# Patient Record
Sex: Male | Born: 1935 | Race: White | Hispanic: No | Smoking: Never smoker
Health system: Southern US, Community
[De-identification: ages and names within clinical notes are randomized; demographics above are authoritative.]

## PROBLEM LIST (undated history)

## (undated) DIAGNOSIS — I639 Cerebral infarction, unspecified: Secondary | ICD-10-CM

## (undated) DIAGNOSIS — F1010 Alcohol abuse, uncomplicated: Secondary | ICD-10-CM

## (undated) DIAGNOSIS — C61 Malignant neoplasm of prostate: Secondary | ICD-10-CM

## (undated) DIAGNOSIS — E7800 Pure hypercholesterolemia, unspecified: Secondary | ICD-10-CM

## (undated) DIAGNOSIS — I4891 Unspecified atrial fibrillation: Secondary | ICD-10-CM

## (undated) DIAGNOSIS — K900 Celiac disease: Secondary | ICD-10-CM

## (undated) DIAGNOSIS — R9389 Abnormal findings on diagnostic imaging of other specified body structures: Secondary | ICD-10-CM

## (undated) DIAGNOSIS — R946 Abnormal results of thyroid function studies: Secondary | ICD-10-CM

## (undated) DIAGNOSIS — L239 Allergic contact dermatitis, unspecified cause: Secondary | ICD-10-CM

## (undated) DIAGNOSIS — M7020 Olecranon bursitis, unspecified elbow: Secondary | ICD-10-CM

## (undated) DIAGNOSIS — K649 Unspecified hemorrhoids: Secondary | ICD-10-CM

## (undated) DIAGNOSIS — D539 Nutritional anemia, unspecified: Secondary | ICD-10-CM

## (undated) DIAGNOSIS — M1990 Unspecified osteoarthritis, unspecified site: Secondary | ICD-10-CM

## (undated) DIAGNOSIS — K5900 Constipation, unspecified: Secondary | ICD-10-CM

## (undated) HISTORY — PX: COLONOSCOPY: SHX174

## (undated) HISTORY — DX: Nutritional anemia, unspecified: D53.9

## (undated) HISTORY — DX: Alcohol abuse, uncomplicated: F10.10

## (undated) HISTORY — DX: Malignant neoplasm of prostate: C61

## (undated) HISTORY — DX: Pure hypercholesterolemia, unspecified: E78.00

## (undated) HISTORY — DX: Celiac disease: K90.0

## (undated) HISTORY — DX: Unspecified hemorrhoids: K64.9

## (undated) HISTORY — DX: Cerebral infarction, unspecified: I63.9

## (undated) HISTORY — DX: Unspecified osteoarthritis, unspecified site: M19.90

## (undated) HISTORY — DX: Unspecified atrial fibrillation: I48.91

## (undated) HISTORY — PX: OTHER SURGICAL HISTORY: SHX169

## (undated) HISTORY — DX: Abnormal results of thyroid function studies: R94.6

## (undated) HISTORY — DX: Allergic contact dermatitis, unspecified cause: L23.9

## (undated) HISTORY — PX: WRIST FRACTURE SURGERY: SHX121

## (undated) HISTORY — DX: Olecranon bursitis, unspecified elbow: M70.20

## (undated) HISTORY — DX: Abnormal findings on diagnostic imaging of other specified body structures: R93.89

---

## 1985-12-20 DIAGNOSIS — I639 Cerebral infarction, unspecified: Secondary | ICD-10-CM

## 1985-12-20 HISTORY — DX: Cerebral infarction, unspecified: I63.9

## 1998-03-24 ENCOUNTER — Encounter (HOSPITAL_COMMUNITY): Admission: RE | Admit: 1998-03-24 | Discharge: 1998-06-22 | Payer: Self-pay | Admitting: Psychiatry

## 1998-10-22 ENCOUNTER — Ambulatory Visit (HOSPITAL_COMMUNITY): Admission: RE | Admit: 1998-10-22 | Discharge: 1998-10-22 | Payer: Self-pay | Admitting: Gastroenterology

## 2002-07-12 ENCOUNTER — Emergency Department (HOSPITAL_COMMUNITY): Admission: EM | Admit: 2002-07-12 | Discharge: 2002-07-12 | Payer: Self-pay | Admitting: Emergency Medicine

## 2004-03-26 ENCOUNTER — Encounter: Payer: Self-pay | Admitting: Gastroenterology

## 2005-04-27 ENCOUNTER — Ambulatory Visit: Payer: Self-pay | Admitting: Pulmonary Disease

## 2005-06-18 ENCOUNTER — Ambulatory Visit: Payer: Self-pay | Admitting: Pulmonary Disease

## 2006-08-02 ENCOUNTER — Ambulatory Visit: Payer: Self-pay | Admitting: Pulmonary Disease

## 2007-07-31 ENCOUNTER — Ambulatory Visit: Payer: Self-pay | Admitting: Pulmonary Disease

## 2007-07-31 LAB — CONVERTED CEMR LAB
ALT: 35 units/L (ref 0–53)
AST: 54 units/L — ABNORMAL HIGH (ref 0–37)
Alkaline Phosphatase: 59 units/L (ref 39–117)
BUN: 11 mg/dL (ref 6–23)
Basophils Relative: 0.4 % (ref 0.0–1.0)
Bilirubin Urine: NEGATIVE
Bilirubin, Direct: 0.2 mg/dL (ref 0.0–0.3)
CO2: 27 meq/L (ref 19–32)
Calcium: 8.7 mg/dL (ref 8.4–10.5)
Chloride: 105 meq/L (ref 96–112)
Eosinophils Relative: 2.3 % (ref 0.0–5.0)
GFR calc Af Amer: 106 mL/min
GFR calc non Af Amer: 88 mL/min
Glucose, Bld: 90 mg/dL (ref 70–99)
Ketones, ur: NEGATIVE mg/dL
LDL Cholesterol: 55 mg/dL (ref 0–99)
Lymphocytes Relative: 39.7 % (ref 12.0–46.0)
Monocytes Relative: 8.6 % (ref 3.0–11.0)
Neutro Abs: 2.8 10*3/uL (ref 1.4–7.7)
Nitrite: NEGATIVE
Platelets: 230 10*3/uL (ref 150–400)
RBC: 3.5 M/uL — ABNORMAL LOW (ref 4.22–5.81)
Specific Gravity, Urine: 1.02 (ref 1.000–1.03)
TSH: 3.32 microintl units/mL (ref 0.35–5.50)
Total CHOL/HDL Ratio: 2.1
Total Protein, Urine: NEGATIVE mg/dL
Triglycerides: 73 mg/dL (ref 0–149)
Urine Glucose: NEGATIVE mg/dL
VLDL: 15 mg/dL (ref 0–40)
WBC: 5.6 10*3/uL (ref 4.5–10.5)
pH: 6 (ref 5.0–8.0)

## 2007-08-03 ENCOUNTER — Ambulatory Visit: Payer: Self-pay | Admitting: Pulmonary Disease

## 2008-11-26 DIAGNOSIS — I6350 Cerebral infarction due to unspecified occlusion or stenosis of unspecified cerebral artery: Secondary | ICD-10-CM | POA: Insufficient documentation

## 2008-11-26 DIAGNOSIS — Z8546 Personal history of malignant neoplasm of prostate: Secondary | ICD-10-CM | POA: Insufficient documentation

## 2008-11-26 DIAGNOSIS — E785 Hyperlipidemia, unspecified: Secondary | ICD-10-CM | POA: Insufficient documentation

## 2008-11-26 DIAGNOSIS — L130 Dermatitis herpetiformis: Secondary | ICD-10-CM | POA: Insufficient documentation

## 2008-11-27 ENCOUNTER — Ambulatory Visit: Payer: Self-pay | Admitting: Pulmonary Disease

## 2008-12-08 LAB — CONVERTED CEMR LAB
AST: 33 units/L (ref 0–37)
Alkaline Phosphatase: 47 units/L (ref 39–117)
Basophils Absolute: 0 10*3/uL (ref 0.0–0.1)
Bilirubin, Direct: 0.1 mg/dL (ref 0.0–0.3)
CO2: 30 meq/L (ref 19–32)
Chloride: 105 meq/L (ref 96–112)
Cholesterol: 152 mg/dL (ref 0–200)
Creatinine, Ser: 0.8 mg/dL (ref 0.4–1.5)
Eosinophils Absolute: 0.1 10*3/uL (ref 0.0–0.7)
GFR calc non Af Amer: 100 mL/min
HDL: 46 mg/dL (ref >39.0)
LDL Cholesterol: 67 mg/dL (ref 0–99)
Lymphocytes Relative: 49.3 % — ABNORMAL HIGH (ref 12.0–46.0)
MCHC: 33.6 g/dL (ref 30.0–36.0)
MCV: 106.2 fL — ABNORMAL HIGH (ref 78.0–100.0)
Neutrophils Relative %: 37.5 % — ABNORMAL LOW (ref 43.0–77.0)
Platelets: 195 10*3/uL (ref 150–400)
Potassium: 4.5 meq/L (ref 3.5–5.1)
RDW: 12.5 % (ref 11.5–14.6)
Sodium: 141 meq/L (ref 135–145)
Total Bilirubin: 0.7 mg/dL (ref 0.3–1.2)
VLDL: 39 mg/dL (ref 0–40)
Vitamin B-12: 306 pg/mL (ref 211–911)

## 2008-12-22 DIAGNOSIS — K649 Unspecified hemorrhoids: Secondary | ICD-10-CM | POA: Insufficient documentation

## 2008-12-22 DIAGNOSIS — M7020 Olecranon bursitis, unspecified elbow: Secondary | ICD-10-CM

## 2008-12-22 DIAGNOSIS — K900 Celiac disease: Secondary | ICD-10-CM

## 2008-12-22 DIAGNOSIS — Z8669 Personal history of other diseases of the nervous system and sense organs: Secondary | ICD-10-CM

## 2008-12-22 DIAGNOSIS — R9389 Abnormal findings on diagnostic imaging of other specified body structures: Secondary | ICD-10-CM | POA: Insufficient documentation

## 2008-12-22 DIAGNOSIS — M1990 Unspecified osteoarthritis, unspecified site: Secondary | ICD-10-CM | POA: Insufficient documentation

## 2008-12-22 DIAGNOSIS — F1010 Alcohol abuse, uncomplicated: Secondary | ICD-10-CM | POA: Insufficient documentation

## 2009-02-26 ENCOUNTER — Encounter (INDEPENDENT_AMBULATORY_CARE_PROVIDER_SITE_OTHER): Payer: Self-pay | Admitting: *Deleted

## 2009-06-26 ENCOUNTER — Emergency Department (HOSPITAL_COMMUNITY): Admission: EM | Admit: 2009-06-26 | Discharge: 2009-06-26 | Payer: Self-pay | Admitting: Orthopaedic Surgery

## 2009-07-02 ENCOUNTER — Emergency Department (HOSPITAL_COMMUNITY): Admission: EM | Admit: 2009-07-02 | Discharge: 2009-07-02 | Payer: Self-pay | Admitting: Emergency Medicine

## 2010-01-26 ENCOUNTER — Telehealth (INDEPENDENT_AMBULATORY_CARE_PROVIDER_SITE_OTHER): Payer: Self-pay | Admitting: *Deleted

## 2010-01-27 ENCOUNTER — Telehealth: Payer: Self-pay | Admitting: Pulmonary Disease

## 2010-01-28 ENCOUNTER — Ambulatory Visit: Payer: Self-pay | Admitting: Pulmonary Disease

## 2010-02-05 ENCOUNTER — Telehealth: Payer: Self-pay | Admitting: Gastroenterology

## 2010-02-06 ENCOUNTER — Ambulatory Visit: Payer: Self-pay | Admitting: Pulmonary Disease

## 2010-02-06 DIAGNOSIS — D539 Nutritional anemia, unspecified: Secondary | ICD-10-CM | POA: Insufficient documentation

## 2010-02-07 LAB — CONVERTED CEMR LAB
ALT: 25 units/L (ref 0–53)
AST: 38 units/L — ABNORMAL HIGH (ref 0–37)
Albumin: 3.8 g/dL (ref 3.5–5.2)
BUN: 16 mg/dL (ref 6–23)
Basophils Relative: 0.5 % (ref 0.0–3.0)
Chloride: 101 meq/L (ref 96–112)
Eosinophils Relative: 2.2 % (ref 0.0–5.0)
Eosinophils Relative: 2.6 % (ref 0.0–5.0)
Folate: 19.4 ng/mL
GFR calc non Af Amer: 99.51 mL/min (ref >60)
Glucose, Bld: 100 mg/dL — ABNORMAL HIGH (ref 70–99)
HCT: 35.3 % — ABNORMAL LOW (ref 39.0–52.0)
Hemoglobin: 10.7 g/dL — ABNORMAL LOW (ref 13.0–17.0)
Hemoglobin: 11.7 g/dL — ABNORMAL LOW (ref 13.0–17.0)
Iron: 155 ug/dL (ref 42–165)
Lymphs Abs: 1.5 10*3/uL (ref 0.7–4.0)
MCV: 108.4 fL — ABNORMAL HIGH (ref 78.0–100.0)
Monocytes Absolute: 0.4 10*3/uL (ref 0.1–1.0)
Monocytes Relative: 9.3 % (ref 3.0–12.0)
Neutro Abs: 2 10*3/uL (ref 1.4–7.7)
Neutro Abs: 1.7 10*3/uL (ref 1.4–7.7)
Neutrophils Relative %: 45.9 % (ref 43.0–77.0)
Potassium: 5 meq/L (ref 3.5–5.1)
RBC: 2.96 M/uL — ABNORMAL LOW (ref 4.22–5.81)
RBC: 3.22 M/uL — ABNORMAL LOW (ref 4.22–5.81)
TSH: 5.94 microintl units/mL — ABNORMAL HIGH (ref 0.35–5.50)
Total CHOL/HDL Ratio: 2
Vitamin B-12: 264 pg/mL (ref 211–911)
WBC: 4.2 10*3/uL — ABNORMAL LOW (ref 4.5–10.5)
WBC: 3.6 10*3/uL — ABNORMAL LOW (ref 4.5–10.5)

## 2010-02-13 ENCOUNTER — Telehealth: Payer: Self-pay | Admitting: Gastroenterology

## 2010-02-13 DIAGNOSIS — D649 Anemia, unspecified: Secondary | ICD-10-CM

## 2010-02-17 ENCOUNTER — Encounter: Payer: Self-pay | Admitting: Nurse Practitioner

## 2010-02-17 ENCOUNTER — Ambulatory Visit: Payer: Self-pay | Admitting: Internal Medicine

## 2010-02-18 ENCOUNTER — Ambulatory Visit: Payer: Self-pay | Admitting: Gastroenterology

## 2010-02-18 LAB — CONVERTED CEMR LAB
Fecal Occult Blood: NEGATIVE
OCCULT 2: NEGATIVE
OCCULT 3: NEGATIVE

## 2010-02-26 LAB — CONVERTED CEMR LAB
Albumin ELP: 59.3 % (ref 55.8–66.1)
Alpha-1-Globulin: 3.7 % (ref 2.9–4.9)

## 2010-03-09 ENCOUNTER — Encounter (INDEPENDENT_AMBULATORY_CARE_PROVIDER_SITE_OTHER): Payer: Self-pay | Admitting: *Deleted

## 2010-03-10 ENCOUNTER — Ambulatory Visit: Payer: Self-pay | Admitting: Gastroenterology

## 2010-03-20 ENCOUNTER — Ambulatory Visit: Payer: Self-pay | Admitting: Gastroenterology

## 2010-03-24 ENCOUNTER — Encounter: Payer: Self-pay | Admitting: Gastroenterology

## 2011-01-19 NOTE — Procedures (Signed)
Summary: Colonoscopy  Patient: [name] Note: All result statuses are Final unless otherwise noted.  Tests: (1) Colonoscopy (COL)   COL Colonoscopy           [facility] [employer].     [place], [place]  [id number]           COLONOSCOPY PROCEDURE REPORT           PATIENT:  [name], [name]  MR#:  [id number]     BIRTHDATE:  1930-01-20, 79 yrs. old  GENDER:  male           ENDOSCOPIST:  Norberto Sorenson T. Fuller Plan, MD, [facility]           PROCEDURE DATE:  03/20/2010     PROCEDURE:  Colonoscopy with snare polypectomy     ASA CLASS:  Class II     INDICATIONS:  1) anemia 2) hx of prostate cancer.           MEDICATIONS:   Fentanyl 100 mcg IV, Versed 10 mg IV, Benadryl 25     mg IV           DESCRIPTION OF PROCEDURE:   After the risks benefits and     alternatives of the procedure were thoroughly explained, informed     consent was obtained.  Digital rectal exam was performed and     revealed no abnormalities.   The LB [id number] endoscope     was introduced through the anus and advanced to the cecum, which     was identified by both the appendix and ileocecal valve, without     limitations.  The quality of the prep was good, using MoviPrep.     The instrument was then slowly withdrawn as the colon was fully     examined.     <<PROCEDUREIMAGES>>           FINDINGS:  A sessile polyp was found in the cecum. It was 5 mm in     size. Polyp was snared without cautery. Retrieval was successful.     A normal appearing ileocecal valve, and appendiceal orifice were     identified. The ascending, hepatic flexure, transverse, splenic     flexure, descending, sigmoid colon, and rectum appeared     unremarkable. There were mucosal changes consistent with radiation     proctitis seen in the distal rectum. They were mild.   Retroflexed     views in the rectum revealed internal hemorrhoids, small and     radiation proctitis.  The time to cecum =  2.5  minutes. The scope  was then withdrawn (time =  10  min) from the patient and the     procedure completed.           COMPLICATIONS:  None           ENDOSCOPIC IMPRESSION:     1) 5 mm sessile polyp in the cecum     2) Radiation proctitis     3) Internal hemorrhoids           RECOMMENDATIONS:     1) Await pathology results     2) If the polyp removed today is adenomatous (pre-cancerous),     you will need a repeat colonoscopy in 5 years if your health     status is appropriate. Otherwise there would be no plans for     future surveillance colonoscopies given your age.  Pricilla Riffle. Fuller Plan, MD, [facility]           CC: Noralee Space, MD           n.     Lorrin MaisPricilla Riffle. Vieno Tarrant at 03/20/2010 04:15 PM           Lennox Grumbles, [phone]  Note: An exclamation mark (!) indicates a result that was not dispersed into the flowsheet. Document Creation Date: 03/20/2010 4:16 PM _______________________________________________________________________  (1) Order result status: Final Collection or observation date-time: 03/20/2010 16:00 Requested date-time:  Receipt date-time:  Reported date-time:  Referring Physician:   Ordering Physician: Lucio Edward [phone]) Specimen Source:  Source: Tawanna Cooler Order Number: [id number] Lab site:

## 2011-01-19 NOTE — Assessment & Plan Note (Signed)
Summary: cpx/fasting/apc   CC:  14 month ROV & review of mult medical problems....  History of Present Illness: 77 y/o WM here for a follow up visit... he has multiple medical problems as noted below...     ~  February 06, 2010:  he states that he has had a good yr but has lost 14# and appeitite diminished ever since he got some false teeth... he had shingles eruption involving the left T10 dermatome about 2 mo ago & was treated at an [facility] w/ ?Famvir & resolved... he requests refill perscriptions and declines Flu vaccine- "I had the last one in 1984"...   ** labs show anemia w/ large cells- & we will eval further >>    Current Problems:   Hx of ECHOCARDIOGRAM, ABNORMAL (ICD-793.2) - prev 2DEcho w/ ? old vegetation of non-coronary cusp of AoV w/ discete non-mobile nodule felt to be a healed vegetation seen on Echo in 1988 by DrHSmith...  he denies CP, palpit, syncope, edema, etc...  ~  last 2DEcho 1998 & read by Benay Spice w/ AoV sclerosis & thickening of the non-coronary cusp, w/o stenosis or AI...  HYPERCHOLESTEROLEMIA (ICD-272.0) - on LIPITOR 61m/d...  ~  [place]/08 showed TChol 133, TG 73, HDL 63, LDL 55... rec- continue Lip20 + diet.  ~  FPar[place]12/09 showed TChol 152, TG 195, HDL 46, LDL 67... rec- same med + better low fat diet!  ~  FLP 2/11 showed TChol 115, TG 134, HDL 61, LDL 27... WOW!!!  ABNORMAL THYROID FUNCTION TESTS (ICD-794.5) - clinically euthyroid w/o complaints.  ~  labs 12/09 showed TSH= 4.82  ~  labs 2/11 showed TSH= 5.94... offered Synthroid Rx but he wants to wait & repeat blood work later.  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 on gluten free diet...   Hx of HEMORRHOIDS (ICD-455.6) - he [facility]um in [facility] revealing radiation proctitis from his seed implants... last colonoscopy 4/05 by DrStark showed some mild, inactive radiation colitis and internal hems... f/u planned  538yr..  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  ~  labs 8/08 showed SGOT= 54  ~  labs 12/09 showed SGOT= 33  ~  labs 2/11 showed SGOT= 38  PROSTATE CANCER, HX OF (ICD-V10.46) - eval and Rx by DrPeterson in 1998 w/ "seeds"... he is checked yearly in the summer months...   ~  labs 8/08 showed PSA= 0.36  ~  labs 12/09 showed PSA= 0.27  ~  labs 2/11 showed PSA= 0.26  DEGENERATIVE JOINT DISEASE (ICD-715.90)  Hx of OLECRANON BURSITIS (ICD-726.33)  CARPAL TUNNEL SYNDROME, HX OF (ICD-V12.49)  CVA (ICD-434.91) - he takes ASA 8133m... had TIA/ CVA [place] in [place] in the 1980's... no recurrent problems since that time...  DERMATITIS, ALLERGIC (ICD-692.9) - hx dermatitis: ?dermatitis herpetiformis vs gluten dermatits followed by DrHouston... pt states that he used to get a "peel" every 3 months... he is taking DAPSONE 100m36m/2 tab daily which he states he has been on for >67yr47yr rets us to[place]orea[place] for him...  ~  2/11: he reported a bout of Shingles  ~12/10 involving left T10[facility].  MACROCYTIC ANEMIA (ICD-281.9) --- he has macrocytosis w/ borderline B12, ?alcohol related vs other?  ~  labs 8/08 showed Hg=  12.5, MCV= 103, B12= 293 (211-911)...  ~  labs 12/09 showed Hg= 12.2, MCV= 106, B12 level 306 (211-911)... advised MVI, folic acid, etc...  ~  labs 2/11 showed Hg= 10.7 (repeat 11.7), MCV= 109, B12= 264, Folate= 19, Fe= 155, SPE/ IEP & colon pending.   **We discussed starting Vit B 12 supplement 10104mg daily...   Allergies (verified): No Known Drug Allergies  Comments:  Nurse/Medical Assistant: The patient's medications and allergies were reviewed with the patient and were updated in the Medication and Allergy Lists.  Past History:  Past Medical History:  Hx of ECHOCARDIOGRAM, ABNORMAL (ICD-793.2) HYPERCHOLESTEROLEMIA (ICD-272.0) ABNORMAL THYROID  FUNCTION TESTS (ICD-794.5) Hx of GLUTEN ENTEROPATHY (ICD-579.0) Hx of HEMORRHOIDS (ICD-455.6) ALCOHOL ABUSE (ICD-305.00) PROSTATE CANCER, HX OF (ICD-V10.46) DEGENERATIVE JOINT DISEASE (ICD-715.90) Hx of OLECRANON BURSITIS (ICD-726.33) CARPAL TUNNEL SYNDROME, HX OF (ICD-V12.49) CVA (ICD-434.91) MACROCYTIC ANEMIA (ICD-281.9) DERMATITIS, ALLERGIC (ICD-692.9)  Past Surgical History: S/P prostate bx & seed implantation S/P fx wrist surg  Family History: Reviewed history from 11/27/2008 and no changes required. Father died age 3574fm old age, hx prostate cancer Mother died age 35511ld age, hx sprue 9 Siblings- 5 69rothers- 3 w/ prostate cancer 4 Sisters- one died w/ dementia  Social History: Reviewed history from 11/27/2008 and no changes required. Divorced Never Smoked regular alcohol consumption: 6 pk/d security for [employer]  Review of Systems       The patient complains of fatigue, malaise, and weight loss.  The patient denies fever, chills, sweats, anorexia, weakness, sleep disorder, blurring, diplopia, eye irritation, eye discharge, vision loss, eye pain, photophobia, earache, ear discharge, tinnitus, decreased hearing, nasal congestion, nosebleeds, sore throat, hoarseness, chest pain, palpitations, syncope, dyspnea on exertion, orthopnea, PND, peripheral edema, cough, dyspnea at rest, excessive sputum, hemoptysis, wheezing, pleurisy, nausea, vomiting, diarrhea, constipation, change in bowel habits, abdominal pain, melena, hematochezia, jaundice, gas/bloating, indigestion/heartburn, dysphagia, odynophagia, dysuria, hematuria, urinary frequency, urinary hesitancy, nocturia, incontinence, back pain, joint pain, joint swelling, muscle cramps, muscle weakness, stiffness, arthritis, sciatica, restless legs, leg pain at night, leg pain with exertion, rash, itching, dryness, suspicious lesions, paralysis, paresthesias, seizures, tremors, vertigo, transient blindness, frequent falls, frequent  headaches, difficulty walking, depression, anxiety, memory loss, confusion, cold intolerance, heat intolerance, polydipsia, polyphagia, polyuria, unusual weight change, abnormal bruising, bleeding, enlarged lymph nodes, urticaria, allergic rash, hay fever, and recurrent infections.    Vital Signs:  Patient profile:   [age]ear old male Height:      73.5 inches Weight:      172.25 pounds BMI:     22.50 O2 Sat:      97 % on Room air Temp:     97.0 degrees F oral Pulse rate:   97 / minute BP sitting:   126 / 66  (left arm) Cuff size:   regular  Vitals Entered By: LeElita BooneMA (February 06, 2010 9:35 AM)  O2 Sat at Rest %:  97 O2 Flow:  Room air CC: 14 month ROV & review of mult medical problems... Is Patient Diabetic? No Pain Assessment Patient in pain? no      Comments no changes in meds   Physical Exam  Additional Exam:  WD, WN, 7741/o WM in NAD... GENERAL:  Alert & oriented; pleasant & cooperative... HEENT:  [place]/AT, EOM-wnl, PERRLA, EACs-clear, TMs-wnl, NOSE-clear, THROAT-clear & wnl. NECK:  Supple w/ fairROM; no JVD; normal carotid impulses w/o bruits; no thyromegaly or nodules palpated; no lymphadenopathy. CHEST:  Clear to P & A; without wheezes/ rales/ or rhonchi. HEART:  Regular Rhythm; without murmurs/ rubs/ or gallops. ABD:  Soft & nontender; normal BS, no organomegaly or masses palpated... EXT: without deformities, mild arthritic changes; no varicose veins/ venous insuffic/ or edema. NEURO:  CN's intact; motor testing normal; sensory testing normal; gait normal & balance OK. DERM:  No lesions noted; no rash etc...    MISC. Report  Procedure date:  02/06/2010  Findings:      CBC Platelet w/Diff (CBCD)   White Cell Count     [L]  4.2 K/uL                    4.5-10.5   Red Cell Count       [L]  2.96 Mil/uL                 4.22-5.81   Hemoglobin           [L]  10.7 g/dL                   13.0-17.0   Hematocrit           [L]  32.0 %                       39.0-52.0   MCV                  [H]  108.4 fl                    78.0-100.0   Platelet Count            166.0 K/uL                  150.0-400.0   Neutrophil %              45.9 %                      43.0-77.0   Lymphocyte %              41.2 %                      12.0-46.0   Monocyte %                10.2 %                      3.0-12.0   Eosinophils%              2.2 %                       0.0-5.0   Basophils %               0.5 %                       0.0-3.0  BMP (METABOL)   Sodium                    137 mEq/L                   135-145   Potassium                 5.0 mEq/L                   3.5-5.1   Chloride  101 mEq/L                   96-112   Carbon Dioxide            30 mEq/L                    19-32   Glucose              [H]  100 mg/dL                   70-99   BUN                       16 mg/dL                    6-23   Creatinine                0.8 mg/dL                   0.4-1.5   Calcium                   9.3 mg/dL                   8.4-10.5   GFR                       99.51 mL/min                >60  Hepatic/Liver Function Panel (HEPATIC)   Total Bilirubin           0.7 mg/dL                   0.3-1.2   Direct Bilirubin          0.2 mg/dL                   0.0-0.3   Alkaline Phosphatase      40 U/L                      39-117   AST                  [H]  38 U/L                      0-37   ALT                       25 U/L                      0-53   Total Protein             7.1 g/dL                    6.0-8.3   Albumin                   3.8 g/dL                    3.5-5.2  Comments:      TSH (TSH)   FastTSH              [H]  5.94 uIU/mL                 0.35-5.50  Prostate Specific  Antigen (PSA)   PSA-Hyb                   0.26 ng/mL                  0.10-4.00  Lipid Panel (LIPID)   Cholesterol               115 mg/dL                   0-200   Triglycerides             134.0 mg/dL                 0.0-149.0   HDL                       60.80 mg/dL                  >39.00   LDL Cholesterol           27 mg/dL                    0-99   Impression & Recommendations:  Problem # 1:  HYPERCHOLESTEROLEMIA (ICD-272.0) His numbers look great on the Lip20... His updated medication list for this problem includes:    Lipitor 20 Mg Tabs (Atorvastatin calcium) .Marland Kitchen... Take 1 tablet by mouth once a day  Problem # 2:  Hx of HEMORRHOIDS (ICD-455.6) He needs Colonoscopy as part of anemia eval & overdue for f/u... we will refer to DrStark...  Problem # 3:  ALCOHOL ABUSE (ICD-305.00) He knows that he must stop all Etoh but he is not motivated to quit his 6pk of beer daily...  Problem # 4:  PROSTATE CANCER, HX OF (ICD-V10.46) Followed by DrPeterson w/ stable PSA...  Problem # 5:  ABNORMAL THYROID FUNCTION TESTS (ICD-794.5) We discussed supplementation w/ Synthroid but he wants to wait & f/u labs in 12mo..  Problem # 6:  MACROCYTIC ANEMIA (ICD-281.9) We discussed additional labs & trial B12 1000 u daily... needs colonoscopy. His updated medication list for this problem includes:    Vitamin B-12 1000 Mcg Tabs (Cyanocobalamin) ..Marland Kitchen.. Take 1 tab by mouth once daily...  Orders: TLB-CBC Platelet - w/Differential (85025-CBCD) TLB-IBC Pnl (Iron/FE;Transferrin) (83550-IBC) TLB-B12 + Folate Pnl ([id number]_82607-B12/FOL) T-SPE w/reflex to IFE (([id number]  Complete Medication List: 1)  Bayer Aspirin 325 Mg Tabs (Aspirin) .... Take 1 tablet by mouth once a day 2)  Lipitor 20 Mg Tabs (Atorvastatin calcium) .... Take 1 tablet by mouth once a day 3)  Dapsone 100 Mg Tabs (Dapsone) .... Take 1/2 tablet by mouth once daily 4)  Mens Multivitamin Plus Tabs (Multiple vitamins-minerals) .... Take 1 tab by mouth once daily..Marland KitchenMarland Kitchen5)  Vitamin B-12 1000 Mcg Tabs (Cyanocobalamin) .... Take 1 tab by mouth once daily...  Other Orders: Prescription Created Electronically ([id number]  Patient Instructions: 1)  Today we updated your med list- see below.... 2)  We refilled your  meds for 2011... 3)  Please start a good multivitamin tab daily, and take 1000 mcg of Vit B12 daily (an OTC Vit B12 supplement pill)... 4)  Today we did some additional anemia blood tests-- please call the "phone tree" in a few days for your lab results..Marland KitchenMarland Kitchen 5)  Add a nutritional supplement like ensure/ boost/ etc daily yo keep from losing more weight... 6)  Call for any problems..Marland KitchenMarland Kitchen7)  Please schedule a follow-up appointment in 6 months.  Prescriptions: DAPSONE 100 MG TABS (DAPSONE) take 1/2 tablet by mouth once daily  #15 x prn   Entered and Authorized by:   Noralee Space MD   Signed by:   Noralee Space MD on 02/06/2010   Method used:   Print then Give to Patient   RxID:   [id number] LIPITOR 20 MG TABS (ATORVASTATIN CALCIUM) Take 1 tablet by mouth once a day  #30 x prn   Entered and Authorized by:   Noralee Space MD   Signed by:   Noralee Space MD on 02/06/2010   Method used:   Print then Give to Patient   RxID:   [id number]

## 2011-01-19 NOTE — Assessment & Plan Note (Signed)
Summary: anemia/sheri   History of Present Illness Visit Type: consult  Primary GI MD: Joylene Igo MD [facility] Primary Provider: Teressa Lower, MD  Requesting Provider: Teressa Lower, MD  Chief Complaint: Anemia and weight loss  History of Present Illness:   [name] is here for evaluation of anemia. He has a history of prostate cancer. Colonoscopy by Dr. Fuller Plan in 2005, done for evaluation of macrocytic anemia, revealed radiation proctitis. [name] drinks four beers a day and has done so for years.  BMs are regular, no black stools. No abdominal pain.  Weight loss of 14 pounds this year which he attributes to denture problems. He takes a full ASA, no other NSAIDS.     GI Review of Systems    Reports weight loss.   Weight loss of 14 pounds over year.   Denies abdominal pain, acid reflux, belching, bloating, chest pain, dysphagia with liquids, dysphagia with solids, heartburn, loss of appetite, vomiting, vomiting blood, and  weight gain.        Denies anal fissure, black tarry stools, change in bowel habit, constipation, diarrhea, diverticulosis, fecal incontinence, heme positive stool, hemorrhoids, irritable bowel syndrome, jaundice, light color stool, liver problems, rectal bleeding, and  rectal pain.    Current Medications (verified): 1)  Bayer Aspirin 325 Mg Tabs (Aspirin) .... Take 1 Tablet By Mouth Once A Day 2)  Lipitor 20 Mg Tabs (Atorvastatin Calcium) .... Take 1 Tablet By Mouth Once A Day 3)  Dapsone 100 Mg Tabs (Dapsone) .... Take 1/2 Tablet By Mouth Once Daily 4)  Mens Multivitamin Plus  Tabs (Multiple Vitamins-Minerals) .... Take 1 Tab By Mouth Once Daily.Marland KitchenMarland Kitchen 5)  Vitamin B-12 1000 Mcg Tabs (Cyanocobalamin) .... Take 1 Tab By Mouth Once Daily.Marland KitchenMarland Kitchen 6)  Ferrous Sulfate 325 (65 Fe) Mg Tbec (Ferrous Sulfate) .... One Tablet By Mouth Once Daily  Allergies (verified): No Known Drug Allergies  Past History:  Past Medical History: Hx of ECHOCARDIOGRAM, ABNORMAL  (ICD-793.2) HYPERCHOLESTEROLEMIA (ICD-272.0) ABNORMAL THYROID FUNCTION TESTS (ICD-794.5) Hx of GLUTEN ENTEROPATHY (ICD-579.0) Hx of HEMORRHOIDS (ICD-455.6) ALCOHOL ABUSE (ICD-305.00) PROSTATE CANCER, HX OF (ICD-V10.46) DEGENERATIVE JOINT DISEASE (ICD-715.90) Hx of OLECRANON BURSITIS (ICD-726.33) CARPAL TUNNEL SYNDROME, HX OF (ICD-V12.49) CVA (ICD-434.91) MACROCYTIC ANEMIA (ICD-281.9) DERMATITIS, ALLERGIC (ICD-692.9)  Past Surgical History: Reviewed history from 02/06/2010 and no changes required. S/P prostate bx & seed implantation S/P fx wrist surg  Family History: Father died age 60fom old age, hx prostate cancer Mother died age 4992old age, hx sprue 9 Siblings- 534Brothers- 3 w/ prostate cancer 4 Sisters- one died w/ dementia No FH of Colon Cancer:  Social History: Occupation: [employer] Divorced 3 childern Never Smoked regular alcohol consumption: 6 pk/daily  Daily Caffeine Use: 2 cups of coffee daily  Illicit Drug Use - no  Review of Systems       The patient complains of skin rash.  The patient denies allergy/sinus, anemia, anxiety-new, arthritis/joint pain, back pain, blood in urine, breast changes/lumps, change in vision, confusion, cough, coughing up blood, depression-new, fainting, fatigue, fever, headaches-new, hearing problems, heart murmur, heart rhythm changes, itching, muscle pains/cramps, night sweats, nosebleeds, shortness of breath, sleeping problems, sore throat, swelling of feet/legs, swollen lymph glands, thirst - excessive, urination - excessive, urination changes/pain, urine leakage, vision changes, and voice change.    Vital Signs:  Patient profile:   77year old male Height:      73.5 inches Weight:      171 pounds BMI:     22.34  BSA:     2.02 Pulse rate:   92 / minute Pulse rhythm:   regular BP sitting:   128 / 72  (left arm) Cuff size:   regular  Vitals Entered By: Hope Pigeon CMA (February 17, 2010 3:29 PM)  Physical  Exam  General:  Well developed, well nourished, no acute distress. Head:  Normocephalic and atraumatic. Eyes:  Conjunctiva pink, no icterus.   Mouth:  No oral lesions. Tongue moist.  Neck:  no obvious masses  Lungs:  Clear throughout to auscultation. Heart:  Regular rate and rhythm; no murmurs, rubs,  or bruits. Abdomen:  Abdomen soft, nontender, nondistended. No obvious masses or hepatomegaly.Normal bowel sounds.  Rectal:  No external or internal masses felt. Soft brown heme negative stool in vault.  Msk:  Symmetrical with no gross deformities. Normal posture. Extremities:  No palmar erythema, no edema.  Neurologic:  Alert and  oriented x4;  grossly normal neurologically. Skin:  Intact without significant lesions or rashes. Cervical Nodes:  No significant cervical adenopathy. Psych:  Alert and cooperative. Normal mood and affect.   Impression & Recommendations:  Problem # 1:  MACROCYTIC ANEMIA (ICD-281.9) Assessment Comment Only Chronic macrocytic anemia for which he had a colonoscopy in 2005. Recent labs reveal low normal B12 level of 264, WBC 3.6, Hgb 11.7, MCV 109,  serum iron 155, TIBC 204.7 with 54.1% saturation, and folate 19.4. Patient has been on iron supplements for years. Hemoccult negatve on my exam today. Macrocytosis possibly secondary to ETOH and / or B12 deficiency. Patient gives a history of celiac disease which, in some cases can cause B12 deficiency. Also, rule out pernicious anemia. The patient's anemia is stable but he  does have a history of prostate cancer treated with seed implants and this puts him at a slightly higher risk for colon cancer. We recommend repeat colonoscopy for surveillance.    Problem # 2:  CELIAC SPRUE (ICD-579.0) Assessment: Comment Only Patient gives a history of celiac disease diagnosed thirty years ago. He isn't extremely diligent about avoiding glutens, probably because he has never had the typical diarrhea / bloating. Will check TTG  and IgA today. Depending on what labs show, patient may benefit from EGD with small bowel biopsy and that could be done at time of EGD.  Orders: T-Tissue Transglutamase Ab IgA [id number]) TLB-Ferritin ([id number])  Problem # 3:  PROSTATE CANCER, HX OF (ICD-V10.46) Assessment: Comment Only Remote history of prostate cancer for which he had seed implants.  Problem # 4:  WEIGHT LOSS-ABNORMAL (ICD-783.21) Assessment: Comment Only Patient attributes to denture problems but hard to believe that have led to 14 pound loss.  Problem # 5:  LEUKOCYTOPENIA UNSPECIFIED (ICD-288.50) Assessment: Comment Only Etiology ?, possibly secondary to Dapsone.  Patient Instructions: 1)  Your physician has requested that you have the following labwork done today: Please go to lab, basement level. 2)    3)  We will call with lab results and to schedule endoscopy.

## 2011-01-19 NOTE — Procedures (Signed)
Summary: [place] COLON   Procedures Next Due Date:    Colonoscopy: 03/2009 Patient Name: [name], [name] MRN:  Procedure Procedures: Colonoscopy CPT: 73668.  Personnel: Endoscopist: Pricilla Riffle. Fuller Plan, MD, Marval Regal.  Exam Location: Exam performed in Outpatient Clinic. Outpatient  Patient Consent: Procedure, Alternatives, Risks and Benefits discussed, consent obtained, from patient. Consent was obtained by the RN.  Indications  Evaluation of: Anemia Macrocytic.  Increased Risk Screening: Personal history of prostate cancer.  History  Current Medications: Patient is not currently taking Coumadin.  Pre-Exam Physical: Performed Mar 26, 2004. Entire physical exam was normal.  Exam Exam: Extent of exam reached: Cecum, extent intended: Cecum.  The cecum was identified by appendiceal orifice and IC valve. Colon retroflexion performed. ASA Classification: II. Tolerance: excellent.  Monitoring: Pulse and BP monitoring, Oximetry used. Supplemental O2 given.  Colon Prep Used Miralax for colon prep. Prep results: good.  Sedation Meds: Patient assessed and found to be appropriate for moderate (conscious) sedation. Fentanyl 125 mcg. given IV. Versed 12 given IV.  Findings NORMAL EXAM: Cecum to Sigmoid Colon.  RADIATION COLITIS: Rectum. radiation proctitis present, Friability: mild. Activity level inactive, ICD9: Colitis, Radiation: 558.2.  HEMORRHOIDS: Internal. Size: Small. Not bleeding. Not thrombosed. ICD9: Hemorrhoids, Internal: 455.0.   Assessment  Diagnoses: 558.2: Colitis, Radiation.  455.0: Hemorrhoids, Internal.   Events  Unplanned Interventions: No intervention was required.  Unplanned Events: There were no complications. Plans Medication Plan: Continue current medications.  Patient Education: Patient given standard instructions for: Hemorrhoids. Radiation proctitis.  Disposition: After procedure patient sent to recovery. After recovery patient sent home.    Scheduling/Referral: Colonoscopy, to [facility] T. Fuller Plan, MD, [facility], around Mar 26, 2009.    cc: Deborra Medina. Lenna Gilford, MD  This report was created from the original endoscopy report, which was reviewed and signed by the above listed endoscopist.

## 2011-01-19 NOTE — Progress Notes (Signed)
Summary: order  Phone Note Call from Patient   Caller: Patient Call For: nadel Summary of Call: pt would like to have labs put in tomorrow or wednesday for cpx Initial call taken by: Gustavus Bryant,  January 26, 2010 11:15 AM  Follow-up for Phone Call        Please advise. Thanks. Iran Planas CMA  January 26, 2010 11:36 AM   pt will have labs done at OV per SN, appt is @ 0930. Parke Poisson CNA  January 26, 2010 11:45 AM   Additional Follow-up for Phone Call Additional follow up Details #1::        [facility] x [place]  January 26, 2010 12:36 PM   called and spoke with pt's spouse and informed her labs will be drawn for pt at time of ov.  spouse verbalized understanding and will relay message to pt. Matthew Folks LPN  January 27, 6811 9:05 AM

## 2011-01-19 NOTE — Letter (Signed)
Summary: [facility] Instructions  [facility]  [place], [place] [address]   Phone: [phone]  Fax: [phone]       [name]    June 15, 1930    MRN: [id number]        Procedure Day Sudie Grumbling:  Wendi Snipes  03/20/10     Arrival Time:  2:30PM      Procedure Time:  3:30PM     Location of Procedure:                    _X _  [facility] (4th Floor)                        [place]   Starting 5 days prior to your procedure 03/15/10 do not eat nuts, seeds, popcorn, corn, beans, peas,  salads, or any raw vegetables.  Do not take any fiber supplements (e.g. Metamucil, Citrucel, and Benefiber).  THE DAY BEFORE YOUR PROCEDURE         DATE: 03/19/10  DAY: THURSDAY  1.  Drink clear liquids the entire day-NO SOLID FOOD  2.  Do not drink anything colored red or purple.  Avoid juices with pulp.  No orange juice.  3.  Drink at least 64 oz. (8 glasses) of fluid/clear liquids during the day to prevent dehydration and help the prep work efficiently.  CLEAR LIQUIDS INCLUDE: Water Jello Ice Popsicles Tea (sugar ok, no milk/cream) Powdered fruit flavored drinks Coffee (sugar ok, no milk/cream) Gatorade Juice: apple, white grape, white cranberry  Lemonade Clear bullion, consomm, broth Carbonated beverages (any kind) Strained chicken noodle soup Hard Candy                             4.  In the morning, mix first dose of MoviPrep solution:    Empty 1 Pouch A and 1 Pouch B into the disposable container    Add lukewarm drinking water to the top line of the container. Mix to dissolve    Refrigerate (mixed solution should be used within 24 hrs)  5.  Begin drinking the prep at 5:00 p.m. The MoviPrep container is divided by 4 marks.   Every 15 minutes drink the solution down to the next mark (approximately 8 oz) until the full liter is complete.   6.  Follow completed prep with 16 oz of clear liquid of your choice  (Nothing red or purple).  Continue to drink clear liquids until bedtime.  7.  Before going to bed, mix second dose of MoviPrep solution:    Empty 1 Pouch A and 1 Pouch B into the disposable container    Add lukewarm drinking water to the top line of the container. Mix to dissolve    Refrigerate  THE DAY OF YOUR PROCEDURE      DATE: 03/20/10  DAY: FRIDAY  Beginning at 10:30AM (5 hours before procedure):         1. Every 15 minutes, drink the solution down to the next mark (approx 8 oz) until the full liter is complete.  2. Follow completed prep with 16 oz. of clear liquid of your choice.    3. You may drink clear liquids until 1:30PM (2 HOURS BEFORE PROCEDURE).   MEDICATION INSTRUCTIONS  Unless otherwise instructed, you should take regular prescription medications with a small sip of water   as early as possible the  morning of your procedure.          OTHER INSTRUCTIONS  You will need a responsible adult at least 75 years of age to accompany you and drive you home.   This person must remain in the waiting room during your procedure.  Wear loose fitting clothing that is easily removed.  Leave jewelry and other valuables at home.  However, you may wish to bring a book to read or  an iPod/MP3 player to listen to music as you wait for your procedure to start.  Remove all body piercing jewelry and leave at home.  Total time from sign-in until discharge is approximately 2-3 hours.  You should go home directly after your procedure and rest.  You can resume normal activities the  day after your procedure.  The day of your procedure you should not:   Drive   Make legal decisions   Operate machinery   Drink alcohol   Return to work  You will receive specific instructions about eating, activities and medications before you leave.    The above instructions have been reviewed and explained to me by  Emerson Monte RN  March 10, 2010 4:50 PM     I fully understand  and can verbalize these instructions _____________________________ Date _________

## 2011-01-19 NOTE — Progress Notes (Signed)
Summary: Schedule Colonoscopy  Phone Note Outgoing Call   Call placed by: Bernita Buffy CMA Deborra Medina),  February 05, 2010 5:00 PM Call placed to: Patient Summary of Call: patient is due for a his colonoscopy I have asked him to schedule his recall but he would like to have his yearly appt with Dr. Lenna Gilford tomorrow and discuss it with him and call our office back. If Dr. Lenna Gilford would like for him to still have the procedure scheduled.  Initial call taken by: Bernita Buffy CMA ([place]),  February 05, 2010 5:02 PM

## 2011-01-19 NOTE — Miscellaneous (Signed)
Summary: [facility] Previsit/prep  Clinical Lists Changes  Medications: Added new medication of MOVIPREP 100 GM  SOLR (PEG-KCL-NACL-NASULF-NA ASC-C) As per prep instructions. - Signed Rx of MOVIPREP 100 GM  SOLR (PEG-KCL-NACL-NASULF-NA ASC-C) As per prep instructions.;  #1 x 0;  Signed;  Entered by: Emerson Monte RN;  Authorized by: Ladene Artist MD [facility];  Method used: Electronically to [facility]. #[id number]*, [address] [place], [place], [place]  [address], Ph: [phone], Fax: [phone] Observations: Added new observation of NKA: T (03/10/2010 16:28)    Prescriptions: MOVIPREP 100 GM  SOLR (PEG-KCL-NACL-NASULF-NA ASC-C) As per prep instructions.  #1 x 0   Entered by:   Emerson Monte RN   Authorized by:   Ladene Artist MD [facility]   Signed by:   Emerson Monte RN on 03/10/2010   Method used:   Electronically to        [facility]. [phone]* (retail)       [place].       [place], [place]  [address]       Ph: [phone]       Fax: [phone]   RxID:   [id number]

## 2011-01-19 NOTE — Letter (Signed)
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 These types of polyps are NOT pre-cancerous.  Should you develop new or worsening symptoms of abdominal pain, bowel habit changes or bleeding from the rectum or bowels, please schedule an evaluation with either your primary care physician or with me.  No further action with gastroenterology is needed at this time.      Please follow-up with your primary care physician for your other      healthcare needs.  Continue treatment plan as outlined the day of your exam.  Please call us if you are having persistent problems or have questions about your condition that have not been fully answered at this time.  Sincerely,  Ladene Artist MD [facility]  This letter has been electronically signed by your physician.  Appended Document: Patient Notice-Hyperplastic Polyps letter mailed 4.7.11

## 2011-01-19 NOTE — Progress Notes (Signed)
Summary: requests labs set up now  Phone Note Call from Patient Call back at Home Phone [phone]   Caller: Patient Call For: NADEL Summary of Call: pt wants labs drawn immediately - for cpx- says that he didn't have labs done before his visit last time, and the results weren't available a wk later at time of his ov. doesn't want this to happen again.  Initial call taken by: Cooper Render, CNA,  January 27, 2010 11:03 AM  Follow-up for Phone Call        I have already spoken with pt regarding SN's recs to wait to have labs drawn until his ov on 02-06-2010 but pt refuses.  Please advise.  Jinny Blossom Reynolds LPN  January 27, 2978 11:06 AM   Additional Follow-up for Phone Call Additional follow up Details #1::         ok for him to come in for lip--272.0/cbcd-285.9/bmp-401.9/hepat-790.5/psa-v76.44/tsh-244.,9.  thanks Franklin  January 27, 2010 11:13 AM   labs placed pt aware.  Bing CMA  January 27, 2010 11:19 AM

## 2011-01-19 NOTE — Progress Notes (Signed)
Summary: Anemia-appt sooner  Phone Note From Other Clinic   Caller: Rhonda @ Dr Lenna Gilford Call For: Dr Fuller Plan Summary of Call: Would like pt seen sooner than next available on 03-10-10 for anemia. Advised nurse is gone for the day. Says it is ok to call back Rhonda [phone] on Monday. Initial call taken by: Irwin Brakeman [facility],  February 13, 2010 4:50 PM  Follow-up for Phone Call        Patient  is scheduled for NP3 with Tye Savoy RNP for 02-17-10 10:30 Follow-up by: Barb Merino RN, CGRN,  February 16, 2010 10:08 AM  New Problems: ANEMIA (ICD-285.9)   New Problems: ANEMIA (ICD-285.9)

## 2011-03-28 LAB — POCT I-STAT, CHEM 8
BUN: 7 mg/dL (ref 6–23)
Calcium, Ion: 1.19 mmol/L (ref 1.12–1.32)
Creatinine, Ser: 0.9 mg/dL (ref 0.4–1.5)
Glucose, Bld: 94 mg/dL (ref 70–99)
TCO2: 24 mmol/L (ref 0–100)

## 2011-03-28 LAB — URINALYSIS, ROUTINE W REFLEX MICROSCOPIC
Glucose, UA: NEGATIVE mg/dL
Nitrite: NEGATIVE
Specific Gravity, Urine: 1.007 (ref 1.005–1.030)
pH: 6.5 (ref 5.0–8.0)

## 2011-03-28 LAB — POCT CARDIAC MARKERS: Troponin i, poc: <0.05 ng/mL (ref 0.00–0.09)

## 2011-03-28 LAB — PROTIME-INR: INR: 1 (ref 0.00–1.49)

## 2011-03-28 LAB — DIFFERENTIAL
Basophils Relative: 0 % (ref 0–1)
Eosinophils Absolute: 0.1 10*3/uL (ref 0.0–0.7)
Eosinophils Relative: 1 % (ref 0–5)
Monocytes Absolute: 0.5 10*3/uL (ref 0.1–1.0)
Monocytes Relative: 9 % (ref 3–12)
Neutrophils Relative %: 65 % (ref 43–77)

## 2011-03-28 LAB — CBC
HCT: 33.3 % — ABNORMAL LOW (ref 39.0–52.0)
MCHC: 33.8 g/dL (ref 30.0–36.0)
MCV: 108.2 fL — ABNORMAL HIGH (ref 78.0–100.0)
RBC: 3.08 MIL/uL — ABNORMAL LOW (ref 4.22–5.81)

## 2011-04-06 ENCOUNTER — Other Ambulatory Visit: Payer: Self-pay | Admitting: Pulmonary Disease

## 2012-04-17 ENCOUNTER — Other Ambulatory Visit: Payer: Self-pay | Admitting: Pulmonary Disease

## 2012-04-21 ENCOUNTER — Telehealth: Payer: Self-pay | Admitting: Pulmonary Disease

## 2012-04-21 NOTE — Telephone Encounter (Signed)
Pt is requesting refill on lipitor and dapsone. Pt last OV 02/06/10 and scheduled an apt on 06/07/12 for follow up. These medications were last refilled on 04/06/11. Please advise SN if okay to refill thanks

## 2012-04-21 NOTE — Telephone Encounter (Signed)
LMOMTCB x 1 

## 2012-04-21 NOTE — Telephone Encounter (Signed)
Per SN---ok to fill the lipitor for #30 with 1 refill and the dapsone #15  With 1 refill.  Pt will need to keep appt with SN in June for further refills. thanks

## 2012-04-24 NOTE — Telephone Encounter (Signed)
Left msg with family member for pt to call back

## 2012-04-25 MED ORDER — ATORVASTATIN CALCIUM 20 MG PO TABS: 20.0000 mg | ORAL_TABLET | Freq: Every day | ORAL | Status: DC

## 2012-04-25 MED ORDER — DAPSONE 100 MG PO TABS: ORAL_TABLET | ORAL | Status: DC

## 2012-04-25 NOTE — Telephone Encounter (Signed)
Lm tcb w/ family member tcb

## 2012-04-25 NOTE — Telephone Encounter (Signed)
I spoke with pt and is aware he needs to keep OV for further refills. RX has been sent to the [facility]. Nothing further was needed

## 2012-05-31 ENCOUNTER — Other Ambulatory Visit: Payer: Self-pay | Admitting: Pulmonary Disease

## 2012-05-31 ENCOUNTER — Telehealth: Payer: Self-pay | Admitting: Pulmonary Disease

## 2012-05-31 DIAGNOSIS — E7800 Pure hypercholesterolemia, unspecified: Secondary | ICD-10-CM

## 2012-05-31 DIAGNOSIS — R946 Abnormal results of thyroid function studies: Secondary | ICD-10-CM

## 2012-05-31 DIAGNOSIS — Z8546 Personal history of malignant neoplasm of prostate: Secondary | ICD-10-CM

## 2012-05-31 DIAGNOSIS — D649 Anemia, unspecified: Secondary | ICD-10-CM

## 2012-05-31 DIAGNOSIS — R634 Abnormal weight loss: Secondary | ICD-10-CM

## 2012-05-31 DIAGNOSIS — D539 Nutritional anemia, unspecified: Secondary | ICD-10-CM

## 2012-05-31 NOTE — Telephone Encounter (Signed)
Orders have been placed in the computer for the pt.  Called and lmom to make pt aware that these orders have been placed in the computer.  Pt to call back for any questions.

## 2012-05-31 NOTE — Telephone Encounter (Signed)
Dr. Lenna Gilford, pls advise on labs pt needs to have done.  Thank you.

## 2012-06-02 ENCOUNTER — Other Ambulatory Visit (INDEPENDENT_AMBULATORY_CARE_PROVIDER_SITE_OTHER): Payer: Medicare Other

## 2012-06-02 DIAGNOSIS — Z8546 Personal history of malignant neoplasm of prostate: Secondary | ICD-10-CM

## 2012-06-02 DIAGNOSIS — D539 Nutritional anemia, unspecified: Secondary | ICD-10-CM

## 2012-06-02 DIAGNOSIS — R946 Abnormal results of thyroid function studies: Secondary | ICD-10-CM | POA: Diagnosis not present

## 2012-06-02 DIAGNOSIS — E7800 Pure hypercholesterolemia, unspecified: Secondary | ICD-10-CM

## 2012-06-02 DIAGNOSIS — D649 Anemia, unspecified: Secondary | ICD-10-CM | POA: Diagnosis not present

## 2012-06-02 LAB — LIPID PANEL
Cholesterol: 114 mg/dL (ref 0–200)
HDL: 75.3 mg/dL (ref >39.00)
VLDL: 7.8 mg/dL (ref 0.0–40.0)

## 2012-06-02 LAB — TSH: TSH: 6.69 u[IU]/mL — ABNORMAL HIGH (ref 0.35–5.50)

## 2012-06-02 LAB — BASIC METABOLIC PANEL
BUN: 17 mg/dL (ref 6–23)
CO2: 28 mEq/L (ref 19–32)
Calcium: 9.1 mg/dL (ref 8.4–10.5)
GFR: 97.51 mL/min (ref >60.00)
Glucose, Bld: 99 mg/dL (ref 70–99)
Potassium: 4.8 mEq/L (ref 3.5–5.1)

## 2012-06-02 LAB — CBC WITH DIFFERENTIAL/PLATELET
Basophils Absolute: 0 10*3/uL (ref 0.0–0.1)
Eosinophils Relative: 1.1 % (ref 0.0–5.0)
Lymphs Abs: 1.7 10*3/uL (ref 0.7–4.0)
Monocytes Relative: 11.5 % (ref 3.0–12.0)
Neutrophils Relative %: 47.3 % (ref 43.0–77.0)
Platelets: 136 10*3/uL — ABNORMAL LOW (ref 150.0–400.0)
RDW: 14.5 % (ref 11.5–14.6)
WBC: 4.3 10*3/uL — ABNORMAL LOW (ref 4.5–10.5)

## 2012-06-02 LAB — HEPATIC FUNCTION PANEL
ALT: 24 U/L (ref 0–53)
AST: 43 U/L — ABNORMAL HIGH (ref 0–37)
Albumin: 3.8 g/dL (ref 3.5–5.2)
Alkaline Phosphatase: 41 U/L (ref 39–117)
Total Protein: 6.4 g/dL (ref 6.0–8.3)

## 2012-06-02 LAB — PSA: PSA: 0.33 ng/mL (ref 0.10–4.00)

## 2012-06-07 ENCOUNTER — Ambulatory Visit (INDEPENDENT_AMBULATORY_CARE_PROVIDER_SITE_OTHER): Payer: Medicare Other | Admitting: Pulmonary Disease

## 2012-06-07 ENCOUNTER — Encounter: Payer: Self-pay | Admitting: Pulmonary Disease

## 2012-06-07 VITALS — BP 122/58 | HR 43 | Temp 96.8°F | Ht 73.5 in | Wt 161.6 lb

## 2012-06-07 DIAGNOSIS — I6350 Cerebral infarction due to unspecified occlusion or stenosis of unspecified cerebral artery: Secondary | ICD-10-CM | POA: Diagnosis not present

## 2012-06-07 DIAGNOSIS — E7800 Pure hypercholesterolemia, unspecified: Secondary | ICD-10-CM

## 2012-06-07 DIAGNOSIS — K900 Celiac disease: Secondary | ICD-10-CM

## 2012-06-07 DIAGNOSIS — Z8546 Personal history of malignant neoplasm of prostate: Secondary | ICD-10-CM

## 2012-06-07 DIAGNOSIS — Z23 Encounter for immunization: Secondary | ICD-10-CM

## 2012-06-07 DIAGNOSIS — R946 Abnormal results of thyroid function studies: Secondary | ICD-10-CM

## 2012-06-07 DIAGNOSIS — K649 Unspecified hemorrhoids: Secondary | ICD-10-CM

## 2012-06-07 DIAGNOSIS — M1990 Unspecified osteoarthritis, unspecified site: Secondary | ICD-10-CM

## 2012-06-07 DIAGNOSIS — L259 Unspecified contact dermatitis, unspecified cause: Secondary | ICD-10-CM

## 2012-06-07 DIAGNOSIS — D539 Nutritional anemia, unspecified: Secondary | ICD-10-CM

## 2012-06-07 MED ORDER — DAPSONE 100 MG PO TABS: ORAL_TABLET | ORAL | Status: AC

## 2012-06-07 MED ORDER — LEVOTHYROXINE SODIUM 50 MCG PO TABS: 50.0000 ug | ORAL_TABLET | Freq: Every day | ORAL | Status: DC

## 2012-06-07 MED ORDER — ATORVASTATIN CALCIUM 20 MG PO TABS: 20.0000 mg | ORAL_TABLET | Freq: Every day | ORAL | Status: DC

## 2012-06-07 NOTE — Patient Instructions (Addendum)
Today we updated your med list in our EPIC system...    Continue your current medications the same...  We decided to add SYNTHROID (Levothyroid) 69mg- one tab daily  For your Thyroid...  We also gave you a PNEUMONIA vaccine today (one shot is all you need)...  We need to watch your blood count closely...    Please cut back on the beer...    Add an OTC Vit B12 pill daily ~10049m daily...    Let's plan a follow up blood count & B12 level in 6 months...  Call for any problems...Marland KitchenMarland Kitchen

## 2012-06-10 ENCOUNTER — Encounter: Payer: Self-pay | Admitting: Pulmonary Disease

## 2012-06-10 NOTE — Progress Notes (Signed)
Subjective:     Patient ID: [name], male   DOB: Jul 27, 1930, 78 y.o.   MRN: [id number]  HPI 78 y/o WM here for a follow up visit... he has multiple medical problems as noted below...    ~  February 06, 2010:  he states that he has had a good yr but has lost 14# and appeitite diminished ever since he got some false teeth... he had shingles eruption involving the left T10 dermatome about 2 mo ago & was treated at an [facility] w/ ?Famvir & resolved... he requests refill perscriptions and declines Flu vaccine- "I had the last one in 1984"...  labs show anemia w/ large cells- & we will eval further >> MCV=109, B12= 264 (211-911), Folate= 5.4 (norm>5.4), SPE w/ polyclonal incr (no M-spike), Ferritin= 61, stool hemoccults negx6... 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  ~  June 07, 2012:  >2year RJamesvillesays he's doing well w/o new complaints or concerns;  He still works sLandat [employer]  We decided to give him a Pneumovax today as he is unsure of prev vaccination... Chol> on Lip20;  FLP 6/13 showed TChol 114, TG 39, HDL 75, LDL 31 Abn TFT> he remains clinically euthyroid but TSH has slowly increased over time; TSH= 6.69 today 7 we decided to start Synthroid577m/d... GI> proctitis, polyps, hems> hx prostate seed implants for prostate ca & colonoscopies w/ radiation proctitis, int hems, & 52m83myperplastic polyp removed 4/11 colonoscopy by drStark... ?Hx Gluten intolerance> +FamHx of Sprue he tells me & he states he had a pos biopsy 1990 by DrWeissman; he does well on Gluten free diet, but he is not very strict and when Tissue Transglutaminase Antibody was checked 2011 it was normal... ~  He remains on his Gluten modified diet & he is doing well by his hx... Etoh> he still drinks ~6pk daily; in light of his macrocytic anemia he was asked to decrease his consumption... Hx Prostate Cancer> Diagn[place] & [place]; PSA= 0.33  (essent no change over several yrs); he is voiding satis 7 denies urinary symptoms... DJD> doing satis w/o specific problems; he uses OTC analgesics whenever nec... Hx CVA> this occurred in the 1980's & resolved w/o residual deficits; he takes ASA 3252m44mhe says... Macrocytic Anemia> CBCs show Hg 11-12 and MCV= 109, and B12 has been borderline low ~290; he was instructed to take B12 1000mc53mily but he never got it; we reviewed his current blood work 7 asked to start the OTC B12 1000mcg752m day... 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 We decided to start low dose synthroid 50mcg/d76m  Problem List:    Hx of ECHOCARDIOGRAM, ABNORMAL (ICD-793.2) - prev 2DEcho w/ ? old vegetation of non-coronary cusp of AoV w/ discete non-mobile nodule felt to be a healed vegetation seen on Echo in 1988 by DrHSmithWrangelldenies CP, palpit,  syncope, edema, etc... ~  last 2DEcho 1998 & read by Benay Spice w/ AoV sclerosis & thickening of the non-coronary cusp, w/o stenosis or AI...  HYPERCHOLESTEROLEMIA (ICD-272.0) - on LIPITOR 27m/d... ~  FB[place]8/08 showed TChol 133, TG 73, HDL 63, LDL 55... rec- continue Lip20 + diet. ~  [place]/09 showed TChol 152, TG 195, HDL 46, LDL 67... rec- same med + better low fat diet! ~  FLP 2/11 showed TChol 115, TG 134, HDL 61, LDL 27... WOW!!! ~  [place]/13 on Lip20 showed TChol 114, TG 39, HDL 75, LDL 31  ABNORMAL THYROID FUNCTION TESTS (ICD-794.5) - clinically euthyroid w/o  complaints. ~  labs 12/09 showed TSH= 4.82 ~  labs 2/11 showed TSH= 5.94... offered Synthroid Rx but he wants to wait & repeat blood work later. ~  Labs 6/13 showed TSH= 6.69... Asked to start Synthroid 584m/d...  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 ~  He remains on his Gluten modified diet & he is doing well by his hx...  Hx of HEMORRHOIDS (ICD-455.6) - [facility] revealing radiation proctitis from his seed implants...  ~  colonoscopy 4/05 by DrStark showed some mild, inactive radiation colitis and internal hems... f/u planned 5y13yr. ~  Colonoscopy 4/11 by DrStark showed 5mm26mlyp (hyperplastic), +radiation proctitis, int hems...  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 sMarland Kitchenill drinks a 6pk of beer nightly... ~  labs 8/08 showed SGOT= 54 ~  labs 12/09 showed SGOT= 33 ~  labs 2/11 showed SGOT= 38 ~  Labs 6/13 showed SGOT= 43  PROSTATE CANCER, HX OF (ICD-V10.46) - eval and Rx by DrPeterson in 1998 w/ "seeds"... he is checked yearly in the summer months...  ~  labs 8/08 showed PSA= 0.36 ~  labs 12/09 showed PSA= 0.27 ~  labs 2/11 showed PSA= 0.26 ~  Labs 6/13 showed PSA= 0.33  DEGENERATIVE JOINT DISEASE (ICD-715.90)  Hx of OLECRANON BURSITIS (ICD-726.33)  CARPAL TUNNEL SYNDROME, HX OF (ICD-V12.49)  CVA (ICD-434.91) - he takes ASA 81mg72m. had TIA/ CVA [place] in [place] in the 1980's... no recurrent problems since that time...  DERMATITIS, ALLERGIC (ICD-692.9) - hx dermatitis: ?dermatitis herpetiformis vs gluten dermatits followed by DrHouston... pt states that he used to get a "peel" every 3 months... he is taking DAPSONE 100mg-10m tab daily which he states he has been on  for >34yrs 57yrets us to rKoreaill for him... ~  2/11: he reported a bout of Shingles ~12/10 involving left T10, [facility] & [facility].  MACROCYTIC ANEMIA (ICD-281.9) --- he has macrocytosis w/ borderline B12, ?alcohol related vs other? ~  labs 8/08 showed Hg= 12.5, MCV= 103, B12= 293 (211-911)... ~  labs 12/09 showed Hg= 12.2, MCV= 106, B12 level 306 (211-911)... advised MVI, folic acid, etc... ~  labs 2/11 showed Hg= 10.7 (repeat 11.7), MCV= 109, B12= 264, Folate= 19, Fe= 155, SPE/ IEP & colon pending. ~  2/11: We discussed starting Vit B 12 supplement 1000mcg d49m==> he never got this... ~  Labs 6/13 showed Hg= 10.8, MCV= 110... Pt is asked to start the B12 1000mcg/d 53m..   Past Surgical History  Procedure Date  . Prostate biopsy and see implantation   . Wrist fracture surgery     Outpatient Encounter Prescriptions as of 06/07/2012  Medication Sig Dispense Refill  . aspirin 325 MG EC tablet Take 325 mg by mouth daily.      Marland Kitchen atorvastatin (LIPITOR) 20 MG tablet Take 1 tablet (20 mg total) by mouth daily.  90 tablet  3  . cyanocobalamin 1000 MCG tablet Take 100 mcg by mouth daily.      . dapsone 100 MG tablet 1/2 tablet by mouth once a day  45 tablet  3  . levothyroxine (SYNTHROID, LEVOTHROID) 50 MCG tablet Take 1 tablet (50 mcg total) by mouth daily.  90 tablet  3  . DISCONTD: atorvastatin (LIPITOR) 20 MG tablet Take 1 tablet (20 mg total) by mouth daily.  30 tablet  1  . DISCONTD: dapsone 100 MG tablet 1/2 tablet by mouth once a day  15 tablet  1    No Known Allergies   Current Medications, Allergies, Past Medical History, Past Surgical History, Family History, and Social History were reviewed in [employer] record.   Review of Systems        The patient complains of fatigue, malaise, and weight loss.  The patient denies fever, chills, sweats, anorexia, weakness, sleep disorder, blurring, diplopia, eye irritation, eye discharge, vision  loss, eye pain, photophobia, earache, ear discharge, tinnitus, decreased hearing, nasal congestion, nosebleeds, sore throat, hoarseness, chest pain, palpitations, syncope, dyspnea on exertion, orthopnea, PND, peripheral edema, cough, dyspnea at rest, excessive sputum, hemoptysis, wheezing, pleurisy, nausea, vomiting, diarrhea, constipation, change in bowel habits, abdominal pain, melena, hematochezia, jaundice, gas/bloating, indigestion/heartburn, dysphagia, odynophagia, dysuria, hematuria, urinary frequency, urinary hesitancy, nocturia, incontinence, back pain, joint pain, joint swelling, muscle cramps, muscle weakness, stiffness, arthritis, sciatica, restless legs, leg pain at night, leg pain with exertion, rash, itching, dryness, suspicious lesions, paralysis, paresthesias, seizures, tremors, vertigo, transient blindness, frequent falls, frequent headaches, difficulty walking, depression, anxiety, memory loss, confusion, cold intolerance, heat intolerance, polydipsia, polyphagia, polyuria, unusual weight change, abnormal bruising, bleeding, enlarged lymph nodes, urticaria, allergic rash, hay fever, and recurrent infections.     Objective:   Physical Exam     WD, WN, 78 y/o WM in NAD... GENERAL:  Alert & oriented; pleasant & cooperative... HEENT:  [place]/AT, EOM-wnl, PERRLA, EACs-clear, TMs-wnl, NOSE-clear, THROAT-clear & wnl. NECK:  Supple w/ fairROM; no JVD; normal carotid impulses w/o bruits; no thyromegaly or nodules palpated; no lymphadenopathy. CHEST:  Clear to P & A; without wheezes/ rales/ or rhonchi. HEART:  Regular Rhythm; without murmurs/ rubs/ or gallops. ABD:  Soft & nontender; normal BS, no organomegaly or masses palpated... EXT: without deformities, mild arthritic changes; no varicose veins/ venous insuffic/ or edema. NEURO:  CN's intact; motor testing normal; sensory testing normal; gait normal & balance OK. DERM:  No lesions noted; no rash etc...  RADIOLOGY DATA:  Reviewed in the EPIC  EMR & discussed w/ the patient...  LABORATORY DATA:  Reviewed in the EPIC EMR & discussed w/ the patient...   Assessment:     CHOL>  On Lip20 w/ good control, continue same...  Hypothyroid>  TSH has drifted hight yet (6.69) & we decided to start low dose Syntroid 47mg/d...  GI> proctitis, polyps, hems>  Last colonoscopy 4/11 reviewed; he denies abd pain, n/v, c/d, or blood seen...  ?Gluten enteropathy>  As above; he does better on gluten restricted diet but is not very strict...  Etoh>  Still drinks 6pk daily;  SGOT=43 & he is asked to cut back...  Hx Prostate cancer>  Treated in 1998 w/ radium seeds and his PSA has been stable = 0.33 currently...  DJD>  Aware, he is minimally symptomatic noted mostly after a long shift at work...  Hx CVA>  This occurred 1980 7 resolved w/o any residuals, no recurrence, on ASA 34m/d...  Macrocytic anemia, mild pancytopenia>  He has mild pancytopenia & MCV=109; B12 levels in the past ~290 & he is asked (again) to take 10024m/d OTC supplement...  Dermatitis>  On Dapsone x yrs 7 he states that rash returns if he stops it, therefore refilled per request...     Plan:     Patient's Medications  New Prescriptions   LEVOTHYROXINE (SYNTHROID, LEVOTHROID) 50 MCG TABLET    Take 1 tablet (50 mcg total) by mouth daily.  Previous Medications   ASPIRIN 325 MG EC TABLET    Take 325 mg by mouth daily.   CYANOCOBALAMIN 1000 MCG TABLET    Take 100 mcg by mouth daily.  Modified Medications   Modified Medication Previous Medication   ATORVASTATIN (LIPITOR) 20 MG TABLET atorvastatin (LIPITOR) 20 MG tablet      Take 1 tablet (20 mg total) by mouth daily.    Take 1 tablet (20 mg total) by mouth daily.   DAPSONE 100 MG TABLET dapsone 100 MG tablet      1/2 tablet by mouth once a day    1/2 tablet by mouth once a day  Discontinued Medications   No medications on file

## 2012-08-07 DIAGNOSIS — Z961 Presence of intraocular lens: Secondary | ICD-10-CM | POA: Diagnosis not present

## 2013-03-23 DIAGNOSIS — Z8546 Personal history of malignant neoplasm of prostate: Secondary | ICD-10-CM | POA: Diagnosis not present

## 2013-03-23 DIAGNOSIS — N320 Bladder-neck obstruction: Secondary | ICD-10-CM | POA: Diagnosis not present

## 2013-04-27 DIAGNOSIS — L578 Other skin changes due to chronic exposure to nonionizing radiation: Secondary | ICD-10-CM | POA: Diagnosis not present

## 2013-04-27 DIAGNOSIS — Z85828 Personal history of other malignant neoplasm of skin: Secondary | ICD-10-CM | POA: Diagnosis not present

## 2013-04-27 DIAGNOSIS — L570 Actinic keratosis: Secondary | ICD-10-CM | POA: Diagnosis not present

## 2013-06-25 ENCOUNTER — Other Ambulatory Visit: Payer: Self-pay | Admitting: Pulmonary Disease

## 2013-07-02 ENCOUNTER — Telehealth: Payer: Self-pay | Admitting: Pulmonary Disease

## 2013-07-02 DIAGNOSIS — R946 Abnormal results of thyroid function studies: Secondary | ICD-10-CM

## 2013-07-02 DIAGNOSIS — Z8546 Personal history of malignant neoplasm of prostate: Secondary | ICD-10-CM

## 2013-07-02 DIAGNOSIS — E7800 Pure hypercholesterolemia, unspecified: Secondary | ICD-10-CM

## 2013-07-02 DIAGNOSIS — D649 Anemia, unspecified: Secondary | ICD-10-CM

## 2013-07-02 DIAGNOSIS — F1010 Alcohol abuse, uncomplicated: Secondary | ICD-10-CM

## 2013-07-02 MED ORDER — LEVOTHYROXINE SODIUM 50 MCG PO TABS: 50.0000 ug | ORAL_TABLET | Freq: Every day | ORAL | Status: DC

## 2013-07-02 MED ORDER — DAPSONE 100 MG PO TABS: 50.0000 mg | ORAL_TABLET | Freq: Every day | ORAL | Status: DC

## 2013-07-02 MED ORDER — ATORVASTATIN CALCIUM 20 MG PO TABS: 20.0000 mg | ORAL_TABLET | Freq: Every day | ORAL | Status: DC

## 2013-07-02 NOTE — Telephone Encounter (Signed)
Per SN: pt needs FLP, BMET, Liver panel, CBC with Diff, TSH, and PSA.  -----  Labs placed.  Lm with family member to have pt call office back to inform him labs are in.  Pt will need to be fasting for these.

## 2013-07-02 NOTE — Telephone Encounter (Signed)
I spoke with the pt and advised according to chart he needs OV set for refills. OV set for 07-20-13. Refills sent. Pt also requests that labs be ordered so he can get them prior to appt. Please advise on labs. Klukwan Bing, CMA

## 2013-07-03 NOTE — Telephone Encounter (Signed)
lmtcb x2 w/ FM

## 2013-07-04 ENCOUNTER — Other Ambulatory Visit: Payer: Self-pay | Admitting: Pulmonary Disease

## 2013-07-04 MED ORDER — DAPSONE 100 MG PO TABS: 50.0000 mg | ORAL_TABLET | Freq: Every day | ORAL | Status: DC

## 2013-07-04 MED ORDER — LEVOTHYROXINE SODIUM 50 MCG PO TABS: 50.0000 ug | ORAL_TABLET | Freq: Every day | ORAL | Status: DC

## 2013-07-04 MED ORDER — ATORVASTATIN CALCIUM 20 MG PO TABS: 20.0000 mg | ORAL_TABLET | Freq: Every day | ORAL | Status: DC

## 2013-07-04 NOTE — Telephone Encounter (Signed)
Called, spoke with pt.  Informed him of below.  He verbalized understanding and voiced no further questions or concerns at this time.

## 2013-07-13 ENCOUNTER — Other Ambulatory Visit (INDEPENDENT_AMBULATORY_CARE_PROVIDER_SITE_OTHER): Payer: Medicare Other

## 2013-07-13 DIAGNOSIS — F1010 Alcohol abuse, uncomplicated: Secondary | ICD-10-CM

## 2013-07-13 DIAGNOSIS — D649 Anemia, unspecified: Secondary | ICD-10-CM

## 2013-07-13 DIAGNOSIS — E7800 Pure hypercholesterolemia, unspecified: Secondary | ICD-10-CM

## 2013-07-13 DIAGNOSIS — R946 Abnormal results of thyroid function studies: Secondary | ICD-10-CM

## 2013-07-13 DIAGNOSIS — Z8546 Personal history of malignant neoplasm of prostate: Secondary | ICD-10-CM

## 2013-07-13 LAB — LIPID PANEL
Cholesterol: 140 mg/dL (ref 0–200)
LDL Cholesterol: 54 mg/dL (ref 0–99)
Triglycerides: 67 mg/dL (ref 0.0–149.0)
VLDL: 13.4 mg/dL (ref 0.0–40.0)

## 2013-07-13 LAB — BASIC METABOLIC PANEL
BUN: 14 mg/dL (ref 6–23)
Chloride: 103 mEq/L (ref 96–112)
Creatinine, Ser: 0.8 mg/dL (ref 0.4–1.5)
GFR: 100.08 mL/min (ref >60.00)
Glucose, Bld: 89 mg/dL (ref 70–99)
Potassium: 4.7 mEq/L (ref 3.5–5.1)

## 2013-07-13 LAB — CBC WITH DIFFERENTIAL/PLATELET
Basophils Relative: 0.6 % (ref 0.0–3.0)
Eosinophils Relative: 3.7 % (ref 0.0–5.0)
MCV: 108.9 fl — ABNORMAL HIGH (ref 78.0–100.0)
Monocytes Relative: 9.5 % (ref 3.0–12.0)
Neutrophils Relative %: 40.5 % — ABNORMAL LOW (ref 43.0–77.0)
Platelets: 177 10*3/uL (ref 150.0–400.0)
RBC: 3.4 Mil/uL — ABNORMAL LOW (ref 4.22–5.81)
WBC: 4.3 10*3/uL — ABNORMAL LOW (ref 4.5–10.5)

## 2013-07-13 LAB — PSA: PSA: 0.26 ng/mL (ref 0.10–4.00)

## 2013-07-13 LAB — HEPATIC FUNCTION PANEL
ALT: 27 U/L (ref 0–53)
AST: 42 U/L — ABNORMAL HIGH (ref 0–37)
Albumin: 4 g/dL (ref 3.5–5.2)
Total Bilirubin: 0.8 mg/dL (ref 0.3–1.2)

## 2013-07-13 LAB — TSH: TSH: 3.12 u[IU]/mL (ref 0.35–5.50)

## 2013-07-20 ENCOUNTER — Ambulatory Visit (INDEPENDENT_AMBULATORY_CARE_PROVIDER_SITE_OTHER)
Admission: RE | Admit: 2013-07-20 | Discharge: 2013-07-20 | Disposition: A | Discharge status: Home / Self Care | Payer: Medicare Other | Source: Ambulatory Visit | Attending: Pulmonary Disease | Admitting: Pulmonary Disease

## 2013-07-20 ENCOUNTER — Encounter: Payer: Self-pay | Admitting: Pulmonary Disease

## 2013-07-20 ENCOUNTER — Ambulatory Visit (INDEPENDENT_AMBULATORY_CARE_PROVIDER_SITE_OTHER): Payer: Medicare Other | Admitting: Pulmonary Disease

## 2013-07-20 VITALS — BP 126/82 | HR 95 | Temp 97.7°F | Ht 73.5 in | Wt 160.6 lb

## 2013-07-20 DIAGNOSIS — I6350 Cerebral infarction due to unspecified occlusion or stenosis of unspecified cerebral artery: Secondary | ICD-10-CM | POA: Diagnosis not present

## 2013-07-20 DIAGNOSIS — D649 Anemia, unspecified: Secondary | ICD-10-CM

## 2013-07-20 DIAGNOSIS — E039 Hypothyroidism, unspecified: Secondary | ICD-10-CM | POA: Diagnosis not present

## 2013-07-20 DIAGNOSIS — E7800 Pure hypercholesterolemia, unspecified: Secondary | ICD-10-CM | POA: Diagnosis not present

## 2013-07-20 DIAGNOSIS — I4891 Unspecified atrial fibrillation: Secondary | ICD-10-CM | POA: Diagnosis not present

## 2013-07-20 DIAGNOSIS — I499 Cardiac arrhythmia, unspecified: Secondary | ICD-10-CM

## 2013-07-20 DIAGNOSIS — K649 Unspecified hemorrhoids: Secondary | ICD-10-CM

## 2013-07-20 DIAGNOSIS — M1990 Unspecified osteoarthritis, unspecified site: Secondary | ICD-10-CM

## 2013-07-20 DIAGNOSIS — K900 Celiac disease: Secondary | ICD-10-CM

## 2013-07-20 DIAGNOSIS — R9389 Abnormal findings on diagnostic imaging of other specified body structures: Secondary | ICD-10-CM

## 2013-07-20 DIAGNOSIS — Z0000 Encounter for general adult medical examination without abnormal findings: Secondary | ICD-10-CM | POA: Diagnosis not present

## 2013-07-20 DIAGNOSIS — Z8546 Personal history of malignant neoplasm of prostate: Secondary | ICD-10-CM

## 2013-07-20 DIAGNOSIS — L259 Unspecified contact dermatitis, unspecified cause: Secondary | ICD-10-CM

## 2013-07-20 NOTE — Patient Instructions (Addendum)
Today we updated your med list in our EPIC system...    Continue your current medications the same...    We refilled your meds as requested...  Today we did a CXR, EKG, & reviewed your recent FASTING blood work...    We will contact you w/ the remaining above results when available...   You have an irregular heart rhythm called Atrial Fibrillation & we will arrange for a Cardiology consultation ASAP...  Call for any questions...  Let's plan a follow up visit in 57mo sooner if needed for problems..Marland KitchenMarland Kitchen

## 2013-07-20 NOTE — Progress Notes (Signed)
Subjective:     Patient ID: [name], male   DOB: 1930/10/03, 79 y.o.   MRN: [id number]  HPI 79 y/o WM here for a follow up visit... he has multiple medical problems as noted below...    ~  February 06, 2010:  he states that he has had a good yr but has lost 14# and appeitite diminished ever since he got some false teeth... he had shingles eruption involving the left T10 dermatome about 2 mo ago & was treated at an [facility] w/ ?Famvir & resolved... he requests refill perscriptions and declines Flu vaccine- "I had the last one in 1984"...  labs show anemia w/ large cells- & we will eval further >> MCV=109, B12= 264 (211-911), Folate= 5.4 (norm>5.4), SPE w/ polyclonal incr (no M-spike), Ferritin= 61, stool hemoccults negx6... 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  ~  June 07, 2012:  >2year RBlue Skysays he's doing well w/o new complaints or concerns;  He still works sLandat [employer]  We decided to give him a Pneumovax today as he is unsure of prev vaccination...    Chol> on Lip20;  FLP 6/13 showed TChol 114, TG 39, HDL 75, LDL 31    Abn TFT> he remains clinically euthyroid but TSH has slowly increased over time; TSH= 6.69 today 7 we decided to start Synthroid567m/d...    GI> proctitis, polyps, hems> hx prostate seed implants for prostate ca & colonoscopies w/ radiation proctitis, int hems, & 74m61myperplastic polyp removed 4/11 colonoscopy by DrStark...    ?Hx Gluten intolerance> +FamHx of Sprue he tells me & he states he had a pos biopsy 1990 by DrWeissman; he does well on Gluten free diet, but he is not very strict and when Tissue Transglutaminase Antibody was checked 2011 it was normal;  He remains on his Gluten modified diet & he is doing well by his hx...    Etoh> he still drinks ~6pk daily; in light of his macrocytic anemia he was asked to decrease his consumption...    Hx Prostate Cancer> Diagnosed & treated 1998 by DrPMorton radium seeds;  PSA= 0.33 (essent no change over several yrs); he is voiding satis 7 denies urinary symptoms...    DJD> doing satis w/o specific problems; he uses OTC analgesics whenever nec...    Hx CVA> this occurred in the 1980's & resolved w/o residual deficits; he takes ASA 3274m61mhe says...    Macrocytic Anemia> CBCs show Hg 11-12 and MCV= 109, and B12 has been borderline low ~290; he was instructed to take B12 1000mc38mily but he never got it; we reviewed his current blood work 7 asked to start the OTC B12 1000mcg59m day...    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 We reviewed the following medical problems during today's office visit >>     NEW AFIB, Hx Abn Echo> rhythm noted to be irreg today & EKG confirms AFib w/ rate ~90; he is asymptomatic w/o CP, palpit, dizzy, SOB, etc; we discussed need for Cardiology eval, repeat 2DEcho (see below for prev Echo results), anticoag, etc...     Chol> on Lip20;  FLP 7/14 showed TChol 140, TG 67, HDL 73, LDL 54    Hypothyroid> on Synthroid15mg/d; med started 6/13 when TSH rose to 6.69; labs 7/14 showed TSH= 3.12 on med & clinically euthyroid as well...    GI> proctitis, polyps, hems> hx prostate seed implants for ca & colonoscopies  showed radiation proctitis, int hems, & 567mhyperplastic polyp removed 4/11 colonoscopy by DrStark...    ?Hx Gluten intolerance> +FamHx of Sprue he tells me & he states he had a pos biopsy 1990 by DrWeissman; he does well on Gluten free diet, but he is not very strict and when Tissue Transglutaminase Antibody was checked 2011 it was normal;  He remains on his Gluten modified diet & doing well by his hx...    Etoh> he still drinks ~6pk daily; in light of his macrocytic anemia he was asked to decrease his consumption (no change)...    Hx Prostate Cancer> Diagnosed & treated 1998 by DrPeterson w/ radium seeds; PSA= 0.26 (stable); he is voiding satis & denies urinary symptoms; he saw DrEskridge 4/14 & doing well, f/u 1y29yr    DJD> doing satis w/o specific problems; he uses OTC analgesics whenever nec...    Hx CVA> this occurred in the 1980's & resolved w/o residual deficits; he takes ASA 325m65m7 denies cerebral ischemic symptoms...    Macrocytic Anemia> on B12 1000mc81m CBCs show Hg 11-12 and MCV= 109, and B12 has been borderline low ~290; asked to stop the beer 7 continue MVI + B12 orally...    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 EKGMarland KitchenMarland Kitchen/14 showed AFib, rate 87, poor R prog V1-3, NAD... LABS 7/14:  FLP- at goals on Lip20;  Chems- wnl, GOT=42;  CBC- Hg=12.1, MCV=109;  TSH=3.12 on synth50;  PSA=0.26...          Problem List:    NEW ATRIAL FIBRILLATION >> first detected 07/20/13 OV... Hx of ECHOCARDIOGRAM, ABNORMAL (ICD-793.2) - prev 2DEcho w/ ?  old vegetation of non-coronary cusp of AoV w/ discete non-mobile nodule felt to be a healed vegetation seen on Echo in 1988 by DrHSmith...  he denies CP, palpit, syncope, edema, etc... ~  last 2DEcho 1998 & read by DrKatzBenay SpiceV sclerosis & thickening of the non-coronary cusp, w/o stenosis or AI... ~  CXR 8/14 showed normal heart size, clear lungs, NAD... ~  EKG 8/14 showed AFib, rate 87, poor R prog V1-3, NAD => referred to Cards.  HYPERCHOLESTEROLEMIA (ICD-272.0) - on LIPITOR 20mg/d49m~  FLP 8/0Sunolhowed TChol 133,  TG 73, HDL 63, LDL 55... rec- continue Lip20 + diet. ~  [place] 12/09 showed TChol 152, TG 195, HDL 46, LDL 67... rec- same med + better low fat diet! ~  FLP 2/11 showed TChol 115, TG 134, HDL 61, LDL 27... WOW!!! ~  [place] 6/13 on Lip20 showed TChol 114, TG 39, HDL 75, LDL 31 ~  FLP 7/14 on Lip20 showed TChol 140, TG 67, HDL 73, LDL 54  ABNORMAL THYROID FUNCTION TESTS (ICD-794.5) - clinically euthyroid w/o complaints. ~  labs 12/09 showed TSH= 4.82 ~  labs 2/11 showed TSH= 5.94... offered Synthroid Rx but he wants to wait & repeat blood work later. ~  Labs 6/13 showed TSH= 6.69... Asked to start Synthroid 28mg/d... 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 ~  He remains on his Gluten modified diet & he is doing well by his hx...  Hx of HEMORRHOIDS (ICD-455.6) - he had BRB per rectum in 1999 [facility] revealing radiation proctitis from his seed implants...  ~  colonoscopy 4/05 by DrStark showed some mild, inactive radiation colitis and internal hems... f/u planned 573yr.. ~  Colonoscopy 4/11 by DrStark showed 34m21molyp (hyperplastic), +radiation proctitis, int hems...  ALCOHOL ABUSE (ICD-305.00) - he has a hx of alc  hepatitis in the past w/ elevated SGOT... Marland Kitchentill drinks a 6pk of beer nightly... ~  labs 8/08 showed SGOT= 54 ~  labs 12/09 showed SGOT= 33 ~  labs 2/11 showed SGOT= 38 ~  Labs 6/13 showed SGOT= 43 ~  Labs 7/14 showed SGOT= 42  PROSTATE CANCER, HX OF (ICD-V10.46) - eval and Rx by DrPeterson in 1998 w/ "seeds"... he is checked yearly in the summer months...  ~  labs 8/08 showed PSA= 0.36 ~  labs 12/09 showed PSA= 0.27 ~  labs 2/11 showed PSA= 0.26 ~  Labs 6/13 showed PSA= 0.33 ~  Labs 7/14 showed PSA= 0.26  DEGENERATIVE JOINT DISEASE (ICD-715.90)  Hx of OLECRANON BURSITIS (ICD-726.33)  CARPAL TUNNEL SYNDROME, HX OF (ICD-V12.49)  CVA (ICD-434.91) - he takes ASA 57m57m.. had TIA/ CVA while in [place] in the 1980's... no recurrent problems since that time...  DERMATITIS, ALLERGIC (ICD-692.9) - hx dermatitis: ?dermatitis herpetiformis vs gluten dermatits followed by DrHouston... pt states that he used to get a "peel" every 3 months... he is taking DAPSONE 100mg58m2 tab daily which he states he has been on for >51yrs63yrrets us to [place]... ~  2/11: he reported a bout of Shingles ~12/10 involving left T10, [facility] & resolved.  MACROCYTIC ANEMIA (ICD-281.9) --- he has macrocytosis w/ borderline B12, ?alcohol related vs other? ~  labs 8/08 showed Hg= 12.5, MCV= 103, B12= 293 (211-911)... ~  labs 12/09 showed Hg= 12.2, MCV= 106, B12 level 306 (211-911)... advised MVI, folic acid, etc... ~  labs 2/11 showed Hg= 10.7 (repeat 11.7), MCV= 109, B12= 264, Folate= 19, Fe= 155, SPE/ IEP & colon pending. ~  2/11: We discussed starting Vit B 12 supplement 1000mcg 2my==> he never got this... ~  Labs 6/13 showed Hg= 10.8, MCV= 110... Pt is asked to start the B12 1000mcg/d71m... ~  Labs 7/14 on B12-1000 showed Hg=  12.1, MCV= 109, pt was again asked to stop all Etoh...   Past Surgical History  Procedure Laterality Date  . Prostate biopsy and see implantation    .  Wrist fracture surgery      Outpatient Encounter Prescriptions as of 07/20/2013  Medication Sig Dispense Refill  . aspirin 325 MG EC tablet Take 325 mg by mouth daily.      Marland Kitchen atorvastatin (LIPITOR) 20 MG tablet Take 1 tablet (20 mg total) by mouth daily.  30 tablet  0  . cyanocobalamin 1000 MCG tablet Take 100 mcg by mouth daily.      . dapsone 100 MG tablet Take 0.5 tablets (50 mg total) by mouth daily.  45 tablet  0  . levothyroxine (SYNTHROID, LEVOTHROID) 50 MCG tablet Take 1 tablet (50 mcg total) by mouth daily.  30 tablet  0   No facility-administered encounter medications on file as of 07/20/2013.    No Known Allergies   Current Medications, Allergies, Past Medical History, Past Surgical History, Family History, and Social History were reviewed in [employer] record.   Review of Systems        The patient complains of fatigue, malaise, and weight loss.  The patient denies fever, chills, sweats, anorexia, weakness, sleep disorder, blurring, diplopia, eye irritation, eye discharge, vision loss, eye pain, photophobia, earache, ear discharge, tinnitus, decreased hearing, nasal congestion, nosebleeds, sore throat, hoarseness, chest pain, palpitations, syncope, dyspnea on exertion, orthopnea, PND, peripheral edema, cough, dyspnea at rest, excessive sputum, hemoptysis, wheezing, pleurisy, nausea, vomiting, diarrhea, constipation, change in bowel habits, abdominal pain, melena, hematochezia, jaundice, gas/bloating, indigestion/heartburn, dysphagia, odynophagia, dysuria, hematuria, urinary frequency, urinary hesitancy, nocturia, incontinence, back pain, joint pain, joint swelling, muscle cramps, muscle weakness, stiffness, arthritis, sciatica, restless legs, leg pain at night, leg pain with exertion, rash, itching, dryness, suspicious lesions, paralysis, paresthesias, seizures, tremors, vertigo, transient blindness, frequent falls, frequent headaches, difficulty walking,  depression, anxiety, memory loss, confusion, cold intolerance, heat intolerance, polydipsia, polyphagia, polyuria, unusual weight change, abnormal bruising, bleeding, enlarged lymph nodes, urticaria, allergic rash, hay fever, and recurrent infections.     Objective:   Physical Exam     WD, WN, 79 y/o WM in NAD... GENERAL:  Alert & oriented; pleasant & cooperative... HEENT:  [place]/AT, EOM-wnl, PERRLA, EACs-clear, TMs-wnl, NOSE-clear, THROAT-clear & wnl. NECK:  Supple w/ fairROM; no JVD; normal carotid impulses w/o bruits; no thyromegaly or nodules palpated; no lymphadenopathy. CHEST:  Clear to P & A; without wheezes/ rales/ or rhonchi. HEART:  Regular Rhythm; without murmurs/ rubs/ or gallops. ABD:  Soft & nontender; normal BS, no organomegaly or masses palpated... EXT: without deformities, mild arthritic changes; no varicose veins/ venous insuffic/ or edema. NEURO:  CN's intact; motor testing normal; sensory testing normal; gait normal & balance OK. DERM:  No lesions noted; no rash etc...  RADIOLOGY DATA:  Reviewed in the EPIC EMR & discussed w/ the patient...  LABORATORY DATA:  Reviewed in the EPIC EMR & discussed w/ the patient...   Assessment:      Newly discovered ATRIAL FIBRILLATION> he has a controlled ventric response, & asymptomatic- referred to Cards for their eval, we broached anticoag, cordioversion vs rate control etc...   CHOL>  On Lip20 w/ good control, continue same...  Hypothyroid> on low dose Syntroid 61mg/d & TSH is wnl now...  GI> proctitis, polyps, hems>  Last colonoscopy 4/11 reviewed; he denies abd pain, n/v, c/d, or blood seen...  ?Gluten enteropathy>  As above; he does better on  gluten restricted diet but is not very strict...  Etoh>  Still drinks 6pk daily; SGOT=43 & he is asked to cut back...  Hx Prostate cancer>  Treated in 1998 w/ radium seeds and his PSA has been stable = 0.26 currently...  DJD>  Aware, he is minimally symptomatic noted mostly after  a long shift at work...  Hx CVA>  This occurred 1980 7 resolved w/o any residuals, no recurrence, on ASA 332m/d...  Macrocytic anemia, mild pancytopenia>  He has mild pancytopenia & MCV=109; B12 levels in the past ~290 & he is asked (again) to take 10055m/d OTC supplement...  Dermatitis>  On Dapsone x yrs 7 he states that rash returns if he stops it, therefore refilled per request...     Plan:     Patient's Medications  New Prescriptions   No medications on file  Previous Medications   ASPIRIN 325 MG EC TABLET    Take 325 mg by mouth daily.   ATORVASTATIN (LIPITOR) 20 MG TABLET    Take 1 tablet (20 mg total) by mouth daily.   CYANOCOBALAMIN 1000 MCG TABLET    Take 100 mcg by mouth daily.   DAPSONE 100 MG TABLET    Take 0.5 tablets (50 mg total) by mouth daily.   LEVOTHYROXINE (SYNTHROID, LEVOTHROID) 50 MCG TABLET    Take 1 tablet (50 mcg total) by mouth daily.  Modified Medications   No medications on file  Discontinued Medications   No medications on file

## 2013-07-26 ENCOUNTER — Ambulatory Visit (INDEPENDENT_AMBULATORY_CARE_PROVIDER_SITE_OTHER): Payer: Medicare Other | Admitting: Cardiology

## 2013-07-26 ENCOUNTER — Encounter: Payer: Self-pay | Admitting: Cardiology

## 2013-07-26 VITALS — BP 118/80 | HR 107 | Wt 159.0 lb

## 2013-07-26 DIAGNOSIS — R011 Cardiac murmur, unspecified: Secondary | ICD-10-CM

## 2013-07-26 DIAGNOSIS — I639 Cerebral infarction, unspecified: Secondary | ICD-10-CM

## 2013-07-26 DIAGNOSIS — I6350 Cerebral infarction due to unspecified occlusion or stenosis of unspecified cerebral artery: Secondary | ICD-10-CM | POA: Diagnosis not present

## 2013-07-26 DIAGNOSIS — I4891 Unspecified atrial fibrillation: Secondary | ICD-10-CM

## 2013-07-26 MED ORDER — METOPROLOL SUCCINATE ER 25 MG PO TB24: 25.0000 mg | ORAL_TABLET | Freq: Every day | ORAL | Status: DC

## 2013-07-26 MED ORDER — APIXABAN 5 MG PO TABS: 5.0000 mg | ORAL_TABLET | Freq: Two times a day (BID) | ORAL | Status: DC

## 2013-07-26 NOTE — Patient Instructions (Addendum)
Your physician recommends that you schedule a follow-up appointment in: [place] has requested that you have an echocardiogram. Echocardiography is a painless test that uses sound waves to create images of your heart. It provides your doctor with information about the size and shape of your heart and how well your heart's chambers and valves are working. This procedure takes approximately one hour. There are no restrictions for this procedure.   STOP ASPIRIN  START ELIQUIS 5 MG ONE TABLET TWICE DAILY  START METOPROLOL 25 MG ONCE DAILY  Your physician recommends that you return for lab work in: [place]

## 2013-07-26 NOTE — Assessment & Plan Note (Signed)
Patient with aortic insufficiency murmur on examination. Schedule echocardiogram to better assess.

## 2013-07-26 NOTE — Progress Notes (Signed)
  HPI: 79 year old male for evaluation of atrial fibrillation. Patient had electrocardiogram on 07/20/2013 that showed atrial fibrillation. Cardiology is asked to evaluate. Recent laboratories in July of 2014 showed a BUN and creatinine are 14 and 0.8, potassium 4.7, hemoglobin 12.1 with MCV 108.9, and TSH 3.12. Patient denies dyspnea, chest pain, palpitations, pedal edema, history of bleeding or syncope. No history of falls.  Current Outpatient Prescriptions  Medication Sig Dispense Refill  . aspirin 325 MG EC tablet Take 325 mg by mouth daily.      Marland Kitchen atorvastatin (LIPITOR) 20 MG tablet Take 1 tablet (20 mg total) by mouth daily.  30 tablet  0  . cyanocobalamin 1000 MCG tablet Take 100 mcg by mouth daily.      . dapsone 100 MG tablet Take 0.5 tablets (50 mg total) by mouth daily.  45 tablet  0  . fluorouracil (EFUDEX) 5 % cream prn      . levothyroxine (SYNTHROID, LEVOTHROID) 50 MCG tablet Take 1 tablet (50 mcg total) by mouth daily.  30 tablet  0   No current facility-administered medications for this visit.    No Known Allergies  Past Medical History  Diagnosis Date  . Nonspecific (abnormal) findings on radiological and other examination of other intrathoracic organs   . Pure hypercholesterolemia   . Nonspecific abnormal results of thyroid function study   . Celiac disease   . Unspecified hemorrhoids without mention of complication   . Alcohol abuse   . Prostate cancer   . DJD (degenerative joint disease)   . Olecranon bursitis   . CVA (cerebral infarction)   . Macrocytic anemia   . Allergic dermatitis     Past Surgical History  Procedure Laterality Date  . Prostate biopsy and see implantation    . Wrist fracture surgery      History   Social History  . Marital Status: Married    Spouse Name: N/A    Number of Children: 1  . Years of Education: N/A   Occupational History  . security for [employer]    Social History Main Topics  . Smoking status: Never Smoker   .  Smokeless tobacco: Not on file  . Alcohol Use: Yes     Comment: 3-4 beers per day  . Drug Use: Not on file  . Sexually Active: Not on file   Other Topics Concern  . Not on file   Social History Narrative  . No narrative on file    Family History  Problem Relation Age of Onset  . Heart disease      No family history    ROS: no fevers or chills, productive cough, hemoptysis, dysphasia, odynophagia, melena, hematochezia, dysuria, hematuria, rash, seizure activity, orthopnea, PND, pedal edema, claudication. Remaining systems are negative.  Physical Exam:   Blood pressure 118/80, pulse 107, weight 159 lb (72.122 kg).  General:  Well developed/well nourished in NAD Skin warm/dry Patient not depressed No peripheral clubbing Back-normal HEENT-normal/normal eyelids Neck supple/normal carotid upstroke bilaterally; no bruits; no JVD; no thyromegaly chest - CTA/ normal expansion CV - irrregular/normal S1 and S2; no rubs or gallops;  PMI nondisplaced, 2/6 diastolic murmur left sternal border Abdomen -NT/ND, no HSM, no mass, + bowel sounds, no bruit 2+ femoral pulses, no bruits Ext-no edema, chords, 2+ DP Neuro-grossly nonfocal  ECG atrial fibrillation at a rate of 107. Occasional PVCs or aberrantly conducted beats. Low voltage.

## 2013-07-26 NOTE — Assessment & Plan Note (Signed)
Patient is in atrial fibrillation today. Rate is mildly increased. Add Toprol 25 mg daily. Schedule echocardiogram to evaluate LV function. Embolic risk factors include age greater than 71 and prior CVA. Discontinue aspirin. Begin apixaban 5 mg by mouth twice a day. Check potassium, renal function and hemoglobin in 4 weeks. The patient is asymptomatic and the duration of his atrial fibrillation is unknown. I would therefore continue with rate control and anticoagulation and not pursue attempts at cardioversion. He would need to remain on anticoagulation regardless.

## 2013-07-30 ENCOUNTER — Ambulatory Visit: Payer: Medicare Other | Admitting: Internal Medicine

## 2013-08-22 ENCOUNTER — Other Ambulatory Visit: Payer: Self-pay | Admitting: Pulmonary Disease

## 2013-09-12 ENCOUNTER — Ambulatory Visit: Payer: Medicare Other | Admitting: Cardiovascular Disease

## 2013-09-13 ENCOUNTER — Other Ambulatory Visit (INDEPENDENT_AMBULATORY_CARE_PROVIDER_SITE_OTHER): Payer: Medicare Other

## 2013-09-13 ENCOUNTER — Ambulatory Visit (HOSPITAL_COMMUNITY): Payer: Medicare Other | Attending: Cardiology | Admitting: Radiology

## 2013-09-13 DIAGNOSIS — I6350 Cerebral infarction due to unspecified occlusion or stenosis of unspecified cerebral artery: Secondary | ICD-10-CM

## 2013-09-13 DIAGNOSIS — R011 Cardiac murmur, unspecified: Secondary | ICD-10-CM | POA: Diagnosis not present

## 2013-09-13 DIAGNOSIS — I4891 Unspecified atrial fibrillation: Secondary | ICD-10-CM

## 2013-09-13 DIAGNOSIS — I079 Rheumatic tricuspid valve disease, unspecified: Secondary | ICD-10-CM | POA: Insufficient documentation

## 2013-09-13 DIAGNOSIS — I059 Rheumatic mitral valve disease, unspecified: Secondary | ICD-10-CM | POA: Insufficient documentation

## 2013-09-13 DIAGNOSIS — I639 Cerebral infarction, unspecified: Secondary | ICD-10-CM

## 2013-09-13 LAB — CBC WITH DIFFERENTIAL/PLATELET
Basophils Absolute: 0 10*3/uL (ref 0.0–0.1)
Basophils Relative: 0.4 % (ref 0.0–3.0)
Eosinophils Absolute: 0 10*3/uL (ref 0.0–0.7)
Eosinophils Relative: 0.8 % (ref 0.0–5.0)
HCT: 33.8 % — ABNORMAL LOW (ref 39.0–52.0)
Hemoglobin: 11.3 g/dL — ABNORMAL LOW (ref 13.0–17.0)
Lymphocytes Relative: 36.4 % (ref 12.0–46.0)
Lymphs Abs: 1.5 10*3/uL (ref 0.7–4.0)
MCHC: 33.4 g/dL (ref 30.0–36.0)
MCV: 107.2 fl — ABNORMAL HIGH (ref 78.0–100.0)
Monocytes Relative: 12 % (ref 3.0–12.0)
Neutro Abs: 2 10*3/uL (ref 1.4–7.7)
Neutrophils Relative %: 50.4 % (ref 43.0–77.0)
RBC: 3.15 Mil/uL — ABNORMAL LOW (ref 4.22–5.81)
WBC: 4 10*3/uL — ABNORMAL LOW (ref 4.5–10.5)

## 2013-09-13 LAB — BASIC METABOLIC PANEL
BUN: 10 mg/dL (ref 6–23)
CO2: 28 mEq/L (ref 19–32)
Calcium: 9.1 mg/dL (ref 8.4–10.5)
Creatinine, Ser: 0.7 mg/dL (ref 0.4–1.5)
GFR: 116.95 mL/min (ref >60.00)
Glucose, Bld: 91 mg/dL (ref 70–99)
Sodium: 137 mEq/L (ref 135–145)

## 2013-09-13 NOTE — Progress Notes (Signed)
Echocardiogram performed.  

## 2013-09-24 ENCOUNTER — Other Ambulatory Visit: Payer: Self-pay | Admitting: Pulmonary Disease

## 2013-10-25 ENCOUNTER — Ambulatory Visit (INDEPENDENT_AMBULATORY_CARE_PROVIDER_SITE_OTHER): Payer: Medicare Other | Admitting: Cardiology

## 2013-10-25 ENCOUNTER — Encounter: Payer: Self-pay | Admitting: Cardiology

## 2013-10-25 ENCOUNTER — Ambulatory Visit: Payer: Medicare Other | Admitting: Cardiology

## 2013-10-25 VITALS — BP 139/71 | HR 63 | Ht 73.5 in | Wt 169.1 lb

## 2013-10-25 DIAGNOSIS — I4891 Unspecified atrial fibrillation: Secondary | ICD-10-CM

## 2013-10-25 DIAGNOSIS — D539 Nutritional anemia, unspecified: Secondary | ICD-10-CM | POA: Diagnosis not present

## 2013-10-25 DIAGNOSIS — E7800 Pure hypercholesterolemia, unspecified: Secondary | ICD-10-CM | POA: Diagnosis not present

## 2013-10-25 NOTE — Progress Notes (Signed)
HPI: FU atrial fibrillation. Patient had electrocardiogram on 07/20/2013 that showed atrial fibrillation. Laboratories in July of 2014 showed a BUN and creatinine are 14 and 0.8, potassium 4.7, hemoglobin 12.1 with MCV 108.9, and TSH 3.12. Echocardiogram in September 2014 showed an ejection fraction of 50-55%, mild to moderate left atrial enlargement and moderate right atrial enlargement. There was moderate mitral and tricuspid regurgitation. Laboratories in September of 2014 showed a white blood cell count of 4, hemoglobin 11.3, MCV of 107 and platelet count 175. We added anticoagulation at last office visit and plan rate control. Since he was last seen, the patient denies any dyspnea on exertion, orthopnea, PND, pedal edema, palpitations, syncope or chest pain.    Current Outpatient Prescriptions  Medication Sig Dispense Refill  . apixaban (ELIQUIS) 5 MG TABS tablet Take 1 tablet (5 mg total) by mouth 2 (two) times daily.  60 tablet  12  . atorvastatin (LIPITOR) 20 MG tablet take 1 tablet by mouth once daily  90 tablet  3  . cyanocobalamin 1000 MCG tablet Take 100 mcg by mouth daily.      . dapsone 100 MG tablet Take 0.5 tablets (50 mg total) by mouth daily.  45 tablet  0  . fluorouracil (EFUDEX) 5 % cream prn      . levothyroxine (SYNTHROID, LEVOTHROID) 50 MCG tablet Take 1 tablet (50 mcg total) by mouth daily.  30 tablet  0  . metoprolol succinate (TOPROL XL) 25 MG 24 hr tablet Take 1 tablet (25 mg total) by mouth daily.  30 tablet  11   No current facility-administered medications for this visit.     Past Medical History  Diagnosis Date  . Nonspecific (abnormal) findings on radiological and other examination of other intrathoracic organs   . Pure hypercholesterolemia   . Nonspecific abnormal results of thyroid function study   . Celiac disease   . Unspecified hemorrhoids without mention of complication   . Alcohol abuse   . Prostate cancer   . DJD (degenerative joint  disease)   . Olecranon bursitis   . CVA (cerebral infarction)   . Macrocytic anemia   . Allergic dermatitis   . Atrial fibrillation     Past Surgical History  Procedure Laterality Date  . Prostate biopsy and see implantation    . Wrist fracture surgery      History   Social History  . Marital Status: Married    Spouse Name: N/A    Number of Children: 1  . Years of Education: N/A   Occupational History  . security for [employer]    Social History Main Topics  . Smoking status: Never Smoker   . Smokeless tobacco: Not on file  . Alcohol Use: Yes     Comment: 3-4 beers per day  . Drug Use: Not on file  . Sexual Activity: Not on file   Other Topics Concern  . Not on file   Social History Narrative  . No narrative on file    ROS: no fevers or chills, productive cough, hemoptysis, dysphasia, odynophagia, melena, hematochezia, dysuria, hematuria, rash, seizure activity, orthopnea, PND, pedal edema, claudication. Remaining systems are negative.  Physical Exam: Well-developed well-nourished in no acute distress.  Skin is warm and dry.  HEENT is normal.  Neck is supple.  Chest is clear to auscultation with normal expansion.  Cardiovascular exam is irregular Abdominal exam nontender or distended. No masses palpated. Extremities show no edema. neuro grossly intact  ECGatrial  fibrillation at a rate of 63. Right axis deviation. Possible limb lead reversal.

## 2013-10-25 NOTE — Assessment & Plan Note (Signed)
Management per primary care. 

## 2013-10-25 NOTE — Patient Instructions (Signed)
Your physician wants you to follow-up in: 6 MONTHS WITH DR CRENSHAW You will receive a reminder letter in the mail two months in advance. If you don't receive a letter, please call our office to schedule the follow-up appointment.  

## 2013-10-25 NOTE — Assessment & Plan Note (Signed)
Patient remains in atrial fibrillation. He is asymptomatic. Plan rate control and anticoagulation. Continue Toprol and apixaban.

## 2013-10-25 NOTE — Assessment & Plan Note (Signed)
I asked patient to followup with primary care for his neutropenia, macrocytosis.

## 2013-10-29 ENCOUNTER — Other Ambulatory Visit: Payer: Self-pay | Admitting: Dermatology

## 2013-10-29 DIAGNOSIS — L821 Other seborrheic keratosis: Secondary | ICD-10-CM | POA: Diagnosis not present

## 2013-10-29 DIAGNOSIS — C44319 Basal cell carcinoma of skin of other parts of face: Secondary | ICD-10-CM | POA: Diagnosis not present

## 2013-10-29 DIAGNOSIS — Z85828 Personal history of other malignant neoplasm of skin: Secondary | ICD-10-CM | POA: Diagnosis not present

## 2013-10-29 DIAGNOSIS — L570 Actinic keratosis: Secondary | ICD-10-CM | POA: Diagnosis not present

## 2013-12-24 ENCOUNTER — Other Ambulatory Visit: Payer: Self-pay | Admitting: Pulmonary Disease

## 2014-02-16 DIAGNOSIS — M1710 Unilateral primary osteoarthritis, unspecified knee: Secondary | ICD-10-CM | POA: Diagnosis not present

## 2014-02-16 DIAGNOSIS — IMO0002 Reserved for concepts with insufficient information to code with codable children: Secondary | ICD-10-CM | POA: Diagnosis not present

## 2014-02-16 DIAGNOSIS — M79609 Pain in unspecified limb: Secondary | ICD-10-CM | POA: Diagnosis not present

## 2014-02-16 DIAGNOSIS — I4891 Unspecified atrial fibrillation: Secondary | ICD-10-CM | POA: Diagnosis not present

## 2014-02-18 ENCOUNTER — Telehealth: Payer: Self-pay | Admitting: Pulmonary Disease

## 2014-02-18 DIAGNOSIS — D649 Anemia, unspecified: Secondary | ICD-10-CM

## 2014-02-18 DIAGNOSIS — M1990 Unspecified osteoarthritis, unspecified site: Secondary | ICD-10-CM

## 2014-02-18 DIAGNOSIS — M7989 Other specified soft tissue disorders: Secondary | ICD-10-CM

## 2014-02-18 DIAGNOSIS — M7020 Olecranon bursitis, unspecified elbow: Secondary | ICD-10-CM

## 2014-02-18 DIAGNOSIS — Z8546 Personal history of malignant neoplasm of prostate: Secondary | ICD-10-CM

## 2014-02-18 DIAGNOSIS — Z8669 Personal history of other diseases of the nervous system and sense organs: Secondary | ICD-10-CM

## 2014-02-18 DIAGNOSIS — E039 Hypothyroidism, unspecified: Secondary | ICD-10-CM

## 2014-02-18 DIAGNOSIS — L259 Unspecified contact dermatitis, unspecified cause: Secondary | ICD-10-CM

## 2014-02-18 DIAGNOSIS — D539 Nutritional anemia, unspecified: Secondary | ICD-10-CM

## 2014-02-18 DIAGNOSIS — K649 Unspecified hemorrhoids: Secondary | ICD-10-CM

## 2014-02-18 DIAGNOSIS — E7800 Pure hypercholesterolemia, unspecified: Secondary | ICD-10-CM

## 2014-02-18 DIAGNOSIS — I6350 Cerebral infarction due to unspecified occlusion or stenosis of unspecified cerebral artery: Secondary | ICD-10-CM

## 2014-02-18 DIAGNOSIS — K900 Celiac disease: Secondary | ICD-10-CM

## 2014-02-18 DIAGNOSIS — F1010 Alcohol abuse, uncomplicated: Secondary | ICD-10-CM

## 2014-02-18 NOTE — Telephone Encounter (Signed)
Spoke with pt. Reports L leg edema and pain x2 weeks. Skin is tender and warm to the touch. Denies redness. Stayed off his feet all weekend and it improved. He is back at work today and the swelling and pain has come back. Would like SN recs.  No Known Allergies  SN - please advise. Thanks.

## 2014-02-18 NOTE — Telephone Encounter (Signed)
lmomtcb x 1   Called the pt at work and was told that he works first shift and he had already left for the day.  i called and lmomtcb at his home number.    Per SN---  Any fever? Or drainage?  Infection    If so he will need OV  If not--- No salt, elevate legs, support hose, needs venous doppler to r/o DVT ASAP.

## 2014-02-19 ENCOUNTER — Ambulatory Visit (HOSPITAL_COMMUNITY): Payer: Medicare Other | Attending: Cardiology

## 2014-02-19 DIAGNOSIS — M7989 Other specified soft tissue disorders: Secondary | ICD-10-CM | POA: Insufficient documentation

## 2014-02-19 DIAGNOSIS — M79609 Pain in unspecified limb: Secondary | ICD-10-CM | POA: Diagnosis not present

## 2014-02-19 DIAGNOSIS — R609 Edema, unspecified: Secondary | ICD-10-CM

## 2014-02-19 NOTE — Telephone Encounter (Signed)
I spoke with the pt and he denies any fever or drainage, so I gave him the recs per SN, doppler ordered.   Pt then states he would like to be scheduled for his physical. I advised of SN retirement. Pt wants to be seen before then if possible with labs before. Dr. Lenna Gilford please advise if we can work this pt in for appt and if so what labs would you like to order? Thanks. Ewing Bing, CMA

## 2014-02-19 NOTE — Telephone Encounter (Signed)
[id number] at work #, pt was out of office. Antietam Bing, CMA

## 2014-02-19 NOTE — Telephone Encounter (Signed)
Per SN-please let patient know that SN does not have any openings until 03-20-2014; as SN is no longer considered PCP after 03-20-14 the patient needs to establish with a PCP for physicals and continued care; if patient wants to take a chance with his insurance and be seen by SN after 03-20-14 he needs to understand he will have a specialty copay and insurance could deny the visit leaving the patient to pay for the visit himself.   LMTCB at work number.

## 2014-02-20 NOTE — Telephone Encounter (Signed)
I spoke with the pt and advised. Dr. Lenna Gilford actually had an opening on 03-18-14 so appt set for this. Pt also asks that referral be placed to set appt with pcp in [place].  Also the pt wants labs ordered so he can come in week before for labs. Please advise.  Cecilia Bing, CMA

## 2014-02-20 NOTE — Telephone Encounter (Signed)
Called and spoke with pt about his results per SN.  Pt is coming in on Friday at 9 to see SN.

## 2014-02-22 ENCOUNTER — Other Ambulatory Visit (INDEPENDENT_AMBULATORY_CARE_PROVIDER_SITE_OTHER): Payer: Medicare Other

## 2014-02-22 ENCOUNTER — Ambulatory Visit (INDEPENDENT_AMBULATORY_CARE_PROVIDER_SITE_OTHER): Payer: Medicare Other | Admitting: Pulmonary Disease

## 2014-02-22 ENCOUNTER — Encounter: Payer: Self-pay | Admitting: Pulmonary Disease

## 2014-02-22 VITALS — BP 122/80 | HR 83 | Temp 97.5°F | Ht 73.5 in | Wt 169.6 lb

## 2014-02-22 DIAGNOSIS — M7120 Synovial cyst of popliteal space [Baker], unspecified knee: Secondary | ICD-10-CM | POA: Diagnosis not present

## 2014-02-22 DIAGNOSIS — M7989 Other specified soft tissue disorders: Secondary | ICD-10-CM

## 2014-02-22 DIAGNOSIS — R599 Enlarged lymph nodes, unspecified: Secondary | ICD-10-CM | POA: Diagnosis not present

## 2014-02-22 DIAGNOSIS — I6350 Cerebral infarction due to unspecified occlusion or stenosis of unspecified cerebral artery: Secondary | ICD-10-CM | POA: Diagnosis not present

## 2014-02-22 DIAGNOSIS — D649 Anemia, unspecified: Secondary | ICD-10-CM | POA: Diagnosis not present

## 2014-02-22 DIAGNOSIS — M1990 Unspecified osteoarthritis, unspecified site: Secondary | ICD-10-CM

## 2014-02-22 DIAGNOSIS — E039 Hypothyroidism, unspecified: Secondary | ICD-10-CM

## 2014-02-22 DIAGNOSIS — I4891 Unspecified atrial fibrillation: Secondary | ICD-10-CM

## 2014-02-22 DIAGNOSIS — E7800 Pure hypercholesterolemia, unspecified: Secondary | ICD-10-CM

## 2014-02-22 DIAGNOSIS — Z8546 Personal history of malignant neoplasm of prostate: Secondary | ICD-10-CM

## 2014-02-22 DIAGNOSIS — L259 Unspecified contact dermatitis, unspecified cause: Secondary | ICD-10-CM

## 2014-02-22 DIAGNOSIS — E538 Deficiency of other specified B group vitamins: Secondary | ICD-10-CM | POA: Diagnosis not present

## 2014-02-22 DIAGNOSIS — R591 Generalized enlarged lymph nodes: Secondary | ICD-10-CM

## 2014-02-22 DIAGNOSIS — F1010 Alcohol abuse, uncomplicated: Secondary | ICD-10-CM

## 2014-02-22 LAB — BASIC METABOLIC PANEL
BUN: 13 mg/dL (ref 6–23)
CALCIUM: 9.4 mg/dL (ref 8.4–10.5)
CHLORIDE: 101 meq/L (ref 96–112)
CO2: 28 mEq/L (ref 19–32)
Creatinine, Ser: 0.7 mg/dL (ref 0.4–1.5)
GFR: 116.82 mL/min (ref >60.00)
Glucose, Bld: 89 mg/dL (ref 70–99)
Potassium: 4.9 mEq/L (ref 3.5–5.1)
SODIUM: 136 meq/L (ref 135–145)

## 2014-02-22 LAB — FOLATE

## 2014-02-22 LAB — VITAMIN B12: VITAMIN B 12: 666 pg/mL (ref 211–911)

## 2014-02-22 LAB — CBC WITH DIFFERENTIAL/PLATELET
Basophils Absolute: 0 10*3/uL (ref 0.0–0.1)
Basophils Relative: 0.4 % (ref 0.0–3.0)
EOS ABS: 0 10*3/uL (ref 0.0–0.7)
EOS PCT: 1 % (ref 0.0–5.0)
HCT: 33.1 % — ABNORMAL LOW (ref 39.0–52.0)
Hemoglobin: 10.9 g/dL — ABNORMAL LOW (ref 13.0–17.0)
LYMPHS PCT: 30.6 % (ref 12.0–46.0)
Lymphs Abs: 1.6 10*3/uL (ref 0.7–4.0)
MCHC: 33 g/dL (ref 30.0–36.0)
MCV: 108.4 fl — AB (ref 78.0–100.0)
MONO ABS: 0.6 10*3/uL (ref 0.1–1.0)
Monocytes Relative: 11.6 % (ref 3.0–12.0)
NEUTROS PCT: 56.4 % (ref 43.0–77.0)
Neutro Abs: 2.9 10*3/uL (ref 1.4–7.7)
PLATELETS: 219 10*3/uL (ref 150.0–400.0)
RBC: 3.05 Mil/uL — ABNORMAL LOW (ref 4.22–5.81)
RDW: 14 % (ref 11.5–14.6)
WBC: 5.1 10*3/uL (ref 4.5–10.5)

## 2014-02-22 NOTE — Progress Notes (Signed)
Subjective:     Patient ID: [name], male   DOB: 1930-10-28, 80 y.o.   MRN: [id number]  HPI 80 y/o WM here for a follow up visit... he has multiple medical problems as noted below...    ~  June 07, 2012:  >2year Rochelle says he's doing well w/o new complaints or concerns;  He still works Land [employer];  We decided to give him a Pneumovax today as he is unsure of prev vaccination...    Chol> on Lip20;  FLP 6/13 showed TChol 114, TG 39, HDL 75, LDL 31    Abn TFT> he remains clinically euthyroid but TSH has slowly increased over time; TSH= 6.69 today 7 we decided to start Synthroid59mg/d...    GI> proctitis, polyps, hems> hx prostate seed implants for prostate ca & colonoscopies w/ radiation proctitis, int hems, & 534mhyperplastic polyp removed 4/11 colonoscopy by DrStark...    ?Hx Gluten intolerance> +FamHx of Sprue he tells me & he states he had a pos biopsy 1990 by DrWeissman; he does well on Gluten free diet, but he is not very strict and when Tissue Transglutaminase Antibody was checked 2011 it was normal;  He remains on his Gluten modified diet & he is doing well by his hx...    Etoh> he still drinks ~6pk daily; in light of his macrocytic anemia he was asked to decrease his consumption...    Hx Prostate Cancer> Diagn[place] & [place] radium seeds; PSA= 0.33 (essent no change over several yrs); he is voiding satis 7 denies urinary symptoms...    DJD> doing satis w/o specific problems; he uses OTC analgesics whenever nec...    Hx CVA> this occurred in the 1980's & resolved w/o residual deficits; he takes ASA 32518m he says...    Macrocytic Anemia> CBCs show Hg 11-12 and MCV= 109, and B12 has been borderline low ~290; he was instructed to take B12 1000m2maily but he never got it; we reviewed his current blood work 7 asked to start the OTC B12 1000mc42mr day...    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  We decided to start low dose Synthroid 50mcg/20m  ~  July 20, 2013:  22mo [name] says that he is fine & has no new complaints or concerns; still working full time at [employer], gets plenty of exercise up & down stairs etc... We reviewed the following medical problems during today's office visit >>     NEW AFIB, Hx Abn Echo> rhythm noted to be irreg today & EKG confirms AFib w/ rate ~90; he is asymptomatic w/o CP, palpit, dizzy, SOB, etc; we discussed need for Cardiology eval, repeat 2DEcho (see below for prev Echo results), anticoag, etc...     Chol> on Lip20;  FLP 7/14 showed TChol 140, TG 67, HDL 73, LDL 54    Hypothyroid> on Synthroid50mcg/d;36m started 6/13 when TSH rose  to 6.69; labs 7/14 showed TSH= 3.12 on med & clinically euthyroid as well...    GI> proctitis, polyps, hems> hx prostate seed implants for ca & colonoscopies showed radiation proctitis, int hems, & 28m hyperplastic polyp removed 4/11 colonoscopy by DrStark...    ?Hx Gluten intolerance> +FamHx of Sprue he tells me & he states he had a pos biopsy 1990 by DrWeissman; he does well on Gluten free diet, but he is not very strict and when Tissue Transglutaminase Antibody was checked 2011 it was normal;  He remains on his Gluten modified diet & doing well by his hx...    Etoh> he still drinks ~6pk daily; in light of his macrocytic anemia he was asked to decrease his consumption (no change)...    Hx Prostate Cancer> Diagnosed & treated 1998 by DrPeterson w/ radium seeds; PSA= 0.26  (stable); he is voiding satis & denies urinary symptoms; he saw DrEskridge 4/14 & doing well, f/u 142yr.    DJD> doing satis w/o specific problems; he uses OTC analgesics whenever nec...    Hx CVA> this occurred in the 1980's & resolved w/o residual deficits; he takes ASA 32559m 7 denies cerebral ischemic symptoms...    Macrocytic Anemia> on B12 1000m49m; CBCs show Hg 11-12 and MCV= 109, and B12 has been borderline low ~290; asked to stop the beer 7 continue MVI + B12 orally...    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  EMarland KitchenMarland Kitchen 8/14 showed AFib, rate 87, poor R prog V1-3, NAD...  LABS 7/14:  FLP- at goals on Lip20;  Chems- wnl, GOT=42;  CBC- Hg=12.1, MCV=109;  TSH=3.12 on synth50;  PSA=0.26...  ~  February 22, 2014:  15mo R58mo add-on for swelling in left leg and VenDoppler showing no evid of DVT, but LN in left groin & Baker's cyst behind left knee... His left leg has diffuse swelling & 2+ pitting edema & sl tender in calf; VenDoppler showed Baker's cyst behind left knee & swollen LN in left groin> exam shows ?matted diffuse swelling in left inguinal region (compared to right) but no discrete lump and good pulses w/o bruits; as noted prev he drinks 6pk of beer daily; Pt is asked to cut back & quit the beer due to the edema, elevate legs, NO SALT, support hose, and we will check CT Abd&Pelvis=> (if neg=>refer to Ortho, if pos=>consider bx)...     BP remains stable on MetopER25 w/ BP= 122/80; he has chr AFib on Eliquis5Bid & rate control; he denies CP,  palpit, dizzy, syncope, etc...    He has hypothyroidism on Synthroid50 & TSH has been stable- clinically & biochem euthyroid... We reviewed prob list, meds, xrays and labs> see below for updates >> he had the 2014 Flu vaccine 10/14...  LABS 3/15:  Chems- ok w/ Cr=0.7;  CBC- mild anemia w/ Hg=10.9, MCV=108;  B12=666;  Folate>25...   CT Abd&Pelvis 02/27/14 showed NO evid of adenopathy in the pelvis or inguinal area & no acute process in abd; additional CT  findings include apparent sm bowel malrotation (see report), rectal stool burden, radium seeds in prostate- no signs of metastatic dis, mild compression deformity of L4, cardiomeg & coronary calcif, old gran dis in spleen, aortic calcif, right renal cyst...  ADDENDUM>> discussed w/ pt 03/05/14> he is back to work at [employer], walking, etc; using support hose & improved he says w/ decr swelling; I rec proceed w/ Ortho eval of leg & Baker's cyst but he declines, wants to wait & will call if he wants ortho referral; (states that he had voluminous BM after the CT from the contrast & "emptied out pretty good", we discussed laxatives...           Problem List:    NEW ATRIAL FIBRILLATION >> first detected 07/20/13 OV... Hx of ECHOCARDIOGRAM, ABNORMAL (ICD-793.2) - prev 2DEcho w/ ? old vegetation of non-coronary cusp of AoV w/ discete non-mobile nodule felt to be a healed vegetation seen on Echo in 1988 by DrHSmith...  he denies CP, palpit, syncope, edema, etc... ~  last 2DEcho 1998 & read by Benay Spice w/ AoV sclerosis & thickening of the non-coronary cusp, w/o stenosis or AI... ~  CXR 8/14 showed normal heart size, clear lungs, NAD... ~  EKG 8/14 showed AFib, rate 87, poor R prog V1-3, NAD => referred to Cards. ~  2DEcho 9/14 showed norm LVF w/ EF=50-55%, modMR, LA&RA are mod dilated, mod TR... ~  F/u DrCrenshaw 11/14> chrAFib, abn 2DEcho as above, he remains asymptomatic on Eliquis and Metoprolol...  HYPERCHOLESTEROLEMIA (ICD-272.0) - on LIPITOR  65m/d... ~  FEl Castillo8/08 showed TChol 133, TG 73, HDL 63, LDL 55... rec- continue Lip20 + diet. ~  FPim[place] Two12/09 showed TChol 152, TG 195, HDL 46, LDL 67... rec- same med + better low fat diet! ~  FLP 2/11 showed TChol 115, TG 134, HDL 61, LDL 27... WOW!!! ~  FInd[place]6/13 on Lip20 showed TChol 114, TG 39, HDL 75, LDL 31 ~  FLP 7/14 on Lip20 showed TChol 140, TG 67, HDL 73, LDL 54  ABNORMAL THYROID FUNCTION TESTS (ICD-794.5) - clinically euthyroid w/o complaints. ~  labs 12/09 showed TSH= 4.82 ~  labs 2/11 showed TSH= 5.94... offered Synthroid Rx but he wants to wait & repeat blood work later. ~  Labs 6/13 showed TSH= 6.69... Asked to start Synthroid 513m/d... 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 ~  He remains on his Gluten modified diet & he is doing well by his hx...  Hx of HEMORRHOIDS (ICD-455.6) - he had BRB [facility] rectum in 1999 [facility] revealing radiation proctitis from his seed implants...  ~  colonoscopy 4/05 by DrStark showed some mild, inactive radiation colitis and internal hems... f/u planned 5y53yr. ~  Colonoscopy 4/11 by DrStark showed 5mm103mlyp (hyperplastic), +radiation proctitis, int hems...  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 sMarland Kitchenill drinks a 6pk of beer nightly... ~  labs 8/08 showed SGOT= 54 ~  labs 12/09 showed SGOT= 33 ~  labs 2/11 showed SGOT= 38 ~  Labs 6/13 showed SGOT= 43 ~  Labs 7/14 showed SGOT= 42  PROSTATE CANCER, HX OF (ICD-V10.46) - eval and  Rx by DrPeterson in 1998 w/ "seeds"... he is checked yearly in the summer months...  ~  labs 8/08 showed PSA= 0.36 ~  labs 12/09 showed PSA= 0.27 ~  labs 2/11 showed PSA= 0.26 ~  Labs 6/13 showed PSA=  0.33 ~  Labs 7/14 showed PSA= 0.26  DEGENERATIVE JOINT DISEASE (ICD-715.90)  Hx of OLECRANON BURSITIS (ICD-726.33)  CARPAL TUNNEL SYNDROME, HX OF (ICD-V12.49)  LEFT LEG SWELLING >> Presented 3/15- add on for swelling in left leg and VenDoppler showing no evid of DVT, but LN in left groin & Baker's cyst behind left knee... His left leg was diffusely swollen w/ 2+ pitting edema & sl tender in calf; VenDoppler showed Baker's cyst behind left knee & swollen LN in left groin> exam showed ?matted diffuse swelling in left inguinal region (compared to right) but no discrete lump and good pulses w/o bruits; as noted prev he drinks 6pk of beer daily; Pt is asked to cut back & quit the beer due to the edema, elevate legs, NO SALT, support hose, and we will check CT Abd&Pelvis=> done 02/27/14 showing NO evid of adenopathy in the pelvis or inguinal area & no acute process in abd; additional CT findings include apparent sm bowel malrotation (see report), rectal stool burden, radium seeds in prostate- no signs of metastatic dis, mild compression deformity of L4, cardiomeg & coronary calcif, old gran dis in spleen, aortic calcif, right renal cyst... Rec referral to Ortho for their eval & check but he declined, wanted to wait...  CVA (ICD-434.91) - he was prev on ASA 18m/d, now taking Eliquis5Bid... had TIA/ CVA [place] in [place] in the 1980's... no recurrent problems since that time...  DERMATITIS, ALLERGIC (ICD-692.9) - hx dermatitis: ?dermatitis herpetiformis vs gluten dermatits followed by DrHouston... pt states that he used to get a "peel" every 3 months... he is taking DAPSONE 1023m 1/2 tab daily which he states he has been on for >3035yrnd rets us [place] refill for him... ~  2/11: he reported a bout of Shingles ~12/10 involving left T10, [facility] ?Fam[facility] & resolved.  MACROCYTIC ANEMIA (ICD-281.9) --- he has macrocytosis w/ borderline B12, ?alcohol related vs other? ~  labs 8/08 showed Hg= 12.5,  MCV= 103, B12= 293 (211-911)... ~  labs 12/09 showed Hg= 12.2, MCV= 106, B12 level 306 (211-911)... advised MVI, folic acid, etc... ~  labs 2/11 showed Hg= 10.7 (repeat 11.7), MCV= 109, B12= 264, Folate= 19, Fe= 155, SPE/ IEP & colon pending. ~  2/11: We discussed starting Vit B 12 supplement 1000m97maily==> he never got this... ~  Labs 6/13 showed Hg= 10.8, MCV= 110... Pt is asked to start the B12 1000mc80mnow... ~  Labs 7/14 on B12-1000 showed Hg= 12.1, MCV= 109, pt was again asked to stop all Etoh... ~  3/15: still drinking 6pk/d; CBC showed Hg=10.9, MCV=108, B12=666, Folate>25... Asked to cut back & quit the alcohol...   Past Surgical History  Procedure Laterality Date  . Prostate biopsy and see implantation    . Wrist fracture surgery      Outpatient Encounter Prescriptions as of 02/22/2014  Medication Sig  . apixaban (ELIQUIS) 5 MG TABS tablet Take 1 tablet (5 mg total) by mouth 2 (two) times daily.  . atoMarland Kitchenvastatin (LIPITOR) 20 MG tablet take 1 tablet by mouth once daily  . cyanocobalamin 1000 MCG tablet Take 100 mcg by mouth daily.  . dapsone 100 MG tablet take 1/2 tablet by mouth once daily  . fluorouracil (  EFUDEX) 5 % cream prn  . levothyroxine (SYNTHROID, LEVOTHROID) 50 MCG tablet Take 1 tablet (50 mcg total) by mouth daily.  . metoprolol succinate (TOPROL XL) 25 MG 24 hr tablet Take 1 tablet (25 mg total) by mouth daily.    No Known Allergies   Current Medications, Allergies, Past Medical History, Past Surgical History, Family History, and Social History were reviewed in [employer] record.   Review of Systems        The patient complains of fatigue, malaise, and weight loss.  The patient denies fever, chills, sweats, anorexia, weakness, sleep disorder, blurring, diplopia, eye irritation, eye discharge, vision loss, eye pain, photophobia, earache, ear discharge, tinnitus, decreased hearing, nasal congestion, nosebleeds, sore throat, hoarseness,  chest pain, palpitations, syncope, dyspnea on exertion, orthopnea, PND, peripheral edema, cough, dyspnea at rest, excessive sputum, hemoptysis, wheezing, pleurisy, nausea, vomiting, diarrhea, constipation, change in bowel habits, abdominal pain, melena, hematochezia, jaundice, gas/bloating, indigestion/heartburn, dysphagia, odynophagia, dysuria, hematuria, urinary frequency, urinary hesitancy, nocturia, incontinence, back pain, joint pain, joint swelling, muscle cramps, muscle weakness, stiffness, arthritis, sciatica, restless legs, leg pain at night, leg pain with exertion, rash, itching, dryness, suspicious lesions, paralysis, paresthesias, seizures, tremors, vertigo, transient blindness, frequent falls, frequent headaches, difficulty walking, depression, anxiety, memory loss, confusion, cold intolerance, heat intolerance, polydipsia, polyphagia, polyuria, unusual weight change, abnormal bruising, bleeding, enlarged lymph nodes, urticaria, allergic rash, hay fever, and recurrent infections.     Objective:   Physical Exam     WD, WN, 80 y/o WM in NAD... GENERAL:  Alert & oriented; pleasant & cooperative... HEENT:  [place]/AT, EOM-wnl, PERRLA, EACs-clear, TMs-wnl, NOSE-clear, THROAT-clear & wnl. NECK:  Supple w/ fairROM; no JVD; normal carotid impulses w/o bruits; no thyromegaly or nodules palpated; no lymphadenopathy. CHEST:  Clear to P & A; without wheezes/ rales/ or rhonchi. HEART:  Regular Rhythm; without murmurs/ rubs/ or gallops. ABD:  Soft & nontender; normal BS, no organomegaly or masses palpated... EXT: mild arthritic changes; mild VV & ven insuffic; LEFT LEG SWOLLEN 2+ w/ pitting edema, baker's cyst, ?matted LNs in left groin... NEURO:  CN's intact; motor testing normal; sensory testing normal; gait normal & balance OK. DERM:  No lesions noted; no rash etc...  RADIOLOGY DATA:  Reviewed in the EPIC EMR & discussed w/ the patient...  LABORATORY DATA:  Reviewed in the EPIC EMR & discussed w/ the  patient...   Assessment:      LEFT LEG SWELLING>  He has underlying VV, ven insuffic; but new 2+ left leg edema w/ pitting & tenderness in calf; Dopplers were NEG for DVT, pos baker's cyst and LN in left inguinal area; we decided to check CT Abd&Pelvis & go from there=> see above...    ATRIAL FIBRILLATION> he has a controlled ventric response, & asymptomatic- seen by DrCrenshaw on Eliquis and Metoprolol for rate control...  CHOL>  On Lip20 w/ good control, continue same...  Hypothyroid> on low dose Syntroid 56mg/d & TSH is wnl now...  GI> proctitis, polyps, hems>  Last colonoscopy 4/11 reviewed; he denies abd pain, n/v, c/d, or blood seen...  ?Gluten enteropathy>  As above; he does better on gluten restricted diet but is not very strict...  Etoh>  Still drinks 6pk daily; SGOT=43 & he is asked to cut back & quit...  Hx Prostate cancer>  Treated in 1998 w/ radium seeds and his PSA has been stable = 0.26 currently...  DJD>  Aware, he is minimally symptomatic noted mostly after a long shift at work..Marland KitchenMarland Kitchen  Hx CVA>  This occurred 1980 & resolved w/o any residuals, no recurrence, prev on ASA now Eliquis...  Macrocytic anemia, mild pancytopenia>  He has mild pancytopenia & MCV=108; Likely due to his Etoh & asked to cut back & quit=> also taking oral B12 supplement & B12 level 3/15= 666, Folate>25...  Dermatitis>  On Dapsone x yrs 7 he states that rash returns if he stops it, therefore refilled per request...     Plan:     Patient's Medications  New Prescriptions   No medications on file  Previous Medications   APIXABAN (ELIQUIS) 5 MG TABS TABLET    Take 1 tablet (5 mg total) by mouth 2 (two) times daily.   ATORVASTATIN (LIPITOR) 20 MG TABLET    take 1 tablet by mouth once daily   CYANOCOBALAMIN 1000 MCG TABLET    Take 100 mcg by mouth daily.   DAPSONE 100 MG TABLET    take 1/2 tablet by mouth once daily   FLUOROURACIL (EFUDEX) 5 % CREAM    prn   LEVOTHYROXINE (SYNTHROID, LEVOTHROID)  50 MCG TABLET    Take 1 tablet (50 mcg total) by mouth daily.   METOPROLOL SUCCINATE (TOPROL XL) 25 MG 24 HR TABLET    Take 1 tablet (25 mg total) by mouth daily.  Modified Medications   No medications on file  Discontinued Medications   No medications on file

## 2014-02-22 NOTE — Patient Instructions (Signed)
Today we updated your med list in our EPIC system...    Continue your current medications the same...  For the left leg swelling>>     NO SALT (& cut back/stop the beer)    Elevate the leg...    Wear support hose (20-74m compression would be the best)    Use Tylenol as needed for discomfort/pain...  Today we did your follow up blood work... We will arrange for a CT scan of your Abd and Pelvis to check for any abnormalities or swollen lymph nodes...    We will contact you w/ the results when available... And we will then decide on the next step...  Call for any questions..Marland KitchenMarland Kitchen

## 2014-02-26 ENCOUNTER — Telehealth: Payer: Self-pay | Admitting: *Deleted

## 2014-02-26 NOTE — Telephone Encounter (Signed)
Pt has been set up with new primary care at [place]   Dr. Ronnald Ramp on 08/28/2014

## 2014-02-27 ENCOUNTER — Telehealth: Payer: Self-pay | Admitting: Pulmonary Disease

## 2014-02-27 ENCOUNTER — Ambulatory Visit (INDEPENDENT_AMBULATORY_CARE_PROVIDER_SITE_OTHER)
Admission: RE | Admit: 2014-02-27 | Discharge: 2014-02-27 | Disposition: A | Discharge status: Home / Self Care | Payer: Medicare Other | Source: Ambulatory Visit | Attending: Pulmonary Disease | Admitting: Pulmonary Disease

## 2014-02-27 DIAGNOSIS — R599 Enlarged lymph nodes, unspecified: Secondary | ICD-10-CM

## 2014-02-27 DIAGNOSIS — M7989 Other specified soft tissue disorders: Secondary | ICD-10-CM | POA: Diagnosis not present

## 2014-02-27 DIAGNOSIS — R591 Generalized enlarged lymph nodes: Secondary | ICD-10-CM

## 2014-02-27 MED ORDER — IOHEXOL 300 MG/ML  SOLN
100.0000 mL | Freq: Once | INTRAMUSCULAR | Status: AC | PRN
  Administered 2014-02-27: 100 mL via INTRAVENOUS

## 2014-02-27 NOTE — Progress Notes (Signed)
Quick Note:  Pt aware of results and recs ______

## 2014-02-27 NOTE — Telephone Encounter (Signed)
Pt aware of results and recs.

## 2014-03-18 ENCOUNTER — Encounter: Payer: Medicare Other | Admitting: Pulmonary Disease

## 2014-05-03 DIAGNOSIS — L570 Actinic keratosis: Secondary | ICD-10-CM | POA: Diagnosis not present

## 2014-05-03 DIAGNOSIS — Z85828 Personal history of other malignant neoplasm of skin: Secondary | ICD-10-CM | POA: Diagnosis not present

## 2014-08-14 ENCOUNTER — Other Ambulatory Visit: Payer: Self-pay | Admitting: Cardiology

## 2014-08-28 ENCOUNTER — Ambulatory Visit: Payer: Medicare Other | Admitting: Internal Medicine

## 2014-09-17 ENCOUNTER — Other Ambulatory Visit: Payer: Self-pay | Admitting: Pulmonary Disease

## 2014-09-17 ENCOUNTER — Other Ambulatory Visit: Payer: Self-pay | Admitting: Cardiology

## 2014-09-18 ENCOUNTER — Encounter: Payer: Self-pay | Admitting: Gastroenterology

## 2014-09-23 ENCOUNTER — Other Ambulatory Visit: Payer: Self-pay

## 2014-09-23 MED ORDER — ATORVASTATIN CALCIUM 20 MG PO TABS: ORAL_TABLET | ORAL | Status: DC

## 2014-09-23 MED ORDER — LEVOTHYROXINE SODIUM 50 MCG PO TABS: 50.0000 ug | ORAL_TABLET | Freq: Every day | ORAL | Status: DC

## 2014-10-03 ENCOUNTER — Ambulatory Visit (INDEPENDENT_AMBULATORY_CARE_PROVIDER_SITE_OTHER): Payer: Medicare Other | Admitting: Internal Medicine

## 2014-10-03 ENCOUNTER — Encounter: Payer: Self-pay | Admitting: Internal Medicine

## 2014-10-03 ENCOUNTER — Other Ambulatory Visit (INDEPENDENT_AMBULATORY_CARE_PROVIDER_SITE_OTHER): Payer: Medicare Other

## 2014-10-03 VITALS — BP 118/64 | HR 75 | Temp 98.1°F | Resp 16 | Ht 73.5 in | Wt 172.0 lb

## 2014-10-03 DIAGNOSIS — E038 Other specified hypothyroidism: Secondary | ICD-10-CM

## 2014-10-03 DIAGNOSIS — L130 Dermatitis herpetiformis: Secondary | ICD-10-CM

## 2014-10-03 DIAGNOSIS — I480 Paroxysmal atrial fibrillation: Secondary | ICD-10-CM

## 2014-10-03 DIAGNOSIS — D539 Nutritional anemia, unspecified: Secondary | ICD-10-CM

## 2014-10-03 DIAGNOSIS — E7800 Pure hypercholesterolemia, unspecified: Secondary | ICD-10-CM

## 2014-10-03 DIAGNOSIS — I639 Cerebral infarction, unspecified: Secondary | ICD-10-CM | POA: Diagnosis not present

## 2014-10-03 DIAGNOSIS — Z23 Encounter for immunization: Secondary | ICD-10-CM

## 2014-10-03 DIAGNOSIS — K900 Celiac disease: Secondary | ICD-10-CM | POA: Diagnosis not present

## 2014-10-03 DIAGNOSIS — Z0000 Encounter for general adult medical examination without abnormal findings: Secondary | ICD-10-CM | POA: Diagnosis not present

## 2014-10-03 LAB — CBC WITH DIFFERENTIAL/PLATELET
BASOS ABS: 0 10*3/uL (ref 0.0–0.1)
Basophils Relative: 0.3 % (ref 0.0–3.0)
Eosinophils Absolute: 0 10*3/uL (ref 0.0–0.7)
Eosinophils Relative: 0.5 % (ref 0.0–5.0)
HEMATOCRIT: 34.9 % — AB (ref 39.0–52.0)
Hemoglobin: 11.6 g/dL — ABNORMAL LOW (ref 13.0–17.0)
Lymphocytes Relative: 26.1 % (ref 12.0–46.0)
Lymphs Abs: 1.4 10*3/uL (ref 0.7–4.0)
MCHC: 33.3 g/dL (ref 30.0–36.0)
MCV: 106.1 fl — ABNORMAL HIGH (ref 78.0–100.0)
MONO ABS: 0.6 10*3/uL (ref 0.1–1.0)
Monocytes Relative: 10.5 % (ref 3.0–12.0)
Neutro Abs: 3.3 10*3/uL (ref 1.4–7.7)
Neutrophils Relative %: 62.6 % (ref 43.0–77.0)
PLATELETS: 170 10*3/uL (ref 150.0–400.0)
RBC: 3.29 Mil/uL — ABNORMAL LOW (ref 4.22–5.81)
RDW: 13.6 % (ref 11.5–15.5)
WBC: 5.3 10*3/uL (ref 4.0–10.5)

## 2014-10-03 LAB — COMPREHENSIVE METABOLIC PANEL
ALT: 26 U/L (ref 0–53)
AST: 36 U/L (ref 0–37)
Albumin: 3.5 g/dL (ref 3.5–5.2)
Alkaline Phosphatase: 57 U/L (ref 39–117)
BILIRUBIN TOTAL: 1.2 mg/dL (ref 0.2–1.2)
BUN: 15 mg/dL (ref 6–23)
CO2: 27 meq/L (ref 19–32)
Calcium: 9.3 mg/dL (ref 8.4–10.5)
Chloride: 102 mEq/L (ref 96–112)
Creatinine, Ser: 0.8 mg/dL (ref 0.4–1.5)
GFR: 99.77 mL/min (ref >60.00)
Glucose, Bld: 150 mg/dL — ABNORMAL HIGH (ref 70–99)
POTASSIUM: 4.3 meq/L (ref 3.5–5.1)
SODIUM: 136 meq/L (ref 135–145)
TOTAL PROTEIN: 7.5 g/dL (ref 6.0–8.3)

## 2014-10-03 LAB — FERRITIN: FERRITIN: 61 ng/mL (ref 22.0–322.0)

## 2014-10-03 LAB — LIPID PANEL
CHOLESTEROL: 115 mg/dL (ref 0–200)
HDL: 46.2 mg/dL (ref >39.00)
LDL Cholesterol: 60 mg/dL (ref 0–99)
NonHDL: 68.8
Total CHOL/HDL Ratio: 2
Triglycerides: 46 mg/dL (ref 0.0–149.0)
VLDL: 9.2 mg/dL (ref 0.0–40.0)

## 2014-10-03 LAB — IBC PANEL
Iron: 103 ug/dL (ref 42–165)
Saturation Ratios: 34.2 % (ref 20.0–50.0)
Transferrin: 215.1 mg/dL (ref 212.0–360.0)

## 2014-10-03 LAB — TSH: TSH: 1.43 u[IU]/mL (ref 0.35–4.50)

## 2014-10-03 NOTE — Progress Notes (Signed)
Quick Note:  LA - please tell him the his blood sugar was slightly elevated but the other labs look good, I would like to recheck his blood sugar soon. The other labs look good. ______

## 2014-10-03 NOTE — Progress Notes (Signed)
Pre visit review using our clinic review tool, if applicable. No additional management support is needed unless otherwise documented below in the visit note. 

## 2014-10-03 NOTE — Patient Instructions (Signed)
Hypothyroidism The thyroid is a large gland located in the lower front of your neck. The thyroid gland helps control metabolism. Metabolism is how your body handles food. It controls metabolism with the hormone thyroxine. When this gland is underactive (hypothyroid), it produces too little hormone.  CAUSES These include:   Absence or destruction of thyroid tissue.  Goiter due to iodine deficiency.  Goiter due to medications.  Congenital defects (since birth).  Problems with the pituitary. This causes a lack of TSH (thyroid stimulating hormone). This hormone tells the thyroid to turn out more hormone. SYMPTOMS  Lethargy (feeling as though you have no energy)  Cold intolerance  Weight gain (in spite of normal food intake)  Dry skin  Coarse hair  Menstrual irregularity (if severe, may lead to infertility)  Slowing of thought processes Cardiac problems are also caused by insufficient amounts of thyroid hormone. Hypothyroidism in the newborn is cretinism, and is an extreme form. It is important that this form be treated adequately and immediately or it will lead rapidly to retarded physical and mental development. DIAGNOSIS  To prove hypothyroidism, your caregiver may do blood tests and ultrasound tests. Sometimes the signs are hidden. It may be necessary for your caregiver to watch this illness with blood tests either before or after diagnosis and treatment. TREATMENT  Low levels of thyroid hormone are increased by using synthetic thyroid hormone. This is a safe, effective treatment. It usually takes about four weeks to gain the full effects of the medication. After you have the full effect of the medication, it will generally take another four weeks for problems to leave. Your caregiver may start you on low doses. If you have had heart problems the dose may be gradually increased. It is generally not an emergency to get rapidly to normal. HOME CARE INSTRUCTIONS   Take your  medications as your caregiver suggests. Let your caregiver know of any medications you are taking or start taking. Your caregiver will help you with dosage schedules.  As your condition improves, your dosage needs may increase. It will be necessary to have continuing blood tests as suggested by your caregiver.  Report all suspected medication side effects to your caregiver. SEEK MEDICAL CARE IF: Seek medical care if you develop:  Sweating.  Tremulousness (tremors).  Anxiety.  Rapid weight loss.  Heat intolerance.  Emotional swings.  Diarrhea.  Weakness. SEEK IMMEDIATE MEDICAL CARE IF:  You develop chest pain, an irregular heart beat (palpitations), or a rapid heart beat. MAKE SURE YOU:   Understand these instructions.  Will watch your condition.  Will get help right away if you are not doing well or get worse. Document Released: 12/06/2005 Document Revised: 02/28/2012 Document Reviewed: 07/26/2008 [facility] Patient Information 2015 [place], [place]. This information is not intended to replace advice given to you by your health care provider. Make sure you discuss any questions you have with your health care provider. Health Maintenance A healthy lifestyle and preventative care can promote health and wellness.  Maintain regular health, dental, and eye exams.  Eat a healthy diet. Foods like vegetables, fruits, whole grains, low-fat dairy products, and lean protein foods contain the nutrients you need and are low in calories. Decrease your intake of foods high in solid fats, added sugars, and salt. Get information about a proper diet from your health care provider, if necessary.  Regular physical exercise is one of the most important things you can do for your health. Most adults should get at least 150 minutes  of moderate-intensity exercise (any activity that increases your heart rate and causes you to sweat) each week. In addition, most adults need muscle-strengthening exercises  on 2 or more days a week.   Maintain a healthy weight. The body mass index (BMI) is a screening tool to identify possible weight problems. It provides an estimate of body fat based on height and weight. Your health care provider can find your BMI and can help you achieve or maintain a healthy weight. For males 20 years and older:  A BMI below 18.5 is considered underweight.  A BMI of 18.5 to 24.9 is normal.  A BMI of 25 to 29.9 is considered overweight.  A BMI of 30 and above is considered obese.  Maintain normal blood lipids and cholesterol by exercising and minimizing your intake of saturated fat. Eat a balanced diet with plenty of fruits and vegetables. Blood tests for lipids and cholesterol should begin at age 15 and be repeated every 5 years. If your lipid or cholesterol levels are high, you are over age 31, or you are at high risk for heart disease, you may need your cholesterol levels checked more frequently.Ongoing high lipid and cholesterol levels should be treated with medicines if diet and exercise are not working.  If you smoke, find out from your health care provider how to quit. If you do not use tobacco, do not start.  Lung cancer screening is recommended for adults aged 71-80 years who are at high risk for developing lung cancer because of a history of smoking. A yearly low-dose CT scan of the lungs is recommended for people who have at least a 30-pack-year history of smoking and are current smokers or have quit within the past 15 years. A pack year of smoking is smoking an average of 1 pack of cigarettes a day for 1 year (for example, a 30-pack-year history of smoking could mean smoking 1 pack a day for 30 years or 2 packs a day for 15 years). Yearly screening should continue until the smoker has stopped smoking for at least 15 years. Yearly screening should be stopped for people who develop a health problem that would prevent them from having lung cancer treatment.  If you  choose to drink alcohol, do not have more than 2 drinks per day. One drink is considered to be 12 oz (360 mL) of beer, 5 oz (150 mL) of wine, or 1.5 oz (45 mL) of liquor.  Avoid the use of street drugs. Do not share needles with anyone. Ask for help if you need support or instructions about stopping the use of drugs.  High blood pressure causes heart disease and increases the risk of stroke. Blood pressure should be checked at least every 1-2 years. Ongoing high blood pressure should be treated with medicines if weight loss and exercise are not effective.  If you are 77-71 years old, ask your health care provider if you should take aspirin to prevent heart disease.  Diabetes screening involves taking a blood sample to check your fasting blood sugar level. This should be done once every 3 years after age 59 if you are at a normal weight and without risk factors for diabetes. Testing should be considered at a younger age or be carried out more frequently if you are overweight and have at least 1 risk factor for diabetes.  Colorectal cancer can be detected and often prevented. Most routine colorectal cancer screening begins at the age of 35 and continues through age 65.  However, your health care provider may recommend screening at an earlier age if you have risk factors for colon cancer. On a yearly basis, your health care provider may provide home test kits to check for hidden blood in the stool. A small camera at the end of a tube may be used to directly examine the colon (sigmoidoscopy or colonoscopy) to detect the earliest forms of colorectal cancer. Talk to your health care provider about this at age 54 when routine screening begins. A direct exam of the colon should be repeated every 5-10 years through age 46, unless early forms of precancerous polyps or small growths are found.  People who are at an increased risk for hepatitis B should be screened for this virus. You are considered at high risk for  hepatitis B if:  You were born in a country where hepatitis B occurs often. Talk with your health care provider about which countries are considered high risk.  Your parents were born in a high-risk country and you have not received a shot to protect against hepatitis B (hepatitis B vaccine).  You have HIV or AIDS.  You use needles to inject street drugs.  You live with, or have sex with, someone who has hepatitis B.  You are a man who has sex with other men (MSM).  You get hemodialysis treatment.  You take certain medicines for conditions like cancer, organ transplantation, and autoimmune conditions.  Hepatitis C blood testing is recommended for all people born from [birth year] through 1965 and any individual with known risk factors for hepatitis C.  Healthy men should no longer receive prostate-specific antigen (PSA) blood tests as part of routine cancer screening. Talk to your health care provider about prostate cancer screening.  Testicular cancer screening is not recommended for adolescents or adult males who have no symptoms. Screening includes self-exam, a health care provider exam, and other screening tests. Consult with your health care provider about any symptoms you have or any concerns you have about testicular cancer.  Practice safe sex. Use condoms and avoid high-risk sexual practices to reduce the spread of sexually transmitted infections (STIs).  You should be screened for STIs, including gonorrhea and chlamydia if:  You are sexually active and are younger than 24 years.  You are older than 24 years, and your health care provider tells you that you are at risk for this type of infection.  Your sexual activity has changed since you were last screened, and you are at an increased risk for chlamydia or gonorrhea. Ask your health care provider if you are at risk.  If you are at risk of being infected with HIV, it is recommended that you take a prescription medicine daily to  prevent HIV infection. This is called pre-exposure prophylaxis (PrEP). You are considered at risk if:  You are a man who has sex with other men (MSM).  You are a heterosexual man who is sexually active with multiple partners.  You take drugs by injection.  You are sexually active with a partner who has HIV.  Talk with your health care provider about whether you are at high risk of being infected with HIV. If you choose to begin PrEP, you should first be tested for HIV. You should then be tested every 3 months for as long as you are taking PrEP.  Use sunscreen. Apply sunscreen liberally and repeatedly throughout the day. You should seek shade when your shadow is shorter than you. Protect yourself by wearing long sleeves,  pants, a wide-brimmed hat, and sunglasses year round whenever you are outdoors.  Tell your health care provider of new moles or changes in moles, especially if there is a change in shape or color. Also, tell your health care provider if a mole is larger than the size of a pencil eraser.  A one-time screening for abdominal aortic aneurysm (AAA) and surgical repair of large AAAs by ultrasound is recommended for men aged 49-75 years who are current or former smokers.  Stay current with your vaccines (immunizations). Document Released: 06/03/2008 Document Revised: 12/11/2013 Document Reviewed: 05/03/2011 [facility] Patient Information 2015 [place], [place]. This information is not intended to replace advice given to you by your health care provider. Make sure you discuss any questions you have with your health care provider.

## 2014-10-03 NOTE — Progress Notes (Signed)
Subjective:    Patient ID: [name], male    DOB: 10-07-1930, 80 y.o.   MRN: [id number]  Thyroid Problem Presents for follow-up visit. Symptoms include fatigue and leg swelling. Patient reports no anxiety, cold intolerance, constipation, depressed mood, diaphoresis, diarrhea, dry skin, hair loss, heat intolerance, hoarse voice, menstrual problem, nail problem, palpitations, tremors, visual change, weight gain or weight loss. The symptoms have been improving. Past treatments include levothyroxine. The treatment provided moderate relief. His past medical history is significant for atrial fibrillation and hyperlipidemia. There is no history of dementia, diabetes, Graves' ophthalmopathy, heart failure, neuropathy, obesity or osteopenia.      Review of Systems  Constitutional: Positive for fatigue. Negative for fever, chills, weight loss, weight gain, diaphoresis, activity change, appetite change and unexpected weight change.  HENT: Negative.  Negative for hoarse voice.   Eyes: Negative.   Respiratory: Negative.  Negative for cough, choking, chest tightness, shortness of breath and stridor.   Cardiovascular: Negative.  Negative for chest pain, palpitations and leg swelling.  Gastrointestinal: Negative.  Negative for nausea, vomiting, abdominal pain, diarrhea, constipation and blood in stool.  Endocrine: Negative.  Negative for cold intolerance and heat intolerance.  Genitourinary: Negative.  Negative for difficulty urinating and menstrual problem.  Musculoskeletal: Negative.  Negative for arthralgias, back pain, gait problem, joint swelling, myalgias, neck pain and neck stiffness.  Skin: Negative.  Negative for rash.  Allergic/Immunologic: Negative.   Neurological: Negative.  Negative for dizziness, tremors, weakness, light-headedness, numbness and headaches.  Hematological: Negative.  Negative for adenopathy. Does not bruise/bleed easily.  Psychiatric/Behavioral: Negative.          Objective:   Physical Exam  Vitals reviewed. Constitutional: He is oriented to person, place, and time. He appears well-developed and well-nourished. No distress.  HENT:  Head: Normocephalic and atraumatic.  Mouth/Throat: Oropharynx is clear and moist. No oropharyngeal exudate.  Eyes: Conjunctivae are normal. Right eye exhibits no discharge. Left eye exhibits no discharge. No scleral icterus.  Neck: Normal range of motion. Neck supple. No JVD present. No tracheal deviation present. No thyromegaly present.  Cardiovascular: Normal rate, regular rhythm, normal heart sounds and intact distal pulses.  Exam reveals no gallop and no friction rub.   No murmur heard. Pulmonary/Chest: Effort normal and breath sounds normal. No stridor. No respiratory distress. He has no wheezes. He has no rales. He exhibits no tenderness.  Abdominal: Soft. Bowel sounds are normal. He exhibits no distension and no mass. There is no tenderness. There is no rebound and no guarding.  Musculoskeletal: Normal range of motion. He exhibits edema (trace pitting edema in BLE). He exhibits no tenderness.  Lymphadenopathy:    He has no cervical adenopathy.  Neurological: He is oriented to person, place, and time.  Skin: Skin is warm and dry. No rash noted. He is not diaphoretic. No erythema. No pallor.  Psychiatric: He has a normal mood and affect. His behavior is normal. Judgment and thought content normal.     Lab Results  Component Value Date   WBC 5.1 02/22/2014   HGB 10.9* 02/22/2014   HCT 33.1* 02/22/2014   PLT 219.0 02/22/2014   GLUCOSE 89 02/22/2014   CHOL 140 07/13/2013   TRIG 67.0 07/13/2013   HDL 73.10 07/13/2013   LDLCALC 54 07/13/2013   ALT 27 07/13/2013   AST 42* 07/13/2013   NA 136 02/22/2014   K 4.9 02/22/2014   CL 101 02/22/2014   CREATININE 0.7 02/22/2014   BUN 13 02/22/2014  CO2 28 02/22/2014   TSH 3.12 07/13/2013   PSA 0.26 07/13/2013   INR 1.0 06/26/2009       Assessment & Plan:

## 2014-10-04 NOTE — Assessment & Plan Note (Signed)
He has achieved his LDL goal 

## 2014-10-04 NOTE — Assessment & Plan Note (Signed)
His anemia is stable Vitamin levels are normal This appears to be anemia of chronic disease

## 2014-10-04 NOTE — Assessment & Plan Note (Signed)
Continue dapsone

## 2014-10-04 NOTE — Assessment & Plan Note (Signed)

## 2014-10-04 NOTE — Assessment & Plan Note (Signed)
He has good rate and rhythm control Continue eliquis

## 2014-10-04 NOTE — Assessment & Plan Note (Signed)
His TSH is on the normal range He will stay on the current dose

## 2014-10-21 ENCOUNTER — Other Ambulatory Visit: Payer: Self-pay | Admitting: Cardiology

## 2014-10-29 ENCOUNTER — Other Ambulatory Visit: Payer: Self-pay

## 2014-10-29 MED ORDER — LEVOTHYROXINE SODIUM 50 MCG PO TABS: 50.0000 ug | ORAL_TABLET | Freq: Every day | ORAL | Status: DC

## 2014-11-20 ENCOUNTER — Other Ambulatory Visit: Payer: Self-pay | Admitting: Cardiology

## 2014-12-26 ENCOUNTER — Other Ambulatory Visit: Payer: Self-pay | Admitting: Pulmonary Disease

## 2014-12-30 ENCOUNTER — Other Ambulatory Visit: Payer: Self-pay

## 2014-12-30 MED ORDER — ATORVASTATIN CALCIUM 20 MG PO TABS: ORAL_TABLET | ORAL | Status: DC

## 2015-01-02 ENCOUNTER — Other Ambulatory Visit: Payer: Self-pay | Admitting: Internal Medicine

## 2015-01-02 MED ORDER — DAPSONE 100 MG PO TABS: 50.0000 mg | ORAL_TABLET | Freq: Every day | ORAL | Status: DC

## 2015-01-15 NOTE — Progress Notes (Signed)
      HPI: FU atrial fibrillation. Patient had electrocardiogram on 07/20/2013 that showed atrial fibrillation. Laboratories in July of 2014 showed a TSH 3.12. Echocardiogram in September 2014 showed an ejection fraction of 50-55%, mild to moderate left atrial enlargement and moderate right atrial enlargement. There was moderate mitral and tricuspid regurgitation. We treated with rate control/anticoagulation. Since last seen, the patient denies any dyspnea on exertion, orthopnea, PND, pedal edema, palpitations, syncope or chest pain.   Current Outpatient Prescriptions  Medication Sig Dispense Refill  . atorvastatin (LIPITOR) 20 MG tablet take 1 tablet by mouth once daily 90 tablet 4  . cyanocobalamin 1000 MCG tablet Take 100 mcg by mouth daily.    . dapsone 100 MG tablet Take 0.5 tablets (50 mg total) by mouth daily. 45 tablet 3  . ELIQUIS 5 MG TABS tablet take 1 tablet by mouth twice a day 60 tablet 12  . fluorouracil (EFUDEX) 5 % cream prn    . levothyroxine (SYNTHROID, LEVOTHROID) 50 MCG tablet Take 1 tablet (50 mcg total) by mouth daily. Please d/c any previous scripts for the same medication. Thanks 90 tablet 1  . metoprolol succinate (TOPROL-XL) 25 MG 24 hr tablet take 1 tablet by mouth once daily 30 tablet 11   No current facility-administered medications for this visit.     Past Medical History  Diagnosis Date  . Nonspecific (abnormal) findings on radiological and other examination of other intrathoracic organs   . Pure hypercholesterolemia   . Nonspecific abnormal results of thyroid function study   . Celiac disease   . Unspecified hemorrhoids without mention of complication   . Alcohol abuse   . Prostate cancer   . DJD (degenerative joint disease)   . Olecranon bursitis   . CVA (cerebral infarction)   . Macrocytic anemia   . Allergic dermatitis   . Atrial fibrillation     Past Surgical History  Procedure Laterality Date  . Prostate biopsy and see implantation    .  Wrist fracture surgery      History   Social History  . Marital Status: Married    Spouse Name: N/A    Number of Children: 1  . Years of Education: N/A   Occupational History  . security for [employer]    Social History Main Topics  . Smoking status: Never Smoker   . Smokeless tobacco: Not on file  . Alcohol Use: Yes     Comment: 3-4 beers per day  . Drug Use: Not on file  . Sexual Activity: Not on file   Other Topics Concern  . Not on file   Social History Narrative    ROS: no fevers or chills, productive cough, hemoptysis, dysphasia, odynophagia, melena, hematochezia, dysuria, hematuria, rash, seizure activity, orthopnea, PND, pedal edema, claudication. Remaining systems are negative.  Physical Exam: Well-developed well-nourished in no acute distress.  Skin is warm and dry.  HEENT is normal.  Neck is supple.  Chest is clear to auscultation with normal expansion.  Cardiovascular exam is irregular Abdominal exam nontender or distended. No masses palpated. Extremities show no edema. neuro grossly intact  ECG atrial fibrillation at a rate of 75. Prior septal infarct. Left axis deviation.

## 2015-01-20 ENCOUNTER — Encounter: Payer: Self-pay | Admitting: *Deleted

## 2015-01-20 ENCOUNTER — Encounter: Payer: Self-pay | Admitting: Cardiology

## 2015-01-20 ENCOUNTER — Ambulatory Visit (INDEPENDENT_AMBULATORY_CARE_PROVIDER_SITE_OTHER): Payer: Medicare Other | Admitting: Cardiology

## 2015-01-20 ENCOUNTER — Other Ambulatory Visit: Payer: Self-pay | Admitting: Dermatology

## 2015-01-20 VITALS — BP 112/60 | HR 75 | Ht 73.5 in | Wt 168.0 lb

## 2015-01-20 DIAGNOSIS — I340 Nonrheumatic mitral (valve) insufficiency: Secondary | ICD-10-CM | POA: Insufficient documentation

## 2015-01-20 DIAGNOSIS — Z85828 Personal history of other malignant neoplasm of skin: Secondary | ICD-10-CM | POA: Diagnosis not present

## 2015-01-20 DIAGNOSIS — L814 Other melanin hyperpigmentation: Secondary | ICD-10-CM | POA: Diagnosis not present

## 2015-01-20 DIAGNOSIS — I480 Paroxysmal atrial fibrillation: Secondary | ICD-10-CM

## 2015-01-20 DIAGNOSIS — L570 Actinic keratosis: Secondary | ICD-10-CM | POA: Diagnosis not present

## 2015-01-20 LAB — CBC
HEMATOCRIT: 33.8 % — AB (ref 39.0–52.0)
HEMOGLOBIN: 11 g/dL — AB (ref 13.0–17.0)
MCH: 34 pg (ref 26.0–34.0)
MCHC: 32.5 g/dL (ref 30.0–36.0)
MCV: 104.3 fL — AB (ref 78.0–100.0)
MPV: 10.6 fL (ref 8.6–12.4)
Platelets: 193 10*3/uL (ref 150–400)
RBC: 3.24 MIL/uL — ABNORMAL LOW (ref 4.22–5.81)
RDW: 13.1 % (ref 11.5–15.5)
WBC: 4.3 10*3/uL (ref 4.0–10.5)

## 2015-01-20 NOTE — Assessment & Plan Note (Signed)
Patient remains in permanent atrial fibrillation. Continue Toprol for rate control. Continue apixaban; check hemoglobin and renal function now and every 6 months.

## 2015-01-20 NOTE — Patient Instructions (Signed)
Your physician wants you to follow-up in: [place] will receive a reminder letter in the mail two months in advance. If you don't receive a letter, please call our office to schedule the follow-up appointment.  Your physician recommends that you HAVE LAB WORK TODAY  Your physician has requested that you have an echocardiogram. Echocardiography is a painless test that uses sound waves to create images of your heart. It provides your doctor with information about the size and shape of your heart and how well your heart's chambers and valves are working. This procedure takes approximately one hour. There are no restrictions for this procedure.

## 2015-01-20 NOTE — Assessment & Plan Note (Signed)
Plan repeat echocardiogram.

## 2015-01-21 LAB — BASIC METABOLIC PANEL WITH GFR
BUN: 19 mg/dL (ref 6–23)
CO2: 25 meq/L (ref 19–32)
Calcium: 8.9 mg/dL (ref 8.4–10.5)
Chloride: 102 mEq/L (ref 96–112)
Creat: 0.73 mg/dL (ref 0.50–1.35)
GFR, EST NON AFRICAN AMERICAN: 86 mL/min
GLUCOSE: 81 mg/dL (ref 70–99)
Potassium: 4.4 mEq/L (ref 3.5–5.3)
Sodium: 137 mEq/L (ref 135–145)

## 2015-01-24 ENCOUNTER — Telehealth: Payer: Self-pay | Admitting: Cardiology

## 2015-01-24 NOTE — Telephone Encounter (Signed)
Returned call to patient recent lab results given.Advised to see PCP for mild anemia.

## 2015-01-24 NOTE — Telephone Encounter (Signed)
Calling to get the results of lab and/or lab. Please call    Thanks

## 2015-01-27 ENCOUNTER — Ambulatory Visit (HOSPITAL_COMMUNITY)
Admission: RE | Admit: 2015-01-27 | Discharge: 2015-01-27 | Disposition: A | Discharge status: Home / Self Care | Payer: Medicare Other | Source: Ambulatory Visit | Attending: Cardiology | Admitting: Cardiology

## 2015-01-27 DIAGNOSIS — I480 Paroxysmal atrial fibrillation: Secondary | ICD-10-CM

## 2015-01-27 DIAGNOSIS — I340 Nonrheumatic mitral (valve) insufficiency: Secondary | ICD-10-CM | POA: Insufficient documentation

## 2015-01-27 NOTE — Progress Notes (Signed)
2D Echo Performed 01/27/2015    Marygrace Drought, RCS

## 2015-02-24 ENCOUNTER — Telehealth: Payer: Self-pay | Admitting: Internal Medicine

## 2015-02-24 ENCOUNTER — Other Ambulatory Visit: Payer: Self-pay | Admitting: Internal Medicine

## 2015-02-24 DIAGNOSIS — E038 Other specified hypothyroidism: Secondary | ICD-10-CM

## 2015-02-24 DIAGNOSIS — K900 Celiac disease: Secondary | ICD-10-CM

## 2015-02-24 DIAGNOSIS — D539 Nutritional anemia, unspecified: Secondary | ICD-10-CM

## 2015-02-24 NOTE — Telephone Encounter (Signed)
Pt aware.

## 2015-02-24 NOTE — Telephone Encounter (Signed)
Labs ordered.

## 2015-02-24 NOTE — Telephone Encounter (Signed)
Patient called requesting that we enter lab orders so he can have his labs drawn prior to his appt on 03/03/15. He is coming in to follow up on his anemia. Please route back to one of the schedulers so they can call him and let him know that the orders have been entered.

## 2015-02-25 ENCOUNTER — Other Ambulatory Visit (INDEPENDENT_AMBULATORY_CARE_PROVIDER_SITE_OTHER): Payer: Medicare Other

## 2015-02-25 DIAGNOSIS — D539 Nutritional anemia, unspecified: Secondary | ICD-10-CM

## 2015-02-25 DIAGNOSIS — E038 Other specified hypothyroidism: Secondary | ICD-10-CM

## 2015-02-25 DIAGNOSIS — K900 Celiac disease: Secondary | ICD-10-CM

## 2015-02-25 LAB — TSH: TSH: 2.91 u[IU]/mL (ref 0.35–4.50)

## 2015-02-25 LAB — CBC WITH DIFFERENTIAL/PLATELET
Basophils Absolute: 0 10*3/uL (ref 0.0–0.1)
Basophils Relative: 0.2 % (ref 0.0–3.0)
EOS PCT: 0.4 % (ref 0.0–5.0)
Eosinophils Absolute: 0 10*3/uL (ref 0.0–0.7)
HCT: 33.9 % — ABNORMAL LOW (ref 39.0–52.0)
Hemoglobin: 11.4 g/dL — ABNORMAL LOW (ref 13.0–17.0)
LYMPHS ABS: 2 10*3/uL (ref 0.7–4.0)
LYMPHS PCT: 36.9 % (ref 12.0–46.0)
MCHC: 33.7 g/dL (ref 30.0–36.0)
MCV: 104.8 fl — ABNORMAL HIGH (ref 78.0–100.0)
MONOS PCT: 12.6 % — AB (ref 3.0–12.0)
Monocytes Absolute: 0.7 10*3/uL (ref 0.1–1.0)
NEUTROS ABS: 2.7 10*3/uL (ref 1.4–7.7)
Neutrophils Relative %: 49.9 % (ref 43.0–77.0)
PLATELETS: 166 10*3/uL (ref 150.0–400.0)
RBC: 3.23 Mil/uL — ABNORMAL LOW (ref 4.22–5.81)
RDW: 14.1 % (ref 11.5–15.5)
WBC: 5.4 10*3/uL (ref 4.0–10.5)

## 2015-03-01 LAB — VITAMIN B1: Vitamin B1 (Thiamine): 22 nmol/L (ref 8–30)

## 2015-03-03 ENCOUNTER — Ambulatory Visit (INDEPENDENT_AMBULATORY_CARE_PROVIDER_SITE_OTHER): Payer: Medicare Other | Admitting: Internal Medicine

## 2015-03-03 ENCOUNTER — Encounter: Payer: Self-pay | Admitting: Internal Medicine

## 2015-03-03 VITALS — BP 120/68 | HR 88 | Temp 98.6°F | Resp 16 | Ht 73.5 in | Wt 162.0 lb

## 2015-03-03 DIAGNOSIS — E038 Other specified hypothyroidism: Secondary | ICD-10-CM

## 2015-03-03 DIAGNOSIS — D539 Nutritional anemia, unspecified: Secondary | ICD-10-CM

## 2015-03-03 DIAGNOSIS — I481 Persistent atrial fibrillation: Secondary | ICD-10-CM

## 2015-03-03 DIAGNOSIS — Z961 Presence of intraocular lens: Secondary | ICD-10-CM | POA: Diagnosis not present

## 2015-03-03 DIAGNOSIS — I4819 Other persistent atrial fibrillation: Secondary | ICD-10-CM

## 2015-03-03 NOTE — Assessment & Plan Note (Signed)
He feels well and recent TSh was in the normal range Will cont the current dose of synthroid

## 2015-03-03 NOTE — Patient Instructions (Signed)
Hypothyroidism The thyroid is a large gland located in the lower front of your neck. The thyroid gland helps control metabolism. Metabolism is how your body handles food. It controls metabolism with the hormone thyroxine. When this gland is underactive (hypothyroid), it produces too little hormone.  CAUSES These include:   Absence or destruction of thyroid tissue.  Goiter due to iodine deficiency.  Goiter due to medications.  Congenital defects (since birth).  Problems with the pituitary. This causes a lack of TSH (thyroid stimulating hormone). This hormone tells the thyroid to turn out more hormone. SYMPTOMS  Lethargy (feeling as though you have no energy)  Cold intolerance  Weight gain (in spite of normal food intake)  Dry skin  Coarse hair  Menstrual irregularity (if severe, may lead to infertility)  Slowing of thought processes Cardiac problems are also caused by insufficient amounts of thyroid hormone. Hypothyroidism in the newborn is cretinism, and is an extreme form. It is important that this form be treated adequately and immediately or it will lead rapidly to retarded physical and mental development. DIAGNOSIS  To prove hypothyroidism, your caregiver may do blood tests and ultrasound tests. Sometimes the signs are hidden. It may be necessary for your caregiver to watch this illness with blood tests either before or after diagnosis and treatment. TREATMENT  Low levels of thyroid hormone are increased by using synthetic thyroid hormone. This is a safe, effective treatment. It usually takes about four weeks to gain the full effects of the medication. After you have the full effect of the medication, it will generally take another four weeks for problems to leave. Your caregiver may start you on low doses. If you have had heart problems the dose may be gradually increased. It is generally not an emergency to get rapidly to normal. HOME CARE INSTRUCTIONS   Take your  medications as your caregiver suggests. Let your caregiver know of any medications you are taking or start taking. Your caregiver will help you with dosage schedules.  As your condition improves, your dosage needs may increase. It will be necessary to have continuing blood tests as suggested by your caregiver.  Report all suspected medication side effects to your caregiver. SEEK MEDICAL CARE IF: Seek medical care if you develop:  Sweating.  Tremulousness (tremors).  Anxiety.  Rapid weight loss.  Heat intolerance.  Emotional swings.  Diarrhea.  Weakness. SEEK IMMEDIATE MEDICAL CARE IF:  You develop chest pain, an irregular heart beat (palpitations), or a rapid heart beat. MAKE SURE YOU:   Understand these instructions.  Will watch your condition.  Will get help right away if you are not doing well or get worse. Document Released: 12/06/2005 Document Revised: 02/28/2012 Document Reviewed: 07/26/2008 [employer] Patient Information 2015 [employer]. This information is not intended to replace advice given to you by your health care provider. Make sure you discuss any questions you have with your health care provider.  

## 2015-03-03 NOTE — Progress Notes (Signed)
Pre visit review using our clinic review tool, if applicable. No additional management support is needed unless otherwise documented below in the visit note. 

## 2015-03-03 NOTE — Progress Notes (Signed)
   Subjective:    Patient ID: [name], male    DOB: 1930-05-03, 81 y.o.   MRN: [id number]  Thyroid Problem Presents for follow-up visit. Patient reports no anxiety, cold intolerance, constipation, depressed mood, diaphoresis, diarrhea, dry skin, fatigue, hair loss, heat intolerance, hoarse voice, leg swelling, nail problem, palpitations, tremors, visual change, weight gain or weight loss. The symptoms have been stable. Past treatments include levothyroxine. The treatment provided significant relief. His past medical history is significant for atrial fibrillation.      Review of Systems  Constitutional: Negative.  Negative for fever, chills, weight loss, weight gain, diaphoresis, appetite change and fatigue.  HENT: Negative.  Negative for congestion, hoarse voice, nosebleeds and trouble swallowing.   Eyes: Negative.   Respiratory: Negative.  Negative for cough, choking, chest tightness, shortness of breath and stridor.   Cardiovascular: Negative.  Negative for chest pain, palpitations and leg swelling.  Gastrointestinal: Negative.  Negative for nausea, vomiting, abdominal pain, diarrhea, constipation, blood in stool, anal bleeding and rectal pain.  Endocrine: Negative.  Negative for cold intolerance and heat intolerance.  Genitourinary: Negative.  Negative for dysuria, urgency, frequency, hematuria and decreased urine volume.  Musculoskeletal: Negative.   Skin: Negative.   Allergic/Immunologic: Negative.   Neurological: Negative.  Negative for tremors.  Hematological: Negative.  Negative for adenopathy. Does not bruise/bleed easily.  Psychiatric/Behavioral: Negative.  The patient is not nervous/anxious.        Objective:   Physical Exam  Constitutional: He is oriented to person, place, and time. He appears well-developed and well-nourished. No distress.  HENT:  Head: Normocephalic and atraumatic.  Mouth/Throat: Oropharynx is clear and moist. No oropharyngeal exudate.  Eyes:  Conjunctivae are normal. Right eye exhibits no discharge. Left eye exhibits no discharge. No scleral icterus.  Neck: Normal range of motion. Neck supple. No JVD present. No tracheal deviation present. No thyromegaly present.  Cardiovascular: Normal rate, normal heart sounds and intact distal pulses.  An irregularly irregular rhythm present. Exam reveals no gallop and no friction rub.   No murmur heard. Pulmonary/Chest: Effort normal and breath sounds normal. No stridor. No respiratory distress. He has no wheezes. He has no rales. He exhibits no tenderness.  Abdominal: Soft. Bowel sounds are normal. He exhibits no distension and no mass. There is no tenderness. There is no rebound and no guarding.  Musculoskeletal: Normal range of motion. He exhibits no edema or tenderness.  Lymphadenopathy:    He has no cervical adenopathy.  Neurological: He is oriented to person, place, and time.  Skin: Skin is warm and dry. No rash noted. He is not diaphoretic. No erythema. No pallor.  Psychiatric: He has a normal mood and affect. His behavior is normal. Judgment and thought content normal.  Vitals reviewed.     Lab Results  Component Value Date   WBC 5.4 02/25/2015   HGB 11.4* 02/25/2015   HCT 33.9* 02/25/2015   PLT 166.0 02/25/2015   GLUCOSE 81 01/20/2015   CHOL 115 10/03/2014   TRIG 46.0 10/03/2014   HDL 46.20 10/03/2014   LDLCALC 60 10/03/2014   ALT 26 10/03/2014   AST 36 10/03/2014   NA 137 01/20/2015   K 4.4 01/20/2015   CL 102 01/20/2015   CREATININE 0.73 01/20/2015   BUN 19 01/20/2015   CO2 25 01/20/2015   TSH 2.91 02/25/2015   PSA 0.26 07/13/2013   INR 1.0 06/26/2009      Assessment & Plan:

## 2015-03-03 NOTE — Assessment & Plan Note (Signed)
His H and H have improved some This appears to be anemic of chronic disease WiIl follow

## 2015-03-03 NOTE — Assessment & Plan Note (Signed)
He has good rate control and has no s/s from this Will cont current meds

## 2015-05-06 ENCOUNTER — Other Ambulatory Visit: Payer: Self-pay | Admitting: Pulmonary Disease

## 2015-05-13 ENCOUNTER — Other Ambulatory Visit: Payer: Self-pay

## 2015-05-13 MED ORDER — LEVOTHYROXINE SODIUM 50 MCG PO TABS: 50.0000 ug | ORAL_TABLET | Freq: Every day | ORAL | Status: DC

## 2015-07-31 ENCOUNTER — Telehealth: Payer: Self-pay | Admitting: *Deleted

## 2015-07-31 DIAGNOSIS — I4891 Unspecified atrial fibrillation: Secondary | ICD-10-CM

## 2015-07-31 NOTE — Telephone Encounter (Signed)
Left message for pt to call, he is due to have 6 month blood work for eliquis. He will need cbc and bmp. ? Where would he like to do blood work at?

## 2015-08-19 DIAGNOSIS — I4891 Unspecified atrial fibrillation: Secondary | ICD-10-CM | POA: Diagnosis not present

## 2015-08-19 NOTE — Telephone Encounter (Signed)
Spoke with pt, he will come to the lab today. Patient voiced understanding of location.

## 2015-08-20 LAB — CBC
HCT: 33.2 % — ABNORMAL LOW (ref 39.0–52.0)
HEMOGLOBIN: 11.8 g/dL — AB (ref 13.0–17.0)
MCH: 35.4 pg — ABNORMAL HIGH (ref 26.0–34.0)
MCHC: 35.5 g/dL (ref 30.0–36.0)
MCV: 99.7 fL (ref 78.0–100.0)
MPV: 10.2 fL (ref 8.6–12.4)
PLATELETS: 177 10*3/uL (ref 150–400)
RBC: 3.33 MIL/uL — AB (ref 4.22–5.81)
RDW: 13.4 % (ref 11.5–15.5)
WBC: 3.8 10*3/uL — AB (ref 4.0–10.5)

## 2015-08-20 LAB — BASIC METABOLIC PANEL
BUN: 15 mg/dL (ref 7–25)
CALCIUM: 9.3 mg/dL (ref 8.6–10.3)
CO2: 26 mmol/L (ref 20–31)
Chloride: 101 mmol/L (ref 98–110)
Creat: 0.64 mg/dL — ABNORMAL LOW (ref 0.70–1.11)
GLUCOSE: 92 mg/dL (ref 65–99)
Potassium: 4.8 mmol/L (ref 3.5–5.3)
Sodium: 138 mmol/L (ref 135–146)

## 2015-10-08 ENCOUNTER — Encounter: Payer: Self-pay | Admitting: *Deleted

## 2015-11-21 DIAGNOSIS — Z23 Encounter for immunization: Secondary | ICD-10-CM | POA: Diagnosis not present

## 2015-11-24 ENCOUNTER — Other Ambulatory Visit: Payer: Self-pay | Admitting: Cardiology

## 2015-11-24 NOTE — Telephone Encounter (Signed)
Rx request sent to pharmacy.  

## 2015-11-25 ENCOUNTER — Other Ambulatory Visit: Payer: Self-pay

## 2015-11-25 MED ORDER — LEVOTHYROXINE SODIUM 50 MCG PO TABS: 50.0000 ug | ORAL_TABLET | Freq: Every day | ORAL | Status: DC

## 2016-01-07 NOTE — Progress Notes (Signed)
      HPI: FU atrial fibrillation. Patient had electrocardiogram on 07/20/2013 that showed atrial fibrillation. Laboratories in July of 2014 showed a TSH 3.12. We treated with rate control/anticoagulation. Last echo 2/16 showed normal LV function, moderate to severe MR, moderate LAE, mild RAE, mildly elevated pulmonary pressure. Since last seen, the patient denies any dyspnea on exertion, orthopnea, PND, pedal edema, palpitations, syncope or chest pain.   Current Outpatient Prescriptions  Medication Sig Dispense Refill  . atorvastatin (LIPITOR) 20 MG tablet take 1 tablet by mouth once daily 90 tablet 4  . cyanocobalamin 1000 MCG tablet Take 100 mcg by mouth daily.    . dapsone 100 MG tablet Take 0.5 tablets (50 mg total) by mouth daily. 45 tablet 3  . ELIQUIS 5 MG TABS tablet TAKE 1 TABLET BY MOUTH TWICE DAILY 60 tablet 2  . levothyroxine (SYNTHROID, LEVOTHROID) 50 MCG tablet Take 1 tablet (50 mcg total) by mouth daily. Please d/c any previous scripts for the same medication. Thanks 90 tablet 1  . metoprolol succinate (TOPROL-XL) 25 MG 24 hr tablet TAKE 1 TABLET BY MOUTH ONCE DAILY 30 tablet 2   No current facility-administered medications for this visit.     Past Medical History  Diagnosis Date  . Nonspecific (abnormal) findings on radiological and other examination of other intrathoracic organs   . Pure hypercholesterolemia   . Nonspecific abnormal results of thyroid function study   . Celiac disease   . Unspecified hemorrhoids without mention of complication   . Alcohol abuse   . Prostate cancer ([place])   . DJD (degenerative joint disease)   . Olecranon bursitis   . CVA (cerebral infarction)   . Macrocytic anemia   . Allergic dermatitis   . Atrial fibrillation [facility])     Past Surgical History  Procedure Laterality Date  . Prostate biopsy and see implantation    . Wrist fracture surgery      Social History   Social History  . Marital Status: Married    Spouse Name: N/A    . Number of Children: 1  . Years of Education: N/A   Occupational History  . security for [employer]    Social History Main Topics  . Smoking status: Never Smoker   . Smokeless tobacco: Not on file  . Alcohol Use: Yes     Comment: 3-4 beers per day  . Drug Use: Not on file  . Sexual Activity: Not on file   Other Topics Concern  . Not on file   Social History Narrative    Family History  Problem Relation Age of Onset  . Heart disease      No family history    ROS: no fevers or chills, productive cough, hemoptysis, dysphasia, odynophagia, melena, hematochezia, dysuria, hematuria, rash, seizure activity, orthopnea, PND, pedal edema, claudication. Remaining systems are negative.  Physical Exam: Well-developed well-nourished in no acute distress.  Skin is warm and dry.  HEENT is normal.  Neck is supple.  Chest is clear to auscultation with normal expansion.  Cardiovascular exam is irregular Abdominal exam nontender or distended. No masses palpated. Extremities show no edema. neuro grossly intact  ECG Atrial fibrillation with PVCs or aberrantly conducted beats. Low voltage. Cannot rule out septal infarct.

## 2016-01-08 ENCOUNTER — Ambulatory Visit (INDEPENDENT_AMBULATORY_CARE_PROVIDER_SITE_OTHER): Payer: Medicare Other | Admitting: Cardiology

## 2016-01-08 ENCOUNTER — Encounter: Payer: Self-pay | Admitting: Cardiology

## 2016-01-08 VITALS — BP 118/70 | HR 65 | Ht 73.0 in | Wt 168.0 lb

## 2016-01-08 DIAGNOSIS — E7800 Pure hypercholesterolemia, unspecified: Secondary | ICD-10-CM | POA: Diagnosis not present

## 2016-01-08 DIAGNOSIS — I4891 Unspecified atrial fibrillation: Secondary | ICD-10-CM | POA: Diagnosis not present

## 2016-01-08 DIAGNOSIS — I340 Nonrheumatic mitral (valve) insufficiency: Secondary | ICD-10-CM | POA: Diagnosis not present

## 2016-01-08 DIAGNOSIS — I482 Chronic atrial fibrillation: Secondary | ICD-10-CM

## 2016-01-08 DIAGNOSIS — I348 Other nonrheumatic mitral valve disorders: Secondary | ICD-10-CM | POA: Diagnosis not present

## 2016-01-08 DIAGNOSIS — I4821 Permanent atrial fibrillation: Secondary | ICD-10-CM

## 2016-01-08 LAB — CBC
HEMATOCRIT: 33.8 % — AB (ref 39.0–52.0)
HEMOGLOBIN: 11.5 g/dL — AB (ref 13.0–17.0)
MCH: 35.1 pg — AB (ref 26.0–34.0)
MCHC: 34 g/dL (ref 30.0–36.0)
MCV: 103 fL — AB (ref 78.0–100.0)
MPV: 10.2 fL (ref 8.6–12.4)
Platelets: 243 10*3/uL (ref 150–400)
RBC: 3.28 MIL/uL — ABNORMAL LOW (ref 4.22–5.81)
RDW: 13.3 % (ref 11.5–15.5)
WBC: 4.6 10*3/uL (ref 4.0–10.5)

## 2016-01-08 LAB — BASIC METABOLIC PANEL
BUN: 17 mg/dL (ref 7–25)
CO2: 26 mmol/L (ref 20–31)
Calcium: 9.6 mg/dL (ref 8.6–10.3)
Chloride: 96 mmol/L — ABNORMAL LOW (ref 98–110)
Creat: 0.72 mg/dL (ref 0.70–1.11)
GLUCOSE: 91 mg/dL (ref 65–99)
POTASSIUM: 5.1 mmol/L (ref 3.5–5.3)
Sodium: 131 mmol/L — ABNORMAL LOW (ref 135–146)

## 2016-01-08 NOTE — Assessment & Plan Note (Signed)
Repeat echocardiogram. Hopefully patient will not require intervention for this in the future. We will follow for reduced LV function, left ventricular enlargement or symptoms.

## 2016-01-08 NOTE — Patient Instructions (Addendum)
Medication Instructions:   Your physician recommends that you continue on your current medications as directed. Please refer to the Current Medication list given to you today.   If you need a refill on your cardiac medications before your next appointment, please call your pharmacy.  Labwork: CBC BMET   Testing/Procedures:  Your physician has requested that you have an echocardiogram.  In February Echocardiography is a painless test that uses sound waves to create images of your heart. It provides your doctor with information about the size and shape of your heart and how well your heart's chambers and valves are working. This procedure takes approximately one hour. There are no restrictions for this procedure.    Follow-Up:  Your physician wants you to follow-up in: [place] will receive a reminder letter in the mail two months in advance. If you don't receive a letter, please call our office to schedule the follow-up appointment.    Any Other Special Instructions Will Be Listed Below (If Applicable).

## 2016-01-08 NOTE — Assessment & Plan Note (Signed)
Continue statin. 

## 2016-01-08 NOTE — Assessment & Plan Note (Signed)
Patient remains in permanent atrial fibrillation. Continue beta blocker. Continue apixaban. Check hemoglobin and renal function.

## 2016-01-21 ENCOUNTER — Other Ambulatory Visit: Payer: Self-pay

## 2016-01-21 ENCOUNTER — Ambulatory Visit (HOSPITAL_COMMUNITY): Payer: Medicare Other | Attending: Cardiovascular Disease

## 2016-01-21 DIAGNOSIS — I071 Rheumatic tricuspid insufficiency: Secondary | ICD-10-CM | POA: Insufficient documentation

## 2016-01-21 DIAGNOSIS — I340 Nonrheumatic mitral (valve) insufficiency: Secondary | ICD-10-CM | POA: Diagnosis not present

## 2016-01-21 DIAGNOSIS — I358 Other nonrheumatic aortic valve disorders: Secondary | ICD-10-CM | POA: Diagnosis not present

## 2016-01-30 ENCOUNTER — Telehealth: Payer: Self-pay | Admitting: Internal Medicine

## 2016-01-30 MED ORDER — ATORVASTATIN CALCIUM 20 MG PO TABS: ORAL_TABLET | ORAL | Status: DC

## 2016-01-30 MED ORDER — DAPSONE 100 MG PO TABS: 50.0000 mg | ORAL_TABLET | Freq: Every day | ORAL | Status: DC

## 2016-01-30 NOTE — Telephone Encounter (Signed)
Pt needs an apt has not been seen in almost a yr. Overdue for labs will send 30 day supply

## 2016-01-30 NOTE — Telephone Encounter (Signed)
requesting a refill of atorvastatin (LIPITOR) 20 MG tablet [[id number]] and dapsone 100 MG tablet [[id number]]

## 2016-02-02 NOTE — Telephone Encounter (Signed)
appt made

## 2016-02-25 ENCOUNTER — Other Ambulatory Visit (INDEPENDENT_AMBULATORY_CARE_PROVIDER_SITE_OTHER): Payer: Medicare Other

## 2016-02-25 ENCOUNTER — Ambulatory Visit (INDEPENDENT_AMBULATORY_CARE_PROVIDER_SITE_OTHER): Payer: Medicare Other | Admitting: Internal Medicine

## 2016-02-25 ENCOUNTER — Encounter: Payer: Self-pay | Admitting: Internal Medicine

## 2016-02-25 VITALS — BP 120/64 | HR 85 | Temp 97.9°F | Resp 16 | Ht 73.0 in | Wt 170.0 lb

## 2016-02-25 DIAGNOSIS — E785 Hyperlipidemia, unspecified: Secondary | ICD-10-CM | POA: Diagnosis not present

## 2016-02-25 DIAGNOSIS — I480 Paroxysmal atrial fibrillation: Secondary | ICD-10-CM

## 2016-02-25 DIAGNOSIS — Z0000 Encounter for general adult medical examination without abnormal findings: Secondary | ICD-10-CM | POA: Diagnosis not present

## 2016-02-25 DIAGNOSIS — E038 Other specified hypothyroidism: Secondary | ICD-10-CM | POA: Diagnosis not present

## 2016-02-25 DIAGNOSIS — K900 Celiac disease: Secondary | ICD-10-CM

## 2016-02-25 DIAGNOSIS — D539 Nutritional anemia, unspecified: Secondary | ICD-10-CM

## 2016-02-25 LAB — CBC WITH DIFFERENTIAL/PLATELET
BASOS PCT: 0.3 % (ref 0.0–3.0)
Basophils Absolute: 0 10*3/uL (ref 0.0–0.1)
EOS PCT: 0.8 % (ref 0.0–5.0)
Eosinophils Absolute: 0 10*3/uL (ref 0.0–0.7)
HCT: 35.7 % — ABNORMAL LOW (ref 39.0–52.0)
Hemoglobin: 11.9 g/dL — ABNORMAL LOW (ref 13.0–17.0)
LYMPHS ABS: 1.8 10*3/uL (ref 0.7–4.0)
Lymphocytes Relative: 37.6 % (ref 12.0–46.0)
MCHC: 33.2 g/dL (ref 30.0–36.0)
MCV: 103.6 fl — AB (ref 78.0–100.0)
MONO ABS: 0.7 10*3/uL (ref 0.1–1.0)
MONOS PCT: 14 % — AB (ref 3.0–12.0)
NEUTROS ABS: 2.2 10*3/uL (ref 1.4–7.7)
NEUTROS PCT: 47.3 % (ref 43.0–77.0)
Platelets: 187 10*3/uL (ref 150.0–400.0)
RBC: 3.45 Mil/uL — ABNORMAL LOW (ref 4.22–5.81)
RDW: 13.9 % (ref 11.5–15.5)
WBC: 4.7 10*3/uL (ref 4.0–10.5)

## 2016-02-25 LAB — LIPID PANEL
CHOLESTEROL: 115 mg/dL (ref 0–200)
HDL: 56.1 mg/dL (ref >39.00)
LDL CALC: 46 mg/dL (ref 0–99)
NonHDL: 59.01
TRIGLYCERIDES: 66 mg/dL (ref 0.0–149.0)
Total CHOL/HDL Ratio: 2
VLDL: 13.2 mg/dL (ref 0.0–40.0)

## 2016-02-25 LAB — IBC PANEL
Iron: 108 ug/dL (ref 42–165)
SATURATION RATIOS: 31.7 % (ref 20.0–50.0)
Transferrin: 243 mg/dL (ref 212.0–360.0)

## 2016-02-25 LAB — TSH: TSH: 1.58 u[IU]/mL (ref 0.35–4.50)

## 2016-02-25 LAB — FOLATE: Folate: 18.6 ng/mL (ref >5.9)

## 2016-02-25 LAB — FERRITIN: FERRITIN: 81.1 ng/mL (ref 22.0–322.0)

## 2016-02-25 LAB — VITAMIN B12: Vitamin B-12: 349 pg/mL (ref 211–911)

## 2016-02-25 MED ORDER — LEVOTHYROXINE SODIUM 50 MCG PO TABS: 50.0000 ug | ORAL_TABLET | Freq: Every day | ORAL | Status: DC

## 2016-02-25 MED ORDER — ATORVASTATIN CALCIUM 20 MG PO TABS: ORAL_TABLET | ORAL | Status: DC

## 2016-02-25 MED ORDER — DAPSONE 100 MG PO TABS: 50.0000 mg | ORAL_TABLET | Freq: Every day | ORAL | Status: DC

## 2016-02-25 NOTE — Patient Instructions (Signed)

## 2016-02-25 NOTE — Progress Notes (Signed)
Pre visit review using our clinic review tool, if applicable. No additional management support is needed unless otherwise documented below in the visit note. 

## 2016-02-25 NOTE — Progress Notes (Signed)
Subjective:  Patient ID: [name], male    DOB: 04/01/1930  Age: 82 y.o. MRN: [id number]  CC: Annual Exam; Hypothyroidism; Hyperlipidemia; and Anemia   HPI [name] presents for a CPX/AWV.  He is due for thyroid check. He denies fatigue, edema, constipation, or changes in his weight. He has a history of chronic anemia and celiac disease but is asymptomatic with respect to this. He also has a history of atrial fibrillation but denies any recent episodes of palpitations, dizziness, or syncope.  Outpatient Prescriptions Prior to Visit  Medication Sig Dispense Refill  . cyanocobalamin 1000 MCG tablet Take 100 mcg by mouth daily.    Marland Kitchen ELIQUIS 5 MG TABS tablet TAKE 1 TABLET BY MOUTH TWICE DAILY 60 tablet 2  . metoprolol succinate (TOPROL-XL) 25 MG 24 hr tablet TAKE 1 TABLET BY MOUTH ONCE DAILY 30 tablet 2  . atorvastatin (LIPITOR) 20 MG tablet take 1 tablet by mouth once daily 30 tablet 0  . dapsone 100 MG tablet Take 0.5 tablets (50 mg total) by mouth daily. 45 tablet 0  . levothyroxine (SYNTHROID, LEVOTHROID) 50 MCG tablet Take 1 tablet (50 mcg total) by mouth daily. Please d/c any previous scripts for the same medication. Thanks 90 tablet 1   No facility-administered medications prior to visit.    ROS Review of Systems  Constitutional: Negative.  Negative for fever, chills, diaphoresis, appetite change and fatigue.  HENT: Negative.   Eyes: Negative.   Respiratory: Negative.  Negative for cough, choking, chest tightness, shortness of breath and stridor.   Cardiovascular: Negative.  Negative for chest pain, palpitations and leg swelling.  Gastrointestinal: Negative.  Negative for nausea, vomiting, abdominal pain, diarrhea, constipation and blood in stool.  Endocrine: Negative.   Genitourinary: Negative.  Negative for dysuria, urgency, hematuria and difficulty urinating.  Musculoskeletal: Negative.   Skin: Negative.  Negative for color change and rash.    Allergic/Immunologic: Negative.   Neurological: Negative.  Negative for dizziness, tremors, syncope, light-headedness, numbness and headaches.  Hematological: Negative.  Negative for adenopathy. Does not bruise/bleed easily.  Psychiatric/Behavioral: Negative.     Objective:  BP 120/64 mmHg  Pulse 85  Temp(Src) 97.9 F (36.6 C) (Oral)  Resp 16  Ht 6' 1"  (1.854 m)  Wt 170 lb (77.111 kg)  BMI 22.43 kg/m2  SpO2 96%  BP Readings from Last 3 Encounters:  02/25/16 120/64  01/08/16 118/70  03/03/15 120/68    Wt Readings from Last 3 Encounters:  02/25/16 170 lb (77.111 kg)  01/08/16 168 lb (76.204 kg)  03/03/15 162 lb (73.483 kg)    Physical Exam  Constitutional: He is oriented to person, place, and time. He appears well-developed and well-nourished. No distress.  HENT:  Mouth/Throat: Oropharynx is clear and moist. No oropharyngeal exudate.  Eyes: Conjunctivae are normal. Right eye exhibits no discharge. Left eye exhibits no discharge. No scleral icterus.  Neck: Normal range of motion. Neck supple. No JVD present. No tracheal deviation present. No thyromegaly present.  Cardiovascular: Normal rate, regular rhythm, normal heart sounds and intact distal pulses.  Exam reveals no gallop and no friction rub.   No murmur heard. Pulmonary/Chest: Effort normal and breath sounds normal. No stridor. No respiratory distress. He has no wheezes. He has no rales. He exhibits no tenderness.  Abdominal: Soft. Bowel sounds are normal. He exhibits no distension and no mass. There is no tenderness. There is no rebound and no guarding.  Musculoskeletal: Normal range of motion. He exhibits no edema  or tenderness.  Lymphadenopathy:    He has no cervical adenopathy.  Neurological: He is oriented to person, place, and time.  Skin: Skin is warm and dry. No rash noted. He is not diaphoretic. No erythema. No pallor.  Psychiatric: He has a normal mood and affect. His behavior is normal. Judgment and thought  content normal.    Lab Results  Component Value Date   WBC 4.7 02/25/2016   HGB 11.9* 02/25/2016   HCT 35.7* 02/25/2016   PLT 187.0 02/25/2016   GLUCOSE 91 01/08/2016   CHOL 115 02/25/2016   TRIG 66.0 02/25/2016   HDL 56.10 02/25/2016   LDLCALC 46 02/25/2016   ALT 26 10/03/2014   AST 36 10/03/2014   NA 131* 01/08/2016   K 5.1 01/08/2016   CL 96* 01/08/2016   CREATININE 0.72 01/08/2016   BUN 17 01/08/2016   CO2 26 01/08/2016   TSH 1.58 02/25/2016   PSA 0.26 07/13/2013   INR 1.0 06/26/2009    No results found.  Assessment & Plan:   [name] was seen today for annual exam, hypothyroidism, hyperlipidemia and anemia.  Diagnoses and all orders for this visit:  Hyperlipidemia with target LDL less than 130- he is achieved his LDL goal is doing well on the statin. -     atorvastatin (LIPITOR) 20 MG tablet; take 1 tablet by mouth once daily -     Lipid panel; Future -     TSH; Future -     Lipid panel; Future  Macrocytic anemia- his anemia is chronic and stable, his vitamin levels continue to be normal, as appears to be the anemia of chronic disease and/or caused by dapsone therapy -     CBC with Differential/Platelet; Future -     Vitamin B12; Future -     Folate; Future -     Ferritin; Future -     IBC panel; Future -     CBC with Differential/Platelet; Future -     Vitamin B12; Future -     Folate; Future -     Ferritin; Future -     IBC panel; Future  Other specified hypothyroidism- his TSH is in the normal range, he will maintain on current dose of Synthroid -     TSH; Future -     TSH; Future -     levothyroxine (SYNTHROID, LEVOTHROID) 50 MCG tablet; Take 1 tablet (50 mcg total) by mouth daily.  Paroxysmal atrial fibrillation ([name])- he has maintained good rate and rhythm control, his renal function is normal so he will remain on the current dose of Eliquis  Celiac disease- symptoms are well controlled with dapsone, other than the anemia there is no evidence of  dapsone toxicity. -     dapsone 100 MG tablet; Take 0.5 tablets (50 mg total) by mouth daily. -     CBC with Differential/Platelet; Future -     CBC with Differential/Platelet; Future  Other orders -     Cancel: levothyroxine (SYNTHROID, LEVOTHROID) 50 MCG tablet; Take 1 tablet (50 mcg total) by mouth daily. Please d/c any previous scripts for the same medication. Thanks  I have changed Mr. Tennell levothyroxine. I am also having him maintain his cyanocobalamin, ELIQUIS, metoprolol succinate, atorvastatin, and dapsone.  Meds ordered this encounter  Medications  . atorvastatin (LIPITOR) 20 MG tablet    Sig: take 1 tablet by mouth once daily    Dispense:  90 tablet    Refill:  1  .  dapsone 100 MG tablet    Sig: Take 0.5 tablets (50 mg total) by mouth daily.    Dispense:  45 tablet    Refill:  1  . levothyroxine (SYNTHROID, LEVOTHROID) 50 MCG tablet    Sig: Take 1 tablet (50 mcg total) by mouth daily.    Dispense:  90 tablet    Refill:  1   See AVS for instructions about healthy living and anticipatory guidance.  Follow-up: Return in about 6 months (around 08/27/2016).  [name], MD

## 2016-02-26 NOTE — Assessment & Plan Note (Signed)

## 2016-03-01 ENCOUNTER — Other Ambulatory Visit: Payer: Self-pay | Admitting: *Deleted

## 2016-03-02 ENCOUNTER — Other Ambulatory Visit: Payer: Self-pay | Admitting: Pharmacist Clinician (PhC)/ Clinical Pharmacy Specialist

## 2016-03-02 MED ORDER — APIXABAN 5 MG PO TABS: 5.0000 mg | ORAL_TABLET | Freq: Two times a day (BID) | ORAL | Status: DC

## 2016-03-05 ENCOUNTER — Other Ambulatory Visit: Payer: Self-pay | Admitting: *Deleted

## 2016-03-05 MED ORDER — METOPROLOL SUCCINATE ER 25 MG PO TB24: 25.0000 mg | ORAL_TABLET | Freq: Every day | ORAL | Status: DC

## 2016-03-05 NOTE — Telephone Encounter (Signed)
Rx request sent to pharmacy.  

## 2016-04-20 ENCOUNTER — Ambulatory Visit (INDEPENDENT_AMBULATORY_CARE_PROVIDER_SITE_OTHER): Payer: Medicare Other | Admitting: Family

## 2016-04-20 ENCOUNTER — Encounter: Payer: Self-pay | Admitting: Family

## 2016-04-20 ENCOUNTER — Telehealth: Payer: Self-pay | Admitting: Internal Medicine

## 2016-04-20 VITALS — BP 132/80 | HR 65 | Temp 97.7°F | Resp 14 | Ht 73.0 in | Wt 170.0 lb

## 2016-04-20 DIAGNOSIS — S39012A Strain of muscle, fascia and tendon of lower back, initial encounter: Secondary | ICD-10-CM | POA: Insufficient documentation

## 2016-04-20 MED ORDER — PREDNISONE 10 MG (21) PO TBPK: ORAL_TABLET | ORAL | Status: DC

## 2016-04-20 MED ORDER — TIZANIDINE HCL 4 MG PO TABS: 4.0000 mg | ORAL_TABLET | Freq: Four times a day (QID) | ORAL | Status: DC | PRN

## 2016-04-20 NOTE — Assessment & Plan Note (Signed)
Symptoms and exam consistent with low to mid back strain. Treat conservatively with ice/heat and home exercise therapy. Start prednisone. Start Zanaflex as needed for muscle spasm. Follow up if symptoms worsen or do not improve for further imaging.

## 2016-04-20 NOTE — Patient Instructions (Signed)
Thank you for choosing [facility].  Summary/Instructions:  Your prescription(s) have been submitted to your pharmacy or been printed and provided for you. Please take as directed and contact our office if you believe you are having problem(s) with the medication(s) or have any questions.  If your symptoms worsen or fail to improve, please contact our office for further instruction, or in case of emergency go directly to the emergency room at the closest medical facility.   Low Back Strain With Rehab A strain is an injury in which a tendon or muscle is torn. The muscles and tendons of the lower back are vulnerable to strains. However, these muscles and tendons are very strong and require a great force to be injured. Strains are classified into three categories. Grade 1 strains cause pain, but the tendon is not lengthened. Grade 2 strains include a lengthened ligament, due to the ligament being stretched or partially ruptured. With grade 2 strains there is still function, although the function may be decreased. Grade 3 strains involve a complete tear of the tendon or muscle, and function is usually impaired. SYMPTOMS   Pain in the lower back.  Pain that affects one side more than the other.  Pain that gets worse with movement and may be felt in the hip, buttocks, or back of the thigh.  Muscle spasms of the muscles in the back.  Swelling along the muscles of the back.  Loss of strength of the back muscles.  Crackling sound (crepitation) when the muscles are touched. CAUSES  Lower back strains occur when a force is placed on the muscles or tendons that is greater than they can handle. Common causes of injury include:  Prolonged overuse of the muscle-tendon units in the lower back, usually from incorrect posture.  A single violent injury or force applied to the back. RISK INCREASES WITH:  Sports that involve twisting forces on the spine or a lot of bending at the waist (football,  rugby, weightlifting, bowling, golf, tennis, speed skating, racquetball, swimming, running, gymnastics, diving).  Poor strength and flexibility.  Failure to warm up properly before activity.  Family history of lower back pain or disk disorders.  Previous back injury or surgery (especially fusion).  Poor posture with lifting, especially heavy objects.  Prolonged sitting, especially with poor posture. PREVENTION   Learn and use proper posture when sitting or lifting (maintain proper posture when sitting, lift using the knees and legs, not at the waist).  Warm up and stretch properly before activity.  Allow for adequate recovery between workouts.  Maintain physical fitness:  Strength, flexibility, and endurance.  Cardiovascular fitness. PROGNOSIS  If treated properly, lower back strains usually heal within 6 weeks. RELATED COMPLICATIONS   Recurring symptoms, resulting in a chronic problem.  Chronic inflammation, scarring, and partial muscle-tendon tear.  Delayed healing or resolution of symptoms.  Prolonged disability. TREATMENT  Treatment first involves the use of ice and medicine, to reduce pain and inflammation. The use of strengthening and stretching exercises may help reduce pain with activity. These exercises may be performed at home or with a therapist. Severe injuries may require referral to a therapist for further evaluation and treatment, such as ultrasound. Your caregiver may advise that you wear a back brace or corset, to help reduce pain and discomfort. Often, prolonged bed rest results in greater harm then benefit. Corticosteroid injections may be recommended. However, these should be reserved for the most serious cases. It is important to avoid using your back when  lifting objects. At night, sleep on your back on a firm mattress with a pillow placed under your knees. If non-surgical treatment is unsuccessful, surgery may be needed.  MEDICATION   If pain medicine  is needed, nonsteroidal anti-inflammatory medicines (aspirin and ibuprofen), or other minor pain relievers (acetaminophen), are often advised.  Do not take pain medicine for 7 days before surgery.  Prescription pain relievers may be given, if your caregiver thinks they are needed. Use only as directed and only as much as you need.  Ointments applied to the skin may be helpful.  Corticosteroid injections may be given by your caregiver. These injections should be reserved for the most serious cases, because they may only be given a certain number of times. HEAT AND COLD  Cold treatment (icing) should be applied for 10 to 15 minutes every 2 to 3 hours for inflammation and pain, and immediately after activity that aggravates your symptoms. Use ice packs or an ice massage.  Heat treatment may be used before performing stretching and strengthening activities prescribed by your caregiver, physical therapist, or athletic trainer. Use a heat pack or a warm water soak. SEEK MEDICAL CARE IF:   Symptoms get worse or do not improve in 2 to 4 weeks, despite treatment.  You develop numbness, weakness, or loss of bowel or bladder function.  New, unexplained symptoms develop. (Drugs used in treatment may produce side effects.) EXERCISES  RANGE OF MOTION (ROM) AND STRETCHING EXERCISES - Low Back Strain Most people with lower back pain will find that their symptoms get worse with excessive bending forward (flexion) or arching at the lower back (extension). The exercises which will help resolve your symptoms will focus on the opposite motion.  Your physician, physical therapist or athletic trainer will help you determine which exercises will be most helpful to resolve your lower back pain. Do not complete any exercises without first consulting with your caregiver. Discontinue any exercises which make your symptoms worse until you speak to your caregiver.  If you have pain, numbness or tingling which travels  down into your buttocks, leg or foot, the goal of the therapy is for these symptoms to move closer to your back and eventually resolve. Sometimes, these leg symptoms will get better, but your lower back pain may worsen. This is typically an indication of progress in your rehabilitation. Be very alert to any changes in your symptoms and the activities in which you participated in the 24 hours prior to the change. Sharing this information with your caregiver will allow him/her to most efficiently treat your condition.  These exercises may help you when beginning to rehabilitate your injury. Your symptoms may resolve with or without further involvement from your physician, physical therapist or athletic trainer. While completing these exercises, remember:  Restoring tissue flexibility helps normal motion to return to the joints. This allows healthier, less painful movement and activity.  An effective stretch should be held for at least 30 seconds.  A stretch should never be painful. You should only feel a gentle lengthening or release in the stretched tissue. FLEXION RANGE OF MOTION AND STRETCHING EXERCISES: STRETCH - Flexion, Single Knee to Chest   Lie on a firm bed or floor with both legs extended in front of you.  Keeping one leg in contact with the floor, bring your opposite knee to your chest. Hold your leg in place by either grabbing behind your thigh or at your knee.  Pull until you feel a gentle stretch in your  lower back. Hold __________ seconds.  Slowly release your grasp and repeat the exercise with the opposite side. Repeat __________ times. Complete this exercise __________ times per day.  STRETCH - Flexion, Double Knee to Chest   Lie on a firm bed or floor with both legs extended in front of you.  Keeping one leg in contact with the floor, bring your opposite knee to your chest.  Tense your stomach muscles to support your back and then lift your other knee to your chest. Hold  your legs in place by either grabbing behind your thighs or at your knees.  Pull both knees toward your chest until you feel a gentle stretch in your lower back. Hold __________ seconds.  Tense your stomach muscles and slowly return one leg at a time to the floor. Repeat __________ times. Complete this exercise __________ times per day.  STRETCH - Low Trunk Rotation  Lie on a firm bed or floor. Keeping your legs in front of you, bend your knees so they are both pointed toward the ceiling and your feet are flat on the floor.  Extend your arms out to the side. This will stabilize your upper body by keeping your shoulders in contact with the floor.  Gently and slowly drop both knees together to one side until you feel a gentle stretch in your lower back. Hold for __________ seconds.  Tense your stomach muscles to support your lower back as you bring your knees back to the starting position. Repeat the exercise to the other side. Repeat __________ times. Complete this exercise __________ times per day  EXTENSION RANGE OF MOTION AND FLEXIBILITY EXERCISES: STRETCH - Extension, Prone on Elbows   Lie on your stomach on the floor, a bed will be too soft. Place your palms about shoulder width apart and at the height of your head.  Place your elbows under your shoulders. If this is too painful, stack pillows under your chest.  Allow your body to relax so that your hips drop lower and make contact more completely with the floor.  Hold this position for __________ seconds.  Slowly return to lying flat on the floor. Repeat __________ times. Complete this exercise __________ times per day.  RANGE OF MOTION - Extension, Prone Press Ups  Lie on your stomach on the floor, a bed will be too soft. Place your palms about shoulder width apart and at the height of your head.  Keeping your back as relaxed as possible, slowly straighten your elbows while keeping your hips on the floor. You may adjust the  placement of your hands to maximize your comfort. As you gain motion, your hands will come more underneath your shoulders.  Hold this position __________ seconds.  Slowly return to lying flat on the floor. Repeat __________ times. Complete this exercise __________ times per day.  RANGE OF MOTION- Quadruped, Neutral Spine   Assume a hands and knees position on a firm surface. Keep your hands under your shoulders and your knees under your hips. You may place padding under your knees for comfort.  Drop your head and point your tail bone toward the ground below you. This will round out your lower back like an angry cat. Hold this position for __________ seconds.  Slowly lift your head and release your tail bone so that your back sags into a large arch, like an old horse.  Hold this position for __________ seconds.  Repeat this until you feel limber in your lower back.  Now,  find your "sweet spot." This will be the most comfortable position somewhere between the two previous positions. This is your neutral spine. Once you have found this position, tense your stomach muscles to support your lower back.  Hold this position for __________ seconds. Repeat __________ times. Complete this exercise __________ times per day.  STRENGTHENING EXERCISES - Low Back Strain These exercises may help you when beginning to rehabilitate your injury. These exercises should be done near your "sweet spot." This is the neutral, low-back arch, somewhere between fully rounded and fully arched, that is your least painful position. When performed in this safe range of motion, these exercises can be used for people who have either a flexion or extension based injury. These exercises may resolve your symptoms with or without further involvement from your physician, physical therapist or athletic trainer. While completing these exercises, remember:   Muscles can gain both the endurance and the strength needed for everyday  activities through controlled exercises.  Complete these exercises as instructed by your physician, physical therapist or athletic trainer. Increase the resistance and repetitions only as guided.  You may experience muscle soreness or fatigue, but the pain or discomfort you are trying to eliminate should never worsen during these exercises. If this pain does worsen, stop and make certain you are following the directions exactly. If the pain is still present after adjustments, discontinue the exercise until you can discuss the trouble with your caregiver. STRENGTHENING - Deep Abdominals, Pelvic Tilt  Lie on a firm bed or floor. Keeping your legs in front of you, bend your knees so they are both pointed toward the ceiling and your feet are flat on the floor.  Tense your lower abdominal muscles to press your lower back into the floor. This motion will rotate your pelvis so that your tail bone is scooping upwards rather than pointing at your feet or into the floor.  With a gentle tension and even breathing, hold this position for __________ seconds. Repeat __________ times. Complete this exercise __________ times per day.  STRENGTHENING - Abdominals, Crunches   Lie on a firm bed or floor. Keeping your legs in front of you, bend your knees so they are both pointed toward the ceiling and your feet are flat on the floor. Cross your arms over your chest.  Slightly tip your chin down without bending your neck.  Tense your abdominals and slowly lift your trunk high enough to just clear your shoulder blades. Lifting higher can put excessive stress on the lower back and does not further strengthen your abdominal muscles.  Control your return to the starting position. Repeat __________ times. Complete this exercise __________ times per day.  STRENGTHENING - Quadruped, Opposite UE/LE Lift   Assume a hands and knees position on a firm surface. Keep your hands under your shoulders and your knees under your  hips. You may place padding under your knees for comfort.  Find your neutral spine and gently tense your abdominal muscles so that you can maintain this position. Your shoulders and hips should form a rectangle that is parallel with the floor and is not twisted.  Keeping your trunk steady, lift your right hand no higher than your shoulder and then your left leg no higher than your hip. Make sure you are not holding your breath. Hold this position __________ seconds.  Continuing to keep your abdominal muscles tense and your back steady, slowly return to your starting position. Repeat with the opposite arm and leg. Repeat __________  times. Complete this exercise __________ times per day.  STRENGTHENING - Lower Abdominals, Double Knee Lift  Lie on a firm bed or floor. Keeping your legs in front of you, bend your knees so they are both pointed toward the ceiling and your feet are flat on the floor.  Tense your abdominal muscles to brace your lower back and slowly lift both of your knees until they come over your hips. Be certain not to hold your breath.  Hold __________ seconds. Using your abdominal muscles, return to the starting position in a slow and controlled manner. Repeat __________ times. Complete this exercise __________ times per day.  POSTURE AND BODY MECHANICS CONSIDERATIONS - Low Back Strain Keeping correct posture when sitting, standing or completing your activities will reduce the stress put on different body tissues, allowing injured tissues a chance to heal and limiting painful experiences. The following are general guidelines for improved posture. Your physician or physical therapist will provide you with any instructions specific to your needs. While reading these guidelines, remember:  The exercises prescribed by your provider will help you have the flexibility and strength to maintain correct postures.  The correct posture provides the best environment for your joints to work.  All of your joints have less wear and tear when properly supported by a spine with good posture. This means you will experience a healthier, less painful body.  Correct posture must be practiced with all of your activities, especially prolonged sitting and standing. Correct posture is as important when doing repetitive low-stress activities (typing) as it is when doing a single heavy-load activity (lifting). RESTING POSITIONS Consider which positions are most painful for you when choosing a resting position. If you have pain with flexion-based activities (sitting, bending, stooping, squatting), choose a position that allows you to rest in a less flexed posture. You would want to avoid curling into a fetal position on your side. If your pain worsens with extension-based activities (prolonged standing, working overhead), avoid resting in an extended position such as sleeping on your stomach. Most people will find more comfort when they rest with their spine in a more neutral position, neither too rounded nor too arched. Lying on a non-sagging bed on your side with a pillow between your knees, or on your back with a pillow under your knees will often provide some relief. Keep in mind, being in any one position for a prolonged period of time, no matter how correct your posture, can still lead to stiffness. PROPER SITTING POSTURE In order to minimize stress and discomfort on your spine, you must sit with correct posture. Sitting with good posture should be effortless for a healthy body. Returning to good posture is a gradual process. Many people can work toward this most comfortably by using various supports until they have the flexibility and strength to maintain this posture on their own. When sitting with proper posture, your ears will fall over your shoulders and your shoulders will fall over your hips. You should use the back of the chair to support your upper back. Your lower back will be in a neutral  position, just slightly arched. You may place a small pillow or folded towel at the base of your lower back for support.  When working at a desk, create an environment that supports good, upright posture. Without extra support, muscles tire, which leads to excessive strain on joints and other tissues. Keep these recommendations in mind: CHAIR:  A chair should be able to slide under your  desk when your back makes contact with the back of the chair. This allows you to work closely.  The chair's height should allow your eyes to be level with the upper part of your monitor and your hands to be slightly lower than your elbows. BODY POSITION  Your feet should make contact with the floor. If this is not possible, use a foot rest.  Keep your ears over your shoulders. This will reduce stress on your neck and lower back. INCORRECT SITTING POSTURES  If you are feeling tired and unable to assume a healthy sitting posture, do not slouch or slump. This puts excessive strain on your back tissues, causing more damage and pain. Healthier options include:  Using more support, like a lumbar pillow.  Switching tasks to something that requires you to be upright or walking.  Talking a brief walk.  Lying down to rest in a neutral-spine position. PROLONGED STANDING WHILE SLIGHTLY LEANING FORWARD  When completing a task that requires you to lean forward while standing in one place for a long time, place either foot up on a stationary 2-4 inch high object to help maintain the best posture. When both feet are on the ground, the lower back tends to lose its slight inward curve. If this curve flattens (or becomes too large), then the back and your other joints will experience too much stress, tire more quickly, and can cause pain. CORRECT STANDING POSTURES Proper standing posture should be assumed with all daily activities, even if they only take a few moments, like when brushing your teeth. As in sitting, your ears  should fall over your shoulders and your shoulders should fall over your hips. You should keep a slight tension in your abdominal muscles to brace your spine. Your tailbone should point down to the ground, not behind your body, resulting in an over-extended swayback posture.  INCORRECT STANDING POSTURES  Common incorrect standing postures include a forward head, locked knees and/or an excessive swayback. WALKING Walk with an upright posture. Your ears, shoulders and hips should all line-up. PROLONGED ACTIVITY IN A FLEXED POSITION When completing a task that requires you to bend forward at your waist or lean over a low surface, try to find a way to stabilize 3 out of 4 of your limbs. You can place a hand or elbow on your thigh or rest a knee on the surface you are reaching across. This will provide you more stability so that your muscles do not fatigue as quickly. By keeping your knees relaxed, or slightly bent, you will also reduce stress across your lower back. CORRECT LIFTING TECHNIQUES DO :   Assume a wide stance. This will provide you more stability and the opportunity to get as close as possible to the object which you are lifting.  Tense your abdominals to brace your spine. Bend at the knees and hips. Keeping your back locked in a neutral-spine position, lift using your leg muscles. Lift with your legs, keeping your back straight.  Test the weight of unknown objects before attempting to lift them.  Try to keep your elbows locked down at your sides in order get the best strength from your shoulders when carrying an object.  Always ask for help when lifting heavy or awkward objects. INCORRECT LIFTING TECHNIQUES DO NOT:   Lock your knees when lifting, even if it is a small object.  Bend and twist. Pivot at your feet or move your feet when needing to change directions.  Assume that you  can safely pick up even a paper clip without proper posture.   This information is not intended to  replace advice given to you by your health care provider. Make sure you discuss any questions you have with your health care provider.   Document Released: 12/06/2005 Document Revised: 12/27/2014 Document Reviewed: 03/20/2009 Elsevier Interactive Patient Education [employer].

## 2016-04-20 NOTE — Progress Notes (Signed)
Pre visit review using our clinic review tool, if applicable. No additional management support is needed unless otherwise documented below in the visit note. 

## 2016-04-20 NOTE — Telephone Encounter (Signed)
Patient Name: [name] DOB: [birth year] Initial Comment caller states he has a pinched nerve in his back between shoulder blades from moving patio furniture Nurse Assessment Nurse: Ronnald Ramp, RN, Miranda Date/Time (Eastern Time): 04/20/2016 1:52:41 PM Confirm and document reason for call. If symptomatic, describe symptoms. You must click the next button to save text entered. ---Caller states Saturday he was lifting a patio umbrella and twisted his back. The next day he started having pain in his upper back that is not getting better. He describes the pain as a "severe pinch" when bending or lifting his arms. Has the patient traveled out of the country within the last 30 days? ---Not Applicable Does the patient have any new or worsening symptoms? ---Yes Will a triage be completed? ---Yes Related visit to physician within the last 2 weeks? ---No Does the PT have any chronic conditions? (i.e. diabetes, asthma, etc.) ---Yes List chronic conditions. ---Thyroid, High Cholesterol Is this a behavioral health or substance abuse call? ---No Guidelines Guideline Title Affirmed Question Affirmed Notes Back Pain [1] MODERATE back pain (e.g., interferes with normal activities) AND [2] present > 3 days Final Disposition User See PCP When Office is Open (within 3 days) Ronnald Ramp, RN, Miranda Comments Last dose of extra strength Tylenol was 1 hr ago, still has severe pain with movement. No appts available with PCP in recommended time frame. Appt scheduled for 3:15 with Dr. Elna Breslow. Disagree/Comply: Comply

## 2016-04-20 NOTE — Progress Notes (Signed)
Subjective:    Patient ID: [name], male    DOB: [birth year], 82 y.o.   MRN: [id number]  Chief Complaint  Patient presents with  . Back Pain    having some back pain in upper back between shoulder blade when he moves pain shoots through upper body    HPI:  [name] is a 82 y.o. male who  has a past medical history of Nonspecific (abnormal) findings on radiological and other examination of other intrathoracic organs; Pure hypercholesterolemia; Nonspecific abnormal results of thyroid function study; Celiac disease; Unspecified hemorrhoids without mention of complication; Alcohol abuse; Prostate cancer ([place]); DJD (degenerative joint disease); Olecranon bursitis; CVA (cerebral infarction); Macrocytic anemia; Allergic dermatitis; and Atrial fibrillation ([place]). and presents today for an acute office visit.   This is a new problem. Associated symptom of pain located in his mid/lower back has been going on for approximately 3-1/2 days following lifting a patio umbrella and twisting his back. Describes the pain as a severe pinch when bending or lifting his arms. Modifying factors include Tylenol. Denies any numbness or tingling. Course of the symptoms has improved slightly but not very fast.   No Known Allergies   Current Outpatient Prescriptions on File Prior to Visit  Medication Sig Dispense Refill  . apixaban (ELIQUIS) 5 MG TABS tablet Take 1 tablet (5 mg total) by mouth 2 (two) times daily. 60 tablet 6  . atorvastatin (LIPITOR) 20 MG tablet take 1 tablet by mouth once daily 90 tablet 1  . cyanocobalamin 1000 MCG tablet Take 100 mcg by mouth daily.    . dapsone 100 MG tablet Take 0.5 tablets (50 mg total) by mouth daily. 45 tablet 1  . levothyroxine (SYNTHROID, LEVOTHROID) 50 MCG tablet Take 1 tablet (50 mcg total) by mouth daily. 90 tablet 1  . metoprolol succinate (TOPROL-XL) 25 MG 24 hr tablet Take 1 tablet (25 mg total) by mouth daily. 30 tablet 11   No current  facility-administered medications on file prior to visit.     Past Surgical History  Procedure Laterality Date  . Prostate biopsy and see implantation    . Wrist fracture surgery      Past Medical History  Diagnosis Date  . Nonspecific (abnormal) findings on radiological and other examination of other intrathoracic organs   . Pure hypercholesterolemia   . Nonspecific abnormal results of thyroid function study   . Celiac disease   . Unspecified hemorrhoids without mention of complication   . Alcohol abuse   . Prostate cancer ([place])   . DJD (degenerative joint disease)   . Olecranon bursitis   . CVA (cerebral infarction)   . Macrocytic anemia   . Allergic dermatitis   . Atrial fibrillation ([place])     Review of Systems  Constitutional: Negative for fever and chills.  Musculoskeletal: Positive for back pain.  Neurological: Negative for weakness and numbness.      Objective:    BP 132/80 mmHg  Pulse 65  Temp(Src) 97.7 F (36.5 C) (Oral)  Resp 14  Ht 6' 1"  (1.854 m)  Wt 170 lb (77.111 kg)  BMI 22.43 kg/m2  SpO2 95% Nursing note and vital signs reviewed.  Physical Exam  Constitutional: He is oriented to person, place, and time. He appears well-developed and well-nourished. No distress.  Cardiovascular: Normal rate, regular rhythm, normal heart sounds and intact distal pulses.   Pulmonary/Chest: Effort normal and breath sounds normal.  Musculoskeletal:  Thoracic/lumbar spine - no obvious deformity, discoloration, or  edema noted. Mild tenderness elicited upper lumbar lower thoracic spine. No deformities or masses noted. Range of motion is severely restricted in flexion and normal in all directions. Straight leg raise is negative. Distal pulses and sensation are intact and appropriate.  Neurological: He is alert and oriented to person, place, and time.  Skin: Skin is warm and dry.  Psychiatric: He has a normal mood and affect. His behavior is normal. Judgment and thought  content normal.       Assessment & Plan:   Problem List Items Addressed This Visit      Musculoskeletal and Integument   Low back strain - Primary    Symptoms and exam consistent with low to mid back strain. Treat conservatively with ice/heat and home exercise therapy. Start prednisone. Start Zanaflex as needed for muscle spasm. Follow up if symptoms worsen or do not improve for further imaging.       Relevant Medications   predniSONE (STERAPRED UNI-PAK 21 TAB) 10 MG (21) TBPK tablet   tiZANidine (ZANAFLEX) 4 MG tablet       I am having Mr. Samaras start on predniSONE and tiZANidine. I am also having him maintain his cyanocobalamin, atorvastatin, dapsone, levothyroxine, apixaban, and metoprolol succinate.   Meds ordered this encounter  Medications  . predniSONE (STERAPRED UNI-PAK 21 TAB) 10 MG (21) TBPK tablet    Sig: Take 6 tablets x 1 [name], 5 tablets x 1 [name], 4 tablets x 1 [name], 3 tablets x 1 [name], 2 tablets x 1 [name], 1 tablet x 1 [name]    Dispense:  21 tablet    Refill:  0    Order Specific Question:  Supervising Provider    Answer:  Pricilla Holm A [[id number]]  . tiZANidine (ZANAFLEX) 4 MG tablet    Sig: Take 1 tablet (4 mg total) by mouth every 6 (six) hours as needed for muscle spasms.    Dispense:  30 tablet    Refill:  0    Order Specific Question:  Supervising Provider    Answer:  Pricilla Holm A [[id number]]     Follow-up: Return if symptoms worsen or fail to improve.  Mauricio Po, FNP

## 2016-05-05 ENCOUNTER — Other Ambulatory Visit: Payer: Self-pay

## 2016-05-05 DIAGNOSIS — S39012A Strain of muscle, fascia and tendon of lower back, initial encounter: Secondary | ICD-10-CM

## 2016-05-05 MED ORDER — TIZANIDINE HCL 4 MG PO TABS
4.0000 mg | ORAL_TABLET | Freq: Four times a day (QID) | ORAL | Status: DC | PRN
Start: 2016-05-05 — End: 2016-06-24

## 2016-06-24 ENCOUNTER — Encounter: Payer: Self-pay | Admitting: Family

## 2016-06-24 ENCOUNTER — Ambulatory Visit (INDEPENDENT_AMBULATORY_CARE_PROVIDER_SITE_OTHER): Payer: Medicare Other | Admitting: Family

## 2016-06-24 ENCOUNTER — Ambulatory Visit: Payer: Medicare Other | Admitting: Family

## 2016-06-24 VITALS — BP 112/68 | HR 81 | Temp 98.5°F | Ht 73.0 in | Wt 167.0 lb

## 2016-06-24 DIAGNOSIS — S39012D Strain of muscle, fascia and tendon of lower back, subsequent encounter: Secondary | ICD-10-CM

## 2016-06-24 MED ORDER — TIZANIDINE HCL 4 MG PO CAPS: 4.0000 mg | ORAL_CAPSULE | Freq: Every evening | ORAL | Status: DC | PRN

## 2016-06-24 MED ORDER — DICLOFENAC SODIUM 1 % TD GEL: 4.0000 g | Freq: Four times a day (QID) | TRANSDERMAL | Status: DC

## 2016-06-24 NOTE — Progress Notes (Signed)
Pre visit review using our clinic review tool, if applicable. No additional management support is needed unless otherwise documented below in the visit note. 

## 2016-06-24 NOTE — Patient Instructions (Signed)
Let's treat conservatively as we discussed.   Over-the-counter medications you may try for arthritic pain include:   ThermaCare patches   Capsaicin cream   Icy hot  Home exercises as we discussed below/handout.  If conservative treatment doesn't yield results, we will consider physical therapy, consult to Sports Medicine/Orthopedics for further evaluation, and imaging.   If there is no improvement in your symptoms, or if there is any worsening of symptoms, or if you have any additional concerns, please return for re-evaluation; or, if we are closed, consider going to the Emergency Room for evaluation if symptoms urgent.

## 2016-06-24 NOTE — Progress Notes (Signed)
Subjective:    Patient ID: [name], male    DOB: Jun 29, 1930, 82 y.o.   MRN: [id number]   [name] is a 82 y.o. male who presents today for an acute visit.    HPI Comments: Patient here for right sided low back pain over right hip started 2 weeks ago after lifting 'big flower pot.' No pain with sitting still such as in the exam room now. Pain is worse in the morning. Improves as the days goes on, with Tyelonol, and with massage. Pain worse with bending forward or using right arm. Some relief with heat.  No pain with urination, hematuria, history of kidney stones. No fever, chills.  Patient was seen 5/2 for back sprain and was given Prednisone and Zanaflex and excercises with almost complete resolve of back pain.   Past Medical History  Diagnosis Date  . Nonspecific (abnormal) findings on radiological and other examination of other intrathoracic organs   . Pure hypercholesterolemia   . Nonspecific abnormal results of thyroid function study   . Celiac disease   . Unspecified hemorrhoids without mention of complication   . Alcohol abuse   . Prostate cancer ([place])   . DJD (degenerative joint disease)   . Olecranon bursitis   . CVA (cerebral infarction)   . Macrocytic anemia   . Allergic dermatitis   . Atrial fibrillation ([facility])    Allergies: Review of patient's allergies indicates no known allergies. Current Outpatient Prescriptions on File Prior to Visit  Medication Sig Dispense Refill  . apixaban (ELIQUIS) 5 MG TABS tablet Take 1 tablet (5 mg total) by mouth 2 (two) times daily. 60 tablet 6  . atorvastatin (LIPITOR) 20 MG tablet take 1 tablet by mouth once daily 90 tablet 1  . cyanocobalamin 1000 MCG tablet Take 100 mcg by mouth daily.    . dapsone 100 MG tablet Take 0.5 tablets (50 mg total) by mouth daily. 45 tablet 1  . levothyroxine (SYNTHROID, LEVOTHROID) 50 MCG tablet Take 1 tablet (50 mcg total) by mouth daily. 90 tablet 1  . metoprolol succinate (TOPROL-XL) 25 MG  24 hr tablet Take 1 tablet (25 mg total) by mouth daily. 30 tablet 11  . tiZANidine (ZANAFLEX) 4 MG tablet Take 1 tablet (4 mg total) by mouth every 6 (six) hours as needed for muscle spasms. 30 tablet 0   No current facility-administered medications on file prior to visit.    Social History  Substance Use Topics  . Smoking status: Never Smoker   . Smokeless tobacco: None  . Alcohol Use: Yes     Comment: 3-4 beers per day    Review of Systems  Constitutional: Negative for fever and chills.  Respiratory: Negative for cough.   Cardiovascular: Negative for chest pain and palpitations.  Gastrointestinal: Negative for nausea and vomiting.  Genitourinary: Negative for dysuria, frequency, hematuria and flank pain.  Musculoskeletal: Positive for back pain.      Objective:    BP 112/68 mmHg  Pulse 81  Temp(Src) 98.5 F (36.9 C) (Oral)  Ht 6' 1"  (1.854 m)  Wt 167 lb (75.751 kg)  BMI 22.04 kg/m2  SpO2 98%   Physical Exam  Constitutional: He appears well-developed and well-nourished.  Cardiovascular: Regular rhythm and normal heart sounds.   Pulmonary/Chest: Effort normal and breath sounds normal. No respiratory distress. He has no wheezes. He has no rhonchi. He has no rales.  Abdominal: Soft. Normal appearance and bowel sounds are normal. He exhibits no distension, no  fluid wave, no ascites and no mass. There is no tenderness. There is no rigidity, no rebound, no guarding and no CVA tenderness.  Musculoskeletal:       Lumbar back: He exhibits normal range of motion, no tenderness, no swelling, no pain and no spasm.  Full range of motion with flexion, extension, lateral side bends. No pain, numbness, tingling elicited with single leg raise bilaterally. No rash.  Lymphadenopathy:       Head (left side): No submandibular and no preauricular adenopathy present.  Neurological: He is alert.  Skin: Skin is warm and dry.  Psychiatric: He has a normal mood and affect. His speech is normal  and behavior is normal.  Vitals reviewed.      Assessment & Plan:  1. Low back strain, subsequent encounter Pain a/w movement and started after lifting heavy pot. Improves with massage, heat supporting MS etiology. Pending UA ( patient unable to go while in office) to ensure no hematuria to suggest renal stone. Low suspicion of this as pain is not constant, colicky, or severe.  We agreed on conservative management.  - diclofenac sodium (VOLTAREN) 1 % GEL; Apply 4 g topically 4 (four) times daily.  Dispense: 1 Tube; Refill: 3 - tiZANidine (ZANAFLEX) 4 MG capsule; Take 1 capsule (4 mg total) by mouth at bedtime as needed for muscle spasms.  Dispense: 14 capsule; Refill: 0     I have discontinued Mr. Hamme predniSONE. I am also having him maintain his cyanocobalamin, atorvastatin, dapsone, levothyroxine, apixaban, metoprolol succinate, and tiZANidine.   No orders of the defined types were placed in this encounter.     Start medications as prescribed and explained to patient on After Visit Summary ( AVS). Risks, benefits, and alternatives of the medications and treatment plan prescribed today were discussed, and patient expressed understanding.   Education regarding symptom management and diagnosis given to patient.   Follow-up:Plan follow-up and return precautions given if any worsening symptoms or change in condition.   Continue to follow with [name], MD for routine health maintenance.   Steward Drone and I agreed with plan.   Mable Paris, FNP

## 2016-06-25 ENCOUNTER — Other Ambulatory Visit (INDEPENDENT_AMBULATORY_CARE_PROVIDER_SITE_OTHER): Payer: Medicare Other

## 2016-06-25 DIAGNOSIS — S39012A Strain of muscle, fascia and tendon of lower back, initial encounter: Secondary | ICD-10-CM | POA: Diagnosis not present

## 2016-06-25 DIAGNOSIS — S39012D Strain of muscle, fascia and tendon of lower back, subsequent encounter: Secondary | ICD-10-CM

## 2016-06-25 LAB — URINALYSIS, ROUTINE W REFLEX MICROSCOPIC
Bilirubin Urine: NEGATIVE
Hgb urine dipstick: NEGATIVE
Ketones, ur: NEGATIVE
Leukocytes, UA: NEGATIVE
Nitrite: NEGATIVE
SPECIFIC GRAVITY, URINE: 1.015 (ref 1.000–1.030)
TOTAL PROTEIN, URINE-UPE24: NEGATIVE
URINE GLUCOSE: NEGATIVE
Urobilinogen, UA: 1 (ref 0.0–1.0)
pH: 7 (ref 5.0–8.0)

## 2016-09-06 ENCOUNTER — Other Ambulatory Visit: Payer: Self-pay | Admitting: Internal Medicine

## 2016-09-06 DIAGNOSIS — E785 Hyperlipidemia, unspecified: Secondary | ICD-10-CM

## 2016-09-06 DIAGNOSIS — E038 Other specified hypothyroidism: Secondary | ICD-10-CM

## 2016-10-08 ENCOUNTER — Other Ambulatory Visit: Payer: Self-pay | Admitting: Cardiology

## 2016-10-25 ENCOUNTER — Ambulatory Visit (INDEPENDENT_AMBULATORY_CARE_PROVIDER_SITE_OTHER): Payer: Medicare Other | Admitting: *Deleted

## 2016-10-25 DIAGNOSIS — Z23 Encounter for immunization: Secondary | ICD-10-CM | POA: Diagnosis not present

## 2016-11-03 ENCOUNTER — Other Ambulatory Visit: Payer: Self-pay | Admitting: Internal Medicine

## 2016-11-03 DIAGNOSIS — K900 Celiac disease: Secondary | ICD-10-CM

## 2017-01-11 NOTE — Progress Notes (Signed)
HPI: FU atrial fibrillation. Patient had electrocardiogram on 07/20/2013 that showed atrial fibrillation. We treated with rate control/anticoagulation. Echocardiogram repeated February 2017 and showed normal LV systolic function, restricted posterior mitral valve leaflet with moderate to severe mitral regurgitation, moderate biatrial enlargement and moderate tricuspid regurgitation. Since last seen, the patient has dyspnea with more extreme activities but not with routine activities. It is relieved with rest. It is not associated with chest pain. There is no orthopnea, PND or pedal edema. There is no syncope or palpitations. There is no exertional chest pain.   Current Outpatient Prescriptions  Medication Sig Dispense Refill  . atorvastatin (LIPITOR) 20 MG tablet take 1 tablet by mouth once daily 90 tablet 1  . cyanocobalamin 1000 MCG tablet Take 100 mcg by mouth daily.    . dapsone 100 MG tablet take 1/2 tablet by mouth once daily 45 tablet 1  . ELIQUIS 5 MG TABS tablet take 1 tablet by mouth twice a day 60 tablet 4  . levothyroxine (SYNTHROID, LEVOTHROID) 50 MCG tablet take 1 tablet by mouth once daily 90 tablet 1  . metoprolol succinate (TOPROL-XL) 25 MG 24 hr tablet Take 1 tablet (25 mg total) by mouth daily. 30 tablet 11   No current facility-administered medications for this visit.      Past Medical History:  Diagnosis Date  . Alcohol abuse   . Allergic dermatitis   . Atrial fibrillation ([place])   . Celiac disease   . CVA (cerebral infarction)   . DJD (degenerative joint disease)   . Macrocytic anemia   . Nonspecific (abnormal) findings on radiological and other examination of other intrathoracic organs   . Nonspecific abnormal results of thyroid function study   . Olecranon bursitis   . Prostate cancer ([place])   . Pure hypercholesterolemia   . Unspecified hemorrhoids without mention of complication     Past Surgical History:  Procedure Laterality Date  . prostate  biopsy and see implantation    . WRIST FRACTURE SURGERY      Social History   Social History  . Marital status: Married    Spouse name: N/A  . Number of children: 1  . 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 Smoking status: Never Smoker  . Smokeless tobacco: Never Used  . Alcohol use Yes     Comment: 3-4 beers per day  . Drug use: Unknown  . Sexual activity: Not on file   Other Topics Concern  . Not on file   Social History Narrative  . No narrative on file    Family History  Problem Relation Age of Onset  . Heart disease      No family history    ROS: no fevers or chills, productive cough, hemoptysis, dysphasia, odynophagia, melena, hematochezia, dysuria, hematuria, rash, seizure activity, orthopnea, PND, pedal edema, claudication. Remaining systems are negative.  Physical Exam: Well-developed well-nourished in no acute distress.  Skin is warm and dry.  HEENT is normal.  Neck is supple.  Chest is clear to auscultation with normal expansion.  Cardiovascular exam is irregular, 3/6 systolic murmur apex Abdominal exam nontender or distended. No masses palpated. Extremities show no edema. neuro grossly intact  ECG-Atrial fibrillation with PVCs or aberrantly conducted beats. Nonspecific ST changes. Cannot rule out prior septal infarct or lateral infarct.  A/P  1 Permanent atrial fibrillation-continue beta blocker for rate control. Continue apixaban. Check  hemoglobin and renal function.  2. Hyperlipidemia-continue statin. Check lipids and liver.  3 Mitral regurgitation-I recommended a repeat echocardiogram today to assess mitral regurgitation, LV function and left ventricular size. I explained that he may require valve surgery in the future if he develops symptoms, left ventricular enlargement with reduced LV function. He would like to not pursue this at this point. I explained that we could potentially  miss his LV function deteriorating. He still declined and will consider this again in one year. I would like to be conservative given his age if possible.  Kirk Ruths, MD

## 2017-01-20 ENCOUNTER — Encounter: Payer: Self-pay | Admitting: Cardiology

## 2017-01-20 ENCOUNTER — Ambulatory Visit (INDEPENDENT_AMBULATORY_CARE_PROVIDER_SITE_OTHER): Payer: Medicare Other | Admitting: Cardiology

## 2017-01-20 VITALS — BP 118/70 | HR 68 | Ht 73.5 in | Wt 168.5 lb

## 2017-01-20 DIAGNOSIS — I340 Nonrheumatic mitral (valve) insufficiency: Secondary | ICD-10-CM | POA: Diagnosis not present

## 2017-01-20 DIAGNOSIS — I4891 Unspecified atrial fibrillation: Secondary | ICD-10-CM

## 2017-01-20 DIAGNOSIS — E7800 Pure hypercholesterolemia, unspecified: Secondary | ICD-10-CM | POA: Diagnosis not present

## 2017-01-20 LAB — CBC
HCT: 36.1 % — ABNORMAL LOW (ref 38.5–50.0)
Hemoglobin: 11.6 g/dL — ABNORMAL LOW (ref 13.2–17.1)
MCH: 33.5 pg — ABNORMAL HIGH (ref 27.0–33.0)
MCHC: 32.1 g/dL (ref 32.0–36.0)
MCV: 104.3 fL — AB (ref 80.0–100.0)
MPV: 10.5 fL (ref 7.5–12.5)
PLATELETS: 216 10*3/uL (ref 140–400)
RBC: 3.46 MIL/uL — ABNORMAL LOW (ref 4.20–5.80)
RDW: 13.8 % (ref 11.0–15.0)
WBC: 4.2 10*3/uL (ref 3.8–10.8)

## 2017-01-20 NOTE — Patient Instructions (Signed)

## 2017-01-21 ENCOUNTER — Encounter: Payer: Self-pay | Admitting: *Deleted

## 2017-01-21 LAB — BASIC METABOLIC PANEL
BUN: 16 mg/dL (ref 7–25)
CHLORIDE: 101 mmol/L (ref 98–110)
CO2: 26 mmol/L (ref 20–31)
CREATININE: 0.86 mg/dL (ref 0.70–1.11)
Calcium: 9.5 mg/dL (ref 8.6–10.3)
Glucose, Bld: 93 mg/dL (ref 65–99)
Potassium: 5.1 mmol/L (ref 3.5–5.3)
Sodium: 136 mmol/L (ref 135–146)

## 2017-01-21 LAB — LIPID PANEL
CHOL/HDL RATIO: 2.2 ratio (ref <5.0)
Cholesterol: 113 mg/dL (ref <200)
HDL: 51 mg/dL (ref >40)
LDL CALC: 46 mg/dL (ref <100)
Triglycerides: 80 mg/dL (ref <150)
VLDL: 16 mg/dL (ref <30)

## 2017-01-21 LAB — HEPATIC FUNCTION PANEL
ALBUMIN: 4.2 g/dL (ref 3.6–5.1)
ALK PHOS: 70 U/L (ref 40–115)
ALT: 43 U/L (ref 9–46)
AST: 55 U/L — ABNORMAL HIGH (ref 10–35)
BILIRUBIN TOTAL: 0.9 mg/dL (ref 0.2–1.2)
Bilirubin, Direct: 0.2 mg/dL (ref <=0.2)
Indirect Bilirubin: 0.7 mg/dL (ref 0.2–1.2)
Total Protein: 7.5 g/dL (ref 6.1–8.1)

## 2017-02-09 DIAGNOSIS — L570 Actinic keratosis: Secondary | ICD-10-CM | POA: Diagnosis not present

## 2017-02-09 DIAGNOSIS — L119 Acantholytic disorder, unspecified: Secondary | ICD-10-CM | POA: Diagnosis not present

## 2017-02-09 DIAGNOSIS — L814 Other melanin hyperpigmentation: Secondary | ICD-10-CM | POA: Diagnosis not present

## 2017-02-09 DIAGNOSIS — Z85828 Personal history of other malignant neoplasm of skin: Secondary | ICD-10-CM | POA: Diagnosis not present

## 2017-02-09 DIAGNOSIS — D485 Neoplasm of uncertain behavior of skin: Secondary | ICD-10-CM | POA: Diagnosis not present

## 2017-03-08 ENCOUNTER — Ambulatory Visit (INDEPENDENT_AMBULATORY_CARE_PROVIDER_SITE_OTHER): Payer: Medicare Other | Admitting: Nurse Practitioner

## 2017-03-08 ENCOUNTER — Encounter: Payer: Self-pay | Admitting: Nurse Practitioner

## 2017-03-08 VITALS — BP 128/80 | HR 95 | Temp 97.4°F | Ht 73.5 in | Wt 175.0 lb

## 2017-03-08 DIAGNOSIS — L03115 Cellulitis of right lower limb: Secondary | ICD-10-CM

## 2017-03-08 DIAGNOSIS — R600 Localized edema: Secondary | ICD-10-CM | POA: Diagnosis not present

## 2017-03-08 MED ORDER — DOXYCYCLINE HYCLATE 100 MG PO TABS: 100.0000 mg | ORAL_TABLET | Freq: Two times a day (BID) | ORAL | 0 refills | Status: AC

## 2017-03-08 MED ORDER — FUROSEMIDE 40 MG PO TABS: 40.0000 mg | ORAL_TABLET | Freq: Every day | ORAL | 0 refills | Status: DC

## 2017-03-08 MED ORDER — SUPPORT COMPRESSION SOCK MENS MISC: 1.0000 "application " | Freq: Every day | 0 refills | Status: DC

## 2017-03-08 NOTE — Progress Notes (Signed)
Subjective:  Patient ID: [name], male    DOB: Jan 26, 1930  Age: 83 y.o. MRN: [id number]  CC: Leg Swelling (boths legs swelling for 4 days. )   HPI  Bilateral LE edema:  edema x 2weeks, pain with walking, redness of LE, no recent travel. No leg injury No SOB, no anorexia, no ABD fullness, no GI or GU symptoms, no PND or orthopnea.  Outpatient Medications Prior to Visit  Medication Sig Dispense Refill  . atorvastatin (LIPITOR) 20 MG tablet take 1 tablet by mouth once daily 90 tablet 1  . cyanocobalamin 1000 MCG tablet Take 100 mcg by mouth daily.    . dapsone 100 MG tablet take 1/2 tablet by mouth once daily 45 tablet 1  . ELIQUIS 5 MG TABS tablet take 1 tablet by mouth twice a day 60 tablet 4  . levothyroxine (SYNTHROID, LEVOTHROID) 50 MCG tablet take 1 tablet by mouth once daily 90 tablet 1  . metoprolol succinate (TOPROL-XL) 25 MG 24 hr tablet Take 1 tablet (25 mg total) by mouth daily. 30 tablet 11   No facility-administered medications prior to visit.     ROS See HPI  Objective:  BP 128/80   Pulse 95   Temp 97.4 F (36.3 C)   Ht 6' 1.5" (1.867 m)   Wt 175 lb (79.4 kg)   SpO2 95%   BMI 22.78 kg/m   BP Readings from Last 3 Encounters:  03/08/17 128/80  01/20/17 118/70  06/24/16 112/68    Wt Readings from Last 3 Encounters:  03/08/17 175 lb (79.4 kg)  01/20/17 168 lb 8 oz (76.4 kg)  06/24/16 167 lb (75.8 kg)    Physical Exam  Constitutional: He is oriented to person, place, and time.  Cardiovascular: Normal rate, regular rhythm and normal heart sounds.   Pulmonary/Chest: Effort normal and breath sounds normal.  Abdominal: Soft. Bowel sounds are normal.  Musculoskeletal: He exhibits edema and tenderness.  Neurological: He is alert and oriented to person, place, and time.  Vitals reviewed.   Lab Results  Component Value Date   WBC 4.2 01/20/2017   HGB 11.6 (L) 01/20/2017   HCT 36.1 (L) 01/20/2017   PLT 216 01/20/2017   GLUCOSE 93 01/20/2017   CHOL 113 01/20/2017   TRIG 80 01/20/2017   HDL 51 01/20/2017   LDLCALC 46 01/20/2017   ALT 43 01/20/2017   AST 55 (H) 01/20/2017   NA 136 01/20/2017   K 5.1 01/20/2017   CL 101 01/20/2017   CREATININE 0.86 01/20/2017   BUN 16 01/20/2017   CO2 26 01/20/2017   TSH 1.58 02/25/2016   PSA 0.26 07/13/2013   INR 1.0 06/26/2009    No results found.  Assessment & Plan:   Demarlo was seen today for leg swelling.  Diagnoses and all orders for this visit:  Bilateral leg edema -     VAS [place] LOWER EXTREMITY VENOUS (DVT); Future -     furosemide (LASIX) 40 MG tablet; Take 1 tablet (40 mg total) by mouth daily. -     Elastic Bandages & Supports (SUPPORT COMPRESSION SOCK MENS) MISC; 1 application by Does not apply route daily. Medium compression  Cellulitis of right lower extremity -     VAS [place] LOWER EXTREMITY VENOUS (DVT); Future -     doxycycline (VIBRA-TABS) 100 MG tablet; Take 1 tablet (100 mg total) by mouth 2 (two) times daily.   I am having Mr. Demery start on furosemide, doxycycline, and SUPPORT COMPRESSION  SOCK MENS. I am also having him maintain his cyanocobalamin, metoprolol succinate, atorvastatin, levothyroxine, ELIQUIS, and dapsone.  Meds ordered this encounter  Medications  . furosemide (LASIX) 40 MG tablet    Sig: Take 1 tablet (40 mg total) by mouth daily.    Dispense:  5 tablet    Refill:  0    Order Specific Question:   Supervising Provider    Answer:   Cassandria Anger [[id number]]  . doxycycline (VIBRA-TABS) 100 MG tablet    Sig: Take 1 tablet (100 mg total) by mouth 2 (two) times daily.    Dispense:  14 tablet    Refill:  0    Order Specific Question:   Supervising Provider    Answer:   Cassandria Anger [[id number]]  . Elastic Bandages & Supports (SUPPORT COMPRESSION SOCK MENS) MISC    Sig: 1 application by Does not apply route daily. Medium compression    Dispense:  1 each    Refill:  0    Order Specific Question:   Supervising Provider    Answer:   Cassandria Anger [[id number]]    Follow-up: Return in about 3 days (around 03/11/2017) for LE edema and cellulitis.  Wilfred Lacy, NP

## 2017-03-08 NOTE — Progress Notes (Signed)
Pre visit review using our clinic review tool, if applicable. No additional management support is needed unless otherwise documented below in the visit note. 

## 2017-03-08 NOTE — Patient Instructions (Addendum)
Elevate legs as much as possible. Wear knee high compression stocking during the day and off at night.  Venous doppler negative for DVT.  Edema Edema is when you have too much fluid in your body or under your skin. Edema may make your legs, feet, and ankles swell up. Swelling is also common in looser tissues, like around your eyes. This is a common condition. It gets more common as you get older. There are many possible causes of edema. Eating too much salt (sodium) and being on your feet or sitting for a long time can cause edema in your legs, feet, and ankles. Hot weather may make edema worse. Edema is usually painless. Your skin may look swollen or shiny. Follow these instructions at home:  Keep the swollen body part raised (elevated) above the level of your heart when you are sitting or lying down.  Do not sit still or stand for a long time.  Do not wear tight clothes. Do not wear garters on your upper legs.  Exercise your legs. This can help the swelling go down.  Wear elastic bandages or support stockings as told by your doctor.  Eat a low-salt (low-sodium) diet to reduce fluid as told by your doctor.  Depending on the cause of your swelling, you may need to limit how much fluid you drink (fluid restriction).  Take over-the-counter and prescription medicines only as told by your doctor. Contact a doctor if:  Treatment is not working.  You have heart, liver, or kidney disease and have symptoms of edema.  You have sudden and unexplained weight gain. Get help right away if:  You have shortness of breath or chest pain.  You cannot breathe when you lie down.  You have pain, redness, or warmth in the swollen areas.  You have heart, liver, or kidney disease and get edema all of a sudden.  You have a fever and your symptoms get worse all of a sudden. Summary  Edema is when you have too much fluid in your body or under your skin.  Edema may make your legs, feet, and ankles  swell up. Swelling is also common in looser tissues, like around your eyes.  Raise (elevate) the swollen body part above the level of your heart when you are sitting or lying down.  Follow your doctor's instructions about diet and how much fluid you can drink (fluid restriction). This information is not intended to replace advice given to you by your health care provider. Make sure you discuss any questions you have with your health care provider. Document Released: 05/24/2008 Document Revised: 12/24/2016 Document Reviewed: 12/24/2016 Elsevier Interactive Patient Education  2017 [employer].

## 2017-03-09 ENCOUNTER — Other Ambulatory Visit: Payer: Self-pay | Admitting: Cardiology

## 2017-03-09 ENCOUNTER — Ambulatory Visit (HOSPITAL_COMMUNITY)
Admission: RE | Admit: 2017-03-09 | Discharge: 2017-03-09 | Disposition: A | Discharge status: Home / Self Care | Payer: Medicare Other | Source: Ambulatory Visit | Attending: Nurse Practitioner | Admitting: Nurse Practitioner

## 2017-03-09 ENCOUNTER — Other Ambulatory Visit: Payer: Self-pay | Admitting: Internal Medicine

## 2017-03-09 DIAGNOSIS — E038 Other specified hypothyroidism: Secondary | ICD-10-CM

## 2017-03-09 DIAGNOSIS — R600 Localized edema: Secondary | ICD-10-CM | POA: Insufficient documentation

## 2017-03-09 DIAGNOSIS — E785 Hyperlipidemia, unspecified: Secondary | ICD-10-CM

## 2017-03-09 DIAGNOSIS — L03115 Cellulitis of right lower limb: Secondary | ICD-10-CM | POA: Diagnosis not present

## 2017-03-09 NOTE — Progress Notes (Signed)
*  PRELIMINARY RESULTS* Vascular Ultrasound Lower extremity venous duplex has been completed.  Preliminary findings: No evidence of DVT or baker's cyst.   Called results to [facility].   Landry Mellow, RDMS, RVT  03/09/2017, 10:23 AM

## 2017-03-11 ENCOUNTER — Encounter: Payer: Self-pay | Admitting: Nurse Practitioner

## 2017-03-11 ENCOUNTER — Ambulatory Visit (INDEPENDENT_AMBULATORY_CARE_PROVIDER_SITE_OTHER): Payer: Medicare Other | Admitting: Nurse Practitioner

## 2017-03-11 ENCOUNTER — Other Ambulatory Visit (INDEPENDENT_AMBULATORY_CARE_PROVIDER_SITE_OTHER): Payer: Medicare Other

## 2017-03-11 VITALS — BP 122/68 | HR 91 | Temp 97.8°F | Ht 73.5 in | Wt 169.0 lb

## 2017-03-11 DIAGNOSIS — E038 Other specified hypothyroidism: Secondary | ICD-10-CM | POA: Diagnosis not present

## 2017-03-11 DIAGNOSIS — E785 Hyperlipidemia, unspecified: Secondary | ICD-10-CM | POA: Diagnosis not present

## 2017-03-11 DIAGNOSIS — R600 Localized edema: Secondary | ICD-10-CM | POA: Diagnosis not present

## 2017-03-11 DIAGNOSIS — L03115 Cellulitis of right lower limb: Secondary | ICD-10-CM

## 2017-03-11 LAB — BASIC METABOLIC PANEL
BUN: 21 mg/dL (ref 6–23)
CHLORIDE: 99 meq/L (ref 96–112)
CO2: 27 mEq/L (ref 19–32)
CREATININE: 0.8 mg/dL (ref 0.40–1.50)
Calcium: 9.2 mg/dL (ref 8.4–10.5)
GFR: 97.75 mL/min (ref >60.00)
GLUCOSE: 97 mg/dL (ref 70–99)
Potassium: 4.2 mEq/L (ref 3.5–5.1)
Sodium: 133 mEq/L — ABNORMAL LOW (ref 135–145)

## 2017-03-11 LAB — T4, FREE: Free T4: 1.12 ng/dL (ref 0.60–1.60)

## 2017-03-11 LAB — TSH: TSH: 4.38 u[IU]/mL (ref 0.35–4.50)

## 2017-03-11 MED ORDER — LEVOTHYROXINE SODIUM 50 MCG PO TABS: 50.0000 ug | ORAL_TABLET | Freq: Every day | ORAL | 0 refills | Status: DC

## 2017-03-11 MED ORDER — FUROSEMIDE 40 MG PO TABS: 80.0000 mg | ORAL_TABLET | Freq: Every day | ORAL | 0 refills | Status: DC

## 2017-03-11 MED ORDER — ATORVASTATIN CALCIUM 20 MG PO TABS: 20.0000 mg | ORAL_TABLET | Freq: Every day | ORAL | 0 refills | Status: DC

## 2017-03-11 MED ORDER — FUROSEMIDE 40 MG PO TABS: 20.0000 mg | ORAL_TABLET | Freq: Every day | ORAL | 0 refills | Status: DC

## 2017-03-11 MED ORDER — CEFTRIAXONE SODIUM 500 MG IJ SOLR
500.0000 mg | Freq: Once | INTRAMUSCULAR | Status: AC
  Administered 2017-03-11: 500 mg via INTRAMUSCULAR

## 2017-03-11 NOTE — Progress Notes (Signed)
Subjective:  Patient ID: [name], male    DOB: Apr 14, 1930  Age: 83 y.o. MRN: [id number]  CC: Follow-up (follow up leg edema/leg still swelling. lipitor and thyroid med refill until see Jones on 03/24/17?)   HPI Bilateral Edema and right LE cellulitis: Reports no improvement in redness or swelling. Denies any pain or fever. Venous doppler negative for DVT. Current use of doxycycline for right LE cellulitis.  Outpatient Medications Prior to Visit  Medication Sig Dispense Refill  . cyanocobalamin 1000 MCG tablet Take 100 mcg by mouth daily.    . dapsone 100 MG tablet take 1/2 tablet by mouth once daily 45 tablet 1  . doxycycline (VIBRA-TABS) 100 MG tablet Take 1 tablet (100 mg total) by mouth 2 (two) times daily. 14 tablet 0  . ELIQUIS 5 MG TABS tablet take 1 tablet by mouth twice a day 60 tablet 4  . metoprolol succinate (TOPROL-XL) 25 MG 24 hr tablet take 1 tablet by mouth once daily 30 tablet 11  . atorvastatin (LIPITOR) 20 MG tablet take 1 tablet by mouth once daily 90 tablet 1  . furosemide (LASIX) 40 MG tablet Take 1 tablet (40 mg total) by mouth daily. 5 tablet 0  . levothyroxine (SYNTHROID, LEVOTHROID) 50 MCG tablet take 1 tablet by mouth once daily 90 tablet 1  . Elastic Bandages & Supports (SUPPORT COMPRESSION SOCK MENS) MISC 1 application by Does not apply route daily. Medium compression (Patient not taking: Reported on 03/11/2017) 1 each 0   No facility-administered medications prior to visit.     ROS See HPI  Objective:  BP 122/68   Pulse 91   Temp 97.8 F (36.6 C)   Ht 6' 1.5" (1.867 m)   Wt 169 lb (76.7 kg)   SpO2 97%   BMI 21.99 kg/m   BP Readings from Last 3 Encounters:  03/11/17 122/68  03/08/17 128/80  01/20/17 118/70    Wt Readings from Last 3 Encounters:  03/11/17 169 lb (76.7 kg)  03/08/17 175 lb (79.4 kg)  01/20/17 168 lb 8 oz (76.4 kg)    Physical Exam  Constitutional: He is oriented to person, place, and time. No distress.  Neck:  Normal range of motion. Neck supple.  Cardiovascular: Normal rate.   Pulmonary/Chest: Effort normal.  Musculoskeletal: He exhibits edema. He exhibits no tenderness.  Neurological: He is alert and oriented to person, place, and time.  Skin: Skin is warm and dry. There is erythema.  Persistent right LE erythematous, no tenderness.  Vitals reviewed.   Lab Results  Component Value Date   WBC 4.2 01/20/2017   HGB 11.6 (L) 01/20/2017   HCT 36.1 (L) 01/20/2017   PLT 216 01/20/2017   GLUCOSE 97 03/11/2017   CHOL 113 01/20/2017   TRIG 80 01/20/2017   HDL 51 01/20/2017   LDLCALC 46 01/20/2017   ALT 43 01/20/2017   AST 55 (H) 01/20/2017   NA 133 (L) 03/11/2017   K 4.2 03/11/2017   CL 99 03/11/2017   CREATININE 0.80 03/11/2017   BUN 21 03/11/2017   CO2 27 03/11/2017   TSH 4.38 03/11/2017   PSA 0.26 07/13/2013   INR 1.0 06/26/2009    No results found.  Assessment & Plan:   [name] was seen today for follow-up.  Diagnoses and all orders for this visit:  Cellulitis of right lower extremity -     cefTRIAXone (ROCEPHIN) injection 500 mg; Inject 500 mg into the muscle once.  Bilateral leg edema -  Discontinue: furosemide (LASIX) 40 MG tablet; Take 2 tablets (80 mg total) by mouth daily. -     Basic metabolic panel; Future -     furosemide (LASIX) 40 MG tablet; Take 0.5 tablets (20 mg total) by mouth daily.  Other specified hypothyroidism -     levothyroxine (SYNTHROID, LEVOTHROID) 50 MCG tablet; Take 1 tablet (50 mcg total) by mouth daily. -     TSH; Future -     T4, free; Future  Hyperlipidemia with target LDL less than 130 -     atorvastatin (LIPITOR) 20 MG tablet; Take 1 tablet (20 mg total) by mouth daily.   I have discontinued [name] furosemide. I have also changed his levothyroxine, atorvastatin, and furosemide. Additionally, I am having him maintain his cyanocobalamin, dapsone, doxycycline, SUPPORT COMPRESSION SOCK MENS, metoprolol succinate, and ELIQUIS. We  administered cefTRIAXone.  Meds ordered this encounter  Medications  . DISCONTD: furosemide (LASIX) 40 MG tablet    Sig: Take 2 tablets (80 mg total) by mouth daily.    Dispense:  10 tablet    Refill:  0    Order Specific Question:   Supervising Provider    Answer:   Cassandria Anger [[id number]]  . levothyroxine (SYNTHROID, LEVOTHROID) 50 MCG tablet    Sig: Take 1 tablet (50 mcg total) by mouth daily.    Dispense:  30 tablet    Refill:  0    Order Specific Question:   Supervising Provider    Answer:   Cassandria Anger [[id number]]  . atorvastatin (LIPITOR) 20 MG tablet    Sig: Take 1 tablet (20 mg total) by mouth daily.    Dispense:  30 tablet    Refill:  0    Order Specific Question:   Supervising Provider    Answer:   Cassandria Anger [[id number]]  . cefTRIAXone (ROCEPHIN) injection 500 mg  . furosemide (LASIX) 40 MG tablet    Sig: Take 0.5 tablets (20 mg total) by mouth daily.    Dispense:  7 tablet    Refill:  0    Do not dispense 74m prescription. Replace with 284mprescription    Order Specific Question:   Supervising Provider    Answer:   PLCassandria [id number]]    Follow-up: Return if symptoms worsen or fail to improve.  ChWilfred LacyNP

## 2017-03-11 NOTE — Progress Notes (Signed)
Pre visit review using our clinic review tool, if applicable. No additional management support is needed unless otherwise documented below in the visit note. 

## 2017-03-11 NOTE — Patient Instructions (Signed)
Go to lab for blood draw.  Complete oral abx as prescribed.  Follow up with pcp as scheduled

## 2017-03-24 ENCOUNTER — Ambulatory Visit (INDEPENDENT_AMBULATORY_CARE_PROVIDER_SITE_OTHER): Payer: Medicare Other | Admitting: Internal Medicine

## 2017-03-24 ENCOUNTER — Encounter: Payer: Self-pay | Admitting: Internal Medicine

## 2017-03-24 VITALS — BP 120/72 | HR 80 | Temp 97.7°F | Resp 16 | Ht 73.5 in | Wt 171.0 lb

## 2017-03-24 DIAGNOSIS — I482 Chronic atrial fibrillation: Secondary | ICD-10-CM | POA: Diagnosis not present

## 2017-03-24 DIAGNOSIS — I340 Nonrheumatic mitral (valve) insufficiency: Secondary | ICD-10-CM

## 2017-03-24 DIAGNOSIS — R600 Localized edema: Secondary | ICD-10-CM | POA: Insufficient documentation

## 2017-03-24 DIAGNOSIS — I4821 Permanent atrial fibrillation: Secondary | ICD-10-CM

## 2017-03-24 MED ORDER — TORSEMIDE 20 MG PO TABS: 20.0000 mg | ORAL_TABLET | Freq: Every day | ORAL | 1 refills | Status: DC

## 2017-03-24 MED ORDER — METOLAZONE 2.5 MG PO TABS: 2.5000 mg | ORAL_TABLET | ORAL | 1 refills | Status: DC

## 2017-03-24 NOTE — Progress Notes (Signed)
Subjective:  Patient ID: [name], male    DOB: 03/21/1930  Age: 83 y.o. MRN: [id number]  CC: Leg Swelling   HPI [name] presents for concerns about LE edema - This has been developing over the last several weeks. He recently tried a course of Lasix but did not get much improvement. He denies chest pain, shortness of breath, palpitations, diaphoresis, or fatigue.  Outpatient Medications Prior to Visit  Medication Sig Dispense Refill  . atorvastatin (LIPITOR) 20 MG tablet Take 1 tablet (20 mg total) by mouth daily. 30 tablet 0  . cyanocobalamin 1000 MCG tablet Take 100 mcg by mouth daily.    . dapsone 100 MG tablet take 1/2 tablet by mouth once daily 45 tablet 1  . Elastic Bandages & Supports (SUPPORT COMPRESSION SOCK MENS) MISC 1 application by Does not apply route daily. Medium compression 1 each 0  . ELIQUIS 5 MG TABS tablet take 1 tablet by mouth twice a day 60 tablet 4  . levothyroxine (SYNTHROID, LEVOTHROID) 50 MCG tablet Take 1 tablet (50 mcg total) by mouth daily. 30 tablet 0  . metoprolol succinate (TOPROL-XL) 25 MG 24 hr tablet take 1 tablet by mouth once daily 30 tablet 11  . furosemide (LASIX) 40 MG tablet Take 0.5 tablets (20 mg total) by mouth daily. (Patient not taking: Reported on 03/24/2017) 7 tablet 0   No facility-administered medications prior to visit.     ROS Review of Systems  Constitutional: Negative for appetite change, chills, diaphoresis and fatigue.  HENT: Negative.  Negative for sinus pressure and trouble swallowing.   Eyes: Negative for visual disturbance.  Respiratory: Negative for cough, chest tightness, shortness of breath and wheezing.   Cardiovascular: Positive for leg swelling.  Gastrointestinal: Negative for abdominal pain, diarrhea and vomiting.  Endocrine: Negative.  Negative for cold intolerance, heat intolerance, polydipsia, polyphagia and polyuria.  Genitourinary: Negative.  Negative for difficulty urinating, flank pain,  frequency, hematuria and scrotal swelling.  Musculoskeletal: Negative.  Negative for back pain and neck pain.  Skin: Negative.   Allergic/Immunologic: Negative.   Neurological: Negative.  Negative for dizziness, weakness, light-headedness and numbness.  Hematological: Negative for adenopathy. Does not bruise/bleed easily.  Psychiatric/Behavioral: Negative.     Objective:  BP 120/72 (BP Location: Right Arm, Patient Position: Sitting, Cuff Size: Normal)   Pulse 80   Temp 97.7 F (36.5 C) (Oral)   Ht 6' 1.5" (1.867 m)   Wt 171 lb (77.6 kg)   SpO2 96%   BMI 22.25 kg/m   BP Readings from Last 3 Encounters:  03/24/17 120/72  03/11/17 122/68  03/08/17 128/80    Wt Readings from Last 3 Encounters:  03/24/17 171 lb (77.6 kg)  03/11/17 169 lb (76.7 kg)  03/08/17 175 lb (79.4 kg)    Physical Exam  Constitutional: He is oriented to person, place, and time. No distress.  HENT:  Mouth/Throat: Oropharynx is clear and moist. No oropharyngeal exudate.  Eyes: Conjunctivae are normal. Right eye exhibits no discharge. Left eye exhibits no discharge. No scleral icterus.  Neck: Normal range of motion. Neck supple. No JVD present. No tracheal deviation present. No thyromegaly present.  Cardiovascular: Normal rate, normal heart sounds and intact distal pulses.  An irregularly irregular rhythm present. Exam reveals no gallop and no friction rub.   No murmur heard. Pulmonary/Chest: Effort normal and breath sounds normal. No stridor. No respiratory distress. He has no wheezes. He has no rales. He exhibits no tenderness.  Abdominal: Soft.  Bowel sounds are normal. He exhibits no distension and no mass. There is no tenderness. There is no rebound and no guarding.  Musculoskeletal: Normal range of motion. He exhibits edema (3+ pitting edema in BLE). He exhibits no tenderness or deformity.  Lymphadenopathy:    He has no cervical adenopathy.  Neurological: He is oriented to person, place, and time.    Skin: Skin is warm and dry. No rash noted. He is not diaphoretic. No erythema. No pallor.  Vitals reviewed.   Lab Results  Component Value Date   WBC 4.2 01/20/2017   HGB 11.6 (L) 01/20/2017   HCT 36.1 (L) 01/20/2017   PLT 216 01/20/2017   GLUCOSE 97 03/11/2017   CHOL 113 01/20/2017   TRIG 80 01/20/2017   HDL 51 01/20/2017   LDLCALC 46 01/20/2017   ALT 43 01/20/2017   AST 55 (H) 01/20/2017   NA 133 (L) 03/11/2017   K 4.2 03/11/2017   CL 99 03/11/2017   CREATININE 0.80 03/11/2017   BUN 21 03/11/2017   CO2 27 03/11/2017   TSH 4.38 03/11/2017   PSA 0.26 07/13/2013   INR 1.0 06/26/2009    No results found.  Assessment & Plan:   [name] was seen today for leg swelling.  Diagnoses and all orders for this visit:  Localized edema- he did not have a good response to furosemide indicating he may not have absorbed it well so I will change to a more potent, once day loop diuretic and will add metolazone every other day as well. -     torsemide (DEMADEX) 20 MG tablet; Take 1 tablet (20 mg total) by mouth daily. -     metolazone (ZAROXOLYN) 2.5 MG tablet; Take 1 tablet (2.5 mg total) by mouth every other day.  Non-rheumatic mitral regurgitation  Permanent atrial fibrillation ([place])- he has good rate control but remains in chronic atrial fibrillation, will continue anticoagulation with the liquids.   I have discontinued [name] furosemide. I am also having him start on torsemide and metolazone. Additionally, I am having him maintain his cyanocobalamin, dapsone, SUPPORT COMPRESSION SOCK MENS, metoprolol succinate, ELIQUIS, levothyroxine, and atorvastatin.  Meds ordered this encounter  Medications  . torsemide (DEMADEX) 20 MG tablet    Sig: Take 1 tablet (20 mg total) by mouth daily.    Dispense:  30 tablet    Refill:  1  . metolazone (ZAROXOLYN) 2.5 MG tablet    Sig: Take 1 tablet (2.5 mg total) by mouth every other day.    Dispense:  15 tablet    Refill:  1      Follow-up: Return in about 6 weeks (around 05/05/2017).  [name], MD

## 2017-03-24 NOTE — Progress Notes (Signed)
Pre visit review using our clinic review tool, if applicable. No additional management support is needed unless otherwise documented below in the visit note. 

## 2017-03-24 NOTE — Patient Instructions (Signed)
Edema Edema is an abnormal buildup of fluids in your bodytissues. Edema is somewhatdependent on gravity to pull the fluid to the lowest place in your body. That makes the condition more common in the legs and thighs (lower extremities). Painless swelling of the feet and ankles is common and becomes more likely as you get older. It is also common in looser tissues, like around your eyes. When the affected area is squeezed, the fluid may move out of that spot and leave a dent for a few moments. This dent is called pitting. What are the causes? There are many possible causes of edema. Eating too much salt and being on your feet or sitting for a long time can cause edema in your legs and ankles. Hot weather may make edema worse. Common medical causes of edema include:  Heart failure.  Liver disease.  Kidney disease.  Weak blood vessels in your legs.  Cancer.  An injury.  Pregnancy.  Some medications.  Obesity.  What are the signs or symptoms? Edema is usually painless.Your skin may look swollen or shiny. How is this diagnosed? Your health care provider may be able to diagnose edema by asking about your medical history and doing a physical exam. You may need to have tests such as X-rays, an electrocardiogram, or blood tests to check for medical conditions that may cause edema. How is this treated? Edema treatment depends on the cause. If you have heart, liver, or kidney disease, you need the treatment appropriate for these conditions. General treatment may include:  Elevation of the affected body part above the level of your heart.  Compression of the affected body part. Pressure from elastic bandages or support stockings squeezes the tissues and forces fluid back into the blood vessels. This keeps fluid from entering the tissues.  Restriction of fluid and salt intake.  Use of a water pill (diuretic). These medications are appropriate only for some types of edema. They pull fluid  out of your body and make you urinate more often. This gets rid of fluid and reduces swelling, but diuretics can have side effects. Only use diuretics as directed by your health care provider.  Follow these instructions at home:  Keep the affected body part above the level of your heart when you are lying down.  Do not sit still or stand for prolonged periods.  Do not put anything directly under your knees when lying down.  Do not wear constricting clothing or garters on your upper legs.  Exercise your legs to work the fluid back into your blood vessels. This may help the swelling go down.  Wear elastic bandages or support stockings to reduce ankle swelling as directed by your health care provider.  Eat a low-salt diet to reduce fluid if your health care provider recommends it.  Only take medicines as directed by your health care provider. Contact a health care provider if:  Your edema is not responding to treatment.  You have heart, liver, or kidney disease and notice symptoms of edema.  You have edema in your legs that does not improve after elevating them.  You have sudden and unexplained weight gain. Get help right away if:  You develop shortness of breath or chest pain.  You cannot breathe when you lie down.  You develop pain, redness, or warmth in the swollen areas.  You have heart, liver, or kidney disease and suddenly get edema.  You have a fever and your symptoms suddenly get worse. This information is   not intended to replace advice given to you by your health care provider. Make sure you discuss any questions you have with your health care provider. Document Released: 12/06/2005 Document Revised: 05/13/2016 Document Reviewed: 09/28/2013 Elsevier Interactive Patient Education  2017 [employer]  

## 2017-04-06 NOTE — Progress Notes (Signed)
HPI: FU atrial fibrillation. Patient had electrocardiogram on 07/20/2013 that showed atrial fibrillation. We treated with rate control/anticoagulation. Echocardiogram repeated February 2017 and showed normal LV systolic function, restricted posterior mitral valve leaflet with moderate to severe mitral regurgitation, moderate biatrial enlargement and moderate tricuspid regurgitation. Declined FU echo at last ov. Lower ext dopplers 3/18 show no DVT. Patient recently placed on Zaroxolyn and Demadex for new onset pedal edema. His pedal edema has resolved. He had a syncopal episode in his kitchen that sounds to be orthostatic mediated. He has not taken diuretics in 3 days and his symptoms have improved. Since last seen, he now denies dyspnea, chest pain, palpitations and his pedal edema has resolved.  Current Outpatient Prescriptions  Medication Sig Dispense Refill  . atorvastatin (LIPITOR) 20 MG tablet Take 1 tablet (20 mg total) by mouth daily. 30 tablet 0  . cyanocobalamin 1000 MCG tablet Take 100 mcg by mouth daily.    . dapsone 100 MG tablet take 1/2 tablet by mouth once daily 45 tablet 1  . Elastic Bandages & Supports (SUPPORT COMPRESSION SOCK MENS) MISC 1 application by Does not apply route daily. Medium compression 1 each 0  . ELIQUIS 5 MG TABS tablet take 1 tablet by mouth twice a day 60 tablet 4  . furosemide (LASIX) 40 MG tablet Take 1 tablet by mouth daily.    Marland Kitchen levothyroxine (SYNTHROID, LEVOTHROID) 50 MCG tablet Take 1 tablet (50 mcg total) by mouth daily. 30 tablet 0  . metoprolol succinate (TOPROL-XL) 25 MG 24 hr tablet take 1 tablet by mouth once daily 30 tablet 11   No current facility-administered medications for this visit.      Past Medical History:  Diagnosis Date  . Alcohol abuse   . Allergic dermatitis   . Atrial fibrillation ([place])   . Celiac disease   . CVA (cerebral infarction)   . DJD (degenerative joint disease)   . Macrocytic anemia   . Nonspecific  (abnormal) findings on radiological and other examination of other intrathoracic organs   . Nonspecific abnormal results of thyroid function study   . Olecranon bursitis   . Prostate cancer ([place])   . Pure hypercholesterolemia   . Unspecified hemorrhoids without mention of complication     Past Surgical History:  Procedure Laterality Date  . prostate biopsy and see implantation    . WRIST FRACTURE SURGERY      Social History   Social History  . Marital status: Married    Spouse name: N/A  . Number of children: 1  . 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 Smoking status: Never Smoker  . Smokeless tobacco: Never Used  . Alcohol use Yes     Comment: 3-4 beers per day  . Drug use: Unknown  . Sexual activity: Not on file   Other Topics Concern  . Not on file   Social History Narrative  . No narrative on file    Family History  Problem Relation Age of Onset  . Heart disease      No family history    ROS: no fevers or chills, productive cough, hemoptysis, dysphasia, odynophagia, melena, hematochezia, dysuria, hematuria, rash, seizure activity, orthopnea, PND, claudication. Remaining systems are negative.  Physical Exam: Well-developed well-nourished in no acute distress.  Skin is warm and dry.  HEENT is normal.  Neck is supple. No bruits Chest is clear to  auscultation with normal expansion.  Cardiovascular exam is regular rate and rhythm. 1/6 systolic murmur apex Abdominal exam nontender or distended. No masses palpated. Extremities show trace edema. neuro grossly intact   A/P  1 Permanent atrial fibrillation-continue beta blocker for rate control; continue apixaban.   2 Mitral regurgitation-patient recently developed increasing bilateral lower extremity edema. This has resolved after being treated with Demadex and Zaroxolyn by primary care. He then had a syncopal episode that sounds orthostatic  mediated. We will discontinue his Demadex and Zaroxolyn. I have given him a prescription for Lasix 20 mg daily for increasing lower extremity edema or weight gain of 2-3 pounds. Check potassium and renal function. He is now agreeable to repeating his echocardiogram to assess LV function and mitral regurgitation. We will arrange. If mitral regurgitation is severe would need to consider mitral valve surgery. I will see him back in 4-6 weeks.  3 hyperlipidemia-continue statin.  4 recent syncopal episode-likely orthostatic mediated. His symptoms have resolved after discontinuing Demadex and Zaroxolyn. Echocardiogram to reassess LV function.  Kirk Ruths, MD

## 2017-04-12 ENCOUNTER — Encounter: Payer: Self-pay | Admitting: Cardiology

## 2017-04-12 ENCOUNTER — Ambulatory Visit (INDEPENDENT_AMBULATORY_CARE_PROVIDER_SITE_OTHER): Payer: Medicare Other | Admitting: Cardiology

## 2017-04-12 VITALS — BP 124/83 | HR 72 | Ht 73.0 in | Wt 160.6 lb

## 2017-04-12 DIAGNOSIS — I059 Rheumatic mitral valve disease, unspecified: Secondary | ICD-10-CM | POA: Diagnosis not present

## 2017-04-12 DIAGNOSIS — I4891 Unspecified atrial fibrillation: Secondary | ICD-10-CM | POA: Diagnosis not present

## 2017-04-12 LAB — BASIC METABOLIC PANEL
BUN: 20 mg/dL (ref 7–25)
CHLORIDE: 101 mmol/L (ref 98–110)
CO2: 25 mmol/L (ref 20–31)
Calcium: 9 mg/dL (ref 8.6–10.3)
Creat: 0.69 mg/dL — ABNORMAL LOW (ref 0.70–1.11)
GLUCOSE: 94 mg/dL (ref 65–99)
Potassium: 4.7 mmol/L (ref 3.5–5.3)
SODIUM: 134 mmol/L — AB (ref 135–146)

## 2017-04-12 MED ORDER — FUROSEMIDE 40 MG PO TABS: ORAL_TABLET | ORAL | 12 refills | Status: DC

## 2017-04-12 NOTE — Patient Instructions (Signed)
Medication Instructions:   STOP TORSEMIDE  STOP METOLAZONE  TAKE FUROSEMIDE 20 MG ONCE DAILY AS NEEDED FOR SWELLING= 1/2 OF THE 40 MG TABLET ONCE DAILY AS NEEDED   Labwork:  Your physician recommends that you HAVE LAB WORK TODAY  Testing/Procedures:  Your physician has requested that you have an echocardiogram. Echocardiography is a painless test that uses sound waves to create images of your heart. It provides your doctor with information about the size and shape of your heart and how well your heart's chambers and valves are working. This procedure takes approximately one hour. There are no restrictions for this procedure.    Follow-Up:  Your physician recommends that you schedule a follow-up appointment in: [place]   If you need a refill on your cardiac medications before your next appointment, please call your pharmacy.

## 2017-04-13 DIAGNOSIS — R310 Gross hematuria: Secondary | ICD-10-CM | POA: Diagnosis not present

## 2017-04-13 DIAGNOSIS — C61 Malignant neoplasm of prostate: Secondary | ICD-10-CM | POA: Diagnosis not present

## 2017-04-18 ENCOUNTER — Other Ambulatory Visit: Payer: Self-pay | Admitting: *Deleted

## 2017-04-18 DIAGNOSIS — E785 Hyperlipidemia, unspecified: Secondary | ICD-10-CM

## 2017-04-18 DIAGNOSIS — E038 Other specified hypothyroidism: Secondary | ICD-10-CM

## 2017-04-18 MED ORDER — ATORVASTATIN CALCIUM 20 MG PO TABS: 20.0000 mg | ORAL_TABLET | Freq: Every day | ORAL | 0 refills | Status: DC

## 2017-04-18 MED ORDER — LEVOTHYROXINE SODIUM 50 MCG PO TABS: 50.0000 ug | ORAL_TABLET | Freq: Every day | ORAL | 0 refills | Status: DC

## 2017-04-20 ENCOUNTER — Encounter: Payer: Self-pay | Admitting: *Deleted

## 2017-04-22 ENCOUNTER — Ambulatory Visit (HOSPITAL_COMMUNITY): Payer: Medicare Other | Attending: Cardiology

## 2017-04-22 ENCOUNTER — Other Ambulatory Visit: Payer: Self-pay

## 2017-04-22 DIAGNOSIS — I059 Rheumatic mitral valve disease, unspecified: Secondary | ICD-10-CM | POA: Diagnosis not present

## 2017-04-22 DIAGNOSIS — I272 Pulmonary hypertension, unspecified: Secondary | ICD-10-CM | POA: Insufficient documentation

## 2017-04-22 DIAGNOSIS — I081 Rheumatic disorders of both mitral and tricuspid valves: Secondary | ICD-10-CM | POA: Insufficient documentation

## 2017-05-02 NOTE — Progress Notes (Signed)
HPI: FU atrial fibrillation. Patient had electrocardiogram on 07/20/2013 that showed atrial fibrillation. We treated with rate control/anticoagulation. Echocardiogram repeated February 2017 and showed normal LV systolic function, restricted posterior mitral valve leaflet with moderate to severe mitral regurgitation, moderate biatrial enlargement and moderate tricuspid regurgitation. Lower ext dopplers 3/18 show no DVT. Patient developed pedal edema previously and was treated with diuretics. His edema resolved but he developed orthostasis followed by syncope. We changed his diuretic dosing at last office visit. Patient was agreeable to repeat echocardiogram at last office visit. This was performed 04/22/2017 and showed ejection fraction 45-50%, mildly dilated aortic root, moderate mitral regurgitation, moderate left atrial enlargement, severe right atrial enlargement, severe tricuspid regurgitation and severely elevated pulmonary pressure. I did review the echocardiogram and feel the ejection fraction is normal. Since last seen, he has dyspnea with more vigorous activities but not routine activities. No orthopnea, PND, pedal edema, chest pain, palpitations or syncope.  Current Outpatient Prescriptions  Medication Sig Dispense Refill  . atorvastatin (LIPITOR) 20 MG tablet Take 1 tablet (20 mg total) by mouth daily. 30 tablet 0  . cyanocobalamin 1000 MCG tablet Take 100 mcg by mouth daily.    . dapsone 100 MG tablet take 1/2 tablet by mouth once daily 45 tablet 1  . ELIQUIS 5 MG TABS tablet take 1 tablet by mouth twice a day 60 tablet 4  . levothyroxine (SYNTHROID, LEVOTHROID) 50 MCG tablet Take 1 tablet (50 mcg total) by mouth daily. 30 tablet 0  . metoprolol succinate (TOPROL-XL) 25 MG 24 hr tablet take 1 tablet by mouth once daily 30 tablet 11   No current facility-administered medications for this visit.      Past Medical History:  Diagnosis Date  . Alcohol abuse   . Allergic dermatitis    . Atrial fibrillation ([place])   . Celiac disease   . CVA (cerebral infarction)   . DJD (degenerative joint disease)   . Macrocytic anemia   . Nonspecific (abnormal) findings on radiological and other examination of other intrathoracic organs   . Nonspecific abnormal results of thyroid function study   . Olecranon bursitis   . Prostate cancer ([place])   . Pure hypercholesterolemia   . Unspecified hemorrhoids without mention of complication     Past Surgical History:  Procedure Laterality Date  . prostate biopsy and see implantation    . WRIST FRACTURE SURGERY      Social History   Social History  . Marital status: Married    Spouse name: N/A  . Number of children: 1  . 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 Smoking status: Never Smoker  . Smokeless tobacco: Never Used  . Alcohol use Yes     Comment: 3-4 beers per day  . Drug use: Unknown  . Sexual activity: Not on file   Other Topics Concern  . Not on file   Social History Narrative  . No narrative on file    Family History  Problem Relation Age of Onset  . Heart disease Unknown        No family history    ROS: no fevers or chills, productive cough, hemoptysis, dysphasia, odynophagia, melena, hematochezia, dysuria, hematuria, rash, seizure activity, orthopnea, PND, pedal edema, claudication. Remaining systems are negative.  Physical Exam: Well-developed well-nourished in no acute distress.  Skin is warm and dry.  HEENT is normal.  Neck is  supple.  Chest is clear to auscultation with normal expansion.  Cardiovascular exam is regular rate and rhythm. 2/6 systolic murmur apex. Abdominal exam nontender or distended. No masses palpated. Extremities show no edema. neuro grossly intact   A/P  1 Permanent atrial fibrillation-continue beta blocker for rate control. Continue apixaban.  2 mitral regurgitation-I have personally reviewed the patient's  echocardiogram. I believe the LV function is normal and LV not dilated. Mitral regurgitation is moderate. He also has severe tricuspid regurgitation and severely elevated pulmonary pressure. He has mild dyspnea on exertion but is otherwise asymptomatic. He is nearing 83 years old. I would prefer to be conservative and he is in agreement. He will take Lasix 20 mg daily as needed for increasing dyspnea or edema. I will see him back in 4 months to make sure that his symptoms are stable.  3 hyperlipidemia-continue statin.  Kirk Ruths, MD

## 2017-05-05 DIAGNOSIS — Z961 Presence of intraocular lens: Secondary | ICD-10-CM | POA: Diagnosis not present

## 2017-05-09 ENCOUNTER — Ambulatory Visit (INDEPENDENT_AMBULATORY_CARE_PROVIDER_SITE_OTHER): Payer: Medicare Other | Admitting: Cardiology

## 2017-05-09 ENCOUNTER — Encounter: Payer: Self-pay | Admitting: Cardiology

## 2017-05-09 VITALS — BP 98/58 | HR 70 | Ht 73.0 in | Wt 159.6 lb

## 2017-05-09 DIAGNOSIS — I340 Nonrheumatic mitral (valve) insufficiency: Secondary | ICD-10-CM | POA: Diagnosis not present

## 2017-05-09 DIAGNOSIS — I482 Chronic atrial fibrillation: Secondary | ICD-10-CM

## 2017-05-09 DIAGNOSIS — Z85828 Personal history of other malignant neoplasm of skin: Secondary | ICD-10-CM | POA: Diagnosis not present

## 2017-05-09 DIAGNOSIS — C4442 Squamous cell carcinoma of skin of scalp and neck: Secondary | ICD-10-CM | POA: Diagnosis not present

## 2017-05-09 DIAGNOSIS — L570 Actinic keratosis: Secondary | ICD-10-CM | POA: Diagnosis not present

## 2017-05-09 DIAGNOSIS — E7800 Pure hypercholesterolemia, unspecified: Secondary | ICD-10-CM

## 2017-05-09 DIAGNOSIS — I4821 Permanent atrial fibrillation: Secondary | ICD-10-CM

## 2017-05-09 NOTE — Patient Instructions (Signed)
Your physician recommends that you schedule a follow-up appointment in: [place]  If you need a refill on your cardiac medications before your next appointment, please call your pharmacy.

## 2017-05-10 DIAGNOSIS — R310 Gross hematuria: Secondary | ICD-10-CM | POA: Diagnosis not present

## 2017-05-17 ENCOUNTER — Other Ambulatory Visit: Payer: Self-pay | Admitting: Internal Medicine

## 2017-05-17 DIAGNOSIS — E785 Hyperlipidemia, unspecified: Secondary | ICD-10-CM

## 2017-05-17 DIAGNOSIS — E038 Other specified hypothyroidism: Secondary | ICD-10-CM

## 2017-05-17 DIAGNOSIS — K900 Celiac disease: Secondary | ICD-10-CM

## 2017-05-19 DIAGNOSIS — R310 Gross hematuria: Secondary | ICD-10-CM | POA: Diagnosis not present

## 2017-05-19 DIAGNOSIS — N35011 Post-traumatic bulbous urethral stricture: Secondary | ICD-10-CM | POA: Diagnosis not present

## 2017-05-26 DIAGNOSIS — N200 Calculus of kidney: Secondary | ICD-10-CM | POA: Diagnosis not present

## 2017-05-26 DIAGNOSIS — R310 Gross hematuria: Secondary | ICD-10-CM | POA: Diagnosis not present

## 2017-05-30 DIAGNOSIS — N35011 Post-traumatic bulbous urethral stricture: Secondary | ICD-10-CM | POA: Diagnosis not present

## 2017-05-30 DIAGNOSIS — Z85828 Personal history of other malignant neoplasm of skin: Secondary | ICD-10-CM | POA: Diagnosis not present

## 2017-05-30 DIAGNOSIS — C4442 Squamous cell carcinoma of skin of scalp and neck: Secondary | ICD-10-CM | POA: Diagnosis not present

## 2017-05-30 DIAGNOSIS — R310 Gross hematuria: Secondary | ICD-10-CM | POA: Diagnosis not present

## 2017-06-09 DIAGNOSIS — C4442 Squamous cell carcinoma of skin of scalp and neck: Secondary | ICD-10-CM | POA: Diagnosis not present

## 2017-06-09 DIAGNOSIS — Z85828 Personal history of other malignant neoplasm of skin: Secondary | ICD-10-CM | POA: Diagnosis not present

## 2017-06-21 DIAGNOSIS — L570 Actinic keratosis: Secondary | ICD-10-CM | POA: Diagnosis not present

## 2017-07-11 DIAGNOSIS — Z85828 Personal history of other malignant neoplasm of skin: Secondary | ICD-10-CM | POA: Diagnosis not present

## 2017-07-11 DIAGNOSIS — C44319 Basal cell carcinoma of skin of other parts of face: Secondary | ICD-10-CM | POA: Diagnosis not present

## 2017-07-11 DIAGNOSIS — L570 Actinic keratosis: Secondary | ICD-10-CM | POA: Diagnosis not present

## 2017-08-09 DIAGNOSIS — Z85828 Personal history of other malignant neoplasm of skin: Secondary | ICD-10-CM | POA: Diagnosis not present

## 2017-08-09 DIAGNOSIS — C44319 Basal cell carcinoma of skin of other parts of face: Secondary | ICD-10-CM | POA: Diagnosis not present

## 2017-08-19 DIAGNOSIS — C61 Malignant neoplasm of prostate: Secondary | ICD-10-CM | POA: Diagnosis not present

## 2017-08-19 DIAGNOSIS — N35011 Post-traumatic bulbous urethral stricture: Secondary | ICD-10-CM | POA: Diagnosis not present

## 2017-08-21 ENCOUNTER — Other Ambulatory Visit: Payer: Self-pay | Admitting: Cardiology

## 2017-08-31 NOTE — Progress Notes (Signed)
HPI: FU atrial fibrillation. Patient had electrocardiogram on 07/20/2013 that showed atrial fibrillation. We treated with rate control/anticoagulation. Echocardiogram repeated February 2017 and showed normal LV systolic function, restricted posterior mitral valve leaflet with moderate to severe mitral regurgitation, moderate biatrial enlargement and moderate tricuspid regurgitation. Lower ext dopplers 3/18 show no DVT. Patient developed pedal edema previously and was treated with diuretics. His edema resolved but he developed orthostasis followed by syncope. We changed his diuretic dosing at last office visit. Patient was agreeable to repeat echocardiogram at last office visit. This was performed 04/22/2017 and showed ejection fraction 45-50%, mildly dilated aortic root, moderate mitral regurgitation, moderate left atrial enlargement, severe right atrial enlargement, severe tricuspid regurgitation and severely elevated pulmonary pressure. I did review the echocardiogram and feel the ejection fraction is normal. Since last seen, the patient has dyspnea with more extreme activities but not with routine activities. It is relieved with rest. It is not associated with chest pain. There is no orthopnea, PND or pedal edema. There is no syncope or palpitations. There is no exertional chest pain.   Current Outpatient Prescriptions  Medication Sig Dispense Refill  . atorvastatin (LIPITOR) 20 MG tablet take 1 tablet by mouth once daily 90 tablet 1  . cyanocobalamin 1000 MCG tablet Take 100 mcg by mouth daily.    . dapsone 100 MG tablet take 1/2 tablet by mouth once daily 45 tablet 1  . ELIQUIS 5 MG TABS tablet TAKE 1 TABLET BY MOUTH TWICE A DAY 180 tablet 1  . levothyroxine (SYNTHROID, LEVOTHROID) 50 MCG tablet take 1 tablet by mouth once daily 90 tablet 1  . metoprolol succinate (TOPROL-XL) 25 MG 24 hr tablet take 1 tablet by mouth once daily 30 tablet 11   No current facility-administered medications  for this visit.      Past Medical History:  Diagnosis Date  . Alcohol abuse   . Allergic dermatitis   . Atrial fibrillation ([place])   . Celiac disease   . CVA (cerebral infarction)   . DJD (degenerative joint disease)   . Macrocytic anemia   . Nonspecific (abnormal) findings on radiological and other examination of other intrathoracic organs   . Nonspecific abnormal results of thyroid function study   . Olecranon bursitis   . Prostate cancer ([place])   . Pure hypercholesterolemia   . Unspecified hemorrhoids without mention of complication     Past Surgical History:  Procedure Laterality Date  . prostate biopsy and see implantation    . WRIST FRACTURE SURGERY      Social History   Social History  . Marital status: Married    Spouse name: N/A  . Number of children: 1  . 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 Smoking status: Never Smoker  . Smokeless tobacco: Never Used  . Alcohol use Yes     Comment: 3-4 beers per day  . Drug use: Unknown  . Sexual activity: Not on file   Other Topics Concern  . Not on file   Social History Narrative  . No narrative on file    Family History  Problem Relation Age of Onset  . Heart disease Unknown        No family history    ROS: no fevers or chills, productive cough, hemoptysis, dysphasia, odynophagia, melena, hematochezia, dysuria, hematuria, rash, seizure activity, orthopnea, PND, pedal edema, claudication. Remaining systems are negative.  Physical Exam: Well-developed well-nourished in no acute distress.  Skin is warm and dry.  HEENT is normal.  Neck is supple.  Chest is clear to auscultation with normal expansion.  Cardiovascular exam is irregular, 3/6 systolic murmur apex Abdominal exam nontender or distended. No masses palpated. Extremities show no edema. neuro grossly intact   A/P  1 Permanent atrial fibrillation-patient is doing well from a  symptomatic standpoint. Continue beta blocker for rate control. Continue apixaban.  Check hemoglobin and renal function.  2 mitral regurgitation-as outlined in previous notes I have reviewed his prior echocardiogram and his LV function appeared to be normal. Mitral regurgitation was moderate. He does have severe tricuspid regurgitation and severely elevated pulmonary pressure. Given his and we are trying to be conservative. He will continue Lasix as needed. We will plan to repeat echocardiogram when he returns in 6 months.  3 hyperlipidemia-continue statin.   Kirk Ruths, MD

## 2017-09-12 ENCOUNTER — Encounter: Payer: Self-pay | Admitting: Cardiology

## 2017-09-12 ENCOUNTER — Ambulatory Visit (INDEPENDENT_AMBULATORY_CARE_PROVIDER_SITE_OTHER): Payer: Medicare Other | Admitting: Cardiology

## 2017-09-12 VITALS — BP 126/64 | HR 65 | Ht 73.0 in | Wt 162.0 lb

## 2017-09-12 DIAGNOSIS — I482 Chronic atrial fibrillation: Secondary | ICD-10-CM | POA: Diagnosis not present

## 2017-09-12 DIAGNOSIS — E7800 Pure hypercholesterolemia, unspecified: Secondary | ICD-10-CM

## 2017-09-12 DIAGNOSIS — I059 Rheumatic mitral valve disease, unspecified: Secondary | ICD-10-CM | POA: Diagnosis not present

## 2017-09-12 DIAGNOSIS — I4821 Permanent atrial fibrillation: Secondary | ICD-10-CM

## 2017-09-12 LAB — BASIC METABOLIC PANEL
BUN/Creatinine Ratio: 20 (ref 10–24)
BUN: 15 mg/dL (ref 8–27)
CALCIUM: 9.3 mg/dL (ref 8.6–10.2)
CHLORIDE: 98 mmol/L (ref 96–106)
CO2: 23 mmol/L (ref 20–29)
CREATININE: 0.74 mg/dL — AB (ref 0.76–1.27)
GFR calc Af Amer: 97 mL/min/{1.73_m2} (ref >59)
GFR calc non Af Amer: 84 mL/min/{1.73_m2} (ref >59)
GLUCOSE: 101 mg/dL — AB (ref 65–99)
Potassium: 5.1 mmol/L (ref 3.5–5.2)
Sodium: 133 mmol/L — ABNORMAL LOW (ref 134–144)

## 2017-09-12 LAB — CBC
HEMOGLOBIN: 11.4 g/dL — AB (ref 13.0–17.7)
Hematocrit: 34.5 % — ABNORMAL LOW (ref 37.5–51.0)
MCH: 34.3 pg — AB (ref 26.6–33.0)
MCHC: 33 g/dL (ref 31.5–35.7)
MCV: 104 fL — ABNORMAL HIGH (ref 79–97)
PLATELETS: 195 10*3/uL (ref 150–379)
RBC: 3.32 x10E6/uL — AB (ref 4.14–5.80)
RDW: 13.8 % (ref 12.3–15.4)
WBC: 4.1 10*3/uL (ref 3.4–10.8)

## 2017-09-12 NOTE — Patient Instructions (Signed)

## 2017-10-21 DIAGNOSIS — Z23 Encounter for immunization: Secondary | ICD-10-CM | POA: Diagnosis not present

## 2017-11-11 ENCOUNTER — Other Ambulatory Visit: Payer: Self-pay | Admitting: Internal Medicine

## 2017-11-11 DIAGNOSIS — K900 Celiac disease: Secondary | ICD-10-CM

## 2017-11-15 DIAGNOSIS — L570 Actinic keratosis: Secondary | ICD-10-CM | POA: Diagnosis not present

## 2017-11-15 DIAGNOSIS — Z85828 Personal history of other malignant neoplasm of skin: Secondary | ICD-10-CM | POA: Diagnosis not present

## 2017-11-22 ENCOUNTER — Other Ambulatory Visit: Payer: Self-pay | Admitting: Internal Medicine

## 2017-11-22 DIAGNOSIS — E038 Other specified hypothyroidism: Secondary | ICD-10-CM

## 2017-12-10 ENCOUNTER — Other Ambulatory Visit: Payer: Self-pay | Admitting: Internal Medicine

## 2017-12-10 DIAGNOSIS — E785 Hyperlipidemia, unspecified: Secondary | ICD-10-CM

## 2018-02-02 ENCOUNTER — Other Ambulatory Visit: Payer: Self-pay

## 2018-02-02 MED ORDER — METOPROLOL SUCCINATE ER 25 MG PO TB24: 25.0000 mg | ORAL_TABLET | Freq: Every day | ORAL | 5 refills | Status: DC

## 2018-02-02 NOTE — Telephone Encounter (Signed)
REFILL 

## 2018-02-14 ENCOUNTER — Telehealth: Payer: Self-pay

## 2018-02-14 DIAGNOSIS — E038 Other specified hypothyroidism: Secondary | ICD-10-CM

## 2018-02-14 NOTE — Telephone Encounter (Signed)
Fax request to fill the levothyroxine and dapsone.   Pt needs to schedule an appt before any refills can be sent in.   Please contact pt for an appt.

## 2018-02-15 MED ORDER — LEVOTHYROXINE SODIUM 50 MCG PO TABS: 50.0000 ug | ORAL_TABLET | Freq: Every day | ORAL | 0 refills | Status: DC

## 2018-02-15 NOTE — Telephone Encounter (Signed)
Patient schedule year follow up in April.

## 2018-02-15 NOTE — Telephone Encounter (Signed)
erx has been sent.

## 2018-02-18 ENCOUNTER — Other Ambulatory Visit: Payer: Self-pay | Admitting: Cardiology

## 2018-02-23 ENCOUNTER — Other Ambulatory Visit: Payer: Self-pay | Admitting: Internal Medicine

## 2018-02-23 DIAGNOSIS — E785 Hyperlipidemia, unspecified: Secondary | ICD-10-CM

## 2018-02-23 DIAGNOSIS — K900 Celiac disease: Secondary | ICD-10-CM

## 2018-02-23 DIAGNOSIS — E038 Other specified hypothyroidism: Secondary | ICD-10-CM

## 2018-03-27 ENCOUNTER — Other Ambulatory Visit (INDEPENDENT_AMBULATORY_CARE_PROVIDER_SITE_OTHER): Payer: Medicare Other

## 2018-03-27 ENCOUNTER — Ambulatory Visit (INDEPENDENT_AMBULATORY_CARE_PROVIDER_SITE_OTHER): Payer: Medicare Other | Admitting: Internal Medicine

## 2018-03-27 ENCOUNTER — Encounter: Payer: Self-pay | Admitting: Internal Medicine

## 2018-03-27 VITALS — BP 120/70 | HR 65 | Temp 97.7°F | Resp 16 | Ht 73.0 in | Wt 173.2 lb

## 2018-03-27 DIAGNOSIS — I480 Paroxysmal atrial fibrillation: Secondary | ICD-10-CM

## 2018-03-27 DIAGNOSIS — E039 Hypothyroidism, unspecified: Secondary | ICD-10-CM | POA: Diagnosis not present

## 2018-03-27 DIAGNOSIS — D539 Nutritional anemia, unspecified: Secondary | ICD-10-CM

## 2018-03-27 LAB — COMPREHENSIVE METABOLIC PANEL
ALT: 22 U/L (ref 0–53)
AST: 31 U/L (ref 0–37)
Albumin: 4.2 g/dL (ref 3.5–5.2)
Alkaline Phosphatase: 78 U/L (ref 39–117)
BUN: 22 mg/dL (ref 6–23)
CHLORIDE: 101 meq/L (ref 96–112)
CO2: 25 meq/L (ref 19–32)
CREATININE: 0.8 mg/dL (ref 0.40–1.50)
Calcium: 9.5 mg/dL (ref 8.4–10.5)
GFR: 97.51 mL/min (ref >60.00)
Glucose, Bld: 108 mg/dL — ABNORMAL HIGH (ref 70–99)
Potassium: 4.5 mEq/L (ref 3.5–5.1)
SODIUM: 134 meq/L — AB (ref 135–145)
Total Bilirubin: 1.1 mg/dL (ref 0.2–1.2)
Total Protein: 8.4 g/dL — ABNORMAL HIGH (ref 6.0–8.3)

## 2018-03-27 LAB — CBC WITH DIFFERENTIAL/PLATELET
Basophils Absolute: 0 10*3/uL (ref 0.0–0.1)
Basophils Relative: 0.3 % (ref 0.0–3.0)
Eosinophils Absolute: 0.1 10*3/uL (ref 0.0–0.7)
Eosinophils Relative: 1 % (ref 0.0–5.0)
HCT: 33.8 % — ABNORMAL LOW (ref 39.0–52.0)
Hemoglobin: 11.5 g/dL — ABNORMAL LOW (ref 13.0–17.0)
Lymphocytes Relative: 37.9 % (ref 12.0–46.0)
Lymphs Abs: 2.1 10*3/uL (ref 0.7–4.0)
MCHC: 33.9 g/dL (ref 30.0–36.0)
MCV: 104 fl — ABNORMAL HIGH (ref 78.0–100.0)
Monocytes Absolute: 0.7 10*3/uL (ref 0.1–1.0)
Monocytes Relative: 12.7 % — ABNORMAL HIGH (ref 3.0–12.0)
Neutro Abs: 2.7 10*3/uL (ref 1.4–7.7)
Neutrophils Relative %: 48.1 % (ref 43.0–77.0)
Platelets: 199 10*3/uL (ref 150.0–400.0)
RBC: 3.25 Mil/uL — ABNORMAL LOW (ref 4.22–5.81)
RDW: 13.3 % (ref 11.5–15.5)
WBC: 5.6 10*3/uL (ref 4.0–10.5)

## 2018-03-27 LAB — TSH: TSH: 2.9 u[IU]/mL (ref 0.35–4.50)

## 2018-03-27 NOTE — Patient Instructions (Signed)
Hypothyroidism Hypothyroidism is a disorder of the thyroid. The thyroid is a large gland that is located in the lower front of the neck. The thyroid releases hormones that control how the body works. With hypothyroidism, the thyroid does not make enough of these hormones. What are the causes? Causes of hypothyroidism may include:  Viral infections.  Pregnancy.  Your own defense system (immune system) attacking your thyroid.  Certain medicines.  Birth defects.  Past radiation treatments to your head or neck.  Past treatment with radioactive iodine.  Past surgical removal of part or all of your thyroid.  Problems with the gland that is located in the center of your brain (pituitary).  What are the signs or symptoms? Signs and symptoms of hypothyroidism may include:  Feeling as though you have no energy (lethargy).  Inability to tolerate cold.  Weight gain that is not explained by a change in diet or exercise habits.  Dry skin.  Coarse hair.  Menstrual irregularity.  Slowing of thought processes.  Constipation.  Sadness or depression.  How is this diagnosed? Your health care provider may diagnose hypothyroidism with blood tests and ultrasound tests. How is this treated? Hypothyroidism is treated with medicine that replaces the hormones that your body does not make. After you begin treatment, it may take several weeks for symptoms to go away. Follow these instructions at home:  Take medicines only as directed by your health care provider.  If you start taking any new medicines, tell your health care provider.  Keep all follow-up visits as directed by your health care provider. This is important. As your condition improves, your dosage needs may change. You will need to have blood tests regularly so that your health care provider can watch your condition. Contact a health care provider if:  Your symptoms do not get better with treatment.  You are taking thyroid  replacement medicine and: ? You sweat excessively. ? You have tremors. ? You feel anxious. ? You lose weight rapidly. ? You cannot tolerate heat. ? You have emotional swings. ? You have diarrhea. ? You feel weak. Get help right away if:  You develop chest pain.  You develop an irregular heartbeat.  You develop a rapid heartbeat. This information is not intended to replace advice given to you by your health care provider. Make sure you discuss any questions you have with your health care provider. Document Released: 12/06/2005 Document Revised: 05/13/2016 Document Reviewed: 04/23/2014 Elsevier Interactive Patient Education  2018 Elsevier Inc.  

## 2018-03-27 NOTE — Progress Notes (Signed)
Subjective:  Patient ID: [name], male    DOB: Jan 31, 1930  Age: 84 y.o. MRN: [id number]  CC: Anemia; Hypothyroidism; and Atrial Fibrillation   HPI [name] presents for f/up - He is in his usual state of health.  He continues to be active and denies palpitations, DOE, chest pain, edema, or fatigue.  He offers no complaints today.  Outpatient Medications Prior to Visit  Medication Sig Dispense Refill  . atorvastatin (LIPITOR) 20 MG tablet TAKE 1 TABLET BY MOUTH EVERY DAY 90 tablet 0  . cyanocobalamin 1000 MCG tablet Take 100 mcg by mouth daily.    . dapsone 100 MG tablet TAKE 1/2 TABLET BY MOUTH ONCE DAILY 45 tablet 0  . ELIQUIS 5 MG TABS tablet TAKE 1 TABLET BY MOUTH TWICE A DAY 180 tablet 1  . levothyroxine (SYNTHROID, LEVOTHROID) 50 MCG tablet Take 1 tablet (50 mcg total) by mouth daily. 90 tablet 0  . metoprolol succinate (TOPROL-XL) 25 MG 24 hr tablet Take 1 tablet (25 mg total) by mouth daily. 30 tablet 5  . levothyroxine (SYNTHROID, LEVOTHROID) 50 MCG tablet TAKE 1 TABLET BY MOUTH EVERY DAY 90 tablet 0   No facility-administered medications prior to visit.     ROS Review of Systems  Constitutional: Negative.  Negative for appetite change, diaphoresis, fatigue and unexpected weight change.  HENT: Negative.   Eyes: Negative for visual disturbance.  Respiratory: Negative for cough, chest tightness, shortness of breath and wheezing.   Cardiovascular: Negative for chest pain, palpitations and leg swelling.  Gastrointestinal: Negative for abdominal pain, constipation, diarrhea, nausea and vomiting.  Endocrine: Negative.   Genitourinary: Negative.  Negative for difficulty urinating.  Musculoskeletal: Negative.  Negative for back pain and myalgias.  Skin: Negative.   Allergic/Immunologic: Negative.   Neurological: Negative.  Negative for dizziness, weakness and light-headedness.  Hematological: Negative for adenopathy. Does not bruise/bleed easily.    Psychiatric/Behavioral: Negative.     Objective:  BP 120/70 (BP Location: Left Arm, Patient Position: Sitting, Cuff Size: Normal)   Pulse 65   Temp 97.7 F (36.5 C) (Oral)   Resp 16   Ht 6' 1"  (1.854 m)   Wt 173 lb 4 oz (78.6 kg)   SpO2 95%   BMI 22.86 kg/m   BP Readings from Last 3 Encounters:  03/27/18 120/70  09/12/17 126/64  05/09/17 (!) 98/58    Wt Readings from Last 3 Encounters:  03/27/18 173 lb 4 oz (78.6 kg)  09/12/17 162 lb (73.5 kg)  05/09/17 159 lb 9.6 oz (72.4 kg)    Physical Exam  Constitutional: He is oriented to person, place, and time. No distress.  HENT:  Mouth/Throat: No oropharyngeal exudate.  Neck: Neck supple. No JVD present. No thyromegaly present.  Cardiovascular: Normal rate, regular rhythm and normal heart sounds. Exam reveals no gallop and no friction rub.  No murmur heard. Pulmonary/Chest: Effort normal and breath sounds normal. No stridor. No respiratory distress. He has no wheezes. He has no rales.  Abdominal: Soft. Bowel sounds are normal. He exhibits no mass. There is no tenderness. There is no guarding. No hernia.  Musculoskeletal: Normal range of motion. He exhibits no edema, tenderness or deformity.  Lymphadenopathy:    He has no cervical adenopathy.  Neurological: He is alert and oriented to person, place, and time.  Skin: Skin is warm and dry. No rash noted. He is not diaphoretic. No erythema.  Vitals reviewed.   Lab Results  Component Value Date   WBC  5.6 03/27/2018   HGB 11.5 (L) 03/27/2018   HCT 33.8 (L) 03/27/2018   PLT 199.0 03/27/2018   GLUCOSE 108 (H) 03/27/2018   CHOL 113 01/20/2017   TRIG 80 01/20/2017   HDL 51 01/20/2017   LDLCALC 46 01/20/2017   ALT 22 03/27/2018   AST 31 03/27/2018   NA 134 (L) 03/27/2018   K 4.5 03/27/2018   CL 101 03/27/2018   CREATININE 0.80 03/27/2018   BUN 22 03/27/2018   CO2 25 03/27/2018   TSH 2.90 03/27/2018   PSA 0.26 07/13/2013   INR 1.0 06/26/2009    No results  found.  Assessment & Plan:   [name] was seen today for anemia, hypothyroidism and atrial fibrillation.  Diagnoses and all orders for this visit:  Macrocytic anemia- His H&H are stable.  Prior workup was unremarkable.  This is most consistent with his medications including dapsone. -     Comprehensive metabolic panel; Future -     CBC with Differential/Platelet; Future  Acquired hypothyroidism- His TSH is in the normal range.  He will remain on the current dose of levothyroxine. -     TSH; Future  Paroxysmal atrial fibrillation ([place])- He has good rate and rhythm control.  Will continue Eliquis for stroke prevention.   I am having [name] maintain his cyanocobalamin, metoprolol succinate, levothyroxine, ELIQUIS, atorvastatin, and dapsone.  No orders of the defined types were placed in this encounter.    Follow-up: Return in about 6 months (around 09/26/2018).  [name], MD

## 2018-04-07 ENCOUNTER — Other Ambulatory Visit: Payer: Self-pay | Admitting: Cardiology

## 2018-05-17 ENCOUNTER — Other Ambulatory Visit: Payer: Self-pay | Admitting: Internal Medicine

## 2018-05-17 DIAGNOSIS — K900 Celiac disease: Secondary | ICD-10-CM

## 2018-05-17 DIAGNOSIS — E038 Other specified hypothyroidism: Secondary | ICD-10-CM

## 2018-05-17 DIAGNOSIS — E785 Hyperlipidemia, unspecified: Secondary | ICD-10-CM

## 2018-06-14 ENCOUNTER — Ambulatory Visit (INDEPENDENT_AMBULATORY_CARE_PROVIDER_SITE_OTHER): Payer: Medicare Other | Admitting: Internal Medicine

## 2018-06-14 ENCOUNTER — Encounter: Payer: Self-pay | Admitting: Internal Medicine

## 2018-06-14 VITALS — BP 120/70 | HR 74 | Temp 97.9°F | Resp 16 | Ht 73.0 in | Wt 162.5 lb

## 2018-06-14 DIAGNOSIS — D539 Nutritional anemia, unspecified: Secondary | ICD-10-CM

## 2018-06-14 DIAGNOSIS — M5416 Radiculopathy, lumbar region: Secondary | ICD-10-CM

## 2018-06-14 DIAGNOSIS — G8929 Other chronic pain: Secondary | ICD-10-CM

## 2018-06-14 DIAGNOSIS — M25552 Pain in left hip: Secondary | ICD-10-CM | POA: Diagnosis not present

## 2018-06-14 DIAGNOSIS — M25551 Pain in right hip: Secondary | ICD-10-CM

## 2018-06-14 DIAGNOSIS — E039 Hypothyroidism, unspecified: Secondary | ICD-10-CM | POA: Diagnosis not present

## 2018-06-14 NOTE — Progress Notes (Signed)
Subjective:  Patient ID: [name], male    DOB: 07/17/1930  Age: 84 y.o. MRN: [id number]  CC: Back Pain   HPI [name] presents for a 41-monthhistory of nontraumatic low back pain.  He describes a severe soreness that occasionally radiates into his left thigh.  He also complains of weakness, numbness, tingling in his lower extremities.  He tells me he has been seeing a chiropractor and had plain films done.  He does not know the results of the plain films and he says what the chiropractor was doing was not only not helping but it was making him worse.  He is taking over-the-counter remedies for symptom relief.  He does not want me to prescribe a medication to control the pain.  He specifically asked that he have an MRI done of his back and hips.  Outpatient Medications Prior to Visit  Medication Sig Dispense Refill  . atorvastatin (LIPITOR) 20 MG tablet TAKE 1 TABLET BY MOUTH EVERY DAY 90 tablet 1  . cyanocobalamin 1000 MCG tablet Take 100 mcg by mouth daily.    . dapsone 100 MG tablet TAKE 1/2 TABLET BY MOUTH ONCE DAILY 45 tablet 1  . ELIQUIS 5 MG TABS tablet TAKE 1 TABLET BY MOUTH TWICE A DAY 180 tablet 1  . levothyroxine (SYNTHROID, LEVOTHROID) 50 MCG tablet Take 1 tablet (50 mcg total) by mouth daily. 90 tablet 0  . metoprolol succinate (TOPROL-XL) 25 MG 24 hr tablet TAKE 1 TABLET BY MOUTH EVERY DAY 30 tablet 6  . levothyroxine (SYNTHROID, LEVOTHROID) 50 MCG tablet TAKE 1 TABLET BY MOUTH EVERY DAY 90 tablet 1   No facility-administered medications prior to visit.     ROS Review of Systems  Constitutional: Negative for chills, diaphoresis, fatigue and fever.  HENT: Negative.   Eyes: Negative for visual disturbance.  Respiratory: Negative for cough, chest tightness, shortness of breath and wheezing.   Cardiovascular: Negative for chest pain, palpitations and leg swelling.  Gastrointestinal: Negative for abdominal pain, constipation, diarrhea and vomiting.  Endocrine:  Negative.   Genitourinary: Negative.  Negative for difficulty urinating.  Musculoskeletal: Positive for arthralgias and back pain. Negative for myalgias.  Skin: Negative.  Negative for color change, pallor and rash.  Neurological: Positive for weakness and numbness. Negative for dizziness, speech difficulty and headaches.  Hematological: Negative for adenopathy. Does not bruise/bleed easily.  Psychiatric/Behavioral: Negative.     Objective:  BP 120/70 (BP Location: Left Arm, Patient Position: Sitting, Cuff Size: Normal)   Pulse 74   Temp 97.9 F (36.6 C) (Oral)   Resp 16   Ht 6' 1"  (1.854 m)   Wt 162 lb 8 oz (73.7 kg)   SpO2 95%   BMI 21.44 kg/m   BP Readings from Last 3 Encounters:  06/14/18 120/70  03/27/18 120/70  09/12/17 126/64    Wt Readings from Last 3 Encounters:  06/14/18 162 lb 8 oz (73.7 kg)  03/27/18 173 lb 4 oz (78.6 kg)  09/12/17 162 lb (73.5 kg)    Physical Exam  Constitutional: He is oriented to person, place, and time. No distress.  HENT:  Mouth/Throat: Oropharynx is clear and moist. No oropharyngeal exudate.  Eyes: Conjunctivae are normal. No scleral icterus.  Neck: Normal range of motion. Neck supple. No JVD present. No thyromegaly present.  Cardiovascular: Normal rate and normal heart sounds.  No murmur heard. Pulmonary/Chest: Effort normal and breath sounds normal. He has no wheezes. He has no rales.  Abdominal: Soft. Bowel  sounds are normal. He exhibits no mass. There is no tenderness.  Musculoskeletal: Normal range of motion. He exhibits no edema, tenderness or deformity.  Lymphadenopathy:    He has no cervical adenopathy.  Neurological: He is alert and oriented to person, place, and time. He displays atrophy. He exhibits abnormal muscle tone. He displays no seizure activity. Coordination and gait abnormal.  Reflex Scores:      Tricep reflexes are 0 on the right side and 0 on the left side.      Bicep reflexes are 0 on the right side and 0 on  the left side.      Brachioradialis reflexes are 0 on the right side and 0 on the left side.      Patellar reflexes are 0 on the right side and 0 on the left side.      Achilles reflexes are 0 on the right side and 0 on the left side. + SLR in RLE Diffuse weakness and atrophy in both lower extremities Gait is ataxic and he uses a cane for assistance  Skin: Skin is warm and dry. No rash noted. He is not diaphoretic.  Vitals reviewed.   Lab Results  Component Value Date   WBC 5.6 03/27/2018   HGB 11.5 (L) 03/27/2018   HCT 33.8 (L) 03/27/2018   PLT 199.0 03/27/2018   GLUCOSE 108 (H) 03/27/2018   CHOL 113 01/20/2017   TRIG 80 01/20/2017   HDL 51 01/20/2017   LDLCALC 46 01/20/2017   ALT 22 03/27/2018   AST 31 03/27/2018   NA 134 (L) 03/27/2018   K 4.5 03/27/2018   CL 101 03/27/2018   CREATININE 0.80 03/27/2018   BUN 22 03/27/2018   CO2 25 03/27/2018   TSH 2.90 03/27/2018   PSA 0.26 07/13/2013   INR 1.0 06/26/2009    No results found.  Assessment & Plan:   Rhys was seen today for back pain.  Diagnoses and all orders for this visit:  Chronic hip pain, bilateral- He will undergo an MRI of his hips to see if there is AVN, DJD, spurring, or occult fracture. -     MR HIP LEFT WO CONTRAST; Future -     MR HIP RIGHT WO CONTRAST; Future  Lumbar radiculitis- Tt sounds like he may have spinal stenosis.  I have ordered an MRI of his lumbar spine to see if he is a surgical candidate.  For now, he does not want me to prescribe anything for pain. -     MR Lumbar Spine Wo Contrast; Future  Acquired hypothyroidism- I will check his TSH and will adjust his dose if indicated. -     CBC with Differential/Platelet; Future -     Basic metabolic panel; Future -     TSH; Future  Macrocytic anemia- This has been a chronic stable condition probably caused by dapsone.  I will recheck his H&H to be sure that this is stable. -     CBC with Differential/Platelet; Future   I am having  [name] maintain his cyanocobalamin, levothyroxine, ELIQUIS, metoprolol succinate, atorvastatin, and dapsone.  No orders of the defined types were placed in this encounter.    Follow-up: Return in about 1 month (around 07/12/2018).  [name], MD

## 2018-06-14 NOTE — Patient Instructions (Signed)

## 2018-06-27 ENCOUNTER — Other Ambulatory Visit: Payer: Self-pay | Admitting: Internal Medicine

## 2018-06-27 ENCOUNTER — Ambulatory Visit
Admission: RE | Admit: 2018-06-27 | Discharge: 2018-06-27 | Disposition: A | Discharge status: Home / Self Care | Payer: Medicare Other | Source: Ambulatory Visit | Attending: Internal Medicine | Admitting: Internal Medicine

## 2018-06-27 DIAGNOSIS — M25551 Pain in right hip: Secondary | ICD-10-CM | POA: Diagnosis not present

## 2018-06-27 DIAGNOSIS — M545 Low back pain: Secondary | ICD-10-CM | POA: Diagnosis not present

## 2018-06-27 DIAGNOSIS — M25552 Pain in left hip: Principal | ICD-10-CM

## 2018-06-27 DIAGNOSIS — G8929 Other chronic pain: Secondary | ICD-10-CM

## 2018-06-27 DIAGNOSIS — M8008XG Age-related osteoporosis with current pathological fracture, vertebra(e), subsequent encounter for fracture with delayed healing: Secondary | ICD-10-CM | POA: Insufficient documentation

## 2018-06-27 DIAGNOSIS — M5416 Radiculopathy, lumbar region: Secondary | ICD-10-CM

## 2018-06-28 DIAGNOSIS — M4856XA Collapsed vertebra, not elsewhere classified, lumbar region, initial encounter for fracture: Secondary | ICD-10-CM | POA: Diagnosis not present

## 2018-06-28 DIAGNOSIS — M545 Low back pain: Secondary | ICD-10-CM | POA: Diagnosis not present

## 2018-06-29 ENCOUNTER — Telehealth: Payer: Self-pay | Admitting: Internal Medicine

## 2018-06-29 ENCOUNTER — Telehealth: Payer: Self-pay

## 2018-06-29 NOTE — Telephone Encounter (Signed)
Pt dropped off surgical clearance form from [facility] for Dr Ronnald Ramp to complete.  Once form is completed, please fax form to [phone] per instructions.  Form placed in Dr. Ronnald Ramp' box for pick up & completion.

## 2018-06-29 NOTE — Telephone Encounter (Signed)
   [facility] Pre-operative Risk Assessment    Request for surgical clearance:  1. What type of surgery is being performed? Kyphoplasty x2,3,5  2. When is this surgery scheduled? TBD  3. What type of clearance is required (medical clearance vs. Pharmacy clearance to hold med vs. Both)? Both  4. Are there any medications that need to be held prior to surgery and how long? Eliquis  5. Practice name and name of physician performing surgery? [facility] - Dr.Dehart Brooks  6. What is your office phone number [phone]   7.   What is your office fax number [phone]  8.   Anesthesia type (None, local, MAC, general) ? Not Specified    [name] 06/29/2018, 1:32 PM  _________________________________________________________________   (provider comments below)

## 2018-06-29 NOTE — Telephone Encounter (Signed)
   Primary Cardiologist:Brian Stanford Breed, MD  Chart reviewed as part of pre-operative protocol coverage. Because of [name] past medical history and time since last visit, he/she will require a follow-up visit in order to better assess preoperative cardiovascular risk. Last seen in 08/2017 and overdue for 6 month follow up.   Pre-op covering staff: - Please schedule appointment and call patient to inform them. - Please contact requesting surgeon's office via preferred method (i.e, phone, fax) to inform them of need for appointment prior to surgery.  Daune Perch, NP  06/29/2018, 4:40 PM

## 2018-06-29 NOTE — Telephone Encounter (Signed)
Left message for patient to call back regarding scheduling appointment for surgical clearance

## 2018-06-30 NOTE — Telephone Encounter (Signed)
Follow Up:    Pt returning your call from yesterday.

## 2018-06-30 NOTE — Telephone Encounter (Signed)
PT  AND EMERG ORHTO AWARE PRE OP  APPT WITH HAO MENG PA ON 07-12-18 AT 11:30 AM  A

## 2018-07-03 NOTE — Telephone Encounter (Signed)
Signed form faxed to number below. Pt informed

## 2018-07-06 ENCOUNTER — Ambulatory Visit: Payer: Medicare Other | Admitting: Adult Health

## 2018-07-06 ENCOUNTER — Telehealth: Payer: Self-pay

## 2018-07-06 ENCOUNTER — Ambulatory Visit (INDEPENDENT_AMBULATORY_CARE_PROVIDER_SITE_OTHER): Payer: Medicare Other | Admitting: Cardiology

## 2018-07-06 ENCOUNTER — Encounter: Payer: Self-pay | Admitting: Cardiology

## 2018-07-06 VITALS — BP 107/64 | HR 91 | Ht 73.0 in | Wt 154.0 lb

## 2018-07-06 DIAGNOSIS — Z01818 Encounter for other preprocedural examination: Secondary | ICD-10-CM

## 2018-07-06 DIAGNOSIS — Z7901 Long term (current) use of anticoagulants: Secondary | ICD-10-CM

## 2018-07-06 DIAGNOSIS — M8008XG Age-related osteoporosis with current pathological fracture, vertebra(e), subsequent encounter for fracture with delayed healing: Secondary | ICD-10-CM

## 2018-07-06 DIAGNOSIS — I4819 Other persistent atrial fibrillation: Secondary | ICD-10-CM

## 2018-07-06 DIAGNOSIS — I481 Persistent atrial fibrillation: Secondary | ICD-10-CM

## 2018-07-06 NOTE — Assessment & Plan Note (Signed)
CHADS VASC= 4 for age, history of stroke

## 2018-07-06 NOTE — Assessment & Plan Note (Signed)
Pt seen today prior to kyphoplasty

## 2018-07-06 NOTE — Patient Instructions (Signed)
Medication Instructions:  Your physician recommends that you continue on your current medications as directed. Please refer to the Current Medication list given to you today.  Labwork: None   Testing/Procedures: None   Follow-Up: Your physician wants you to follow-up in: 6 months with Dr Stanford Breed. You will receive a reminder letter in the mail two months in advance. If you don't receive a letter, please call our office to schedule the follow-up appointment.  Any Other Special Instructions Will Be Listed Below (If Applicable). If you need a refill on your cardiac medications before your next appointment, please call your pharmacy.

## 2018-07-06 NOTE — Assessment & Plan Note (Signed)
Permanent-rate controlled- asymptomatic

## 2018-07-06 NOTE — Progress Notes (Deleted)
Cardiology Office Note   Date:  07/06/2018   ID:  [name], DOB 08/17/1930, MRN [id number]  PCP:  [name], MD  Cardiologist:  Dr. Stanford Breed No chief complaint on file.    History of Present Illness: [name] is a 84 y.o. male who presents for ongoing assessment and management of atrial fibrillation on BB and Eliquis Recent echocardiogram on 5.03/2017 showed EF of 45%-50% with mildly dilated aortic root, moderate mitral regurgitation, moderate left atrial enlargement, severe right atrial enlargement and severe tricuspid regurgitation with severely elevated pulmonary pressures. Will need repeat echo.     Past Medical History:  Diagnosis Date  . Alcohol abuse   . Allergic dermatitis   . Atrial fibrillation ([place])   . Celiac disease   . CVA (cerebral infarction)   . DJD (degenerative joint disease)   . Macrocytic anemia   . Nonspecific (abnormal) findings on radiological and other examination of other intrathoracic organs   . Nonspecific abnormal results of thyroid function study   . Olecranon bursitis   . Prostate cancer ([place])   . Pure hypercholesterolemia   . Unspecified hemorrhoids without mention of complication     Past Surgical History:  Procedure Laterality Date  . prostate biopsy and see implantation    . WRIST FRACTURE SURGERY       Current Outpatient Medications  Medication Sig Dispense Refill  . atorvastatin (LIPITOR) 20 MG tablet TAKE 1 TABLET BY MOUTH EVERY DAY 90 tablet 1  . cyanocobalamin 1000 MCG tablet Take 100 mcg by mouth daily.    . dapsone 100 MG tablet TAKE 1/2 TABLET BY MOUTH ONCE DAILY 45 tablet 1  . ELIQUIS 5 MG TABS tablet TAKE 1 TABLET BY MOUTH TWICE A DAY 180 tablet 1  . levothyroxine (SYNTHROID, LEVOTHROID) 50 MCG tablet Take 1 tablet (50 mcg total) by mouth daily. 90 tablet 0  . metoprolol succinate (TOPROL-XL) 25 MG 24 hr tablet TAKE 1 TABLET BY MOUTH EVERY DAY 30 tablet 6   No current facility-administered medications for  this visit.     Allergies:   Patient has no known allergies.    Social History:  The patient  reports that he has never smoked. He has never used smokeless tobacco. He reports that he drinks alcohol.   Family History:  The patient's family history includes Heart disease in his unknown relative.    ROS: All other systems are reviewed and negative. Unless otherwise mentioned in H&P    PHYSICAL EXAM: VS:  There were no vitals taken for this visit. , BMI There is no height or weight on file to calculate BMI. GEN: Well nourished, well developed, in no acute distress  HEENT: normal  Neck: no JVD, carotid bruits, or masses Cardiac: ***RRR; no murmurs, rubs, or gallops,no edema  Respiratory:  clear to auscultation bilaterally, normal work of breathing GI: soft, nontender, nondistended, + BS MS: no deformity or atrophy  Skin: warm and dry, no rash Neuro:  Strength and sensation are intact Psych: euthymic mood, full affect   EKG:  EKG {ACTION; IS/IS [id number]} ordered today. The ekg ordered today demonstrates ***   Recent Labs: 03/27/2018: ALT 22; BUN 22; Creatinine, Ser 0.80; Hemoglobin 11.5; Platelets 199.0; Potassium 4.5; Sodium 134; TSH 2.90    Lipid Panel    Component Value Date/Time   CHOL 113 01/20/2017 1129   TRIG 80 01/20/2017 1129   HDL 51 01/20/2017 1129   CHOLHDL 2.2 01/20/2017 1129   VLDL 16 01/20/2017  [place] 01/20/2017 1129      Wt Readings from Last 3 Encounters:  06/14/18 162 lb 8 oz (73.7 kg)  03/27/18 173 lb 4 oz (78.6 kg)  09/12/17 162 lb (73.5 kg)      Other studies Reviewed: Additional studies/ records that were reviewed today include: ***. Review of the above records demonstrates: ***   ASSESSMENT AND PLAN:  1.  ***   Current medicines are reviewed at length with the patient today.    Labs/ tests ordered today include: *** Phill Myron. [place] Pugh, ANP, AACC   07/06/2018 7:38 AM     [facility] [address] [place], [place] [address] Phone: [phone]; Fax: [phone]

## 2018-07-06 NOTE — Telephone Encounter (Signed)
Pt is wanting to be prescribed Gabapentin. Please advise

## 2018-07-06 NOTE — Assessment & Plan Note (Signed)
Planned kyphoplasty.

## 2018-07-06 NOTE — Progress Notes (Signed)
07/06/2018 [name]   September 01, 1930  [id number]  Primary Physician [name], MD Primary Cardiologist: Dr Stanford Breed  HPI:  Pleasant 84 y/o male with a history of CAF and DJD (spine). He is on Eliquis- CHADS VASC=4 for age and remote Lt brain stroke in the 55's. He is in the office today for per op clearance prior to kyphoplasty. His last echo was May 2018- Dr Stanford Breed reviewed this and felt his LVF was normal (reported to be 45-50%). He has no history of CAD or MI.  He denies chest pain or unusual dyspnea.    Current Outpatient Medications  Medication Sig Dispense Refill  . atorvastatin (LIPITOR) 20 MG tablet TAKE 1 TABLET BY MOUTH EVERY DAY 90 tablet 1  . cyanocobalamin 1000 MCG tablet Take 100 mcg by mouth daily.    . dapsone 100 MG tablet TAKE 1/2 TABLET BY MOUTH ONCE DAILY 45 tablet 1  . ELIQUIS 5 MG TABS tablet TAKE 1 TABLET BY MOUTH TWICE A DAY 180 tablet 1  . levothyroxine (SYNTHROID, LEVOTHROID) 50 MCG tablet Take 1 tablet (50 mcg total) by mouth daily. 90 tablet 0  . metoprolol succinate (TOPROL-XL) 25 MG 24 hr tablet TAKE 1 TABLET BY MOUTH EVERY DAY 30 tablet 6   No current facility-administered medications for this visit.     No Known Allergies  Past Medical History:  Diagnosis Date  . Alcohol abuse   . Allergic dermatitis   . Atrial fibrillation ([place])   . Celiac disease   . CVA (cerebral infarction)   . DJD (degenerative joint disease)   . Macrocytic anemia   . Nonspecific (abnormal) findings on radiological and other examination of other intrathoracic organs   . Nonspecific abnormal results of thyroid function study   . Olecranon bursitis   . Prostate cancer ([place])   . Pure hypercholesterolemia   . Unspecified hemorrhoids without mention of complication     Social History   Socioeconomic History  . Marital status: Married    Spouse name: Not on file  . Number of children: 1  . Years of education: Not on file  . Highest education level: Not on  file  Occupational History  . Occupation: security for [employer]  Social Needs  . Financial resource strain: Not on file  . Food insecurity:    Worry: Not on file    Inability: Not on file  . Transportation needs:    Medical: Not on file    Non-medical: Not on file  Tobacco Use  . Smoking status: Never Smoker  . Smokeless tobacco: Never Used  Substance and Sexual Activity  . Alcohol use: Yes    Comment: 3-4 beers per day  . Drug use: Not on file  . Sexual activity: Not on file  Lifestyle  . Physical activity:    Days per week: Not on file    Minutes per session: Not on file  . Stress: Not on file  Relationships  . Social connections:    Talks on phone: Not on file    Gets together: Not on file    Attends religious service: Not on file    Active member of club or organization: Not on file    Attends meetings of clubs or organizations: Not on file    Relationship status: Not on file  . Intimate partner violence:    Fear of current or ex partner: Not on file    Emotionally abused: Not on file  Physically abused: Not on file    Forced sexual activity: Not on file  Other Topics Concern  . Not on file  Social History Narrative  . Not on file     Family History  Problem Relation Age of Onset  . Heart disease Unknown        No family history     Review of Systems: General: negative for chills, fever, night sweats or weight changes.  Cardiovascular: negative for chest pain, dyspnea on exertion, edema, orthopnea, palpitations, paroxysmal nocturnal dyspnea or shortness of breath Dermatological: negative for rash Respiratory: negative for cough or wheezing Urologic: negative for hematuria Abdominal: negative for nausea, vomiting, diarrhea, bright red blood per rectum, melena, or hematemesis Neurologic: negative for visual changes, syncope, or dizziness All other systems reviewed and are otherwise negative except as noted above.    Blood pressure 107/64, pulse 91, height  6' 1"  (1.854 m), weight 154 lb (69.9 kg).  General appearance: alert, cooperative, appears stated age and no distress Neck: no carotid bruit and no JVD Lungs: clear to auscultation bilaterally and kyphosis Heart: irregularly irregular rhythm Extremities: nom edema Skin: cool, pale, dry Neurologic: Grossly normal  EKG AF with poor anterior RW  ASSESSMENT AND PLAN:   Pre-operative clearance Pt seen today prior to kyphoplasty  Atrial fibrillation Permanent-rate controlled- asymptomatic  Chronic anticoagulation CHADS VASC= 4 for age, history of stroke  Vertebral fracture, osteoporotic, with delayed healing, subsequent encounter Planned kyphoplasty.    PLAN   Chart reviewed and patient seen and examined today as part of pre-operative protocol coverage. Given past medical history and time since last visit, based on ACC/AHA guidelines, [name] would be at acceptable risk for the planned procedure without further cardiovascular testing.   Hold Eliquis 72 hours pre op per pharmacy.   I will route this recommendation to the requesting party via Epic fax function and remove from pre-op pool.  Please call with questions.  Kerin Ransom, PA-C 07/06/2018, 12:53 PM     Kerin Ransom PA-C 07/06/2018 12:53 PM

## 2018-07-08 ENCOUNTER — Other Ambulatory Visit: Payer: Self-pay | Admitting: Internal Medicine

## 2018-07-08 DIAGNOSIS — M8008XG Age-related osteoporosis with current pathological fracture, vertebra(e), subsequent encounter for fracture with delayed healing: Secondary | ICD-10-CM

## 2018-07-08 MED ORDER — GABAPENTIN 100 MG PO CAPS: 100.0000 mg | ORAL_CAPSULE | Freq: Three times a day (TID) | ORAL | 3 refills | Status: DC

## 2018-07-08 NOTE — Telephone Encounter (Signed)
RX sent

## 2018-07-10 NOTE — Telephone Encounter (Signed)
Pt informed rx has been sent.  

## 2018-07-10 NOTE — H&P (Signed)
Patient ID: ANTOINETTE HASKETT MRN: [id number] DOB/AGE: 82-13-1931 82 y.o.  Admit date: (Not on file)  Admission Diagnoses:  Lumbar fractures  HPI: Very pleasant 84 year old male patient presents to clinic for his history and physical prior to a 3 level lumbar kyphoplasty.  Patient does have a history of a CVA many years ago.  Patient does have history of A. Fib and is currently on Eliquis he understands to have stopped the Eliquis prior to surgical intervention.  Past Medical History: Past Medical History:  Diagnosis Date  . Alcohol abuse   . Allergic dermatitis   . Atrial fibrillation ([place])   . Celiac disease   . CVA (cerebral infarction)   . DJD (degenerative joint disease)   . Macrocytic anemia   . Nonspecific (abnormal) findings on radiological and other examination of other intrathoracic organs   . Nonspecific abnormal results of thyroid function study   . Olecranon bursitis   . Prostate cancer ([place])   . Pure hypercholesterolemia   . Unspecified hemorrhoids without mention of complication     Surgical History: Past Surgical History:  Procedure Laterality Date  . prostate biopsy and see implantation    . WRIST FRACTURE SURGERY      Family History: Family History  Problem Relation Age of Onset  . Heart disease Unknown        No family history    Social History: Social History   Socioeconomic History  . Marital status: Married    Spouse name: Not on file  . Number of children: 1  . Years of education: Not on file  . Highest education level: Not on file  Occupational History  . Occupation: security for [employer]  Social Needs  . Financial resource strain: Not on file  . Food insecurity:    Worry: Not on file    Inability: Not on file  . Transportation needs:    Medical: Not on file    Non-medical: Not on file  Tobacco Use  . Smoking status: Never Smoker  . Smokeless tobacco: Never Used  Substance and Sexual Activity  . Alcohol use: Yes    Comment: 3-4  beers per day  . Drug use: Not on file  . Sexual activity: Not on file  Lifestyle  . Physical activity:    Days per week: Not on file    Minutes per session: Not on file  . Stress: Not on file  Relationships  . Social connections:    Talks on phone: Not on file    Gets together: Not on file    Attends religious service: Not on file    Active member of club or organization: Not on file    Attends meetings of clubs or organizations: Not on file    Relationship status: Not on file  . Intimate partner violence:    Fear of current or ex partner: Not on file    Emotionally abused: Not on file    Physically abused: Not on file    Forced sexual activity: Not on file  Other Topics Concern  . Not on file  Social History Narrative  . Not on file    Allergies: Patient has no known allergies.  Medications: I have reviewed the patient's current medications.  Vital Signs: No data found.  Radiology: Mr Lumbar Spine Wo Contrast  Result Date: 06/27/2018 CLINICAL DATA:  84 y/o M; 6 weeks of severe lower back pain after lifting an item out of a car. EXAM: MRI  LUMBAR SPINE WITHOUT CONTRAST TECHNIQUE: Multiplanar, multisequence MR imaging of the lumbar spine was performed. No intravenous contrast was administered. COMPARISON:  05/26/2017 CT abdomen and pelvis. FINDINGS: Segmentation:  Standard. Alignment:  Physiologic. Vertebrae: L2, L3, and L5 compression deformities with edema indicating recent injury: 1. L2 superior endplate fracture with up to 40% central loss of vertebral body height. 2. Progression of L3 inferior endplate compression deformity with 60% loss of vertebral body height. Edema extends into posterior elements. 3. L5 superior endplate fracture with 44% loss of vertebral body height. Stable moderate T11 compression deformity with large superior endplate Schmorl's node. Mildly increased signal is present within the intervertebral disc spaces at L1 through S1. Mild edema within the  paravertebral soft tissues at the levels of acute cord compression deformities, no discrete fluid collection identified on this noncontrast examination. Conus medullaris and cauda equina: Conus extends to the L2 level. Conus and cauda equina appear normal. Paraspinal and other soft tissues: 4 cm cyst in the right kidney upper pole. Disc levels: T12-L1: Small disc bulge. No significant foraminal or canal stenosis. L1-2: Small disc bulge with mild facet hypertrophy. No significant foraminal or canal stenosis. L2-3: Small disc bulge with moderate bilateral facet hypertrophy. Mild edema in the anterior epidural space. Mild bilateral foraminal stenosis. Moderate canal stenosis. L3-4: Disc bulge with endplate marginal osteophytes and moderate facet hypertrophy. Small juxta-articular cyst posterior to the right facet in paraspinal muscles. Moderate bilateral foraminal stenosis. Mild canal stenosis. L4-5: Disc bulge with endplate marginal osteophytes and moderate bilateral facet hypertrophy. Moderate bilateral foraminal stenosis and mild canal stenosis. L5-S1: Disc bulge with endplate marginal osteophytes and moderate bilateral facet hypertrophy. Moderate bilateral foraminal stenosis. No significant canal stenosis. IMPRESSION: 1. L2, L3, and L5 compression deformities with edema indicating recent injury. 2. Stable moderate T11 compression deformity. 3. L1-S1 intervertebral disc edema and mild edema within the paravertebral soft tissues through the levels of compression deformities, likely related to fractures. Underlying infection is unlikely. Electronically Signed   By: Kristine Garbe M.D.   On: 06/27/2018 15:55   Mr Hip Right Wo Contrast  Result Date: 06/27/2018 : Please see accession number [id number] for the reports for MRI of the right hip and MRI of the left hip. Electronically Signed   By: Van Clines M.D.   On: 06/27/2018 16:41   Mr Hip Left Wo Contrast  Result Date: 06/27/2018 CLINICAL DATA:   Back pain and bilateral hip pain. EXAM: MRI OF THE LEFT HIP WITHOUT CONTRAST MRI OF THE RIGHT HIP WITHOUT CONTRAST TECHNIQUE: Multiplanar, multisequence MR imaging was performed. No intravenous contrast was administered. COMPARISON:  CT pelvis 05/26/2017 FINDINGS: LEFT HIP Bones: Degenerative subcortical cyst formation in the left acetabular roof. Spurring observed at the ischial tuberosity along the hamstring attachment site. Left sacroiliac joint unremarkable. There is mild spurring of the acetabulum and of the left femoral head. Articular cartilage and labrum Articular cartilage:  Moderate degenerative chondral thinning. Labrum: Severely degenerated and irregular left acetabular labrum, with anterior scalloped irregularity along the labrum on image 14/4, appearance favoring degenerative labral tear involving the anterior and superior labrum. Joint or bursal effusion Joint effusion:  Absent Bursae:  Mild iliopsoas bursitis. Muscles and tendons Muscles and tendons: Proximal hamstring tendinopathy and mild partial tearing as shown on images 1 through 8 of series 8. Other findings Miscellaneous: Small bladder diverticula. Degenerative disc disease and spondylosis at L5-S1. RIGHT HIP Bones: Mild subcortical marrow edema along the acetabular roof, although less striking than on the contralateral side.  Mild spurring along the right acetabulum and right femoral head. Articular cartilage and labrum Articular cartilage:  Mild degenerative chondral thinning. Labrum: Accentuated proton density weighted compatible signal in the anterior superior labrum with degeneration but without a well-defined tear. Joint or bursal effusion Joint effusion:  Absent Bursae: Minimal iliopsoas bursitis. Muscles and tendons Muscles and tendons:  Unremarkable IMPRESSION: 1. On the left side, there is suspected degenerative tearing of the superior labrum along with moderate findings of osteoarthritis of the hip and proximal hamstring tendinopathy  and mild partial tearing of the hamstring. 2. On the right side, there is mild chondral thinning and mild spurring without a definite acetabular labral tear. 3. Bilateral mild iliopsoas bursitis. 4. Degenerative disc disease at L5-S1. Electronically Signed   By: Van Clines M.D.   On: 06/27/2018 16:12    Labs: No results for input(s): WBC, RBC, HCT, PLT in the last 72 hours. No results for input(s): NA, K, CL, CO2, BUN, CREATININE, GLUCOSE, CALCIUM in the last 72 hours. No results for input(s): LABPT, INR in the last 72 hours.  Review of Systems: ROS  Physical Exam: There is no height or weight on file to calculate BMI.  Physical Exam  Constitutional: He is oriented to person, place, and time. He appears well-developed and well-nourished.  HENT:  Head: Normocephalic.  Eyes: Pupils are equal, round, and reactive to light.  Cardiovascular: Normal rate and regular rhythm.  Respiratory: Effort normal and breath sounds normal.  GI: Soft. Bowel sounds are normal.  Neurological: He is alert and oriented to person, place, and time.  Skin: Skin is warm and dry.  Psychiatric: He has a normal mood and affect. His behavior is normal. Judgment and thought content normal.   No motor deficits of the patient's lower extremities.  Patient presents to the clinic with a rolling walker.  He says ambulation increases the back pain no straight leg raise reflexes are symmetric compartments are soft, 2+ pulses in lower extremities are symmetric negative straight leg raise negative Hoffmann's  The patient had an MRI performed at [facility] on 06/27/2018 it is significant for L2-L3 and L5 compression deformities with edema which does indicate that these are recent injuries.  There is a stable T11 compression deformity   Assessment and Plan: Risks and benefits of surgery were discussed with the patient. These include: Infection, bleeding, death, stroke, paralysis, ongoing or worse pain, need for additional  surgery, leak of spinal fluid, adjacent segment degeneration requiring additional surgery, post-operative hematoma formation that can result in neurological compromise and the need for urgent/emergent re-operation. Risk of deep venous thrombosis (DVT) and the need for additional treatment.   Goal of surgery: Reduce (not eliminate) pain, and improve quality of life.   Ronette Deter, PAC for Melina [facility], MD [facility] [phone]

## 2018-07-11 ENCOUNTER — Other Ambulatory Visit: Payer: Self-pay

## 2018-07-11 ENCOUNTER — Encounter (HOSPITAL_COMMUNITY): Payer: Self-pay

## 2018-07-11 ENCOUNTER — Other Ambulatory Visit (HOSPITAL_COMMUNITY): Payer: Self-pay | Admitting: *Deleted

## 2018-07-11 ENCOUNTER — Encounter (HOSPITAL_COMMUNITY)
Admission: RE | Admit: 2018-07-11 | Discharge: 2018-07-11 | Disposition: A | Discharge status: Home / Self Care | Payer: Medicare Other | Source: Ambulatory Visit | Attending: Orthopedic Surgery | Admitting: Orthopedic Surgery

## 2018-07-11 DIAGNOSIS — M8008XA Age-related osteoporosis with current pathological fracture, vertebra(e), initial encounter for fracture: Secondary | ICD-10-CM | POA: Diagnosis not present

## 2018-07-11 DIAGNOSIS — Z8546 Personal history of malignant neoplasm of prostate: Secondary | ICD-10-CM | POA: Diagnosis not present

## 2018-07-11 DIAGNOSIS — Z8673 Personal history of transient ischemic attack (TIA), and cerebral infarction without residual deficits: Secondary | ICD-10-CM | POA: Diagnosis not present

## 2018-07-11 DIAGNOSIS — Z79899 Other long term (current) drug therapy: Secondary | ICD-10-CM | POA: Diagnosis not present

## 2018-07-11 DIAGNOSIS — E7800 Pure hypercholesterolemia, unspecified: Secondary | ICD-10-CM | POA: Diagnosis not present

## 2018-07-11 DIAGNOSIS — R2681 Unsteadiness on feet: Secondary | ICD-10-CM | POA: Diagnosis not present

## 2018-07-11 DIAGNOSIS — Z7901 Long term (current) use of anticoagulants: Secondary | ICD-10-CM | POA: Diagnosis not present

## 2018-07-11 DIAGNOSIS — I4891 Unspecified atrial fibrillation: Secondary | ICD-10-CM | POA: Diagnosis not present

## 2018-07-11 DIAGNOSIS — Z79890 Hormone replacement therapy (postmenopausal): Secondary | ICD-10-CM | POA: Diagnosis not present

## 2018-07-11 HISTORY — DX: Constipation, unspecified: K59.00

## 2018-07-11 LAB — COMPREHENSIVE METABOLIC PANEL
ALK PHOS: 92 U/L (ref 38–126)
ALT: 23 U/L (ref 0–44)
AST: 35 U/L (ref 15–41)
Albumin: 3.8 g/dL (ref 3.5–5.0)
Anion gap: 8 (ref 5–15)
BUN: 17 mg/dL (ref 8–23)
CALCIUM: 9.3 mg/dL (ref 8.9–10.3)
CHLORIDE: 99 mmol/L (ref 98–111)
CO2: 25 mmol/L (ref 22–32)
CREATININE: 0.63 mg/dL (ref 0.61–1.24)
Glucose, Bld: 88 mg/dL (ref 70–99)
Potassium: 4.6 mmol/L (ref 3.5–5.1)
Sodium: 132 mmol/L — ABNORMAL LOW (ref 135–145)
Total Bilirubin: 1.2 mg/dL (ref 0.3–1.2)
Total Protein: 7.8 g/dL (ref 6.5–8.1)

## 2018-07-11 LAB — CBC
HCT: 34.5 % — ABNORMAL LOW (ref 39.0–52.0)
HEMOGLOBIN: 11.1 g/dL — AB (ref 13.0–17.0)
MCH: 34.2 pg — AB (ref 26.0–34.0)
MCHC: 32.2 g/dL (ref 30.0–36.0)
MCV: 106.2 fL — AB (ref 78.0–100.0)
Platelets: 361 10*3/uL (ref 150–400)
RBC: 3.25 MIL/uL — AB (ref 4.22–5.81)
RDW: 13.6 % (ref 11.5–15.5)
WBC: 5.7 10*3/uL (ref 4.0–10.5)

## 2018-07-11 NOTE — Pre-Procedure Instructions (Signed)
[name]  07/11/2018    Your procedure is scheduled on Thursday, July 13, 2018 at 10:30 AM.   Report to [facility] Entrance "A" Admitting Office at 8:30 AM.   Call this number if you have problems the morning of surgery: [phone]   Questions prior to day of surgery, please call [phone] between 8 & 4 PM.   Remember:  Do not eat or drink after midnight Wednesday, 07/12/18.  Take these medicines the morning of surgery with A SIP OF WATER: Gabapentin (Neurontin), Levothyroxine (Synthroid), Metoprolol (Toprol XL)  Stop Dapsone as of today. Stop Eliquis as instructed by cardiologist. Do not use NSAIDS (Ibuprofen, Aleve, etc) or Aspirin products prior to surgery.    Do not wear jewelry.  Do not wear lotions, powders, cologne or deodorant.  Men may shave face and neck.  Do not bring valuables to the hospital.  [facility] is not responsible for any belongings or valuables.  Contacts, dentures or bridgework may not be worn into surgery.  Leave your suitcase in the car.  After surgery it may be brought to your room.  For patients admitted to the hospital, discharge time will be determined by your treatment team.  Patients discharged the day of surgery will not be allowed to drive home.   [place] - Preparing for Surgery  Before surgery, you can play an important role.  Because skin is not sterile, your skin needs to be as free of germs as possible.  You can reduce the number of germs on you skin by washing with CHG (chlorahexidine gluconate) soap before surgery.  CHG is an antiseptic cleaner which kills germs and bonds with the skin to continue killing germs even after washing.  Oral Hygiene is also important in reducing the risk of infection.  Remember to brush your teeth with your regular toothpaste the morning of surgery.  Please DO NOT use if you have an allergy to CHG or antibacterial soaps.  If your skin becomes reddened/irritated stop using the CHG and  inform your nurse when you arrive at Short Stay.  Do not shave (including legs and underarms) for at least 48 hours prior to the first CHG shower.  You may shave your face.  Please follow these instructions carefully:   1.  Shower with CHG Soap the night before surgery and the morning of Surgery.  2.  If you choose to wash your hair, wash your hair first as usual with your normal shampoo.  3.  After you shampoo, rinse your hair and body thoroughly to remove the shampoo. 4.  Use CHG as you would any other liquid soap.  You can apply chg directly to the skin and wash gently with a      scrungie or washcloth.           5.  Apply the CHG Soap to your body ONLY FROM THE NECK DOWN.   Do not use on open wounds or open sores. Avoid contact with your eyes, ears, mouth and genitals (private parts).  Wash genitals (private parts) with your normal soap.  6.  Wash thoroughly, paying special attention to the area where your surgery will be performed.  7.  Thoroughly rinse your body with warm water from the neck down.  8.  DO NOT shower/wash with your normal soap after using and rinsing off the CHG Soap.  9.  Pat yourself dry with a clean towel.  10.  Wear clean pajamas.            11.  Place clean sheets on your bed the night of your first shower and do not sleep with pets.  Day of Surgery  Shower as above. Do not apply any lotions/deodorants the morning of surgery.   Please wear clean clothes to the hospital. Remember to brush your teeth with toothpaste.   Please read over the fact sheets that you were given.

## 2018-07-11 NOTE — Progress Notes (Signed)
Pt has hx of A-fib, pt's cardiologist is Dr. Stanford Breed. Cardiac clearance in Epic on 07/06/18. Pt denies any recent chest pain or sob. Pt is not diabetic and states he's never been diagnosed with HTN.

## 2018-07-12 ENCOUNTER — Encounter (HOSPITAL_COMMUNITY): Payer: Self-pay

## 2018-07-12 ENCOUNTER — Encounter: Payer: Self-pay | Admitting: Cardiology

## 2018-07-12 ENCOUNTER — Telehealth: Payer: Self-pay | Admitting: Cardiology

## 2018-07-12 ENCOUNTER — Ambulatory Visit: Payer: Medicare Other | Admitting: Physician Assistant

## 2018-07-12 NOTE — Progress Notes (Signed)
Called by anesthesia APP about Mr Golson. I was asked earlier today if Mr Ky surgery should be held secondary to his abnormal echo. I reviewed this and said no, he should be OK with surgery. I was called back later in the day apparently the anesthesiologist wanted dr Stanford Breed to confirm this. Dr Stanford Breed is not in the office today and is in fact out on vacation, he will be back tomorrow. I will ask him to review and reply to anesthesia.   Kerin Ransom PA-C 07/12/2018 4:02 PM

## 2018-07-12 NOTE — Telephone Encounter (Signed)
New Message:      Ebony Hail is calling from the hospital to make sure the pt was not supposed to have another echo before his surgery on tomorrow. She states that Stanford Breed stated he should but when he saw [facility] for this clearance last week nothing was mentioned. She states the pt is due to have surgery on tomorrow. She would like a call back asap.

## 2018-07-12 NOTE — Progress Notes (Signed)
Anesthesia Chart Review:  Case:  [id number] Date/Time:  07/13/18 1013   Procedure:  KYPHOPLASTY L2, L3, L5 (N/A ) - 120 mins   Anesthesia type:  General   Pre-op diagnosis:  Lumbar compression fractures   Location:  [facility] OR ROOM 04 / [place] OR   Surgeon:  Melina Schools, MD      DISCUSSION: Patient is an 84 year old male scheduled for the above procedure.  History includes never smoker, hypercholesterolemia, celiac disease, ETOH abuse (reported heavy drinking in the past, now "occasional"), prostate cancer (s/p radioactive seed implant '98), CVA '87, macrocytic anemia, afib.   VS: BP 127/74   Pulse 78   Temp 36.6 C   Resp 20   Ht 6' 1"  (1.854 m)   Wt 153 lb (69.4 kg)   SpO2 95%   BMI 20.19 kg/m   PROVIDERS: [name], MD is PCP Kirk Ruths, MD is cardiologist. He last saw Dr. Stanford Breed on 09/12/17. He reviewed 04/2017 echo that showed EF 45-50% with diffuse hypokinesis, moderate MR, severe TR and severe pulmonary hypertension (70 mmHg). He felt LVF appeared normal. If regards to other findings, he was planning to manage conservatively given patient's age and co-morbidities. Continue Lasix PRN with repeat echo in ~ 6 months planned. He was last seen by cardiology on 07/06/18 with Kerin Ransom, PA-C for follow-up and preoperative evaluation. He wrote, "...based on ACC/AHA guidelines, JAVEON MACMURRAY would be at acceptable risk for the planned procedure without further cardiovascular testing. Hold Eliquis 72 hours pre op per pharmacy."  Last dose 07/10/18.  Reviewed above with anesthesiologist Lillia Abed, MD. He wanted me to clarify that patient would not need echo prior to surgery since repeat echo ~ 02/2018 previously mentioned when he saw Dr. Stanford Breed on 09/12/17. Unfortunately, Dr. Stanford Breed is out of the office today, but I was able to speak with Kerin Ransom, PA-C. Based on his recent evaluation of Mr. Knighton, he still does not think patient requires an echo prior to surgery, but that if  anesthesiologist wanted to clarify then Dr. Stanford Breed would be available on 07/13/18 (see 07/12/18 telephone encounter by Kerin Ransom, PA-C). I discussed this with Dr. Conrad Elsmore and our Holding RN floor coordinator. Patient's assigned anesthesiologist to be notified once patient arrives. Anesthesiologist can evaluate Mr. Sissel and review available records. If still with questions anesthesiologist can contact Dr. Stanford Breed or covering physician to further discuss. I have updated Judeen Hammans at Dr. Rolena Infante' office.    LABS: Labs reviewed: Acceptable for surgery. He is for PT/INR on the morning of surgery.  (all labs ordered are listed, but only abnormal results are displayed)  Labs Reviewed  CBC - Abnormal; Notable for the following components:      Result Value   RBC 3.25 (*)    Hemoglobin 11.1 (*)    HCT 34.5 (*)    MCV 106.2 (*)    MCH 34.2 (*)    All other components within normal limits  COMPREHENSIVE METABOLIC PANEL - Abnormal; Notable for the following components:   Sodium 132 (*)    All other components within normal limits     IMAGES: MRI L-spine 06/27/18: IMPRESSION: 1. L2, L3, and L5 compression deformities with edema indicating recent injury. 2. Stable moderate T11 compression deformity. 3. L1-S1 intervertebral disc edema and mild edema within the paravertebral soft tissues through the levels of compression deformities, likely related to fractures. Underlying infection is unlikely.   EKG: EKG 07/06/18: Afib at 91 bpm, LAD. Septal infarct (age undetermined).  CV: Echo 04/22/17: Study Conclusions - Left ventricle: The cavity size was normal. Systolic function was   mildly reduced. The estimated ejection fraction was in the range   of 45% to 50%. Diffuse hypokinesis. - Aortic valve: Valve mobility was restricted. Mean gradient (S): 5   mm Hg. Peak gradient (S): 8 mm Hg. - Aortic root: The aortic root wasmildly dilated measuring 40 mm. - Mitral valve: Calcified annulus. Mildly  thickened leaflets .   There was moderate regurgitation. - Left atrium: The atrium was moderately dilated. - Right ventricle: Systolic function was normal. - Right atrium: The atrium was severely dilated. - Tricuspid valve: There was severe regurgitation. - Pulmonary arteries: Systolic pressure was severely increased. PA   peak pressure: 70 mm Hg (S). - Inferior vena cava: The vessel was dilated. The respirophasic   diameter changes were blunted (< 50%), consistent with elevated   central venous pressure. - Pericardium, extracardiac: There was no pericardial effusion. Impressions: - Mildly decreased LVEF 45-50% with diffuse hypokinesis.   Moderate mitral regurgitation (PISA 0.5).   Severe tricuspid regurgitation, severe pulmonary hypertension. (Per 09/12/17 office note by Dr. Stanford Breed, repeat echo in six months planned. Comparison echo 01/21/16: EF 55-60%, mod-severe MR, mod TR, PA peak pressure 39 mmHg.)  He denied prior stress test or cardiac cath. By cardiology notes, he has no known history of CAD or MI.   Past Medical History:  Diagnosis Date  . Alcohol abuse   . Allergic dermatitis   . Atrial fibrillation ([place])   . Celiac disease   . Constipation   . CVA (cerebral infarction) 1987   blood clot, right side and speech issues, recovered quickly  . DJD (degenerative joint disease)   . Macrocytic anemia   . Nonspecific (abnormal) findings on radiological and other examination of other intrathoracic organs   . Nonspecific abnormal results of thyroid function study   . Olecranon bursitis   . Prostate cancer ([place])   . Pure hypercholesterolemia   . Unspecified hemorrhoids without mention of complication     Past Surgical History:  Procedure Laterality Date  . COLONOSCOPY    . prostate biopsy and see implantation    . WRIST FRACTURE SURGERY      MEDICATIONS: . atorvastatin (LIPITOR) 20 MG tablet  . cyanocobalamin 1000 MCG tablet  . dapsone 100 MG tablet  . ELIQUIS 5 MG  TABS tablet  . gabapentin (NEURONTIN) 100 MG capsule  . levothyroxine (SYNTHROID, LEVOTHROID) 50 MCG tablet  . metoprolol succinate (TOPROL-XL) 25 MG 24 hr tablet   No current facility-administered medications for this encounter.     George Hugh [facility] Short Stay Center/Anesthesiology Phone [phone] 07/12/2018 5:04 PM

## 2018-07-12 NOTE — Telephone Encounter (Signed)
Per Kerin Ransom PA, [place] with preop advised that the patient does not need an Echo prior to scheduled Kyphoplasty tomorrow.

## 2018-07-13 ENCOUNTER — Ambulatory Visit (HOSPITAL_COMMUNITY): Payer: Medicare Other | Admitting: Vascular Surgery

## 2018-07-13 ENCOUNTER — Encounter (HOSPITAL_COMMUNITY): Payer: Self-pay | Admitting: Urology

## 2018-07-13 ENCOUNTER — Observation Stay (HOSPITAL_COMMUNITY): Payer: Medicare Other

## 2018-07-13 ENCOUNTER — Observation Stay (HOSPITAL_COMMUNITY)
Admission: RE | Admit: 2018-07-13 | Discharge: 2018-07-14 | Disposition: A | Discharge status: Home / Self Care | Payer: Medicare Other | Source: Ambulatory Visit | Attending: Orthopedic Surgery | Admitting: Orthopedic Surgery

## 2018-07-13 ENCOUNTER — Ambulatory Visit (HOSPITAL_COMMUNITY): Payer: Medicare Other | Admitting: Certified Registered"

## 2018-07-13 ENCOUNTER — Encounter (HOSPITAL_COMMUNITY)
Admission: RE | Disposition: A | Discharge status: Home / Self Care | Payer: Self-pay | Source: Ambulatory Visit | Attending: Orthopedic Surgery

## 2018-07-13 DIAGNOSIS — I4891 Unspecified atrial fibrillation: Secondary | ICD-10-CM | POA: Diagnosis not present

## 2018-07-13 DIAGNOSIS — R2681 Unsteadiness on feet: Secondary | ICD-10-CM | POA: Diagnosis not present

## 2018-07-13 DIAGNOSIS — Z8673 Personal history of transient ischemic attack (TIA), and cerebral infarction without residual deficits: Secondary | ICD-10-CM | POA: Diagnosis not present

## 2018-07-13 DIAGNOSIS — E7800 Pure hypercholesterolemia, unspecified: Secondary | ICD-10-CM | POA: Diagnosis not present

## 2018-07-13 DIAGNOSIS — E785 Hyperlipidemia, unspecified: Secondary | ICD-10-CM | POA: Diagnosis not present

## 2018-07-13 DIAGNOSIS — Z7901 Long term (current) use of anticoagulants: Secondary | ICD-10-CM | POA: Insufficient documentation

## 2018-07-13 DIAGNOSIS — M8008XA Age-related osteoporosis with current pathological fracture, vertebra(e), initial encounter for fracture: Principal | ICD-10-CM | POA: Insufficient documentation

## 2018-07-13 DIAGNOSIS — Z79890 Hormone replacement therapy (postmenopausal): Secondary | ICD-10-CM | POA: Insufficient documentation

## 2018-07-13 DIAGNOSIS — M4856XA Collapsed vertebra, not elsewhere classified, lumbar region, initial encounter for fracture: Secondary | ICD-10-CM | POA: Diagnosis not present

## 2018-07-13 DIAGNOSIS — Z981 Arthrodesis status: Secondary | ICD-10-CM | POA: Diagnosis not present

## 2018-07-13 DIAGNOSIS — Z8546 Personal history of malignant neoplasm of prostate: Secondary | ICD-10-CM | POA: Insufficient documentation

## 2018-07-13 DIAGNOSIS — E039 Hypothyroidism, unspecified: Secondary | ICD-10-CM | POA: Diagnosis not present

## 2018-07-13 DIAGNOSIS — Z9889 Other specified postprocedural states: Secondary | ICD-10-CM

## 2018-07-13 DIAGNOSIS — Z79899 Other long term (current) drug therapy: Secondary | ICD-10-CM | POA: Diagnosis not present

## 2018-07-13 DIAGNOSIS — Z419 Encounter for procedure for purposes other than remedying health state, unspecified: Secondary | ICD-10-CM

## 2018-07-13 HISTORY — PX: KYPHOPLASTY: SHX5884

## 2018-07-13 LAB — PROTIME-INR
INR: 1.1
PROTHROMBIN TIME: 14.1 s (ref 11.4–15.2)

## 2018-07-13 SURGERY — KYPHOPLASTY
Anesthesia: General | Site: Spine Lumbar

## 2018-07-13 MED ORDER — FENTANYL CITRATE (PF) 250 MCG/5ML IJ SOLN
INTRAMUSCULAR | Status: AC
  Filled 2018-07-13: qty 5

## 2018-07-13 MED ORDER — ACETAMINOPHEN 500 MG PO TABS
ORAL_TABLET | ORAL | Status: AC
  Filled 2018-07-13: qty 2

## 2018-07-13 MED ORDER — SODIUM CHLORIDE 0.9% FLUSH
3.0000 mL | Freq: Two times a day (BID) | INTRAVENOUS | Status: DC
  Administered 2018-07-13: 3 mL via INTRAVENOUS

## 2018-07-13 MED ORDER — DEXAMETHASONE SODIUM PHOSPHATE 10 MG/ML IJ SOLN
INTRAMUSCULAR | Status: DC | PRN
  Administered 2018-07-13: 5 mg via INTRAVENOUS

## 2018-07-13 MED ORDER — IOPAMIDOL (ISOVUE-300) INJECTION 61%
INTRAVENOUS | Status: DC | PRN
  Administered 2018-07-13: 50 mL

## 2018-07-13 MED ORDER — ONDANSETRON HCL 4 MG/2ML IJ SOLN
INTRAMUSCULAR | Status: AC
  Filled 2018-07-13: qty 2

## 2018-07-13 MED ORDER — ACETAMINOPHEN 10 MG/ML IV SOLN
1000.0000 mg | INTRAVENOUS | Status: AC
  Administered 2018-07-13: 1000 mg via INTRAVENOUS
  Filled 2018-07-13 (×2): qty 100

## 2018-07-13 MED ORDER — ONDANSETRON HCL 4 MG PO TABS: 4.0000 mg | ORAL_TABLET | Freq: Four times a day (QID) | ORAL | Status: DC | PRN

## 2018-07-13 MED ORDER — MAGNESIUM CITRATE PO SOLN: 1.0000 | Freq: Once | ORAL | Status: DC | PRN

## 2018-07-13 MED ORDER — ACETAMINOPHEN 325 MG PO TABS
650.0000 mg | ORAL_TABLET | ORAL | Status: DC | PRN
  Administered 2018-07-13: 650 mg via ORAL

## 2018-07-13 MED ORDER — 0.9 % SODIUM CHLORIDE (POUR BTL) OPTIME
TOPICAL | Status: DC | PRN
  Administered 2018-07-13: 1000 mL

## 2018-07-13 MED ORDER — CEFAZOLIN SODIUM-DEXTROSE 2-4 GM/100ML-% IV SOLN
INTRAVENOUS | Status: AC
  Filled 2018-07-13: qty 100

## 2018-07-13 MED ORDER — METOPROLOL SUCCINATE ER 25 MG PO TB24
25.0000 mg | ORAL_TABLET | Freq: Every day | ORAL | Status: DC
Start: 2018-07-13 — End: 2018-07-13
  Administered 2018-07-13: 25 mg via ORAL
  Filled 2018-07-13: qty 1

## 2018-07-13 MED ORDER — IOPAMIDOL (ISOVUE-300) INJECTION 61%
INTRAVENOUS | Status: AC
  Filled 2018-07-13: qty 50

## 2018-07-13 MED ORDER — DEXAMETHASONE SODIUM PHOSPHATE 10 MG/ML IJ SOLN
INTRAMUSCULAR | Status: AC
  Filled 2018-07-13: qty 1

## 2018-07-13 MED ORDER — CEFAZOLIN SODIUM-DEXTROSE 2-4 GM/100ML-% IV SOLN
2.0000 g | INTRAVENOUS | Status: AC
  Administered 2018-07-13: 2 g via INTRAVENOUS

## 2018-07-13 MED ORDER — PROPOFOL 500 MG/50ML IV EMUL
INTRAVENOUS | Status: DC | PRN
  Administered 2018-07-13: 50 ug/kg/min via INTRAVENOUS

## 2018-07-13 MED ORDER — ONDANSETRON HCL 4 MG/2ML IJ SOLN: 4.0000 mg | Freq: Four times a day (QID) | INTRAMUSCULAR | Status: DC | PRN

## 2018-07-13 MED ORDER — BUPIVACAINE-EPINEPHRINE (PF) 0.25% -1:200000 IJ SOLN
INTRAMUSCULAR | Status: AC
  Filled 2018-07-13: qty 30

## 2018-07-13 MED ORDER — PROPOFOL 10 MG/ML IV BOLUS
INTRAVENOUS | Status: AC
  Filled 2018-07-13: qty 20

## 2018-07-13 MED ORDER — LEVOTHYROXINE SODIUM 100 MCG PO TABS: 50.0000 ug | ORAL_TABLET | Freq: Every day | ORAL | Status: DC

## 2018-07-13 MED ORDER — PHENOL 1.4 % MT LIQD: 1.0000 | OROMUCOSAL | Status: DC | PRN

## 2018-07-13 MED ORDER — BUPIVACAINE-EPINEPHRINE 0.25% -1:200000 IJ SOLN
INTRAMUSCULAR | Status: DC | PRN
  Administered 2018-07-13: 30 mL

## 2018-07-13 MED ORDER — ACETAMINOPHEN 650 MG RE SUPP: 650.0000 mg | RECTAL | Status: DC | PRN

## 2018-07-13 MED ORDER — FENTANYL CITRATE (PF) 250 MCG/5ML IJ SOLN
INTRAMUSCULAR | Status: DC | PRN
  Administered 2018-07-13 (×3): 25 ug via INTRAVENOUS
  Administered 2018-07-13: 50 ug via INTRAVENOUS
  Administered 2018-07-13: 25 ug via INTRAVENOUS

## 2018-07-13 MED ORDER — SUGAMMADEX SODIUM 200 MG/2ML IV SOLN
INTRAVENOUS | Status: AC
  Filled 2018-07-13: qty 2

## 2018-07-13 MED ORDER — SODIUM CHLORIDE 0.9% FLUSH: 3.0000 mL | INTRAVENOUS | Status: DC | PRN

## 2018-07-13 MED ORDER — CEFAZOLIN SODIUM-DEXTROSE 2-4 GM/100ML-% IV SOLN
2.0000 g | Freq: Three times a day (TID) | INTRAVENOUS | Status: AC
  Administered 2018-07-13 (×2): 2 g via INTRAVENOUS
  Filled 2018-07-13 (×2): qty 100

## 2018-07-13 MED ORDER — LIDOCAINE 2% (20 MG/ML) 5 ML SYRINGE
INTRAMUSCULAR | Status: DC | PRN
  Administered 2018-07-13: 60 mg via INTRAVENOUS

## 2018-07-13 MED ORDER — ACETAMINOPHEN 500 MG PO TABS
500.0000 mg | ORAL_TABLET | Freq: Four times a day (QID) | ORAL | Status: DC | PRN
  Administered 2018-07-14: 1000 mg via ORAL
  Filled 2018-07-13: qty 2

## 2018-07-13 MED ORDER — APIXABAN 5 MG PO TABS: 5.0000 mg | ORAL_TABLET | Freq: Two times a day (BID) | ORAL | 1 refills | Status: DC

## 2018-07-13 MED ORDER — ROCURONIUM BROMIDE 10 MG/ML (PF) SYRINGE
PREFILLED_SYRINGE | INTRAVENOUS | Status: AC
  Filled 2018-07-13: qty 10

## 2018-07-13 MED ORDER — LACTATED RINGERS IV SOLN
INTRAVENOUS | Status: DC
  Administered 2018-07-13: 17:00:00 via INTRAVENOUS

## 2018-07-13 MED ORDER — POLYETHYLENE GLYCOL 3350 17 G PO PACK: 17.0000 g | PACK | Freq: Every day | ORAL | Status: DC | PRN

## 2018-07-13 MED ORDER — ACETAMINOPHEN 325 MG PO TABS: 650.0000 mg | ORAL_TABLET | Freq: Four times a day (QID) | ORAL | 0 refills | Status: AC | PRN

## 2018-07-13 MED ORDER — METOPROLOL SUCCINATE ER 25 MG PO TB24: 25.0000 mg | ORAL_TABLET | Freq: Every day | ORAL | Status: DC

## 2018-07-13 MED ORDER — SODIUM CHLORIDE 0.9 % IV SOLN: 250.0000 mL | INTRAVENOUS | Status: DC

## 2018-07-13 MED ORDER — LIDOCAINE 2% (20 MG/ML) 5 ML SYRINGE
INTRAMUSCULAR | Status: AC
  Filled 2018-07-13: qty 5

## 2018-07-13 MED ORDER — DOCUSATE SODIUM 100 MG PO CAPS: 100.0000 mg | ORAL_CAPSULE | Freq: Two times a day (BID) | ORAL | Status: DC

## 2018-07-13 MED ORDER — MENTHOL 3 MG MT LOZG
1.0000 | LOZENGE | OROMUCOSAL | Status: DC | PRN
Start: 2018-07-13 — End: 2018-07-14

## 2018-07-13 MED ORDER — GABAPENTIN 100 MG PO CAPS
100.0000 mg | ORAL_CAPSULE | Freq: Three times a day (TID) | ORAL | Status: DC
  Administered 2018-07-13: 100 mg via ORAL
  Filled 2018-07-13: qty 1

## 2018-07-13 MED ORDER — ATORVASTATIN CALCIUM 20 MG PO TABS: 20.0000 mg | ORAL_TABLET | Freq: Every day | ORAL | Status: DC

## 2018-07-13 MED ORDER — BUPIVACAINE LIPOSOME 1.3 % IJ SUSP
20.0000 mL | INTRAMUSCULAR | Status: AC
  Administered 2018-07-13: 20 mL
  Filled 2018-07-13: qty 20

## 2018-07-13 MED ORDER — DAPSONE 25 MG PO TABS
50.0000 mg | ORAL_TABLET | Freq: Every day | ORAL | Status: DC
  Filled 2018-07-13 (×2): qty 2

## 2018-07-13 MED ORDER — LACTATED RINGERS IV SOLN
INTRAVENOUS | Status: DC
  Administered 2018-07-13: 10:00:00 via INTRAVENOUS

## 2018-07-13 SURGICAL SUPPLY — 44 items
ADH SKN CLS APL DERMABOND .7 (GAUZE/BANDAGES/DRESSINGS) ×1
BANDAGE ADH SHEER 1  50/CT (GAUZE/BANDAGES/DRESSINGS) ×4 IMPLANT
BLADE SURG 15 STRL LF DISP TIS (BLADE) ×1 IMPLANT
BLADE SURG 15 STRL SS (BLADE) ×3
BONE FILLER DEVICE STRL SZ3 (INSTRUMENTS) IMPLANT
CEMENT KYPHON CX01A KIT/MIXER (Cement) ×5 IMPLANT
COVER LIGHT HANDLE  DEROYL (MISCELLANEOUS) ×3 IMPLANT
CURETTE WEDGE 8.5MM KYPHX (MISCELLANEOUS) IMPLANT
DERMABOND ADVANCED (GAUZE/BANDAGES/DRESSINGS) ×2
DERMABOND ADVANCED .7 DNX12 (GAUZE/BANDAGES/DRESSINGS) ×1 IMPLANT
DRAPE C-ARM 42X72 X-RAY (DRAPES) ×6 IMPLANT
DRAPE INCISE IOBAN 66X45 STRL (DRAPES) ×3 IMPLANT
DRAPE LAPAROTOMY T 102X78X121 (DRAPES) ×3 IMPLANT
DRAPE SURG 17X23 STRL (DRAPES) ×3 IMPLANT
DRAPE U-SHAPE 47X51 STRL (DRAPES) ×3 IMPLANT
DRAPE WARM FLUID 44X44 (DRAPE) ×3 IMPLANT
DURAPREP 26ML APPLICATOR (WOUND CARE) ×3 IMPLANT
GLOVE BIO SURGEON STRL SZ 6.5 (GLOVE) ×2 IMPLANT
GLOVE BIO SURGEONS STRL SZ 6.5 (GLOVE) ×1
GLOVE BIOGEL PI IND STRL 6.5 (GLOVE) ×1 IMPLANT
GLOVE BIOGEL PI IND STRL 8.5 (GLOVE) ×1 IMPLANT
GLOVE BIOGEL PI INDICATOR 6.5 (GLOVE) ×2
GLOVE BIOGEL PI INDICATOR 8.5 (GLOVE) ×2
GLOVE SS BIOGEL STRL SZ 8.5 (GLOVE) ×1 IMPLANT
GLOVE SUPERSENSE BIOGEL SZ 8.5 (GLOVE) ×2
GOWN STRL REUS W/ TWL LRG LVL3 (GOWN DISPOSABLE) ×2 IMPLANT
GOWN STRL REUS W/TWL 2XL LVL3 (GOWN DISPOSABLE) ×3 IMPLANT
GOWN STRL REUS W/TWL LRG LVL3 (GOWN DISPOSABLE) ×6
KIT BASIN OR (CUSTOM PROCEDURE TRAY) ×3 IMPLANT
KIT TURNOVER KIT B (KITS) ×3 IMPLANT
NDL SPNL 18GX3.5 QUINCKE PK (NEEDLE) ×2 IMPLANT
NEEDLE 21X1 OR PACK (NEEDLE) ×3 IMPLANT
NEEDLE SPNL 18GX3.5 QUINCKE PK (NEEDLE) ×6 IMPLANT
NS IRRIG 1000ML POUR BTL (IV SOLUTION) ×3 IMPLANT
PACK SURGICAL SETUP 50X90 (CUSTOM PROCEDURE TRAY) ×3 IMPLANT
PACK UNIVERSAL I (CUSTOM PROCEDURE TRAY) ×3 IMPLANT
PAD ARMBOARD 7.5X6 YLW CONV (MISCELLANEOUS) ×6 IMPLANT
SPONGE LAP 4X18 RFD (DISPOSABLE) ×3 IMPLANT
SUT MNCRL AB 3-0 PS2 18 (SUTURE) ×3 IMPLANT
SYR CONTROL 10ML LL (SYRINGE) ×3 IMPLANT
TOWEL OR 17X26 10 PK STRL BLUE (TOWEL DISPOSABLE) ×3 IMPLANT
TRAY KYPHOPAK 15/3 ONESTEP 1ST (MISCELLANEOUS) ×2 IMPLANT
TRAY KYPHOPAK 20/3 ONESTEP 1ST (MISCELLANEOUS) ×2 IMPLANT
WATER STERILE IRR 1000ML POUR (IV SOLUTION) ×3 IMPLANT

## 2018-07-13 NOTE — Op Note (Signed)
Operative report  Preoperative diagnosis: Multilevel lumbar osteoporotic compression fractures.  L2, L3, and L5 (acute to subacute)  Postoperative diagnosis: Same  Operative procedure: Kyphoplasty L2, L3, and L5  First assistant: Ronette Deter, PA  Complications: None  Indications: This is a very pleasant 84 year old gentleman who had acute onset of severe back pain several weeks ago.  Imaging studies including an MRI demonstrated chronic multiple osteoporotic compression fractures with acute to subacute fractures of L2, L3, and L5.  There is still edema in the marrow consistent with acute to subacute injury.  Because of the failure of conservative management to aid in his overall pain relief we elected to proceed with a kyphoplasty to alleviate his pain.  All appropriate risks benefits and alternatives were discussed with the patient and his son and consent was obtained.  Operative note.  Patient was brought the operating room.  After IV sedation was provided he was turned prone onto the flat [place] table.  The arms were tucked at the side and his head was made to be comfortable.  Patient confirmed that he was comfortable.  The bar spine was prepped and draped in standard fashion.  A timeout was taken to confirm patient procedure and all other important data.  Once this was done 2 fluoroscopic x-ray devices were then sterilized and brought into the field 1 into the AP the other in the lateral plane.  Identified the L5 pedicle and marked the skin.  The skin was then infiltrated with a combination of quarter percent Marcaine with epinephrine and Exparel.  This provided excellent local anesthesia.  A small stab incision was made and the Jamshidi needle was advanced percutaneously to the lateral border of the pedicle is seen on the AP view.  Once I confirmed trajectory and position I advanced the Jamshidi needle into the L5 pedicle.  I confirmed throughout this process using both the AP and lateral imaging  to confirm position and trajectory.  As I was nearing the medial wall of the pedicle is seen on the AP view I confirmed that I was beyond the posterior wall of the vertebral body on the lateral.  Once the trocar was at the appropriate depth I repeated this exact same procedure on the contralateral side.  I then placed the drill and then sounded the hole to ensure had a solid bony canal.  I then placed the inflatable bone tamp and then inflated each side.  The cement was then prepped in the back table and then inserted into the defect and it created.  I got excellent fill bilaterally at L5.  There was no evidence of leak of cement posteriorly anteriorly inferiorly or superiorly or laterally.  Once the cement was allowed to cure I then remove the Jamshidi needles and then repositioned at the L3 vertebral body.  Using the exact same technique I cannulated the L3 pedicle bilaterally.  I again drilled and sounded the canal to ensure that I had a solid bony hole.  The inflatable bone tamps were inserted inflated and this was followed by the cement.  Again there was no evidence of leak of the cement.  Once the cement was allowed to cure I remove the trochars and repeated this exact same procedure at L2.  The final cement mantle at each of the 3 levels were satisfactory in both AP and lateral planes.  At this point the skin surfaces were cleaned and each of the stab incisions were closed with an interrupted 3-0 Monocryl simple stitch.  Band-Aids were then applied.  The patient was then returned to the PACU without incident.  Throughout the case the patient was noted to be neurologically intact.  He was able to move both feet on command.  At the end of the case all needle sponge counts were correct.  There were no adverse intraoperative events.

## 2018-07-13 NOTE — Discharge Instructions (Signed)
Balloon Kyphoplasty Balloon kyphoplasty is a procedure to treat a spinal compression fracture, which is a collapse of the bones that form the spine (vertebrae). With this type of fracture, the vertebrae become squashed (compressed) into a wedge shape, and this causes pain. In this procedure, the collapsed vertebrae are expanded with a balloon, and bone cement is injected into them to strengthen them. Tell a health care provider about:  Any allergies you have.  All medicines you are taking, including vitamins, herbs, eye drops, creams, and over-the-counter medicines.  Any problems you or family members have had with anesthetic medicines.  Any blood disorders you have.  Any surgeries you have had.  Any medical conditions you have.  Whether you are pregnant or may be pregnant. What are the risks? Generally, this is a safe procedure. However, problems may occur, including:  Infection.  Bleeding.  Allergic reactions to medicines.  Damage to other structures or organs.  Leaking of bone cement into other parts of the body.  What happens before the procedure?  Follow instructions from your health care provider about eating or drinking restrictions.  Ask your health care provider about: ? Changing or stopping your regular medicines. This is especially important if you are taking diabetes medicines or blood thinners. ? Taking medicines such as aspirin and ibuprofen. These medicines can thin your blood. Do not take these medicines before your procedure if your health care provider instructs you not to.  Ask your health care provider how your surgical site will be marked or identified.  You may be given antibiotic medicine to help prevent infection.  Do not use tobacco products, including cigarettes, chewing tobacco, or e-cigarettes. If you need help quitting, ask your health care provider.  Plan to have someone take you home after the procedure.  If you go home right after the  procedure, plan to have someone with you for 24 hours. What happens during the procedure?  To reduce your risk of infection: ? Your health care team will wash or sanitize their hands. ? Your skin will be washed with soap.  An IV tube will be inserted into one of your veins.  You will be given one or more of the following: ? A medicine to help you relax (sedative). ? A medicine to numb the area (local anesthetic). ? A medicine to make you fall asleep (general anesthetic).  Your surgeon will use an X-ray machine to see your spinal compression fracture.  Two small incisions will be made near your spine.  A thin tube will be inserted into your spine. Through this tube, the balloon will be placed in your spine where the fractures are.  The balloon will be inflated. This will create space and push the bone back toward its normal height and shape.  The balloon will be removed.  The newly created space in your spine will be filled with bone cement.  When the cement hardens, the tube in your spine will be removed.  Your incisions will be closed with stitches (sutures), skin glue, or adhesive strips.  A bandage (dressing) may be used to cover your incisions. The procedure may vary among health care providers and hospitals. What happens after the procedure?  Your blood pressure, heart rate, breathing rate, and blood oxygen level will be monitored often until the medicines you were given have worn off.  You will have some pain. Pain medicine will be available to help you. This information is not intended to replace advice given to you  by your health care provider. Make sure you discuss any questions you have with your health care provider. Document Released: 11/11/2004 Document Revised: 05/13/2016 Document Reviewed: 03/31/2015 Elsevier Interactive Patient Education  [employer].

## 2018-07-13 NOTE — Transfer of Care (Signed)
Immediate Anesthesia Transfer of Care Note  Patient: [name]  Procedure(s) Performed: KYPHOPLASTY L2, L3, L5 (N/A Spine Lumbar)  Patient Location: PACU  Anesthesia Type:MAC  Level of Consciousness: awake, alert , oriented and patient cooperative  Airway & Oxygen Therapy: Patient Spontanous Breathing and Patient connected to nasal cannula oxygen  Post-op Assessment: Report given to RN, Post -op Vital signs reviewed and stable and Patient moving all extremities  Post vital signs: Reviewed and stable  Last Vitals:  Vitals Value Taken Time  BP 110/72 07/13/2018 12:27 PM  Temp    Pulse 78 07/13/2018 12:34 PM  Resp 19 07/13/2018 12:34 PM  SpO2 100 % 07/13/2018 12:34 PM  Vitals shown include unvalidated device data.  Last Pain:  Vitals:   07/13/18 1228  TempSrc:   PainSc: (P) 0-No pain      Patients Stated Pain Goal: 0 (31/28/11 8867)  Complications: No apparent anesthesia complications

## 2018-07-13 NOTE — Brief Op Note (Signed)
07/13/2018  12:19 PM  PATIENT:  Steward Drone  84 y.o. male  PRE-OPERATIVE DIAGNOSIS:  Lumbar compression fractures  POST-OPERATIVE DIAGNOSIS:  Lumbar compression fractures  PROCEDURE:  Procedure(s) with comments: KYPHOPLASTY L2, L3, L5 (N/A) - 120 mins  SURGEON:  Surgeon(s) and Role:    Melina Schools, MD - Primary  PHYSICIAN ASSISTANT:   ASSISTANTS: Carmen Mayo   ANESTHESIA:   IV sedation  EBL:  minimal   BLOOD ADMINISTERED:none  DRAINS: none   LOCAL MEDICATIONS USED:  MARCAINE    and OTHER exparel  SPECIMEN:  No Specimen  DISPOSITION OF SPECIMEN:  N/A  COUNTS:  YES  TOURNIQUET:  * No tourniquets in log *  DICTATION: .Dragon Dictation  PLAN OF CARE: Admit for overnight observation  PATIENT DISPOSITION:  PACU - hemodynamically stable.

## 2018-07-13 NOTE — Anesthesia Preprocedure Evaluation (Addendum)
Anesthesia Evaluation  Patient identified by MRN, date of birth, ID band Patient awake    Reviewed: Allergy & Precautions, NPO status , Patient's Chart, lab work & pertinent test results  Airway Mallampati: I       Dental  (+) Edentulous Upper, Edentulous Lower   Pulmonary    Pulmonary exam normal breath sounds clear to auscultation       Cardiovascular + dysrhythmias Atrial Fibrillation  Rhythm:Irregular Rate:Normal     Neuro/Psych negative neurological ROS     GI/Hepatic negative GI ROS, Neg liver ROS,   Endo/Other  Hypothyroidism   Renal/GU negative Renal ROS     Musculoskeletal   Abdominal Normal abdominal exam  (+)   Peds  Hematology   Anesthesia Other Findings Jacinta Shoe, PA-C Physician Assistant Anesthesiology Progress Notes Signed Date of Service:  07/11/2018 11:59 PM    Related encounter: Pre-Admission Testing 60 from 07/11/2018 in [facility] PREADMISS[facility] TEST[facility]  Signed     Anesthesia Chart Review:   Case:  [id number] Date/Time:  07/13/18 1013  Procedure:  KYPHOPLASTY L2, L3, L5 (N/A ) - 120 mins  Anesthesia type:  General  Pre-op diagnosis:  Lumbar compression fractures  Location:  [facility] ROOM 04 / [facility]  Surgeon:  Melina Schools, MD    DISCUSSION: Patient is an 84 year old male scheduled for the above procedure.  History includes never smoker, hypercholesterolemia, celiac disease, ETOH abuse (reported heavy drinking in the past, now "occasional"), prostate cancer (s/p radioactive seed implant '98), CVA '87, macrocytic anemia, afib.   VS: BP 127/74   Pulse 78   Temp 36.6 C   Resp 20   Ht _0  (1.854 m)   Wt 153 lb (69.4 kg)   SpO2 95%   BMI 20.19 kg/m   PROVIDERS: [name], MD is PCP Kirk Ruths, MD is cardiologist. He last saw Dr. Stanford Breed on 09/12/17. He reviewed 04/2017 echo that showed EF 45-50% with diffuse hypokinesis, moderate MR,  severe TR and severe pulmonary hypertension (70 mmHg). He felt LVF appeared normal. If regards to other findings, he was planning to manage conservatively given patient's age and co-morbidities. Continue Lasix PRN with repeat echo in ~ 6 months planned. He was last seen by cardiology on 07/06/18 with Kerin Ransom, PA-C for follow-up and preoperative evaluation. He wrote, "...based on ACC/AHA guidelines, [name] be at acceptable risk for the planned procedure without further cardiovascular testing. Hold [phone] hours pre op per pharmacy." Last dose 07/10/18.  Reviewed above with anesthesiologist Lillia Abed, MD. He wanted me to clarify that patient would not need echo prior to surgery since repeat echo ~ 02/2018 previously mentioned when he saw Dr. Stanford Breed on 09/12/17. Unfortunately, Dr. Stanford Breed is out of the office today, but I was able to speak with Kerin Ransom, PA-C. Based on his recent evaluation of Mr. Sine, he still does not think patient requires an echo prior to surgery, but that if anesthesiologist wanted to clarify then Dr. Stanford Breed would be available on 07/13/18 (see 07/12/18 telephone encounter by Kerin Ransom, PA-C). I discussed this with Dr. Conrad [place] and our Holding RN floor coordinator. Patient's assigned anesthesiologist to be notified once patient arrives. Anesthesiologist can evaluate Mr. Mcneese and review available records. If still with questions anesthesiologist can contact Dr. Stanford Breed or covering physician to further discuss. I have updated Judeen Hammans at Dr. Rolena Infante' office.    LABS: Labs reviewed: Acceptable for surgery. He is for PT/INR on the morning of surgery.  (  all labs ordered are listed, but only abnormal results are displayed)   Labs Reviewed CBC - Abnormal; Notable for the following components:     Result Value   RBC 3.25 (*)   Hemoglobin 11.1 (*)   HCT 34.5 (*)   MCV 106.2 (*)   MCH 34.2 (*)   All other components within normal  limits COMPREHENSIVE METABOLIC PANEL - Abnormal; Notable for the following components:  Sodium 132 (*)   All other components within normal limits    IMAGES: MRI L-spine 06/27/18: IMPRESSION: 1. L2, L3, and L5 compression deformities with edema indicating recent injury. 2. Stable moderate T11 compression deformity. 3. L1-S1 intervertebral disc edema and mild edema within the paravertebral soft tissues through the levels of compression deformities, likely related to fractures. Underlying infection is unlikely.   EKG: EKG 07/06/18: Afib at 91 bpm, LAD. Septal infarct (age undetermined).    CV: Echo 04/22/17: Study Conclusions - Left ventricle: The cavity size was normal. Systolic function was mildly reduced. The estimated ejection fraction was in the range of 45% to 50%. Diffuse hypokinesis. - Aortic valve: Valve mobility was restricted. Mean gradient (S): 5 mm Hg. Peak gradient (S): 8 mm Hg. - Aortic root: The aortic root wasmildly dilated measuring 40 mm. - Mitral valve: Calcified annulus. Mildly thickened leaflets . There was moderate regurgitation. - Left atrium: The atrium was moderately dilated. - Right ventricle: Systolic function was normal. - Right atrium: The atrium was severely dilated. - Tricuspid valve: There was severe regurgitation. - Pulmonary arteries: Systolic pressure was severely increased. PA peak pressure: 70 mm Hg (S). - Inferior vena cava: The vessel was dilated. The respirophasic diameter changes were blunted (<50%), consistent with elevated central venous pressure. - Pericardium, extracardiac: There was no pericardial effusion. Impressions: - Mildly decreased LVEF 45-50% with diffuse hypokinesis. Moderate mitral regurgitation (PISA 0.5). Severe tricuspid regurgitation, severe pulmonary hypertension. (Per 09/12/17 office note by Dr. Stanford Breed, repeat echo in six months planned. Comparison echo 01/21/16: EF 55-60%, mod-severe  MR, mod TR, PA peak pressure 39 mmHg.)  He denied prior stress test or cardiac cath. By cardiology notes, he has no known history of CAD or MI.    Past Medical History: Diagnosis Date . Alcohol abuse  . Allergic dermatitis  . Atrial fibrillation ([place])  . Celiac disease  . Constipation  . CVA (cerebral infarction) 1987  blood clot, right side and speech issues, recovered quickly . DJD (degenerative joint disease)  . Macrocytic anemia  . Nonspecific (abnormal) findings on radiological and other examination of other intrathoracic organs  . Nonspecific abnormal results of thyroid function study  . Olecranon bursitis  . Prostate cancer ([place])  . Pure hypercholesterolemia  . Unspecified hemorrhoids without mention of complication     Past Surgical History: Procedure Laterality Date . COLONOSCOPY   . prostate biopsy and see implantation   . WRIST FRACTURE SURGERY     MEDICATIONS:  . atorvastatin (LIPITOR) 20 MG tablet . cyanocobalamin 1000 MCG tablet . dapsone 100 MG tablet . ELIQUIS 5 MG TABS tablet . gabapentin (NEURONTIN) 100 MG capsule . levothyroxine (SYNTHROID, LEVOTHROID) 50 MCG tablet . metoprolol succinate (TOPROL-XL) 25 MG 24 hr tablet   No current facility-administered medications for this encounter.    George Hugh [facility] Short Stay Center/Anesthesiology Phone [phone] 07/12/2018 5:04 PM          Reproductive/Obstetrics  Anesthesia Physical Anesthesia Plan  ASA: III  Anesthesia Plan: MAC   Post-op Pain Management:    Induction:   PONV Risk Score and Plan: 3 and Ondansetron and Dexamethasone  Airway Management Planned: Natural Airway and Nasal Cannula  Additional Equipment: None  Intra-op Plan:   Post-operative Plan:   Informed Consent: I have reviewed the patients History and Physical, chart, labs and discussed the procedure including  the risks, benefits and alternatives for the proposed anesthesia with the patient or authorized representative who has indicated his/her understanding and acceptance.   Dental advisory given  Plan Discussed with: CRNA and Surgeon  Anesthesia Plan Comments:        Anesthesia Quick Evaluation

## 2018-07-13 NOTE — Anesthesia Postprocedure Evaluation (Signed)
Anesthesia Post Note  Patient: [name]  Procedure(s) Performed: KYPHOPLASTY L2, L3, L5 (N/A Spine Lumbar)     Patient location during evaluation: PACU Anesthesia Type: MAC Level of consciousness: awake Pain management: pain level controlled Vital Signs Assessment: post-procedure vital signs reviewed and stable Respiratory status: spontaneous breathing and aerosol facemask Cardiovascular status: stable Postop Assessment: no apparent nausea or vomiting Anesthetic complications: no    Last Vitals:  Vitals:   07/13/18 1405 07/13/18 1435  BP: (!) 149/79 (!) 142/86  Pulse: 67 70  Resp: 19 20  Temp:    SpO2: 96% 98%    Last Pain:  Vitals:   07/13/18 1405  TempSrc:   PainSc: 0-No pain   Pain Goal: Patients Stated Pain Goal: 2 (07/13/18 1356)               [place]

## 2018-07-14 ENCOUNTER — Encounter (HOSPITAL_COMMUNITY): Payer: Self-pay | Admitting: Orthopedic Surgery

## 2018-07-14 DIAGNOSIS — R2681 Unsteadiness on feet: Secondary | ICD-10-CM | POA: Diagnosis not present

## 2018-07-14 DIAGNOSIS — Z8546 Personal history of malignant neoplasm of prostate: Secondary | ICD-10-CM | POA: Diagnosis not present

## 2018-07-14 DIAGNOSIS — Z7901 Long term (current) use of anticoagulants: Secondary | ICD-10-CM | POA: Diagnosis not present

## 2018-07-14 DIAGNOSIS — M8008XA Age-related osteoporosis with current pathological fracture, vertebra(e), initial encounter for fracture: Secondary | ICD-10-CM | POA: Diagnosis not present

## 2018-07-14 DIAGNOSIS — I4891 Unspecified atrial fibrillation: Secondary | ICD-10-CM | POA: Diagnosis not present

## 2018-07-14 DIAGNOSIS — Z8673 Personal history of transient ischemic attack (TIA), and cerebral infarction without residual deficits: Secondary | ICD-10-CM | POA: Diagnosis not present

## 2018-07-14 NOTE — Evaluation (Signed)
Physical Therapy Evaluation Patient Details Name: [name] MRN: [id number] DOB: 12-May-1930 Today's Date: 07/14/2018   History of Present Illness  Pt is a 84 y.o. M with significant PMH of CVA who presented with chronic multiple osteoporotic fractures with acute to subacute fractures of L2, L3, and L5. Now s/p kyphoplasty.  Clinical Impression  Patient is s/p above surgery resulting in the deficits listed below (see PT Problem List). At baseline, patient lives alone, is independent with ADL's, and uses a Rollator for mobility. Does have history of several falls a couple years ago, but reports he has had no falls in the past year. Currently presenting with decreased functional mobility secondary to generalized weakness, abnormal posture, decreased gait speed, and balance deficits. Ambulating 160 feet with walker and min guard assist. Patient will benefit from skilled PT to increase their independence and safety with mobility (while adhering to their precautions) to allow discharge to the venue listed below.     Follow Up Recommendations Home health PT;Supervision for mobility/OOB    Equipment Recommendations  None recommended by PT    Recommendations for Other Services       Precautions / Restrictions Precautions Precautions: Back;Fall((back for comfort)) Precaution Booklet Issued: Yes (comment) Precaution Comments: verbally reviewed and provided written handout Restrictions Weight Bearing Restrictions: No      Mobility  Bed Mobility Overal bed mobility: Modified Independent             General bed mobility comments: Significantly increased time and effort but no physical assistance required. Patient uses hands to progress legs to edge of bed. reviewed log roll technique.  Transfers Overall transfer level: Needs assistance Equipment used: Rolling walker (2 wheeled) Transfers: Sit to/from Stand Sit to Stand: Supervision         General transfer comment: Cues for  hand placement and safety. Reviewed locking Rollator brakes at home prior to standing/sitting  Ambulation/Gait Ambulation/Gait assistance: Min guard Gait Distance (Feet): 160 Feet Assistive device: Rolling walker (2 wheeled) Gait Pattern/deviations: Step-through pattern;Trunk flexed;Decreased dorsiflexion - right;Decreased dorsiflexion - left;Decreased stride length Gait velocity: decreased   General Gait Details: patient with bilateral foot supination, knee flexion and trunk flexed throughout gait. Cues for posture and walker proximity.   Stairs            Wheelchair Mobility    Modified Rankin (Stroke Patients Only)       Balance Overall balance assessment: Needs assistance Sitting-balance support: No upper extremity supported;Feet supported Sitting balance-Leahy Scale: Good       Standing balance-Leahy Scale: Poor Standing balance comment: reliant on external support. when reaching for rail in hallway while PT was adjusting walker, pt with mild loss of balance due to reaching outside of his base of support                             Pertinent Vitals/Pain Pain Assessment: 0-10 Pain Score: 0-No pain    Home Living Family/patient expects to be discharged to:: Private residence Living Arrangements: Alone Available Help at Discharge: Family;Available PRN/intermittently(son) Type of Home: House Home Access: Stairs to enter Entrance Stairs-Rails: None Entrance Stairs-Number of Steps: 1 Home Layout: One level Home Equipment: Walker - 4 wheels;Cane - single point;Grab bars - tub/shower      Prior Function Level of Independence: Independent;Independent with assistive device(s)         Comments: Uses Rollator for mobility. Independent with ADL's/IADL's, but daughter in law does assist  with cleaning occasionally     Hand Dominance        Extremity/Trunk Assessment   Upper Extremity Assessment Upper Extremity Assessment: Defer to OT evaluation     Lower Extremity Assessment Lower Extremity Assessment: Generalized weakness    Cervical / Trunk Assessment Cervical / Trunk Assessment: Kyphotic;Other exceptions Cervical / Trunk Exceptions: s/p kyphoplasty  Communication   Communication: No difficulties  Cognition Arousal/Alertness: Awake/alert Behavior During Therapy: WFL for tasks assessed/performed Overall Cognitive Status: Within Functional Limits for tasks assessed                                 General Comments: Pleasant and cooperative but somewhat set in his ways.      General Comments General comments (skin integrity, edema, etc.): wears glasses    Exercises     Assessment/Plan    PT Assessment Patient needs continued PT services  PT Problem List Decreased strength;Decreased activity tolerance;Decreased balance;Decreased mobility;Pain       PT Treatment Interventions DME instruction;Gait training;Stair training;Functional mobility training;Therapeutic activities;Therapeutic exercise;Balance training;Patient/family education    PT Goals (Current goals can be found in the Care Plan section)  Acute Rehab PT Goals Patient Stated Goal: patient stated, "am I ready to go home?" Agreable to HHPT PT Goal Formulation: With patient Time For Goal Achievement: 07/28/18 Potential to Achieve Goals: Good    Frequency Min 5X/week   Barriers to discharge        Co-evaluation               AM-PAC PT "6 Clicks" Daily Activity  Outcome Measure Difficulty turning over in bed (including adjusting bedclothes, sheets and blankets)?: A Little Difficulty moving from lying on back to sitting on the side of the bed? : A Little Difficulty sitting down on and standing up from a chair with arms (e.g., wheelchair, bedside commode, etc,.)?: A Little Help needed moving to and from a bed to chair (including a wheelchair)?: A Little Help needed walking in hospital room?: A Little Help needed climbing 3-5 steps with  a railing? : A Lot 6 Click Score: 17    End of Session Equipment Utilized During Treatment: Gait belt Activity Tolerance: Patient tolerated treatment well Patient left: in bed;with call bell/phone within reach Nurse Communication: Mobility status PT Visit Diagnosis: Unsteadiness on feet (R26.81);Muscle weakness (generalized) (M62.81);Difficulty in walking, not elsewhere classified (R26.2)    Time: 9798-9211 PT Time Calculation (min) (ACUTE ONLY): 23 min   Charges:   PT Evaluation $PT Eval Low Complexity: 1 Low PT Treatments $Gait Training: 8-22 mins        Ellamae Sia, PT, DPT Acute Rehabilitation Services  Pager: [phone]   Willy Eddy 07/14/2018, 9:34 AM

## 2018-07-14 NOTE — Progress Notes (Signed)
Patient alert and oriented, mae's well, voiding adequate amount of urine, swallowing without difficulty, no c/o pain at time of discharge. Patient discharged home with family. Discharged instructions given to patient and son. Patient and son stated understanding of instructions given. Patient has an appointment with Dr. Rolena Infante

## 2018-07-14 NOTE — Care Management Note (Signed)
Case Management Note  Patient Details  Name: [name] MRN: [id number] Date of Birth: Jul 05, 1930    Subjective/Objective:   84 yr old gentleman s/p Kyphoplasty of L2, L3, L5, Lumbar spine.          Action/Plan: Case manager spoke with patient concerning discharge plan.Choice for Home Health agency was offered. Patient says he used [facility] in the pat for his wife and would like to do so now. CM called referral to [place], [place] Liaison. Patient's address was confirmed. He will have family support at discharge..   Expected Discharge Date:   07/14/18               Expected Discharge Plan:  [place]  In-House Referral:  NA  Discharge planning Services  CM Consult  Post Acute Care Choice:  Home Health Choice offered to:  Patient  DME Arranged:  3-N-1 DME Agency:  [place] Arranged:  PT, OT [facility] Agency:  [place]  Status of Service:  Completed, signed off  If discussed at [place] of Stay Meetings, dates discussed:    Additional Comments:  Ninfa Meeker, RN 07/14/2018, 10:33 AM

## 2018-07-14 NOTE — Evaluation (Signed)
Occupational Therapy Evaluation Patient Details Name: [name] MRN: [id number] DOB: 07-18-1930 Today's Date: 07/14/2018    History of Present Illness Pt is a 84 y.o. M with significant PMH of CVA who presented with chronic multiple osteoporotic fractures with acute to subacute fractures of L2, L3, and L5. Now s/p kyphoplasty.   Clinical Impression   Pt reports he was independent with ADL PTA. Currently pt requires min guard assist for functional mobility and min assist for LB ADL. Educated pt and son on back precautions, home safety, and ADL; they both verbalize understanding. Pt planning to d/c home alone with intermittent supervision from family. Recommending HHOT for follow up to maximize independence and safety with ADL and functional mobility upon return home. Pt would benefit from continued skilled OT to address established goals.    Follow Up Recommendations  Home health OT;Supervision - Intermittent    Equipment Recommendations  3 in 1 bedside commode    Recommendations for Other Services       Precautions / Restrictions Precautions Precautions: Back;Fall Precaution Booklet Issued: No Precaution Comments: Pt able to recall 3/3 back precautions at start of session Restrictions Weight Bearing Restrictions: No      Mobility Bed Mobility Overal bed mobility: Modified Independent             General bed mobility comments: HOB flat with use of bed rail. Increased time but good log roll technqiue  Transfers Overall transfer level: Needs assistance Equipment used: Rolling walker (2 wheeled) Transfers: Sit to/from Stand Sit to Stand: Supervision         General transfer comment: Cues for hand placement; pt wanting to pull up on RW with bil UEs. Supervision for safety with multiple sit to stands during session     Balance Overall balance assessment: Needs assistance Sitting-balance support: Feet supported;No upper extremity supported Sitting balance-Leahy  Scale: Good     Standing balance support: No upper extremity supported;During functional activity Standing balance-Leahy Scale: Fair Standing balance comment: for static standing; able to pull up underwear and pants                           ADL either performed or assessed with clinical judgement   ADL Overall ADL's : Needs assistance/impaired Eating/Feeding: Set up;Sitting   Grooming: Supervision/safety;Standing Grooming Details (indicate cue type and reason): Educated on use of cup for oral care Upper Body Bathing: Set up;Supervision/ safety;Sitting   Lower Body Bathing: Minimal assistance;Sit to/from stand   Upper Body Dressing : Set up;Supervision/safety;Sitting Upper Body Dressing Details (indicate cue type and reason): to don shirt and sweatshirt Lower Body Dressing: Minimal assistance;Sit to/from stand Lower Body Dressing Details (indicate cue type and reason): Assist to start clothing over feet, pt reports he has reacher and is able to talk through technique Toilet Transfer: Min Fish farm manager Details (indicate cue type and reason): Simulated by sit to stand from EOB with functional mobility   Toileting - Clothing Manipulation Details (indicate cue type and reason): Educated on proper technique for peri care without twisting Tub/ Shower Transfer: Min guard;Walk-in shower;Ambulation;3 in 1;Rolling walker;Grab bars Tub/Shower Transfer Details (indicate cue type and reason): Discussed tub vs walk in shower at home; agree pt should use shower and practice tub with [facility] therapist. Pt able to perform shower transfer with min guard today. Educated on use of 3 in 1 in shower as a seat; pt and son verbalize understanding Functional mobility during ADLs: Min  guard;Rolling walker General ADL Comments: Educated pt on maintaining back precautions during functional activities, keeping frequently used items at counter top height, frequent mobility throughout the day upon  return home, log roll for bed mobility     Vision         Perception     Praxis      Pertinent Vitals/Pain Pain Assessment: No/denies pain Pain Score: 0-No pain     Hand Dominance     Extremity/Trunk Assessment Upper Extremity Assessment Upper Extremity Assessment: Generalized weakness   Lower Extremity Assessment Lower Extremity Assessment: Defer to PT evaluation   Cervical / Trunk Assessment Cervical / Trunk Assessment: Kyphotic;Other exceptions Cervical / Trunk Exceptions: s/p kyphoplasty   Communication Communication Communication: HOH   Cognition Arousal/Alertness: Awake/alert Behavior During Therapy: WFL for tasks assessed/performed Overall Cognitive Status: Within Functional Limits for tasks assessed                                    General Comments      Exercises     Shoulder Instructions      Home Living Family/patient expects to be discharged to:: Private residence Living Arrangements: Alone Available Help at Discharge: Family;Available PRN/intermittently Type of Home: House Home Access: Stairs to enter [employer] of Steps: 1 Entrance Stairs-Rails: None Home Layout: One level     Bathroom Shower/Tub: Walk-in shower;Tub/shower unit   Bathroom Toilet: Handicapped height Bathroom Accessibility: Yes   Home Equipment: Walker - 4 wheels;Cane - single point;Grab bars - tub/shower;Adaptive equipment Adaptive Equipment: Reacher        Prior Functioning/Environment Level of Independence: Independent with assistive device(s)        Comments: Uses Rollator for mobility. Independent with ADL's/IADL's, but daughter in law does assist with cleaning occasionally        OT Problem List: Decreased strength;Decreased activity tolerance;Impaired balance (sitting and/or standing);Decreased knowledge of use of DME or AE;Decreased knowledge of precautions      OT Treatment/Interventions: Self-care/ADL training;Energy  conservation;DME and/or AE instruction;Therapeutic activities;Patient/family education;Balance training    OT Goals(Current goals can be found in the care plan section) Acute Rehab OT Goals Patient Stated Goal: home today OT Goal Formulation: With patient/family Time For Goal Achievement: 07/28/18 Potential to Achieve Goals: Good ADL Goals Pt Will Perform Grooming: with modified independence;standing Pt Will Perform Lower Body Bathing: with modified independence;sit to/from stand Pt Will Perform Lower Body Dressing: with modified independence;sit to/from stand Pt Will Perform Tub/Shower Transfer: with modified independence;Shower transfer;ambulating;3 in 1;rolling walker;grab bars  OT Frequency: Min 2X/week   Barriers to D/C: Decreased caregiver support  pt lives alone       Co-evaluation              AM-PAC PT "6 Clicks" Daily Activity     Outcome Measure Help from another person eating meals?: None Help from another person taking care of personal grooming?: A Little Help from another person toileting, which includes using toliet, bedpan, or urinal?: A Little Help from another person bathing (including washing, rinsing, drying)?: A Little Help from another person to put on and taking off regular upper body clothing?: A Little Help from another person to put on and taking off regular lower body clothing?: A Little 6 Click Score: 19   End of Session Equipment Utilized During Treatment: Rolling walker Nurse Communication: Mobility status;Other (comment)(equipment and f/u needs)  Activity Tolerance: Patient tolerated treatment well Patient left:  in bed;with call bell/phone within reach;with family/visitor present  OT Visit Diagnosis: Unsteadiness on feet (R26.81);History of falling (Z91.81);Muscle weakness (generalized) (M62.81)                Time: 5003-7048 OT Time Calculation (min): 27 min Charges:  OT General Charges $OT Visit: 1 Visit OT Evaluation $OT Eval Moderate  Complexity: 1 Mod OT Treatments $Self Care/Home Management : 8-22 mins  Keelin Sheridan A. Ulice Brilliant, M.S., OTR/L Acute Rehab Department: [phone]  Binnie Kand 07/14/2018, 10:07 AM

## 2018-07-14 NOTE — Progress Notes (Signed)
    Subjective: Procedure(s) (LRB): KYPHOPLASTY L2, L3, L5 (N/A) 1 Day Post-Op  Patient reports pain as 0 on 0-10 scale.  Reports none leg pain reports incisional back discomfort Positive void Negative bowel movement Positive flatus Negative chest pain or shortness of breath  Objective: Vital signs in last 24 hours: Temp:  [97.7 F (36.5 C)-98 F (36.7 C)] 97.8 F (36.6 C) (07/26 0357) Pulse Rate:  [61-85] 70 (07/26 0357) Resp:  [16-20] 18 (07/26 0357) BP: (104-149)/(67-95) 111/76 (07/26 0357) SpO2:  [95 %-100 %] 98 % (07/26 0357)  Intake/Output from previous day: 07/25 0701 - 07/26 0700 In: 1160 [P.O.:360; I.V.:800] Out: 1275 [Urine:1225; Blood:50]  Labs: Recent Labs    07/11/18 1511  WBC 5.7  RBC 3.25*  HCT 34.5*  PLT 361   Recent Labs    07/11/18 1511  NA 132*  K 4.6  CL 99  CO2 25  BUN 17  CREATININE 0.63  GLUCOSE 88  CALCIUM 9.3   Recent Labs    07/13/18 0940  INR 1.10    Physical Exam: Neurologically intact ABD soft Intact pulses distally Incision: dressing C/D/I Compartment soft There is no height or weight on file to calculate BMI.   Assessment/Plan: Patient stable  xrays n/a Continue mobilization with physical therapy Continue care  Advance diet Up with therapy  Doing well - plan on d/c to home today  Melina Schools, MD [facility] [phone]

## 2018-07-20 ENCOUNTER — Other Ambulatory Visit: Payer: Self-pay

## 2018-07-20 NOTE — Discharge Summary (Signed)
Physician Discharge Summary  Patient ID: [name] MRN: [id number] DOB/AGE: 1930/06/02 84 y.o.  Admit date: 07/13/2018 Discharge date: 07/14/18  Admission Diagnoses:  Vertebral fractures  Discharge Diagnoses:  Active Problems:   S/P kyphoplasty   Past Medical History:  Diagnosis Date  . Alcohol abuse   . Allergic dermatitis   . Atrial fibrillation ([place])   . Celiac disease   . Constipation   . CVA (cerebral infarction) 1987   blood clot, right side and speech issues, recovered quickly  . DJD (degenerative joint disease)   . Macrocytic anemia   . Nonspecific (abnormal) findings on radiological and other examination of other intrathoracic organs   . Nonspecific abnormal results of thyroid function study   . Olecranon bursitis   . Prostate cancer ([place])   . Pure hypercholesterolemia   . Unspecified hemorrhoids without mention of complication     Surgeries: Procedure(s): KYPHOPLASTY L2, L3, L5 on 07/13/2018   Consultants (if any):   Discharged Condition: Improved  Hospital Course: [name] is an 84 y.o. male who was admitted 07/13/2018 with a diagnosis of Vertebral fractures and went to the operating room on 07/13/2018 and underwent the above named procedures.  Post op day 1 pt reports no pain.  Pt ambulated with PT.  Home health was recommended.  Pt is urinating w/o difficulty.  Pt denies nausea.   He was given perioperative antibiotics:  Anti-infectives (From admission, onward)   Start     Dose/Rate Route Frequency Ordered Stop   07/13/18 1530  dapsone tablet 50 mg  Status:  Discontinued     50 mg Oral Daily 07/13/18 1527 07/14/18 1741   07/13/18 1530  ceFAZolin (ANCEF) IVPB 2g/100 mL premix     2 g 200 mL/hr over 30 Minutes Intravenous Every 8 hours 07/13/18 1527 07/13/18 2313   07/13/18 0918  ceFAZolin (ANCEF) 2-4 GM/100ML-% IVPB    Note to Pharmacy:  Debbe Bales, [place]: cabinet override      07/13/18 0918 07/13/18 1105   07/13/18 0915  ceFAZolin (ANCEF)  IVPB 2g/100 mL premix     2 g 200 mL/hr over 30 Minutes Intravenous 30 min pre-op 07/13/18 0915 07/13/18 1115    .  He was given sequential compression devices, early ambulation, and TED for DVT prophylaxis.  He benefited maximally from the hospital stay and there were no complications.    Recent vital signs:  Vitals:   07/14/18 0357 07/14/18 0749  BP: 111/76 109/62  Pulse: 70 (!) 54  Resp: 18 17  Temp: 97.8 F (36.6 C) 98.1 F (36.7 C)  SpO2: 98% 99%    Recent laboratory studies:  Lab Results  Component Value Date   HGB 11.1 (L) 07/11/2018   HGB 11.5 (L) 03/27/2018   HGB 11.4 (L) 09/12/2017   Lab Results  Component Value Date   WBC 5.7 07/11/2018   PLT 361 07/11/2018   Lab Results  Component Value Date   INR 1.10 07/13/2018   Lab Results  Component Value Date   NA 132 (L) 07/11/2018   K 4.6 07/11/2018   CL 99 07/11/2018   CO2 25 07/11/2018   BUN 17 07/11/2018   CREATININE 0.63 07/11/2018   GLUCOSE 88 07/11/2018    Discharge Medications:   Allergies as of 07/14/2018   No Known Allergies     Medication List    TAKE these medications   acetaminophen 325 MG tablet Commonly known as:  TYLENOL Take 2 tablets (650 mg total) by mouth  every 6 (six) hours as needed.   apixaban 5 MG Tabs tablet Commonly known as:  ELIQUIS Take 1 tablet (5 mg total) by mouth 2 (two) times daily. Pt may restart on saturday What changed:    how much to take  additional instructions   atorvastatin 20 MG tablet Commonly known as:  LIPITOR TAKE 1 TABLET BY MOUTH EVERY DAY   cyanocobalamin 1000 MCG tablet Take 1,000 mcg by mouth daily.   dapsone 100 MG tablet TAKE 1/2 TABLET BY MOUTH ONCE DAILY   gabapentin 100 MG capsule Commonly known as:  NEURONTIN Take 1 capsule (100 mg total) by mouth 3 (three) times daily.   levothyroxine 50 MCG tablet Commonly known as:  SYNTHROID, LEVOTHROID Take 1 tablet (50 mcg total) by mouth daily.   metoprolol succinate 25 MG 24 hr  tablet Commonly known as:  TOPROL-XL TAKE 1 TABLET BY MOUTH EVERY DAY       Diagnostic Studies: Dg Lumbar Spine 2-3 Views  Result Date: 07/13/2018 CLINICAL DATA:  Intraoperative imaging provided for kyphoplasty at L2, L3 and L5. EXAM: DG C-ARM 61-120 MIN; LUMBAR SPINE - 2-3 VIEW COMPARISON:  Lumbar MRI, 06/27/2018. FINDINGS: Provided images shows placement of kyphoplasty cement within the L2, L3 and L5 vertebra stabilizing the fractures. The cement is well positioned within the vertebral bodies. No evidence of a procedure complication. IMPRESSION: Imaging provided kyphoplasty L2, L3 and L5 Electronically Signed   By: Lajean Manes M.D.   On: 07/13/2018 13:16   Mr Lumbar Spine Wo Contrast  Result Date: 06/27/2018 CLINICAL DATA:  84 y/o M; 6 weeks of severe lower back pain after lifting an item out of a car. EXAM: MRI LUMBAR SPINE WITHOUT CONTRAST TECHNIQUE: Multiplanar, multisequence MR imaging of the lumbar spine was performed. No intravenous contrast was administered. COMPARISON:  05/26/2017 CT abdomen and pelvis. FINDINGS: Segmentation:  Standard. Alignment:  Physiologic. Vertebrae: L2, L3, and L5 compression deformities with edema indicating recent injury: 1. L2 superior endplate fracture with up to 40% central loss of vertebral body height. 2. Progression of L3 inferior endplate compression deformity with 60% loss of vertebral body height. Edema extends into posterior elements. 3. L5 superior endplate fracture with 29% loss of vertebral body height. Stable moderate T11 compression deformity with large superior endplate Schmorl's node. Mildly increased signal is present within the intervertebral disc spaces at L1 through S1. Mild edema within the paravertebral soft tissues at the levels of acute cord compression deformities, no discrete fluid collection identified on this noncontrast examination. Conus medullaris and cauda equina: Conus extends to the L2 level. Conus and cauda equina appear normal.  Paraspinal and other soft tissues: 4 cm cyst in the right kidney upper pole. Disc levels: T12-L1: Small disc bulge. No significant foraminal or canal stenosis. L1-2: Small disc bulge with mild facet hypertrophy. No significant foraminal or canal stenosis. L2-3: Small disc bulge with moderate bilateral facet hypertrophy. Mild edema in the anterior epidural space. Mild bilateral foraminal stenosis. Moderate canal stenosis. L3-4: Disc bulge with endplate marginal osteophytes and moderate facet hypertrophy. Small juxta-articular cyst posterior to the right facet in paraspinal muscles. Moderate bilateral foraminal stenosis. Mild canal stenosis. L4-5: Disc bulge with endplate marginal osteophytes and moderate bilateral facet hypertrophy. Moderate bilateral foraminal stenosis and mild canal stenosis. L5-S1: Disc bulge with endplate marginal osteophytes and moderate bilateral facet hypertrophy. Moderate bilateral foraminal stenosis. No significant canal stenosis. IMPRESSION: 1. L2, L3, and L5 compression deformities with edema indicating recent injury. 2. Stable moderate T11 compression  deformity. 3. L1-S1 intervertebral disc edema and mild edema within the paravertebral soft tissues through the levels of compression deformities, likely related to fractures. Underlying infection is unlikely. Electronically Signed   By: Kristine Garbe M.D.   On: 06/27/2018 15:55   Mr Hip Right Wo Contrast  Result Date: 06/27/2018 : Please see accession number [id number] for the reports for MRI of the right hip and MRI of the left hip. Electronically Signed   By: Van Clines M.D.   On: 06/27/2018 16:41   Mr Hip Left Wo Contrast  Result Date: 06/27/2018 CLINICAL DATA:  Back pain and bilateral hip pain. EXAM: MRI OF THE LEFT HIP WITHOUT CONTRAST MRI OF THE RIGHT HIP WITHOUT CONTRAST TECHNIQUE: Multiplanar, multisequence MR imaging was performed. No intravenous contrast was administered. COMPARISON:  CT pelvis 05/26/2017  FINDINGS: LEFT HIP Bones: Degenerative subcortical cyst formation in the left acetabular roof. Spurring observed at the ischial tuberosity along the hamstring attachment site. Left sacroiliac joint unremarkable. There is mild spurring of the acetabulum and of the left femoral head. Articular cartilage and labrum Articular cartilage:  Moderate degenerative chondral thinning. Labrum: Severely degenerated and irregular left acetabular labrum, with anterior scalloped irregularity along the labrum on image 14/4, appearance favoring degenerative labral tear involving the anterior and superior labrum. Joint or bursal effusion Joint effusion:  Absent Bursae:  Mild iliopsoas bursitis. Muscles and tendons Muscles and tendons: Proximal hamstring tendinopathy and mild partial tearing as shown on images 1 through 8 of series 8. Other findings Miscellaneous: Small bladder diverticula. Degenerative disc disease and spondylosis at L5-S1. RIGHT HIP Bones: Mild subcortical marrow edema along the acetabular roof, although less striking than on the contralateral side. Mild spurring along the right acetabulum and right femoral head. Articular cartilage and labrum Articular cartilage:  Mild degenerative chondral thinning. Labrum: Accentuated proton density weighted compatible signal in the anterior superior labrum with degeneration but without a well-defined tear. Joint or bursal effusion Joint effusion:  Absent Bursae: Minimal iliopsoas bursitis. Muscles and tendons Muscles and tendons:  Unremarkable IMPRESSION: 1. On the left side, there is suspected degenerative tearing of the superior labrum along with moderate findings of osteoarthritis of the hip and proximal hamstring tendinopathy and mild partial tearing of the hamstring. 2. On the right side, there is mild chondral thinning and mild spurring without a definite acetabular labral tear. 3. Bilateral mild iliopsoas bursitis. 4. Degenerative disc disease at L5-S1. Electronically  Signed   By: Van Clines M.D.   On: 06/27/2018 16:12   Dg C-arm 1-60 Min  Result Date: 07/13/2018 CLINICAL DATA:  Intraoperative imaging provided for kyphoplasty at L2, L3 and L5. EXAM: DG C-ARM 61-120 MIN; LUMBAR SPINE - 2-3 VIEW COMPARISON:  Lumbar MRI, 06/27/2018. FINDINGS: Provided images shows placement of kyphoplasty cement within the L2, L3 and L5 vertebra stabilizing the fractures. The cement is well positioned within the vertebral bodies. No evidence of a procedure complication. IMPRESSION: Imaging provided kyphoplasty L2, L3 and L5 Electronically Signed   By: Lajean Manes M.D.   On: 07/13/2018 13:16    Disposition:   Post op meds provided Pt will present to clinic in 2 weeks  Discharge Instructions    Incentive spirometry RT   Complete by:  As directed       Follow-up Information    Melina Schools, MD Follow up in 2 week(s).   Specialty:  Orthopedic Surgery Contact information: [address] [place] [address] [phone]  [facility], [facility] Follow up.   Specialty:  [place] Why:  A representative from [facility] will contact you to arrange start date and time for your therapy. Contact information: [address] [place] [address] [phone]            Signed: Valinda Hoar 07/20/2018, 4:39 PM

## 2018-08-25 DIAGNOSIS — M8008XD Age-related osteoporosis with current pathological fracture, vertebra(e), subsequent encounter for fracture with routine healing: Secondary | ICD-10-CM | POA: Diagnosis not present

## 2018-08-25 DIAGNOSIS — Z4789 Encounter for other orthopedic aftercare: Secondary | ICD-10-CM | POA: Diagnosis not present

## 2018-08-29 DIAGNOSIS — M545 Low back pain: Secondary | ICD-10-CM | POA: Diagnosis not present

## 2018-08-31 DIAGNOSIS — M545 Low back pain: Secondary | ICD-10-CM | POA: Diagnosis not present

## 2018-09-05 DIAGNOSIS — M545 Low back pain: Secondary | ICD-10-CM | POA: Diagnosis not present

## 2018-09-07 DIAGNOSIS — M545 Low back pain: Secondary | ICD-10-CM | POA: Diagnosis not present

## 2018-09-12 DIAGNOSIS — M545 Low back pain: Secondary | ICD-10-CM | POA: Diagnosis not present

## 2018-09-14 DIAGNOSIS — M545 Low back pain: Secondary | ICD-10-CM | POA: Diagnosis not present

## 2018-09-19 DIAGNOSIS — M545 Low back pain: Secondary | ICD-10-CM | POA: Diagnosis not present

## 2018-09-21 DIAGNOSIS — M545 Low back pain: Secondary | ICD-10-CM | POA: Diagnosis not present

## 2018-09-22 DIAGNOSIS — Z9889 Other specified postprocedural states: Secondary | ICD-10-CM | POA: Diagnosis not present

## 2018-09-22 DIAGNOSIS — M545 Low back pain: Secondary | ICD-10-CM | POA: Diagnosis not present

## 2018-09-25 DIAGNOSIS — M545 Low back pain: Secondary | ICD-10-CM | POA: Diagnosis not present

## 2018-09-28 DIAGNOSIS — M545 Low back pain: Secondary | ICD-10-CM | POA: Diagnosis not present

## 2018-10-02 DIAGNOSIS — M545 Low back pain: Secondary | ICD-10-CM | POA: Diagnosis not present

## 2018-10-03 ENCOUNTER — Other Ambulatory Visit: Payer: Self-pay | Admitting: *Deleted

## 2018-10-03 MED ORDER — METOPROLOL SUCCINATE ER 25 MG PO TB24: 25.0000 mg | ORAL_TABLET | Freq: Every day | ORAL | 6 refills | Status: DC

## 2018-10-05 DIAGNOSIS — M545 Low back pain: Secondary | ICD-10-CM | POA: Diagnosis not present

## 2018-10-09 NOTE — Progress Notes (Addendum)
Subjective:   [name] is a 84 y.o. male who presents for Medicare Annual/Subsequent preventive examination.  Review of Systems:  No ROS.  Medicare Wellness Visit. Additional risk factors are reflected in the social history.  Cardiac Risk Factors include: advanced age (>16mn, >>20women);male gender Sleep patterns: feels rested on waking, gets up 1 times nightly to void and sleeps 6-7 hours nightly.    Home Safety/Smoke Alarms: Feels safe in home. Smoke alarms in place.  Living environment; residence and Firearm Safety: 1-story house/ trailer, equipment: CRadio producer Type: Narrow BConocoPhillipsand Walkers, Type: RConservation officer, nature no firearms. Lives alone, no needs for DME, good support system Seat Belt Safety/Bike Helmet: Wears seat belt.      Objective:    Vitals: BP (!) 102/56   Pulse 64   Resp 18   Ht 6' 1"  (1.854 m)   Wt 146 lb (66.2 kg)   SpO2 98%   BMI 19.26 kg/m   Body mass index is 19.26 kg/m.  Advanced Directives 10/10/2018 07/11/2018 02/26/2016  Does Patient Have a Medical Advance Directive? Yes Yes Yes  Type of [place]aramed[place]of [place] will [place] will [place] will  Does patient want to make changes to medical advance directive? - No - Patient declined No - Patient declined  Copy of [place] Lane Villagein Chart? No - copy requested No - copy requested Yes    Tobacco Social History   Tobacco Use  Smoking Status Never Smoker  Smokeless Tobacco Never Used     Counseling given: Not Answered  Past Medical History:  Diagnosis Date  . Alcohol abuse   . Allergic dermatitis   . Atrial fibrillation ([place]   . Celiac disease   . Constipation   . CVA (cerebral infarction) 1987   blood clot, right side and speech issues, recovered quickly  . DJD (degenerative joint disease)   . Macrocytic anemia   . Nonspecific (abnormal) findings on radiological and other examination of  other intrathoracic organs   . Nonspecific abnormal results of thyroid function study   . Olecranon bursitis   . Prostate cancer ([place]   . Pure hypercholesterolemia   . Unspecified hemorrhoids without mention of complication    Past Surgical History:  Procedure Laterality Date  . COLONOSCOPY    . KYPHOPLASTY N/A 07/13/2018   Procedure: KYPHOPLASTY L2, L3, L5;  Surgeon: BMelina Schools MD;  [facility]  Service: Orthopedics;  Laterality: N/A;  120 mins  . prostate biopsy and see implantation    . WRIST FRACTURE SURGERY     Family History  Problem Relation Age of Onset  . Heart disease Unknown        No family history   Social History   Socioeconomic History  . Marital status: Married    Spouse name: Not on file  . Number of children: 2  . Years of education: Not on file  . Highest education level: Not on file  Occupational History  . Occupation: security for [employer]  Social Needs  . Financial resource strain: Not hard at all  . Food insecurity:    Worry: Never true    Inability: Never true  . Transportation needs:    Medical: No    Non-medical: No  Tobacco Use  . Smoking status: Never Smoker  . Smokeless tobacco: Never Used  Substance and Sexual Activity  . Alcohol use: Yes    Comment: occasional (heavy drinker  in the past)  . Drug use: Never  . Sexual activity: Never  Lifestyle  . Physical activity:    Days per week: 0 days    Minutes per session: 0 min  . Stress: Not at all  Relationships  . Social connections:    Talks on phone: More than three times a week    Gets together: More than three times a week    Attends religious service: 1 to 4 times per year    Active member of club or organization: Yes    Attends meetings of clubs or organizations: More than 4 times per year    Relationship status: Married  Other Topics Concern  . Not on file  Social History Narrative  . Not on file    Outpatient Encounter Medications as of 10/10/2018  Medication Sig    . acetaminophen (TYLENOL) 325 MG tablet Take 2 tablets (650 mg total) by mouth every 6 (six) hours as needed.  Marland Kitchen apixaban (ELIQUIS) 5 MG TABS tablet Take 1 tablet (5 mg total) by mouth 2 (two) times daily. Pt may restart on saturday  . atorvastatin (LIPITOR) 20 MG tablet TAKE 1 TABLET BY MOUTH EVERY DAY  . cyanocobalamin 1000 MCG tablet Take 1,000 mcg by mouth daily.   . dapsone 100 MG tablet TAKE 1/2 TABLET BY MOUTH ONCE DAILY  . levothyroxine (SYNTHROID, LEVOTHROID) 50 MCG tablet Take 1 tablet (50 mcg total) by mouth daily.  . metoprolol succinate (TOPROL-XL) 25 MG 24 hr tablet Take 1 tablet (25 mg total) by mouth daily.  . [DISCONTINUED] gabapentin (NEURONTIN) 100 MG capsule Take 1 capsule (100 mg total) by mouth 3 (three) times daily. (Patient not taking: Reported on 10/10/2018)   No facility-administered encounter medications on file as of 10/10/2018.     Activities of Daily Living In your present state of health, do you have any difficulty performing the following activities: 10/10/2018 07/11/2018  Hearing? N N  Vision? N N  Difficulty concentrating or making decisions? N N  Walking or climbing stairs? Y Y  Comment - due to back pain   Dressing or bathing? N -  Doing errands, shopping? N N  Preparing Food and eating ? N -  Using the Toilet? N -  In the past six months, have you accidently leaked urine? N -  Do you have problems with loss of bowel control? N -  Managing your Medications? N -  Managing your Finances? N -  Housekeeping or managing your Housekeeping? N -  Some recent data might be hidden    Patient Care Team: [name], MD as PCP - General (Internal Medicine) Stanford Breed Denice Bors, MD as PCP - Cardiology (Cardiology) Melina Schools, MD as Consulting Physician (Orthopedic Surgery)   Assessment:   This is a routine wellness examination for [name]. Physical assessment deferred to PCP.   Exercise Activities and Dietary recommendations Current Exercise  Habits: Home exercise routine, Type of exercise: walking, Time (Minutes): 30, Frequency (Times/Week): 4, Weekly Exercise (Minutes/Week): 120, Intensity: Mild, Exercise limited by: orthopedic condition(s)(continues physical therapy exercises)  Diet (meal preparation, eat out, water intake, caffeinated beverages, dairy products, fruits and vegetables): in general, a "healthy" diet  , on average, 1-2 meals per day. Reports poor appetite and weight loss.   Discussed supplementing with Ensure, samples and coupons provided. Suggested that patient eat frequently and high calorie. Encouraged patient to increase daily water and healthy fluid intake.  Goals    . Patient Stated  Stay as healthy and as independent as possible.       Fall Risk Fall Risk  10/10/2018 07/20/2018 03/24/2017 02/26/2016 10/04/2014  Falls in the past year? No No Yes No No  Comment - Emmi Telephone Survey: data to providers prior to load - - -  Number falls in past yr: - - 2 or more - -  Injury with Fall? - - No - -  Risk for fall due to : Impaired mobility;Impaired balance/gait - - - -   Depression Screen PHQ 2/9 Scores 10/10/2018 03/24/2017 02/26/2016 10/04/2014  PHQ - 2 Score 1 0 0 0    Cognitive Function MMSE - Mini Mental State Exam 10/10/2018  Orientation to time 5  Orientation to Place 5  Registration 3  Attention/ Calculation 5  Recall 1  Language- name 2 objects 2  Language- repeat 1  Language- follow 3 step command 3  Language- read & follow direction 1  Write a sentence 1  Copy design 1  Total score 28        Immunization History  Administered Date(s) Administered  . Influenza Split 10/09/2011, 09/24/2013  . Influenza, High Dose Seasonal PF 10/10/2018  . Influenza,inj,Quad PF,6+ Mos 10/03/2014, 10/25/2016  . Influenza-Unspecified 09/20/2015  . Pneumococcal Conjugate-13 10/03/2014  . Pneumococcal Polysaccharide-23 06/07/2012  . Tdap 12/20/2009   Screening Tests Health Maintenance  Topic Date Due   . INFLUENZA VACCINE  07/20/2018  . TETANUS/TDAP  12/21/2019  . PNA vac Low Risk Adult  Completed        Plan:   Continue doing brain stimulating activities (puzzles, reading, adult coloring books, staying active) to keep memory sharp.   Continue to eat heart healthy diet (full of fruits, vegetables, whole grains, lean protein, water--limit salt, fat, and sugar intake) and increase physical activity as tolerated.  I have personally reviewed and noted the following in the patient's chart:   . Medical and social history . Use of alcohol, tobacco or illicit drugs  . Current medications and supplements . Functional ability and status . Nutritional status . Physical activity . Advanced directives . List of other physicians . Vitals . Screenings to include cognitive, depression, and falls . Referrals and appointments  In addition, I have reviewed and discussed with patient certain preventive protocols, quality metrics, and best practice recommendations. A written personalized care plan for preventive services as well as general preventive health recommendations were provided to patient.     Michiel Cowboy, RN  10/10/2018  Medical screening examination/treatment/procedure(s) were performed by non-physician practitioner and as supervising physician I was immediately available for consultation/collaboration. I agree with above. [name], MD

## 2018-10-10 ENCOUNTER — Ambulatory Visit (INDEPENDENT_AMBULATORY_CARE_PROVIDER_SITE_OTHER): Payer: Medicare Other | Admitting: *Deleted

## 2018-10-10 VITALS — BP 102/56 | HR 64 | Resp 18 | Ht 73.0 in | Wt 146.0 lb

## 2018-10-10 DIAGNOSIS — Z0000 Encounter for general adult medical examination without abnormal findings: Secondary | ICD-10-CM

## 2018-10-10 DIAGNOSIS — Z23 Encounter for immunization: Secondary | ICD-10-CM | POA: Diagnosis not present

## 2018-10-10 NOTE — Patient Instructions (Addendum)
Continue doing brain stimulating activities (puzzles, reading, adult coloring books, staying active) to keep memory sharp.   Continue to eat heart healthy diet (full of fruits, vegetables, whole grains, lean protein, water--limit salt, fat, and sugar intake) and increase physical activity as tolerated.   [name] , Thank you for taking time to come for your Medicare Wellness Visit. I appreciate your ongoing commitment to your health goals. Please review the following plan we discussed and let me know if I can assist you in the future.   These are the goals we discussed: Goals    . Patient Stated     Stay as healthy and as independent as possible.       This is a list of the screening recommended for you and due dates:  Health Maintenance  Topic Date Due  . Flu Shot  07/20/2018  . Tetanus Vaccine  12/21/2019  . Pneumonia vaccines  Completed   Influenza Virus Vaccine injection What is this medicine? INFLUENZA VIRUS VACCINE (in floo EN zuh VAHY ruhs vak SEEN) helps to reduce the risk of getting influenza also known as the flu. The vaccine only helps protect you against some strains of the flu. This medicine may be used for other purposes; ask your health care provider or pharmacist if you have questions. COMMON BRAND NAME(S): Afluria, Agriflu, Alfuria, FLUAD, Fluarix, Fluarix Quadrivalent, Flublok, Flublok Quadrivalent, FLUCELVAX, Flulaval, Fluvirin, Fluzone, Fluzone High-Dose, Fluzone Intradermal What should I tell my health care provider before I take this medicine? They need to know if you have any of these conditions: -bleeding disorder like hemophilia -fever or infection -Guillain-Barre syndrome or other neurological problems -immune system problems -infection with the human immunodeficiency virus (HIV) or AIDS -low blood platelet counts -multiple sclerosis -an unusual or allergic reaction to influenza virus vaccine, latex, other medicines, foods, dyes, or preservatives.  Different brands of vaccines contain different allergens. Some may contain latex or eggs. Talk to your doctor about your allergies to make sure that you get the right vaccine. -pregnant or trying to get pregnant -breast-feeding How should I use this medicine? This vaccine is for injection into a muscle or under the skin. It is given by a health care professional. A copy of Vaccine Information Statements will be given before each vaccination. Read this sheet carefully each time. The sheet may change frequently. Talk to your healthcare provider to see which vaccines are right for you. Some vaccines should not be used in all age groups. Overdosage: If you think you have taken too much of this medicine contact a poison control center or emergency room at once. NOTE: This medicine is only for you. Do not share this medicine with others. What if I miss a dose? This does not apply. What may interact with this medicine? -chemotherapy or radiation therapy -medicines that lower your immune system like etanercept, anakinra, infliximab, and adalimumab -medicines that treat or prevent blood clots like warfarin -phenytoin -steroid medicines like prednisone or cortisone -theophylline -vaccines This list may not describe all possible interactions. Give your health care provider a list of all the medicines, herbs, non-prescription drugs, or dietary supplements you use. Also tell them if you smoke, drink alcohol, or use illegal drugs. Some items may interact with your medicine. What should I watch for while using this medicine? Report any side effects that do not go away within 3 days to your doctor or health care professional. Call your health care provider if any unusual symptoms occur within 6 weeks of  receiving this vaccine. You may still catch the flu, but the illness is not usually as bad. You cannot get the flu from the vaccine. The vaccine will not protect against colds or other illnesses that may cause  fever. The vaccine is needed every year. What side effects may I notice from receiving this medicine? Side effects that you should report to your doctor or health care professional as soon as possible: -allergic reactions like skin rash, itching or hives, swelling of the face, lips, or tongue Side effects that usually do not require medical attention (report to your doctor or health care professional if they continue or are bothersome): -fever -headache -muscle aches and pains -pain, tenderness, redness, or swelling at the injection site -tiredness This list may not describe all possible side effects. Call your doctor for medical advice about side effects. You may report side effects to FDA at 1-800-FDA-1088. Where should I keep my medicine? The vaccine will be given by a health care professional in a clinic, pharmacy, doctor's office, or other health care setting. You will not be given vaccine doses to store at home. NOTE: This sheet is a summary. It may not cover all possible information. If you have questions about this medicine, talk to your doctor, pharmacist, or health care provider.  2018 Elsevier/Gold Standard (2015-06-27 10:07:28)   Health Maintenance, Male A healthy lifestyle and preventive care is important for your health and wellness. Ask your health care provider about what schedule of regular examinations is right for you. What should I know about weight and diet? Eat a Healthy Diet  Eat plenty of vegetables, fruits, whole grains, low-fat dairy products, and lean protein.  Do not eat a lot of foods high in solid fats, added sugars, or salt.  Maintain a Healthy Weight Regular exercise can help you achieve or maintain a healthy weight. You should:  Do at least 150 minutes of exercise each week. The exercise should increase your heart rate and make you sweat (moderate-intensity exercise).  Do strength-training exercises at least twice a week.  Watch Your Levels of  Cholesterol and Blood Lipids  Have your blood tested for lipids and cholesterol every 5 years starting at 82 years of age. If you are at high risk for heart disease, you should start having your blood tested when you are 82 years old. You may need to have your cholesterol levels checked more often if: ? Your lipid or cholesterol levels are high. ? You are older than 82 years of age. ? You are at high risk for heart disease.  What should I know about cancer screening? Many types of cancers can be detected early and may often be prevented. Lung Cancer  You should be screened every year for lung cancer if: ? You are a current smoker who has smoked for at least 30 years. ? You are a former smoker who has quit within the past 15 years.  Talk to your health care provider about your screening options, when you should start screening, and how often you should be screened.  Colorectal Cancer  Routine colorectal cancer screening usually begins at 82 years of age and should be repeated every 5-10 years until you are 82 years old. You may need to be screened more often if early forms of precancerous polyps or small growths are found. Your health care provider may recommend screening at an earlier age if you have risk factors for colon cancer.  Your health care provider may recommend using home  test kits to check for hidden blood in the stool.  A small camera at the end of a tube can be used to examine your colon (sigmoidoscopy or colonoscopy). This checks for the earliest forms of colorectal cancer.  Prostate and Testicular Cancer  Depending on your age and overall health, your health care provider may do certain tests to screen for prostate and testicular cancer.  Talk to your health care provider about any symptoms or concerns you have about testicular or prostate cancer.  Skin Cancer  Check your skin from head to toe regularly.  Tell your health care provider about any new moles or changes  in moles, especially if: ? There is a change in a mole's size, shape, or color. ? You have a mole that is larger than a pencil eraser.  Always use sunscreen. Apply sunscreen liberally and repeat throughout the day.  Protect yourself by wearing long sleeves, pants, a wide-brimmed hat, and sunglasses when outside.  What should I know about heart disease, diabetes, and high blood pressure?  If you are 58-66 years of age, have your blood pressure checked every 3-5 years. If you are 44 years of age or older, have your blood pressure checked every year. You should have your blood pressure measured twice-once when you are at a hospital or clinic, and once when you are not at a hospital or clinic. Record the average of the two measurements. To check your blood pressure when you are not at a hospital or clinic, you can use: ? An automated blood pressure machine at a pharmacy. ? A home blood pressure monitor.  Talk to your health care provider about your target blood pressure.  If you are between 77-26 years old, ask your health care provider if you should take aspirin to prevent heart disease.  Have regular diabetes screenings by checking your fasting blood sugar level. ? If you are at a normal weight and have a low risk for diabetes, have this test once every three years after the age of 36. ? If you are overweight and have a high risk for diabetes, consider being tested at a younger age or more often.  A one-time screening for abdominal aortic aneurysm (AAA) by ultrasound is recommended for men aged 66-75 years who are current or former smokers. What should I know about preventing infection? Hepatitis B If you have a higher risk for hepatitis B, you should be screened for this virus. Talk with your health care provider to find out if you are at risk for hepatitis B infection. Hepatitis C Blood testing is recommended for:  Everyone born from [birth year] through 1963.  Anyone with known risk factors  for hepatitis C.  Sexually Transmitted Diseases (STDs)  You should be screened each year for STDs including gonorrhea and chlamydia if: ? You are sexually active and are younger than 82 years of age. ? You are older than 82 years of age and your health care provider tells you that you are at risk for this type of infection. ? Your sexual activity has changed since you were last screened and you are at an increased risk for chlamydia or gonorrhea. Ask your health care provider if you are at risk.  Talk with your health care provider about whether you are at high risk of being infected with HIV. Your health care provider may recommend a prescription medicine to help prevent HIV infection.  What else can I do?  Schedule regular health, dental, and eye exams.  Stay current with your vaccines (immunizations).  Do not use any tobacco products, such as cigarettes, chewing tobacco, and e-cigarettes. If you need help quitting, ask your health care provider.  Limit alcohol intake to no more than 2 drinks per day. One drink equals 12 ounces of beer, 5 ounces of Emmalene Kattner, or 1 ounces of hard liquor.  Do not use street drugs.  Do not share needles.  Ask your health care provider for help if you need support or information about quitting drugs.  Tell your health care provider if you often feel depressed.  Tell your health care provider if you have ever been abused or do not feel safe at home. This information is not intended to replace advice given to you by your health care provider. Make sure you discuss any questions you have with your health care provider. Document Released: 06/03/2008 Document Revised: 08/04/2016 Document Reviewed: 09/09/2015 Elsevier Interactive Patient Education  [employer].

## 2018-10-16 ENCOUNTER — Other Ambulatory Visit: Payer: Self-pay | Admitting: Internal Medicine

## 2018-10-16 DIAGNOSIS — E785 Hyperlipidemia, unspecified: Secondary | ICD-10-CM

## 2018-10-24 DIAGNOSIS — H04123 Dry eye syndrome of bilateral lacrimal glands: Secondary | ICD-10-CM | POA: Diagnosis not present

## 2018-10-24 DIAGNOSIS — H5213 Myopia, bilateral: Secondary | ICD-10-CM | POA: Diagnosis not present

## 2018-10-24 DIAGNOSIS — H524 Presbyopia: Secondary | ICD-10-CM | POA: Diagnosis not present

## 2018-10-24 DIAGNOSIS — Z961 Presence of intraocular lens: Secondary | ICD-10-CM | POA: Diagnosis not present

## 2018-10-24 DIAGNOSIS — H52203 Unspecified astigmatism, bilateral: Secondary | ICD-10-CM | POA: Diagnosis not present

## 2018-11-12 ENCOUNTER — Other Ambulatory Visit: Payer: Self-pay | Admitting: Internal Medicine

## 2018-11-12 DIAGNOSIS — K900 Celiac disease: Secondary | ICD-10-CM

## 2018-11-12 DIAGNOSIS — E038 Other specified hypothyroidism: Secondary | ICD-10-CM

## 2018-11-28 DIAGNOSIS — M545 Low back pain: Secondary | ICD-10-CM | POA: Diagnosis not present

## 2018-12-24 NOTE — Progress Notes (Signed)
HPI: FU atrial fibrillation. Patient had electrocardiogram on 07/20/2013 that showed atrial fibrillation. We treated with rate control/anticoagulation. Echo repeated February 2017 and showed normal LV systolic function, restricted posterior mitral valve leaflet with moderate to severe mitral regurgitation, moderate biatrial enlargement and moderate tricuspid regurgitation. Lower ext dopplers 3/18 show no DVT. Patient developed pedal edema previously and was treated with diuretics. His edema resolved but he developed orthostasis followed by syncope. Echo 04/22/2017 showed ejection fraction 45-50%, mildly dilated aortic root, moderate mitral regurgitation, moderate left atrial enlargement, severe right atrial enlargement, severe tricuspid regurgitation and severely elevated pulmonary pressure. I did review the echocardiogram and feel the ejection fraction is normal. We are attempting conservative measures given age. I last saw 9/18. Since last seen,  patient denies dyspnea, chest pain, palpitations or syncope.  He has noticed recurrent pedal edema since he is not taking his diuretic.  Current Outpatient Medications  Medication Sig Dispense Refill  . acetaminophen (TYLENOL) 325 MG tablet Take 2 tablets (650 mg total) by mouth every 6 (six) hours as needed. 30 tablet 0  . apixaban (ELIQUIS) 5 MG TABS tablet Take 1 tablet (5 mg total) by mouth 2 (two) times daily. Pt may restart on saturday 180 tablet 1  . atorvastatin (LIPITOR) 20 MG tablet TAKE 1 TABLET BY MOUTH EVERY DAY 90 tablet 1  . cyanocobalamin 1000 MCG tablet Take 1,000 mcg by mouth daily.     . dapsone 100 MG tablet TAKE 1/2 TABLET BY MOUTH ONCE DAILY 45 tablet 1  . levothyroxine (SYNTHROID, LEVOTHROID) 50 MCG tablet Take 1 tablet (50 mcg total) by mouth daily. 90 tablet 0  . metoprolol succinate (TOPROL-XL) 25 MG 24 hr tablet Take 1 tablet (25 mg total) by mouth daily. 30 tablet 6   No current facility-administered medications for this  visit.      Past Medical History:  Diagnosis Date  . Alcohol abuse   . Allergic dermatitis   . Atrial fibrillation ([place])   . Celiac disease   . Constipation   . CVA (cerebral infarction) 1987   blood clot, right side and speech issues, recovered quickly  . DJD (degenerative joint disease)   . Macrocytic anemia   . Nonspecific (abnormal) findings on radiological and other examination of other intrathoracic organs   . Nonspecific abnormal results of thyroid function study   . Olecranon bursitis   . Prostate cancer ([place])   . Pure hypercholesterolemia   . Unspecified hemorrhoids without mention of complication     Past Surgical History:  Procedure Laterality Date  . COLONOSCOPY    . KYPHOPLASTY N/A 07/13/2018   Procedure: KYPHOPLASTY L2, L3, L5;  Surgeon: Melina Schools, MD;  Location: [facility];  Service: Orthopedics;  Laterality: N/A;  120 mins  . prostate biopsy and see implantation    . WRIST FRACTURE SURGERY      Social History   Socioeconomic History  . Marital status: Married    Spouse name: Not on file  . Number of children: 2  . Years of education: Not on file  . Highest education level: Not on file  Occupational History  . Occupation: security for [employer]  Social Needs  . Financial resource strain: Not hard at all  . Food insecurity:    Worry: Never true    Inability: Never true  . Transportation needs:    Medical: No    Non-medical: No  Tobacco Use  . Smoking status: Never Smoker  . Smokeless tobacco:  Never Used  Substance and Sexual Activity  . Alcohol use: Yes    Comment: occasional (heavy drinker in the past)  . Drug use: Never  . Sexual activity: Never  Lifestyle  . Physical activity:    Days per week: 0 days    Minutes per session: 0 min  . Stress: Not at all  Relationships  . Social connections:    Talks on phone: More than three times a week    Gets together: More than three times a week    Attends religious service: 1 to 4 times per year     Active member of club or organization: Yes    Attends meetings of clubs or organizations: More than 4 times per year    Relationship status: Married  . Intimate partner violence:    Fear of current or ex partner: No    Emotionally abused: No    Physically abused: No    Forced sexual activity: No  Other Topics Concern  . Not on file  Social History Narrative  . Not on file    Family History  Problem Relation Age of Onset  . Heart disease Unknown        No family history    ROS: no fevers or chills, productive cough, hemoptysis, dysphasia, odynophagia, melena, hematochezia, dysuria, hematuria, rash, seizure activity, orthopnea, PND, pedal edema, claudication. Remaining systems are negative.  Physical Exam: Well-developed well-nourished in no acute distress.  Skin is warm and dry.  HEENT is normal.  Neck is supple.  Chest is clear to auscultation with normal expansion.  Cardiovascular exam is irregular, 2/6systolic murmur apex Abdominal exam nontender or distended. No masses palpated. Extremities show 1+ edema. neuro grossly intact   A/P  1 permanent atrial fibrillation-patient continues to do well from a symptomatic standpoint.  We will continue beta-blockade for rate control.  Continue apixaban.  Check hemoglobin and renal function.  2 mitral regurgitation-as outlined in previous notes patient has moderate mitral regurgitation and severe tricuspid regurgitation on most recent echocardiogram.  Severely elevated pulmonary pressures.  I felt his LV function was normal.  We are trying to treat conservatively given his age and at his request.  We will repeat echocardiogram.  Note he is also developed recurrent pedal edema but is not taking a diuretic.  I will add Lasix 20 mg daily.  In 1 week check potassium and renal function.  3 hyperlipidemia-continue statin.  Kirk Ruths, MD

## 2019-01-02 ENCOUNTER — Encounter (INDEPENDENT_AMBULATORY_CARE_PROVIDER_SITE_OTHER): Payer: Self-pay

## 2019-01-02 ENCOUNTER — Encounter: Payer: Self-pay | Admitting: Cardiology

## 2019-01-02 ENCOUNTER — Ambulatory Visit (INDEPENDENT_AMBULATORY_CARE_PROVIDER_SITE_OTHER): Payer: Medicare Other | Admitting: Cardiology

## 2019-01-02 VITALS — BP 120/68 | HR 70 | Ht 73.0 in | Wt 160.8 lb

## 2019-01-02 DIAGNOSIS — I4821 Permanent atrial fibrillation: Secondary | ICD-10-CM | POA: Diagnosis not present

## 2019-01-02 DIAGNOSIS — R609 Edema, unspecified: Secondary | ICD-10-CM

## 2019-01-02 DIAGNOSIS — I059 Rheumatic mitral valve disease, unspecified: Secondary | ICD-10-CM

## 2019-01-02 DIAGNOSIS — I340 Nonrheumatic mitral (valve) insufficiency: Secondary | ICD-10-CM | POA: Diagnosis not present

## 2019-01-02 MED ORDER — FUROSEMIDE 20 MG PO TABS: 20.0000 mg | ORAL_TABLET | Freq: Every day | ORAL | 3 refills | Status: DC

## 2019-01-02 NOTE — Patient Instructions (Signed)
Medication Instructions:  START FUROSEMIDE 20 MG ONCE DAILY If you need a refill on your cardiac medications before your next appointment, please call your pharmacy.   Lab work: Your physician recommends that you return for lab work in: [place] If you have labs (blood work) drawn today and your tests are completely normal, you will receive your results only by: Marland Kitchen MyChart Message (if you have MyChart) OR . A paper copy in the mail If you have any lab test that is abnormal or we need to change your treatment, we will call you to review the results.  Testing/Procedures: Your physician has requested that you have an echocardiogram. Echocardiography is a painless test that uses sound waves to create images of your heart. It provides your doctor with information about the size and shape of your heart and how well your heart's chambers and valves are working. This procedure takes approximately one hour. There are no restrictions for this procedure.  [place]  Follow-Up: At [facility], you and your health needs are our priority.  As part of our continuing mission to provide you with exceptional heart care, we have created designated Provider Care Teams.  These Care Teams include your primary Cardiologist (physician) and Advanced Practice Providers (APPs -  Physician Assistants and Nurse Practitioners) who all work together to provide you with the care you need, when you need it.  Your physician recommends that you schedule a follow-up appointment in: [place]

## 2019-01-10 ENCOUNTER — Other Ambulatory Visit: Payer: Medicare Other | Admitting: *Deleted

## 2019-01-10 ENCOUNTER — Ambulatory Visit (HOSPITAL_COMMUNITY): Payer: Medicare Other | Attending: Cardiology

## 2019-01-10 ENCOUNTER — Other Ambulatory Visit: Payer: Self-pay

## 2019-01-10 DIAGNOSIS — I340 Nonrheumatic mitral (valve) insufficiency: Secondary | ICD-10-CM

## 2019-01-10 DIAGNOSIS — R609 Edema, unspecified: Secondary | ICD-10-CM | POA: Diagnosis not present

## 2019-01-11 ENCOUNTER — Telehealth: Payer: Self-pay | Admitting: *Deleted

## 2019-01-11 LAB — BASIC METABOLIC PANEL
BUN/Creatinine Ratio: 23 (ref 10–24)
BUN: 17 mg/dL (ref 8–27)
CHLORIDE: 98 mmol/L (ref 96–106)
CO2: 24 mmol/L (ref 20–29)
Calcium: 9.1 mg/dL (ref 8.6–10.2)
Creatinine, Ser: 0.75 mg/dL — ABNORMAL LOW (ref 0.76–1.27)
GFR calc Af Amer: 96 mL/min/{1.73_m2} (ref >59)
GFR calc non Af Amer: 83 mL/min/{1.73_m2} (ref >59)
GLUCOSE: 94 mg/dL (ref 65–99)
POTASSIUM: 4.3 mmol/L (ref 3.5–5.2)
SODIUM: 139 mmol/L (ref 134–144)

## 2019-01-11 LAB — CBC
Hematocrit: 31.3 % — ABNORMAL LOW (ref 37.5–51.0)
Hemoglobin: 10.8 g/dL — ABNORMAL LOW (ref 13.0–17.7)
MCH: 34.1 pg — ABNORMAL HIGH (ref 26.6–33.0)
MCHC: 34.5 g/dL (ref 31.5–35.7)
MCV: 99 fL — ABNORMAL HIGH (ref 79–97)
PLATELETS: 196 10*3/uL (ref 150–450)
RBC: 3.17 x10E6/uL — ABNORMAL LOW (ref 4.14–5.80)
RDW: 11.8 % (ref 11.6–15.4)
WBC: 5.3 10*3/uL (ref 3.4–10.8)

## 2019-01-11 NOTE — Telephone Encounter (Signed)
-----   Message from Lelon Perla, MD sent at 01/11/2019  7:04 AM EST ----- No change in meds [name]

## 2019-01-11 NOTE — Telephone Encounter (Signed)
Left message to call back  

## 2019-01-12 ENCOUNTER — Encounter: Payer: Self-pay | Admitting: *Deleted

## 2019-01-12 NOTE — Telephone Encounter (Signed)
Notes recorded by Cristopher Estimable, RN on 01/12/2019 at 9:36 AM EST Letter of results sent to pt

## 2019-02-04 ENCOUNTER — Other Ambulatory Visit: Payer: Self-pay | Admitting: Internal Medicine

## 2019-02-04 DIAGNOSIS — E038 Other specified hypothyroidism: Secondary | ICD-10-CM

## 2019-03-09 ENCOUNTER — Other Ambulatory Visit: Payer: Self-pay | Admitting: Cardiology

## 2019-03-09 MED ORDER — APIXABAN 5 MG PO TABS: 5.0000 mg | ORAL_TABLET | Freq: Two times a day (BID) | ORAL | 1 refills | Status: DC

## 2019-03-09 NOTE — Telephone Encounter (Signed)
New Message     *STAT* If patient is at the pharmacy, call can be transferred to refill team.   1. Which medications need to be refilled? (please list name of each medication and dose if known) Eliquis  2. Which pharmacy/location (including street and city if local pharmacy) is medication to be sent to? [facility] [address]   3. Do they need a 30 day or 90 day supply? [place]

## 2019-03-30 ENCOUNTER — Other Ambulatory Visit: Payer: Self-pay | Admitting: Cardiology

## 2019-03-30 NOTE — Telephone Encounter (Signed)
Metoprolol refilled.

## 2019-04-04 ENCOUNTER — Encounter: Payer: Self-pay | Admitting: Cardiology

## 2019-04-04 NOTE — Telephone Encounter (Signed)
This encounter was created in error - please disregard.

## 2019-04-04 NOTE — Telephone Encounter (Signed)
New Message  Patient returning your call.

## 2019-04-06 ENCOUNTER — Ambulatory Visit: Payer: Medicare Other | Admitting: Cardiology

## 2019-04-18 ENCOUNTER — Other Ambulatory Visit: Payer: Self-pay | Admitting: Internal Medicine

## 2019-04-18 DIAGNOSIS — E785 Hyperlipidemia, unspecified: Secondary | ICD-10-CM

## 2019-04-29 ENCOUNTER — Other Ambulatory Visit: Payer: Self-pay | Admitting: Internal Medicine

## 2019-04-29 DIAGNOSIS — E038 Other specified hypothyroidism: Secondary | ICD-10-CM

## 2019-05-11 ENCOUNTER — Other Ambulatory Visit: Payer: Self-pay | Admitting: Internal Medicine

## 2019-05-11 DIAGNOSIS — K900 Celiac disease: Secondary | ICD-10-CM

## 2019-07-20 ENCOUNTER — Telehealth: Payer: Self-pay

## 2019-07-20 NOTE — Telephone Encounter (Signed)

## 2019-07-26 ENCOUNTER — Other Ambulatory Visit: Payer: Self-pay | Admitting: Internal Medicine

## 2019-07-26 DIAGNOSIS — E038 Other specified hypothyroidism: Secondary | ICD-10-CM

## 2019-07-26 NOTE — Progress Notes (Signed)
Virtual Visit via Video Note changed to phone visit at pt request   This visit type was conducted due to national recommendations for restrictions regarding the COVID-19 Pandemic (e.g. social distancing) in an effort to limit this patient's exposure and mitigate transmission in our community.  Due to his co-morbid illnesses, this patient is at least at moderate risk for complications without adequate follow up.  This format is felt to be most appropriate for this patient at this time.  All issues noted in this document were discussed and addressed.  A limited physical exam was performed with this format.  Please refer to the patient's chart for his consent to telehealth for [facility].   Date:  07/27/2019   ID:  Steward Drone, DOB 1930-04-19, MRN [id number]  Patient Location:Home Provider Location: Home  PCP:  [name], MD  Cardiologist:  Dr Stanford Breed  Evaluation Performed:  Follow-Up Visit  Chief Complaint:  FU atrial fibrillation  History of Present Illness:    FU atrial fibrillation, MR and TR. Patient had electrocardiogram on 07/20/2013 that showed atrial fibrillation. We treated with rate control/anticoagulation. Lower ext dopplers 3/18 show no DVT. Patient developed pedal edema previously and was treated with diuretics. His edema resolved but he developed orthostasis followed by syncope. Last echocardiogram January 2020 showed normal LV function, moderate left ventricular hypertrophy, moderate mitral regurgitation, severe biatrial enlargement, severe right ventricular enlargement with severe RV dysfunction, severe tricuspid regurgitation and severe pulmonary hypertension.  We are treating his valvular heart disease conservatively given his age.  Since last seen,he has dyspnea on exertion but no orthopnea, PND, pedal edema, chest pain or syncope.  The patient does not have symptoms concerning for COVID-19 infection (fever, chills, cough, or new shortness of breath).    Past  Medical History:  Diagnosis Date  . Alcohol abuse   . Allergic dermatitis   . Atrial fibrillation ([place])   . Celiac disease   . Constipation   . CVA (cerebral infarction) 1987   blood clot, right side and speech issues, recovered quickly  . DJD (degenerative joint disease)   . Macrocytic anemia   . Nonspecific (abnormal) findings on radiological and other examination of other intrathoracic organs   . Nonspecific abnormal results of thyroid function study   . Olecranon bursitis   . Prostate cancer ([place])   . Pure hypercholesterolemia   . Unspecified hemorrhoids without mention of complication    Past Surgical History:  Procedure Laterality Date  . COLONOSCOPY    . KYPHOPLASTY N/A 07/13/2018   Procedure: KYPHOPLASTY L2, L3, L5;  Surgeon: Melina Schools, MD;  Location: [facility];  Service: Orthopedics;  Laterality: N/A;  120 mins  . prostate biopsy and see implantation    . WRIST FRACTURE SURGERY       Current Meds  Medication Sig  . apixaban (ELIQUIS) 5 MG TABS tablet Take 1 tablet (5 mg total) by mouth 2 (two) times daily. Pt may restart on saturday  . atorvastatin (LIPITOR) 20 MG tablet TAKE 1 TABLET BY MOUTH EVERY DAY  . cyanocobalamin 1000 MCG tablet Take 1,000 mcg by mouth daily.   . dapsone 100 MG tablet TAKE 1/2 TABLET BY MOUTH EVERY DAY  . furosemide (LASIX) 20 MG tablet Take 1 tablet (20 mg total) by mouth daily.  Marland Kitchen levothyroxine (SYNTHROID) 50 MCG tablet TAKE 1 TABLET BY MOUTH EVERY DAY  . metoprolol succinate (TOPROL-XL) 25 MG 24 hr tablet TAKE 1 TABLET BY MOUTH EVERY DAY  Allergies:   Patient has no known allergies.   Social History   Tobacco Use  . Smoking status: Never Smoker  . Smokeless tobacco: Never Used  Substance Use Topics  . Alcohol use: Yes    Comment: occasional (heavy drinker in the past)  . Drug use: Never     Family Hx: The patient's family history includes Heart disease in his unknown relative.  ROS:   Please see the history of present  illness.    No Fever, chills  or productive cough All other systems reviewed and are negative.  Recent Labs: 01/10/2019: BUN 17; Creatinine, Ser 0.75; Hemoglobin 10.8; Platelets 196; Potassium 4.3; Sodium 139   Recent Lipid Panel Lab Results  Component Value Date/Time   CHOL 113 01/20/2017 11:29 AM   TRIG 80 01/20/2017 11:29 AM   HDL 51 01/20/2017 11:29 AM   CHOLHDL 2.2 01/20/2017 11:29 AM   LDLCALC 46 01/20/2017 11:29 AM    Wt Readings from Last 3 Encounters:  07/27/19 160 lb (72.6 kg)  01/02/19 160 lb 12.8 oz (72.9 kg)  10/10/18 146 lb (66.2 kg)     Objective:    Vital Signs:  Ht 6' 1"  (1.854 m)   Wt 160 lb (72.6 kg)   BMI 21.11 kg/m    VITAL SIGNS:  reviewed NAD Answers questions appropriately Normal affect Remainder of physical examination not performed (telehealth visit; coronavirus pandemic)  ASSESSMENT & PLAN:    1. History of moderate mitral regurgitation and severe tricuspid regurgitation-previous echocardiogram showed normal LV function.  Given his age we are treating conservatively and pt in agreement.  Continue present dose of Lasix.  Repeat echocardiogram January 2021. 2. Permanent atrial fibrillation-plan to continue beta-blocker for rate control.  Continue apixaban.  Check hemoglobin and renal function. 3. Hyperlipidemia-continue statin.  Check lipids and liver.  COVID-19 Education: The importance of social distancing was discussed today.  Time:   Today, I have spent 17 minutes with the patient with telehealth technology discussing the above problems.     Medication Adjustments/Labs and Tests Ordered: Current medicines are reviewed at length with the patient today.  Concerns regarding medicines are outlined above.   Tests Ordered: No orders of the defined types were placed in this encounter.   Medication Changes: No orders of the defined types were placed in this encounter.   Follow Up:  Virtual Visit or In Person in 6 month(s)  Signed,  Kirk Ruths, MD  07/27/2019 9:16 AM    [place]

## 2019-07-27 ENCOUNTER — Telehealth (INDEPENDENT_AMBULATORY_CARE_PROVIDER_SITE_OTHER): Payer: Medicare Other | Admitting: Cardiology

## 2019-07-27 ENCOUNTER — Encounter: Payer: Self-pay | Admitting: Cardiology

## 2019-07-27 VITALS — Ht 73.0 in | Wt 160.0 lb

## 2019-07-27 DIAGNOSIS — I4821 Permanent atrial fibrillation: Secondary | ICD-10-CM

## 2019-07-27 DIAGNOSIS — I059 Rheumatic mitral valve disease, unspecified: Secondary | ICD-10-CM

## 2019-07-27 DIAGNOSIS — E7800 Pure hypercholesterolemia, unspecified: Secondary | ICD-10-CM | POA: Diagnosis not present

## 2019-07-27 NOTE — Patient Instructions (Signed)
Medication Instructions:  NO CHANGE If you need a refill on your cardiac medications before your next appointment, please call your pharmacy.   Lab work: Your physician recommends that you return for lab work PRIOR TO EATING If you have labs (blood work) drawn today and your tests are completely normal, you will receive your results only by: Marland Kitchen MyChart Message (if you have MyChart) OR . A paper copy in the mail If you have any lab test that is abnormal or we need to change your treatment, we will call you to review the results.  Testing/Procedures: Your physician has requested that you have an echocardiogram. Echocardiography is a painless test that uses sound waves to create images of your heart. It provides your doctor with information about the size and shape of your heart and how well your heart's chambers and valves are working. This procedure takes approximately one hour. There are no restrictions for this procedure.[place] January 2021    Follow-Up: At [facility], you and your health needs are our priority.  As part of our continuing mission to provide you with exceptional heart care, we have created designated Provider Care Teams.  These Care Teams include your primary Cardiologist (physician) and Advanced Practice Providers (APPs -  Physician Assistants and Nurse Practitioners) who all work together to provide you with the care you need, when you need it. You will need a follow up appointment in 6 months.  Please call our office 2 months in advance to schedule this appointment.  You may see Kirk Ruths, MD or one of the following Advanced Practice Providers on your designated Care Team:   Kerin Ransom, PA-C Roby Lofts, [place] . Sande Rives, PA-C

## 2019-08-08 ENCOUNTER — Other Ambulatory Visit: Payer: Self-pay | Admitting: Internal Medicine

## 2019-08-08 DIAGNOSIS — K900 Celiac disease: Secondary | ICD-10-CM

## 2019-08-26 ENCOUNTER — Other Ambulatory Visit: Payer: Self-pay | Admitting: Internal Medicine

## 2019-08-26 DIAGNOSIS — E038 Other specified hypothyroidism: Secondary | ICD-10-CM

## 2019-09-12 ENCOUNTER — Other Ambulatory Visit: Payer: Self-pay

## 2019-09-12 MED ORDER — APIXABAN 5 MG PO TABS: 5.0000 mg | ORAL_TABLET | Freq: Two times a day (BID) | ORAL | 1 refills | Status: DC

## 2019-09-14 ENCOUNTER — Other Ambulatory Visit: Payer: Self-pay | Admitting: Internal Medicine

## 2019-09-14 DIAGNOSIS — E038 Other specified hypothyroidism: Secondary | ICD-10-CM

## 2019-09-21 DIAGNOSIS — I4821 Permanent atrial fibrillation: Secondary | ICD-10-CM | POA: Diagnosis not present

## 2019-09-21 DIAGNOSIS — E7800 Pure hypercholesterolemia, unspecified: Secondary | ICD-10-CM | POA: Diagnosis not present

## 2019-09-21 LAB — CBC
Hematocrit: 32.9 % — ABNORMAL LOW (ref 37.5–51.0)
Hemoglobin: 11 g/dL — ABNORMAL LOW (ref 13.0–17.7)
MCH: 33.7 pg — ABNORMAL HIGH (ref 26.6–33.0)
MCHC: 33.4 g/dL (ref 31.5–35.7)
MCV: 101 fL — ABNORMAL HIGH (ref 79–97)
Platelets: 183 10*3/uL (ref 150–450)
RBC: 3.26 x10E6/uL — ABNORMAL LOW (ref 4.14–5.80)
RDW: 12.6 % (ref 11.6–15.4)
WBC: 5.3 10*3/uL (ref 3.4–10.8)

## 2019-09-22 LAB — LIPID PANEL
Chol/HDL Ratio: 2.8 ratio (ref 0.0–5.0)
Cholesterol, Total: 118 mg/dL (ref 100–199)
HDL: 42 mg/dL (ref >39)
LDL Chol Calc (NIH): 57 mg/dL (ref 0–99)
Triglycerides: 99 mg/dL (ref 0–149)
VLDL Cholesterol Cal: 19 mg/dL (ref 5–40)

## 2019-09-22 LAB — COMPREHENSIVE METABOLIC PANEL WITH GFR
ALT: 38 IU/L (ref 0–44)
AST: 50 IU/L — ABNORMAL HIGH (ref 0–40)
Albumin/Globulin Ratio: 1.3 (ref 1.2–2.2)
Albumin: 4.3 g/dL (ref 3.6–4.6)
Alkaline Phosphatase: 90 IU/L (ref 39–117)
BUN/Creatinine Ratio: 25 — ABNORMAL HIGH (ref 10–24)
BUN: 20 mg/dL (ref 8–27)
Bilirubin Total: 0.7 mg/dL (ref 0.0–1.2)
CO2: 24 mmol/L (ref 20–29)
Calcium: 9 mg/dL (ref 8.6–10.2)
Chloride: 102 mmol/L (ref 96–106)
Creatinine, Ser: 0.79 mg/dL (ref 0.76–1.27)
GFR calc Af Amer: 93 mL/min/1.73 (ref >59)
GFR calc non Af Amer: 81 mL/min/1.73 (ref >59)
Globulin, Total: 3.2 g/dL (ref 1.5–4.5)
Glucose: 89 mg/dL (ref 65–99)
Potassium: 4.6 mmol/L (ref 3.5–5.2)
Sodium: 137 mmol/L (ref 134–144)
Total Protein: 7.5 g/dL (ref 6.0–8.5)

## 2019-09-23 ENCOUNTER — Other Ambulatory Visit: Payer: Self-pay | Admitting: Cardiology

## 2019-09-24 ENCOUNTER — Encounter: Payer: Self-pay | Admitting: *Deleted

## 2019-10-04 ENCOUNTER — Other Ambulatory Visit: Payer: Self-pay | Admitting: Internal Medicine

## 2019-10-04 DIAGNOSIS — E038 Other specified hypothyroidism: Secondary | ICD-10-CM

## 2019-10-18 ENCOUNTER — Ambulatory Visit: Payer: Medicare Other | Admitting: Internal Medicine

## 2019-10-24 ENCOUNTER — Other Ambulatory Visit (INDEPENDENT_AMBULATORY_CARE_PROVIDER_SITE_OTHER): Payer: Medicare Other

## 2019-10-24 ENCOUNTER — Ambulatory Visit (INDEPENDENT_AMBULATORY_CARE_PROVIDER_SITE_OTHER): Payer: Medicare Other | Admitting: Internal Medicine

## 2019-10-24 ENCOUNTER — Encounter: Payer: Self-pay | Admitting: Internal Medicine

## 2019-10-24 ENCOUNTER — Other Ambulatory Visit: Payer: Self-pay

## 2019-10-24 VITALS — BP 130/70 | HR 78 | Temp 98.2°F | Resp 16 | Ht 73.0 in | Wt 166.0 lb

## 2019-10-24 DIAGNOSIS — I4821 Permanent atrial fibrillation: Secondary | ICD-10-CM | POA: Diagnosis not present

## 2019-10-24 DIAGNOSIS — Z23 Encounter for immunization: Secondary | ICD-10-CM

## 2019-10-24 DIAGNOSIS — D539 Nutritional anemia, unspecified: Secondary | ICD-10-CM

## 2019-10-24 DIAGNOSIS — K900 Celiac disease: Secondary | ICD-10-CM | POA: Diagnosis not present

## 2019-10-24 DIAGNOSIS — E038 Other specified hypothyroidism: Secondary | ICD-10-CM | POA: Diagnosis not present

## 2019-10-24 DIAGNOSIS — Z0000 Encounter for general adult medical examination without abnormal findings: Secondary | ICD-10-CM

## 2019-10-24 DIAGNOSIS — E039 Hypothyroidism, unspecified: Secondary | ICD-10-CM

## 2019-10-24 LAB — CBC WITH DIFFERENTIAL/PLATELET
Basophils Absolute: 0 10*3/uL (ref 0.0–0.1)
Basophils Relative: 0.5 % (ref 0.0–3.0)
Eosinophils Absolute: 0 10*3/uL (ref 0.0–0.7)
Eosinophils Relative: 0.7 % (ref 0.0–5.0)
HCT: 35.1 % — ABNORMAL LOW (ref 39.0–52.0)
Hemoglobin: 11.6 g/dL — ABNORMAL LOW (ref 13.0–17.0)
Lymphocytes Relative: 30.9 % (ref 12.0–46.0)
Lymphs Abs: 2 10*3/uL (ref 0.7–4.0)
MCHC: 33.1 g/dL (ref 30.0–36.0)
MCV: 105.2 fl — ABNORMAL HIGH (ref 78.0–100.0)
Monocytes Absolute: 0.6 10*3/uL (ref 0.1–1.0)
Monocytes Relative: 8.9 % (ref 3.0–12.0)
Neutro Abs: 3.9 10*3/uL (ref 1.4–7.7)
Neutrophils Relative %: 59 % (ref 43.0–77.0)
Platelets: 182 10*3/uL (ref 150.0–400.0)
RBC: 3.33 Mil/uL — ABNORMAL LOW (ref 4.22–5.81)
RDW: 14.1 % (ref 11.5–15.5)
WBC: 6.6 10*3/uL (ref 4.0–10.5)

## 2019-10-24 LAB — IBC PANEL
Iron: 89 ug/dL (ref 42–165)
Saturation Ratios: 29.4 % (ref 20.0–50.0)
Transferrin: 216 mg/dL (ref 212.0–360.0)

## 2019-10-24 LAB — FERRITIN: Ferritin: 128.7 ng/mL (ref 22.0–322.0)

## 2019-10-24 LAB — TSH: TSH: 7.23 u[IU]/mL — ABNORMAL HIGH (ref 0.35–4.50)

## 2019-10-24 LAB — FOLATE: Folate: 15.7 ng/mL (ref >5.9)

## 2019-10-24 LAB — VITAMIN B12: Vitamin B-12: 322 pg/mL (ref 211–911)

## 2019-10-24 MED ORDER — LEVOTHYROXINE SODIUM 50 MCG PO TABS: 50.0000 ug | ORAL_TABLET | Freq: Every day | ORAL | 1 refills | Status: DC

## 2019-10-24 NOTE — Patient Instructions (Signed)

## 2019-10-24 NOTE — Progress Notes (Signed)
Subjective:  Patient ID: [name] [name], male    DOB: [birth year]  Age: 85 y.o. MRN: [id number]  CC: Annual Exam, Hypothyroidism, and Anemia   HPI [name] [name] presents for a CPX.  He has been out of his thyroid supplement for 2 weeks.  He has had no symptoms related to this.  Outpatient Medications Prior to Visit  Medication Sig Dispense Refill  . apixaban (ELIQUIS) 5 MG TABS tablet Take 1 tablet (5 mg total) by mouth 2 (two) times daily. 180 tablet 1  . atorvastatin (LIPITOR) 20 MG tablet TAKE 1 TABLET BY MOUTH EVERY DAY 90 tablet 1  . cyanocobalamin 1000 MCG tablet Take 1,000 mcg by mouth daily.     . dapsone 100 MG tablet TAKE 1/2 TABLET BY MOUTH EVERY DAY 45 tablet 0  . furosemide (LASIX) 20 MG tablet Take 1 tablet (20 mg total) by mouth daily. 90 tablet 3  . metoprolol succinate (TOPROL-XL) 25 MG 24 hr tablet TAKE 1 TABLET BY MOUTH EVERY DAY 90 tablet 1  . levothyroxine (SYNTHROID) 50 MCG tablet TAKE 1 TABLET BY MOUTH EVERY DAY 90 tablet 0   No facility-administered medications prior to visit.     ROS Review of Systems  Constitutional: Negative for diaphoresis, fatigue and unexpected weight change.  HENT: Negative.   Eyes: Negative.   Respiratory: Negative for cough, chest tightness, shortness of breath and wheezing.   Cardiovascular: Negative for chest pain, palpitations and leg swelling.  Gastrointestinal: Negative for abdominal pain, constipation, diarrhea, nausea and vomiting.  Endocrine: Negative for cold intolerance and heat intolerance.  Genitourinary: Negative.  Negative for difficulty urinating.  Musculoskeletal: Negative.  Negative for arthralgias and myalgias.  Skin: Negative.  Negative for color change and pallor.  Neurological: Negative.  Negative for dizziness, weakness, light-headedness and numbness.  Hematological: Negative for adenopathy. Does not bruise/bleed easily.  Psychiatric/Behavioral: Negative.     Objective:  BP 130/70 (BP Location: Left  Arm, Patient Position: Sitting, Cuff Size: Normal)   Pulse 78   Temp 98.2 F (36.8 C) (Oral)   Resp 16   Ht 6' 1"  (1.854 m)   Wt 166 lb (75.3 kg)   SpO2 96%   BMI 21.90 kg/m   BP Readings from Last 3 Encounters:  10/24/19 130/70  01/02/19 120/68  10/10/18 (!) 102/56    Wt Readings from Last 3 Encounters:  10/24/19 166 lb (75.3 kg)  07/27/19 160 lb (72.6 kg)  01/02/19 160 lb 12.8 oz (72.9 kg)    Physical Exam Vitals signs reviewed.  Constitutional:      Appearance: Normal appearance.  HENT:     Nose: Nose normal.     Mouth/Throat:     Pharynx: Oropharynx is clear.  Eyes:     General: No scleral icterus.    Conjunctiva/sclera: Conjunctivae normal.  Neck:     Musculoskeletal: Normal range of motion and neck supple.  Cardiovascular:     Rate and Rhythm: Normal rate. Rhythm irregularly irregular.     Heart sounds: Murmur present. Systolic murmur present with a grade of 2/6. No diastolic murmur.  Pulmonary:     Effort: Pulmonary effort is normal.     Breath sounds: No stridor. No wheezing, rhonchi or rales.  Abdominal:     General: Abdomen is flat. Bowel sounds are normal.     Palpations: Abdomen is soft. There is no hepatomegaly or splenomegaly.     Tenderness: There is no abdominal tenderness.  Genitourinary:    Comments: GU  and rectal exams were deferred at his request. Musculoskeletal:     Right lower leg: No edema.     Left lower leg: No edema.  Lymphadenopathy:     Cervical: No cervical adenopathy.  Skin:    General: Skin is warm and dry.     Coloration: Skin is not pale.  Neurological:     General: No focal deficit present.     Mental Status: He is alert and oriented to person, place, and time. Mental status is at baseline.  Psychiatric:        Mood and Affect: Mood normal.        Behavior: Behavior normal.     Lab Results  Component Value Date   WBC 6.6 10/24/2019   HGB 11.6 (L) 10/24/2019   HCT 35.1 (L) 10/24/2019   PLT 182.0 10/24/2019    GLUCOSE 89 09/21/2019   CHOL 118 09/21/2019   TRIG 99 09/21/2019   HDL 42 09/21/2019   LDLCALC 57 09/21/2019   ALT 38 09/21/2019   AST 50 (H) 09/21/2019   NA 137 09/21/2019   K 4.6 09/21/2019   CL 102 09/21/2019   CREATININE 0.79 09/21/2019   BUN 20 09/21/2019   CO2 24 09/21/2019   TSH 7.23 (H) 10/24/2019   PSA 0.26 07/13/2013   INR 1.10 07/13/2018    No results found.  Assessment & Plan:   [name] [name] was seen today for annual exam, hypothyroidism and anemia.  Diagnoses and all orders for this visit:  Macrocytic anemia- His H&H are stable and he has no sources of blood loss.  He is asymptomatic with this.  I will monitor him for vitamin deficiencies in light of his history of celiac disease. -     CBC with Differential/Platelet; Future -     Vitamin B12; Future -     IBC panel; Future -     Folate; Future -     Ferritin; Future -     Vitamin B1; Future  Routine adult health maintenance- Exam completed, labs reviewed, vaccines reviewed and updated, no cancer screenings are indicated, patient education was given.  Permanent atrial fibrillation ([name] [name])- He is maintaining adequate rate control.  Will continue anticoagulation with Eliquis.  Acquired hypothyroidism- His TSH is elevated at 7.23.  Will restart his levothyroxine at the previous dose. -     TSH; Future  Celiac disease  Need for pneumococcal vaccination -     Pneumococcal polysaccharide vaccine 23-valent greater than or equal to 2yo subcutaneous/IM  Other specified hypothyroidism -     levothyroxine (SYNTHROID) 50 MCG tablet; Take 1 tablet (50 mcg total) by mouth daily.   I have changed [name] [name]'s levothyroxine. I am also having him maintain his cyanocobalamin, furosemide, atorvastatin, dapsone, apixaban, and metoprolol succinate.  Meds ordered this encounter  Medications  . levothyroxine (SYNTHROID) 50 MCG tablet    Sig: Take 1 tablet (50 mcg total) by mouth daily.    Dispense:  90 tablet    Refill:   1     Follow-up: Return in about 6 months (around 04/22/2020).  [name] [place], MD

## 2019-10-26 ENCOUNTER — Other Ambulatory Visit: Payer: Self-pay | Admitting: Internal Medicine

## 2019-10-26 DIAGNOSIS — E785 Hyperlipidemia, unspecified: Secondary | ICD-10-CM

## 2019-10-28 LAB — VITAMIN B1: Vitamin B1 (Thiamine): 17 nmol/L (ref 8–30)

## 2019-10-30 DIAGNOSIS — C44329 Squamous cell carcinoma of skin of other parts of face: Secondary | ICD-10-CM | POA: Diagnosis not present

## 2019-10-30 DIAGNOSIS — L821 Other seborrheic keratosis: Secondary | ICD-10-CM | POA: Diagnosis not present

## 2019-10-30 DIAGNOSIS — Z85828 Personal history of other malignant neoplasm of skin: Secondary | ICD-10-CM | POA: Diagnosis not present

## 2019-10-30 DIAGNOSIS — D0439 Carcinoma in situ of skin of other parts of face: Secondary | ICD-10-CM | POA: Diagnosis not present

## 2019-10-30 DIAGNOSIS — L570 Actinic keratosis: Secondary | ICD-10-CM | POA: Diagnosis not present

## 2019-11-03 ENCOUNTER — Other Ambulatory Visit: Payer: Self-pay | Admitting: Internal Medicine

## 2019-11-03 DIAGNOSIS — K900 Celiac disease: Secondary | ICD-10-CM

## 2019-11-06 NOTE — Progress Notes (Addendum)
Subjective:   [name] is a 85 y.o. male who presents for Medicare Annual/Subsequent preventive examination. I connected with patient by a telephone and verified that I am speaking with the correct person using two identifiers. Patient stated full name and DOB. Patient gave permission to continue with telephonic visit. Patient's location was at home and Nurse's location was at [place] office. Participants during this visit included patient and nurse.  Review of Systems:   Cardiac Risk Factors include: advanced age (>25mn, >>32women) Sleep patterns: feels rested on waking, gets up 1-2 times nightly to void and sleeps 6-7 hours nightly.    Home Safety/Smoke Alarms: Feels safe in home. Smoke alarms in place.  Living environment; residence and Firearm Safety: 1-story house/ trailer, equipment: CRadio producer Type: Single PGeneral Millsand Walkers, Type: RConservation officer, nature no firearms, firearms stored safely. Lives alone, no needs for DME, good support system Seat Belt Safety/Bike Helmet: Wears seat belt.      Objective:    Vitals: There were no vitals taken for this visit.  There is no height or weight on file to calculate BMI.  Advanced Directives 11/07/2019 10/10/2018 07/11/2018 02/26/2016  Does Patient Have a Medical Advance Directive? Yes Yes Yes Yes  Type of AParamedicof AFoxfireLiving will HKenaiLiving will HWhitehawkLiving will HBreckinridgeLiving will  Does patient want to make changes to medical advance directive? - - No - Patient declined No - Patient declined  Copy of HHarriettain Chart? No - copy requested No - copy requested No - copy requested Yes    Tobacco Social History   Tobacco Use  Smoking Status Never Smoker  Smokeless Tobacco Never Used     Counseling given: Not Answered  Past Medical History:  Diagnosis Date  . Alcohol abuse   . Allergic dermatitis   . [place]   . Celiac disease   . Constipation   . CVA (cerebral infarction) 1987   blood clot, right side and speech issues, recovered quickly  . DJD (degenerative joint disease)   . Macrocytic anemia   . Nonspecific (abnormal) findings on radiological and other examination of other intrathoracic organs   . Nonspecific abnormal results of thyroid function study   . Olecranon bursitis   . [place]   . Pure hypercholesterolemia   . Unspecified hemorrhoids without mention of complication    Past Surgical History:  Procedure Laterality Date  . COLONOSCOPY    . KYPHOPLASTY N/A 07/13/2018   Procedure: KYPHOPLASTY L2, L3, L5;  Surgeon: BMelina Schools MD;  [facility]  Service: Orthopedics;  Laterality: N/A;  120 mins  . prostate biopsy and see implantation    . WRIST FRACTURE SURGERY     Family History  Problem Relation Age of Onset  . Heart disease Other        No family history   Social History   Socioeconomic History  . Marital status: Widowed    Spouse name: Not on file  . Number of children: 3  . Years of education: Not on file  . Highest education level: Not on file  Occupational History  . 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 Financial resource strain: Not hard at all  . Food insecurity    Worry: Never true    Inability: Never true  . Transportation needs    Medical: No    Non-medical: No  Tobacco Use  . Smoking status: Never Smoker  . Smokeless tobacco: Never Used  Substance and Sexual Activity  . Alcohol use: Yes    Comment: occasional (heavy drinker in the past)  . Drug use: Never  . Sexual activity: Never  Lifestyle  . Physical activity    Days per week: 0 days    Minutes per session: 0 min  . Stress: Not at all  Relationships  . Social connections    Talks on phone: More than three times a week    Gets together: More than three times a week    Attends religious service: 1 to 4 times per year    Active  member of club or organization: Yes    Attends meetings of clubs or organizations: 1 to 4 times per year    Relationship status: Widowed  Other Topics Concern  . Not on file  Social History Narrative  . Not on file    Outpatient Encounter Medications as of 11/07/2019  Medication Sig  . apixaban (ELIQUIS) 5 MG TABS tablet Take 1 tablet (5 mg total) by mouth 2 (two) times daily.  Marland Kitchen atorvastatin (LIPITOR) 20 MG tablet TAKE 1 TABLET BY MOUTH EVERY DAY  . cyanocobalamin 1000 MCG tablet Take 1,000 mcg by mouth daily.   . dapsone 100 MG tablet TAKE 1/2 TABLET BY MOUTH EVERY DAY  . levothyroxine (SYNTHROID) 50 MCG tablet Take 1 tablet (50 mcg total) by mouth daily.  . metoprolol succinate (TOPROL-XL) 25 MG 24 hr tablet TAKE 1 TABLET BY MOUTH EVERY DAY  . furosemide (LASIX) 20 MG tablet Take 1 tablet (20 mg total) by mouth daily.   No facility-administered encounter medications on file as of 11/07/2019.     Activities of Daily Living In your present state of health, do you have any difficulty performing the following activities: 11/07/2019  Hearing? N  Vision? N  Difficulty concentrating or making decisions? N  Walking or climbing stairs? N  Dressing or bathing? N  Doing errands, shopping? N  Preparing Food and eating ? N  Using the Toilet? N  In the past six months, have you accidently leaked urine? N  Do you have problems with loss of bowel control? N  Managing your Medications? N  Managing your Finances? N  Housekeeping or managing your Housekeeping? N  Some recent data might be hidden    Patient Care Team: [name], MD as PCP - General (Internal Medicine) Stanford Breed Denice Bors, MD as PCP - Cardiology (Cardiology) Melina Schools, MD as Consulting Physician (Orthopedic Surgery)   Assessment:   This is a routine wellness examination for [name]. Physical assessment deferred to PCP.  Exercise Activities and Dietary recommendations Current Exercise Habits: The patient does  not participate in regular exercise at present, Exercise limited by: orthopedic condition(s)  Diet (meal preparation, eat out, water intake, caffeinated beverages, dairy products, fruits and vegetables): in general, a "healthy" diet   Patient states that he eats less than he used to but he feels that he is getting adequate nutrition.   Reviewed heart healthy diet. Encouraged patient to increase daily water and healthy fluid intake.  Goals    . Patient Stated     Stay as healthy and as independent as possible.       Fall Risk Fall Risk  11/07/2019 10/24/2019 10/10/2018 07/20/2018 03/24/2017  Falls in the past year? 0 0 No No Yes  Comment - - - Emmi Telephone Survey: data to providers prior to  load -  Number falls in past yr: 0 0 - - 2 or more  Injury with Fall? 0 0 - - No  Risk for fall due to : Impaired mobility;Impaired balance/gait Impaired mobility;Impaired balance/gait Impaired mobility;Impaired balance/gait - -  Follow up Falls prevention discussed Falls evaluation completed - - -   Is the patient's home free of loose throw rugs in walkways, pet beds, electrical cords, etc?   yes      Grab bars in the bathroom? yes      Handrails on the stairs?   yes      Adequate lighting?   yes   Depression Screen PHQ 2/9 Scores 11/07/2019 10/24/2019 10/10/2018 03/24/2017  PHQ - 2 Score 2 0 1 0  PHQ- 9 Score 3 - - -    Cognitive Function MMSE - Mini Mental State Exam 10/10/2018  Orientation to time 5  Orientation to Place 5  Registration 3  Attention/ Calculation 5  Recall 1  Language- name 2 objects 2  Language- repeat 1  Language- follow 3 step command 3  Language- read & follow direction 1  Write a sentence 1  Copy design 1  Total score 28     6CIT Screen 11/07/2019  What Year? 0 points  What month? 0 points  What time? 0 points  Count back from 20 0 points  Months in reverse 0 points  Repeat phrase 2 points  Total Score 2    Immunization History  Administered Date(s)  Administered  . Influenza Split 10/09/2011, 09/24/2013, 10/12/2019  . Influenza, High Dose Seasonal PF 10/21/2017, 10/10/2018  . Influenza,inj,Quad PF,6+ Mos 10/03/2014, 10/25/2016  . Influenza-Unspecified 09/20/2015  . Pneumococcal Conjugate-13 10/03/2014  . Pneumococcal Polysaccharide-23 06/07/2012, 10/24/2019  . Tdap 12/20/2009   Screening Tests Health Maintenance  Topic Date Due  . TETANUS/TDAP  12/21/2019  . INFLUENZA VACCINE  Completed  . PNA vac Low Risk Adult  Completed      Plan:    Reviewed health maintenance screenings with patient today and relevant education, vaccines, and/or referrals were provided.   Continue to eat heart healthy diet (full of fruits, vegetables, whole grains, lean protein, water--limit salt, fat, and sugar intake) and increase physical activity as tolerated.  Continue doing brain stimulating activities (puzzles, reading, adult coloring books, staying active) to keep memory sharp.   I have personally reviewed and noted the following in the patient's chart:   . Medical and social history . Use of alcohol, tobacco or illicit drugs  . Current medications and supplements . Functional ability and status . Nutritional status . Physical activity . Advanced directives . List of other physicians . Screenings to include cognitive, depression, and falls . Referrals and appointments  In addition, I have reviewed and discussed with patient certain preventive protocols, quality metrics, and best practice recommendations. A written personalized care plan for preventive services as well as general preventive health recommendations were provided to patient.     Michiel Cowboy, RN  11/07/2019  Medical screening examination/treatment/procedure(s) were performed by non-physician practitioner and as supervising physician I was immediately available for consultation/collaboration. I agree with above. [name], MD

## 2019-11-07 ENCOUNTER — Ambulatory Visit (INDEPENDENT_AMBULATORY_CARE_PROVIDER_SITE_OTHER): Payer: Medicare Other | Admitting: *Deleted

## 2019-11-07 ENCOUNTER — Other Ambulatory Visit: Payer: Self-pay

## 2019-11-07 DIAGNOSIS — Z0000 Encounter for general adult medical examination without abnormal findings: Secondary | ICD-10-CM | POA: Diagnosis not present

## 2019-11-14 ENCOUNTER — Other Ambulatory Visit: Payer: Self-pay

## 2019-12-27 ENCOUNTER — Other Ambulatory Visit: Payer: Self-pay | Admitting: Cardiology

## 2019-12-27 DIAGNOSIS — R609 Edema, unspecified: Secondary | ICD-10-CM

## 2019-12-31 DIAGNOSIS — L570 Actinic keratosis: Secondary | ICD-10-CM | POA: Diagnosis not present

## 2019-12-31 DIAGNOSIS — Z85828 Personal history of other malignant neoplasm of skin: Secondary | ICD-10-CM | POA: Diagnosis not present

## 2019-12-31 DIAGNOSIS — C44329 Squamous cell carcinoma of skin of other parts of face: Secondary | ICD-10-CM | POA: Diagnosis not present

## 2020-01-01 ENCOUNTER — Other Ambulatory Visit: Payer: Self-pay

## 2020-01-01 ENCOUNTER — Ambulatory Visit (HOSPITAL_COMMUNITY): Payer: Medicare Other | Attending: Cardiology

## 2020-01-01 DIAGNOSIS — I059 Rheumatic mitral valve disease, unspecified: Secondary | ICD-10-CM | POA: Diagnosis not present

## 2020-01-30 NOTE — Progress Notes (Deleted)
HPI: FU atrial fibrillation, MR and TR. Patient had electrocardiogram on 07/20/2013 that showed atrial fibrillation. We treated with rate control/anticoagulation. Lower ext dopplers 3/18 show no DVT. Patient developed pedal edema previously and was treated with diuretics. His edema resolved but he developed orthostasis followed by syncope.We are treating his valvular heart disease conservatively given his age.    Echocardiogram January 2021 showed normal LV function, mild left ventricular enlargement, severe biatrial enlargement, mild mitral regurgitation; tricuspid regurgitation; mildly dilated aortic root at 40 mm.  Since last seen,  Current Outpatient Medications  Medication Sig Dispense Refill  . apixaban (ELIQUIS) 5 MG TABS tablet Take 1 tablet (5 mg total) by mouth 2 (two) times daily. 180 tablet 1  . atorvastatin (LIPITOR) 20 MG tablet TAKE 1 TABLET BY MOUTH EVERY DAY 90 tablet 1  . cyanocobalamin 1000 MCG tablet Take 1,000 mcg by mouth daily.     . dapsone 100 MG tablet TAKE 1/2 TABLET BY MOUTH EVERY DAY 45 tablet 1  . furosemide (LASIX) 20 MG tablet TAKE 1 TABLET BY MOUTH EVERY DAY 90 tablet 3  . levothyroxine (SYNTHROID) 50 MCG tablet Take 1 tablet (50 mcg total) by mouth daily. 90 tablet 1  . metoprolol succinate (TOPROL-XL) 25 MG 24 hr tablet TAKE 1 TABLET BY MOUTH EVERY DAY 90 tablet 1   No current facility-administered medications for this visit.     Past Medical History:  Diagnosis Date  . Alcohol abuse   . Allergic dermatitis   . Atrial fibrillation ([place])   . Celiac disease   . Constipation   . CVA (cerebral infarction) 1987   blood clot, right side and speech issues, recovered quickly  . DJD (degenerative joint disease)   . Macrocytic anemia   . Nonspecific (abnormal) findings on radiological and other examination of other intrathoracic organs   . Nonspecific abnormal results of thyroid function study   . Olecranon bursitis   . Prostate cancer ([place])   .  Pure hypercholesterolemia   . Unspecified hemorrhoids without mention of complication     Past Surgical History:  Procedure Laterality Date  . COLONOSCOPY    . KYPHOPLASTY N/A 07/13/2018   Procedure: KYPHOPLASTY L2, L3, L5;  Surgeon: Melina Schools, MD;  Location: [facility];  Service: Orthopedics;  Laterality: N/A;  120 mins  . prostate biopsy and see implantation    . WRIST FRACTURE SURGERY      Social History   Socioeconomic History  . Marital status: Widowed    Spouse name: Not on file  . Number of children: 3  . Years of education: Not on file  . Highest education level: Not on file  Occupational History  . 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 Smoking status: Never Smoker  . Smokeless tobacco: Never Used  Substance and Sexual Activity  . Alcohol use: Yes    Comment: occasional (heavy drinker in the past)  . Drug use: Never  . Sexual activity: Never  Other Topics Concern  . Not on file  Social History Narrative  . Not on file   Social Determinants of Health   Financial Resource Strain:   . Difficulty of Paying Living Expenses: Not on file  Food Insecurity:   . Worried About Charity fundraiser in the Last Year: Not on file  . Ran Out of Food in the Last Year: Not on file  Transportation Needs:   . Lack of Transportation (Medical): Not on file  .  Lack of Transportation (Non-Medical): Not on file  Physical Activity:   . Days of Exercise per Week: Not on file  . Minutes of Exercise per Session: Not on file  Stress:   . Feeling of Stress : Not on file  Social Connections: Unknown  . Frequency of Communication with Friends and Family: Not on file  . Frequency of Social Gatherings with Friends and Family: Not on file  . Attends Religious Services: Not on file  . Active Member of Clubs or Organizations: Not on file  . Attends Archivist Meetings: 1 to 4 times per year  . Marital Status: Widowed  Intimate Partner Violence: Unknown  . Fear  of Current or Ex-Partner: Not asked  . Emotionally Abused: Not asked  . Physically Abused: Not asked  . Sexually Abused: Not asked    Family History  Problem Relation Age of Onset  . Heart disease Other        No family history    ROS: no fevers or chills, productive cough, hemoptysis, dysphasia, odynophagia, melena, hematochezia, dysuria, hematuria, rash, seizure activity, orthopnea, PND, pedal edema, claudication. Remaining systems are negative.  Physical Exam: Well-developed well-nourished in no acute distress.  Skin is warm and dry.  HEENT is normal.  Neck is supple.  Chest is clear to auscultation with normal expansion.  Cardiovascular exam is regular rate and rhythm.  Abdominal exam nontender or distended. No masses palpated. Extremities show no edema. neuro grossly intact  ECG- personally reviewed  A/P  1 history of moderate mitral and severe tricuspid regurgitation-appeared mild and moderate respectively on recent echocardiogram.  Continue Lasix at present dose.  We will plan follow-up echoes in the future.  I would like to treat conservatively given patient's age.  2 permanent atrial fibrillation-continue beta-blocker at present dose.  Continue apixaban.  3 hyperlipidemia-continue statin.  Kirk Ruths, MD

## 2020-02-08 ENCOUNTER — Ambulatory Visit: Payer: Medicare Other | Admitting: Cardiology

## 2020-03-11 ENCOUNTER — Other Ambulatory Visit: Payer: Self-pay | Admitting: Cardiology

## 2020-03-25 ENCOUNTER — Other Ambulatory Visit: Payer: Self-pay | Admitting: Cardiology

## 2020-04-08 NOTE — Progress Notes (Signed)
HPI: FU atrial fibrillation, MR and TR. Patient had electrocardiogram on 07/20/2013 that showed atrial fibrillation. We treated with rate control/anticoagulation. Patient developed pedal edema previously and was treated with diuretics. His edema resolved but he developed orthostasis followed by syncope.We are treating his valvular heart disease conservatively given his age. Last echocardiogram January 2021 showed normal LV function, mild LVH, mild mitral regurgitation, severe biatrial enlargement, moderate TR, mildly dilated aortic root at 40 mm.  Since last seen,patient denies dyspnea, chest pain, palpitations.  He had one dizzy spell but has not had syncope.  Current Outpatient Medications  Medication Sig Dispense Refill  . atorvastatin (LIPITOR) 20 MG tablet TAKE 1 TABLET BY MOUTH EVERY DAY 90 tablet 1  . cyanocobalamin 1000 MCG tablet Take 1,000 mcg by mouth daily.     . dapsone 100 MG tablet TAKE 1/2 TABLET BY MOUTH EVERY DAY 45 tablet 1  . ELIQUIS 5 MG TABS tablet TAKE 1 TABLET BY MOUTH TWICE A DAY 180 tablet 1  . furosemide (LASIX) 20 MG tablet TAKE 1 TABLET BY MOUTH EVERY DAY 90 tablet 3  . levothyroxine (SYNTHROID) 50 MCG tablet Take 1 tablet (50 mcg total) by mouth daily. 90 tablet 1  . metoprolol succinate (TOPROL-XL) 25 MG 24 hr tablet TAKE 1 TABLET BY MOUTH EVERY DAY 90 tablet 1   No current facility-administered medications for this visit.     Past Medical History:  Diagnosis Date  . Alcohol abuse   . Allergic dermatitis   . Atrial fibrillation ([place])   . Celiac disease   . Constipation   . CVA (cerebral infarction) 1987   blood clot, right side and speech issues, recovered quickly  . DJD (degenerative joint disease)   . Macrocytic anemia   . Nonspecific (abnormal) findings on radiological and other examination of other intrathoracic organs   . Nonspecific abnormal results of thyroid function study   . Olecranon bursitis   . Prostate cancer ([place])   . Pure  hypercholesterolemia   . Unspecified hemorrhoids without mention of complication     Past Surgical History:  Procedure Laterality Date  . COLONOSCOPY    . KYPHOPLASTY N/A 07/13/2018   Procedure: KYPHOPLASTY L2, L3, L5;  Surgeon: Melina Schools, MD;  Location: [facility];  Service: Orthopedics;  Laterality: N/A;  120 mins  . prostate biopsy and see implantation    . WRIST FRACTURE SURGERY      Social History   Socioeconomic History  . Marital status: Widowed    Spouse name: Not on file  . Number of children: 3  . Years of education: Not on file  . Highest education level: Not on file  Occupational History  . 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 Smoking status: Never Smoker  . Smokeless tobacco: Never Used  Substance and Sexual Activity  . Alcohol use: Yes    Comment: occasional (heavy drinker in the past)  . Drug use: Never  . Sexual activity: Never  Other Topics Concern  . Not on file  Social History Narrative  . Not on file   Social Determinants of Health   Financial Resource Strain:   . Difficulty of Paying Living Expenses:   Food Insecurity:   . Worried About Charity fundraiser in the Last Year:   . Arboriculturist in the Last Year:   Transportation Needs:   . Film/video editor (Medical):   Marland Kitchen Lack of Transportation (Non-Medical):  Physical Activity:   . Days of Exercise per Week:   . Minutes of Exercise per Session:   Stress:   . Feeling of Stress :   Social Connections: Unknown  . Frequency of Communication with Friends and Family: Not on file  . Frequency of Social Gatherings with Friends and Family: Not on file  . Attends Religious Services: Not on file  . Active Member of Clubs or Organizations: Not on file  . Attends Archivist Meetings: 1 to 4 times per year  . Marital Status: Widowed  Intimate Partner Violence: Unknown  . Fear of Current or Ex-Partner: Not asked  . Emotionally Abused: Not asked  . Physically Abused:  Not asked  . Sexually Abused: Not asked    Family History  Problem Relation Age of Onset  . Heart disease Other        No family history    ROS: no fevers or chills, productive cough, hemoptysis, dysphasia, odynophagia, melena, hematochezia, dysuria, hematuria, rash, seizure activity, orthopnea, PND, pedal edema, claudication. Remaining systems are negative.  Physical Exam: Well-developed well-nourished in no acute distress.  Skin is warm and dry.  HEENT is normal.  Neck is supple.  Chest is clear to auscultation with normal expansion.  Cardiovascular exam is irregular Abdominal exam nontender or distended. No masses palpated. Extremities show no edema. neuro grossly intact  ECG-atrial fibrillation at a rate of 77, limb lead reversal, cannot rule out anterior infarct, inferior T wave inversion.  Personally reviewed  A/P  1 mitral regurgitation/tricuspid regurgitation-mitral regurgitation is mild on most recent echocardiogram and tricuspid regurgitation moderate.  Given age and patient preference we are treating conservatively.  Continue Lasix.    2 permanent atrial fibrillation-continue beta-blocker and apixaban.  3 hyperlipidemia-continue statin.  Kirk Ruths, MD

## 2020-04-14 ENCOUNTER — Other Ambulatory Visit: Payer: Self-pay

## 2020-04-14 ENCOUNTER — Ambulatory Visit (INDEPENDENT_AMBULATORY_CARE_PROVIDER_SITE_OTHER): Payer: Medicare Other | Admitting: Cardiology

## 2020-04-14 ENCOUNTER — Encounter: Payer: Self-pay | Admitting: Cardiology

## 2020-04-14 VITALS — BP 118/60 | HR 77 | Ht 73.0 in | Wt 160.0 lb

## 2020-04-14 DIAGNOSIS — I059 Rheumatic mitral valve disease, unspecified: Secondary | ICD-10-CM

## 2020-04-14 DIAGNOSIS — E7800 Pure hypercholesterolemia, unspecified: Secondary | ICD-10-CM | POA: Diagnosis not present

## 2020-04-14 DIAGNOSIS — I4821 Permanent atrial fibrillation: Secondary | ICD-10-CM | POA: Diagnosis not present

## 2020-04-14 NOTE — Patient Instructions (Signed)
Medication Instructions:  NO CHANGE *If you need a refill on your cardiac medications before your next appointment, please call your pharmacy*   Lab Work: If you have labs (blood work) drawn today and your tests are completely normal, you will receive your results only by: Marland Kitchen MyChart Message (if you have MyChart) OR . A paper copy in the mail If you have any lab test that is abnormal or we need to change your treatment, we will call you to review the results.   Follow-Up: At [facility], you and your health needs are our priority.  As part of our continuing mission to provide you with exceptional heart care, we have created designated Provider Care Teams.  These Care Teams include your primary Cardiologist (physician) and Advanced Practice Providers (APPs -  Physician Assistants and Nurse Practitioners) who all work together to provide you with the care you need, when you need it.  We recommend signing up for the patient portal called "MyChart".  Sign up information is provided on this After Visit Summary.  MyChart is used to connect with patients for Virtual Visits (Telemedicine).  Patients are able to view lab/test results, encounter notes, upcoming appointments, etc.  Non-urgent messages can be sent to your provider as well.   To learn more about what you can do with MyChart, go to [email or web address].    Your next appointment:   6 month(s)  The format for your next appointment:   Either In Person or Virtual  Provider:   You may see Kirk Ruths, MD or one of the following Advanced Practice Providers on your designated Care Team:    Kerin Ransom, PA-C  [place], [place]  Coletta Memos, [place]

## 2020-04-16 ENCOUNTER — Other Ambulatory Visit: Payer: Self-pay | Admitting: Internal Medicine

## 2020-04-16 DIAGNOSIS — E038 Other specified hypothyroidism: Secondary | ICD-10-CM

## 2020-04-19 ENCOUNTER — Other Ambulatory Visit: Payer: Self-pay | Admitting: Internal Medicine

## 2020-04-19 DIAGNOSIS — E785 Hyperlipidemia, unspecified: Secondary | ICD-10-CM

## 2020-05-04 ENCOUNTER — Other Ambulatory Visit: Payer: Self-pay | Admitting: Internal Medicine

## 2020-05-04 DIAGNOSIS — K900 Celiac disease: Secondary | ICD-10-CM

## 2020-09-04 ENCOUNTER — Other Ambulatory Visit: Payer: Self-pay | Admitting: Cardiology

## 2020-09-25 ENCOUNTER — Other Ambulatory Visit: Payer: Self-pay | Admitting: Cardiology

## 2020-10-14 ENCOUNTER — Other Ambulatory Visit: Payer: Self-pay | Admitting: Internal Medicine

## 2020-10-14 DIAGNOSIS — E785 Hyperlipidemia, unspecified: Secondary | ICD-10-CM

## 2020-10-16 DIAGNOSIS — Z23 Encounter for immunization: Secondary | ICD-10-CM | POA: Diagnosis not present

## 2020-11-06 ENCOUNTER — Other Ambulatory Visit: Payer: Self-pay | Admitting: Internal Medicine

## 2020-11-06 DIAGNOSIS — K900 Celiac disease: Secondary | ICD-10-CM

## 2020-11-25 ENCOUNTER — Other Ambulatory Visit: Payer: Self-pay

## 2020-11-25 ENCOUNTER — Ambulatory Visit (INDEPENDENT_AMBULATORY_CARE_PROVIDER_SITE_OTHER): Payer: Medicare Other | Admitting: Internal Medicine

## 2020-11-25 ENCOUNTER — Encounter: Payer: Self-pay | Admitting: Internal Medicine

## 2020-11-25 VITALS — BP 116/70 | HR 63 | Temp 97.4°F | Resp 16 | Ht 68.0 in | Wt 152.8 lb

## 2020-11-25 DIAGNOSIS — Z8546 Personal history of malignant neoplasm of prostate: Secondary | ICD-10-CM | POA: Insufficient documentation

## 2020-11-25 DIAGNOSIS — K900 Celiac disease: Secondary | ICD-10-CM

## 2020-11-25 DIAGNOSIS — E038 Other specified hypothyroidism: Secondary | ICD-10-CM

## 2020-11-25 DIAGNOSIS — E785 Hyperlipidemia, unspecified: Secondary | ICD-10-CM

## 2020-11-25 DIAGNOSIS — N390 Urinary tract infection, site not specified: Secondary | ICD-10-CM

## 2020-11-25 DIAGNOSIS — R300 Dysuria: Secondary | ICD-10-CM | POA: Diagnosis not present

## 2020-11-25 DIAGNOSIS — I4821 Permanent atrial fibrillation: Secondary | ICD-10-CM | POA: Diagnosis not present

## 2020-11-25 DIAGNOSIS — D539 Nutritional anemia, unspecified: Secondary | ICD-10-CM

## 2020-11-25 LAB — CBC WITH DIFFERENTIAL/PLATELET
Basophils Absolute: 0 10*3/uL (ref 0.0–0.1)
Basophils Relative: 0.3 % (ref 0.0–3.0)
Eosinophils Absolute: 0.1 10*3/uL (ref 0.0–0.7)
Eosinophils Relative: 0.9 % (ref 0.0–5.0)
HCT: 34.8 % — ABNORMAL LOW (ref 39.0–52.0)
Hemoglobin: 11.6 g/dL — ABNORMAL LOW (ref 13.0–17.0)
Lymphocytes Relative: 34.2 % (ref 12.0–46.0)
Lymphs Abs: 2.3 10*3/uL (ref 0.7–4.0)
MCHC: 33.3 g/dL (ref 30.0–36.0)
MCV: 103.9 fl — ABNORMAL HIGH (ref 78.0–100.0)
Monocytes Absolute: 0.6 10*3/uL (ref 0.1–1.0)
Monocytes Relative: 9.1 % (ref 3.0–12.0)
Neutro Abs: 3.7 10*3/uL (ref 1.4–7.7)
Neutrophils Relative %: 55.5 % (ref 43.0–77.0)
Platelets: 213 10*3/uL (ref 150.0–400.0)
RBC: 3.35 Mil/uL — ABNORMAL LOW (ref 4.22–5.81)
RDW: 13.7 % (ref 11.5–15.5)
WBC: 6.7 10*3/uL (ref 4.0–10.5)

## 2020-11-25 LAB — HEPATIC FUNCTION PANEL
ALT: 33 U/L (ref 0–53)
AST: 42 U/L — ABNORMAL HIGH (ref 0–37)
Albumin: 4.1 g/dL (ref 3.5–5.2)
Alkaline Phosphatase: 97 U/L (ref 39–117)
Bilirubin, Direct: 0.2 mg/dL (ref 0.0–0.3)
Total Bilirubin: 1 mg/dL (ref 0.2–1.2)
Total Protein: 8.2 g/dL (ref 6.0–8.3)

## 2020-11-25 LAB — URINALYSIS, ROUTINE W REFLEX MICROSCOPIC
Bilirubin Urine: NEGATIVE
Ketones, ur: NEGATIVE
Nitrite: POSITIVE — AB
Specific Gravity, Urine: 1.02 (ref 1.000–1.030)
Total Protein, Urine: 30 — AB
Urine Glucose: NEGATIVE
Urobilinogen, UA: 1 (ref 0.0–1.0)
pH: 6.5 (ref 5.0–8.0)

## 2020-11-25 LAB — BASIC METABOLIC PANEL
BUN: 22 mg/dL (ref 6–23)
CO2: 29 mEq/L (ref 19–32)
Calcium: 9.4 mg/dL (ref 8.4–10.5)
Chloride: 99 mEq/L (ref 96–112)
Creatinine, Ser: 0.87 mg/dL (ref 0.40–1.50)
GFR: 77.17 mL/min (ref >60.00)
Glucose, Bld: 89 mg/dL (ref 70–99)
Potassium: 4.6 mEq/L (ref 3.5–5.1)
Sodium: 135 mEq/L (ref 135–145)

## 2020-11-25 LAB — VITAMIN B12: Vitamin B-12: 343 pg/mL (ref 211–911)

## 2020-11-25 LAB — FOLATE: Folate: 8.2 ng/mL (ref >5.9)

## 2020-11-25 LAB — TSH: TSH: 5.86 u[IU]/mL — ABNORMAL HIGH (ref 0.35–4.50)

## 2020-11-25 LAB — PSA: PSA: 0 ng/mL — ABNORMAL LOW (ref 0.10–4.00)

## 2020-11-25 MED ORDER — DAPSONE 100 MG PO TABS: 50.0000 mg | ORAL_TABLET | Freq: Every day | ORAL | 1 refills | Status: DC

## 2020-11-25 NOTE — Progress Notes (Signed)
Subjective:  Patient ID: [name], male    DOB: 03/04/1930  Age: 86 y.o. MRN: [id number]  CC: Hypothyroidism, Anemia, and Atrial Fibrillation  This visit occurred during the SARS-CoV-2 public health emergency.  Safety protocols were in place, including screening questions prior to the visit, additional usage of staff PPE, and extensive cleaning of exam room while observing appropriate contact time as indicated for disinfecting solutions.    HPI [name] presents for f/up - He complains of a several week history of dysuria and urinary urgency.  He denies abdominal pain, flank pain, hematuria, penile discharge, fever, chills, testicular pain or swelling, or lymphadenopathy.  He continues to consume gluten but denies any recent episodes of rash or diarrhea.  He feels like his thyroid dose is in the adequate range.  He denies changes in his weight, sleeping habits, bowel movements, skin, or energy level.  He does not experience edema, chest pain, shortness of breath, or palpitations.  Outpatient Medications Prior to Visit  Medication Sig Dispense Refill  . atorvastatin (LIPITOR) 20 MG tablet TAKE 1 TABLET BY MOUTH EVERY DAY 90 tablet 1  . cyanocobalamin 1000 MCG tablet Take 1,000 mcg by mouth daily.     Marland Kitchen ELIQUIS 5 MG TABS tablet TAKE 1 TABLET BY MOUTH TWICE A DAY 180 tablet 1  . furosemide (LASIX) 20 MG tablet TAKE 1 TABLET BY MOUTH EVERY DAY 90 tablet 3  . levothyroxine (SYNTHROID) 50 MCG tablet Take 1 tablet (50 mcg total) by mouth daily. 90 tablet 1  . metoprolol succinate (TOPROL-XL) 25 MG 24 hr tablet TAKE 1 TABLET BY MOUTH EVERY DAY 90 tablet 2  . dapsone 100 MG tablet TAKE 1/2 TABLET BY MOUTH EVERY DAY 45 tablet 1   No facility-administered medications prior to visit.    ROS Review of Systems  Constitutional: Positive for unexpected weight change (loss). Negative for chills, fatigue and fever.  HENT: Negative.  Negative for trouble swallowing.   Eyes: Negative for  visual disturbance.  Respiratory: Negative for cough, chest tightness, shortness of breath and wheezing.   Cardiovascular: Negative for chest pain, palpitations and leg swelling.  Gastrointestinal: Negative for abdominal pain, constipation, diarrhea, nausea and vomiting.  Endocrine: Negative.   Genitourinary: Positive for dysuria and frequency. Negative for hematuria, scrotal swelling, testicular pain and urgency.  Musculoskeletal: Negative for arthralgias and myalgias.  Skin: Negative.  Negative for color change and rash.  Neurological: Negative.  Negative for dizziness, weakness, light-headedness and headaches.  Hematological: Negative for adenopathy. Does not bruise/bleed easily.  Psychiatric/Behavioral: Negative.     Objective:  BP 116/70 (BP Location: Left Arm, Patient Position: Sitting, Cuff Size: Normal)   Pulse 63   Temp (!) 97.4 F (36.3 C) (Oral)   Resp 16   Ht 5' 8"  (1.727 m)   Wt 152 lb 12.8 oz (69.3 kg)   SpO2 97%   BMI 23.23 kg/m   BP Readings from Last 3 Encounters:  11/25/20 116/70  04/14/20 118/60  10/24/19 130/70    Wt Readings from Last 3 Encounters:  11/25/20 152 lb 12.8 oz (69.3 kg)  04/14/20 160 lb (72.6 kg)  10/24/19 166 lb (75.3 kg)    Physical Exam Vitals reviewed.  Constitutional:      Appearance: Normal appearance. He is not ill-appearing.  HENT:     Nose: Nose normal.     Mouth/Throat:     Mouth: Mucous membranes are moist.  Eyes:     General: No scleral icterus.  Conjunctiva/sclera: Conjunctivae normal.  Cardiovascular:     Rate and Rhythm: Normal rate. Rhythm irregularly irregular.     Heart sounds: No murmur heard.   Pulmonary:     Effort: Pulmonary effort is normal.     Breath sounds: No stridor. No wheezing, rhonchi or rales.  Abdominal:     General: Abdomen is flat.     Palpations: Abdomen is soft. There is no mass.     Tenderness: There is no abdominal tenderness. There is no guarding.     Hernia: There is no hernia in  the left inguinal area or right inguinal area.  Genitourinary:    Pubic Area: No rash.      Penis: Normal and circumcised.      Testes: Normal.        Right: Mass not present.        Left: Mass not present.     Epididymis:     Right: Normal.     Left: Normal.     Prostate: Normal. Not enlarged, not tender and no nodules present.     Rectum: Guaiac result negative. No mass, tenderness, anal fissure, external hemorrhoid or internal hemorrhoid. Abnormal anal tone.  Musculoskeletal:        General: Normal range of motion.     Cervical back: Neck supple.     Right lower leg: No edema.     Left lower leg: No edema.  Lymphadenopathy:     Cervical: No cervical adenopathy.     Lower Body: No right inguinal adenopathy. No left inguinal adenopathy.  Skin:    General: Skin is warm and dry.     Findings: No rash.  Neurological:     General: No focal deficit present.     Mental Status: He is alert.  Psychiatric:        Mood and Affect: Mood normal.        Behavior: Behavior normal.     Lab Results  Component Value Date   WBC 6.7 11/25/2020   HGB 11.6 (L) 11/25/2020   HCT 34.8 (L) 11/25/2020   PLT 213.0 11/25/2020   GLUCOSE 89 11/25/2020   CHOL 118 09/21/2019   TRIG 99 09/21/2019   HDL 42 09/21/2019   LDLCALC 57 09/21/2019   ALT 33 11/25/2020   AST 42 (H) 11/25/2020   NA 135 11/25/2020   K 4.6 11/25/2020   CL 99 11/25/2020   CREATININE 0.87 11/25/2020   BUN 22 11/25/2020   CO2 29 11/25/2020   TSH 5.86 (H) 11/25/2020   PSA 0.00 (L) 11/25/2020   INR 1.10 07/13/2018    Source: NOT GIVEN   Status: FINAL   Isolate 1: Escherichia coliAbnormal   Comment: Greater than 100,000 CFU/mL of Escherichia coli  Resulting Agency [facility]      Susceptibility   Escherichia coli    CULT, URN, SPECIAL NEGATIVE 1    AMOX/CLAVULANIC >=32  Resistant    AMPICILLIN >=32  Resistant    AMPICILLIN/SULBACTAM >=32  Resistant    CEFAZOLIN 32  Resistant 1    CEFEPIME <=1  Sensitive     CEFTRIAXONE <=1  Sensitive    CIPROFLOXACIN <=0.25  Sensitive    ERTAPENEM <=0.5  Sensitive    GENTAMICIN 4  Sensitive    IMIPENEM 0.5  Sensitive    LEVOFLOXACIN <=0.12  Sensitive    NITROFURANTOIN <=16  Sensitive    PIP/TAZO <=4  Sensitive    TOBRAMYCIN <=1  Sensitive    TRIMETH/SULFA <=20  Sensitive 2          1 For uncomplicated UTI caused by E. coli,  K. pneumoniae or P. mirabilis: Cefazolin is  susceptible if MIC <32 mcg/mL and predicts  susceptible to the oral agents cefaclor, cefdinir,  cefpodoxime, cefprozil, cefuroxime, cephalexin            Assessment & Plan:   Antuane was seen today for hypothyroidism, anemia and atrial fibrillation.  Diagnoses and all orders for this visit:  Permanent atrial fibrillation ([place])- He has good rate control.  Will continue anticoagulation with the DOAC. -     Basic metabolic panel; Future -     Basic metabolic panel  Other specified hypothyroidism- His TSH is in the normal range.  He will remain on the current dose of levothyroxine. -     TSH; Future -     TSH  Celiac disease- His antibody titers are high.  I recommended that he avoid gluten. -     dapsone 100 MG tablet; Take 0.5 tablets (50 mg total) by mouth daily. -     Gliadin antibodies, serum; Future -     Tissue transglutaminase, IgA; Future -     Reticulin Antibody, IgA w reflex titer; Future -     Reticulin Antibody, IgA w reflex titer -     Tissue transglutaminase, IgA -     Gliadin antibodies, serum  Macrocytic anemia- His H&H are stable.  B12 and folate levels are normal. -     CBC with Differential/Platelet; Future -     Vitamin B12; Future -     Folate; Future -     Folate -     Vitamin B12 -     CBC with Differential/Platelet  Hyperlipidemia with target LDL less than 130- Statin therapy is not indicated. -     Hepatic function panel; Future -     TSH; Future -     TSH -     Hepatic function panel  History of prostate cancer- His PSA is  undetectable. -     PSA; Future -     PSA  Dysuria- His urine culture is positive for E. coli.  Will treat with SMX/TMP. -     Urinalysis, Routine w reflex microscopic; Future -     CULTURE, URINE COMPREHENSIVE; Future -     CULTURE, URINE COMPREHENSIVE -     Urinalysis, Routine w reflex microscopic  Urinary tract infection without hematuria, site unspecified -     sulfamethoxazole-trimethoprim (BACTRIM DS) 800-160 MG tablet; Take 1 tablet by mouth 2 (two) times daily for 5 days.   I have changed Kesean C. Sledd's dapsone. I am also having him start on sulfamethoxazole-trimethoprim. Additionally, I am having him maintain his cyanocobalamin, levothyroxine, furosemide, Eliquis, metoprolol succinate, and atorvastatin.  Meds ordered this encounter  Medications  . dapsone 100 MG tablet    Sig: Take 0.5 tablets (50 mg total) by mouth daily.    Dispense:  45 tablet    Refill:  1  . sulfamethoxazole-trimethoprim (BACTRIM DS) 800-160 MG tablet    Sig: Take 1 tablet by mouth 2 (two) times daily for 5 days.    Dispense:  10 tablet    Refill:  0   I spent 50 minutes in preparing to see the patient by review of recent labs, imaging and procedures, obtaining and reviewing separately obtained history, communicating with the patient and family or caregiver, ordering medications, tests or procedures, and documenting clinical information  in the EHR including the differential Dx, treatment, and any further evaluation and other management of 1. Other specified hypothyroidism 2. Celiac disease 3. Permanent atrial fibrillation ([place]) 4. Macrocytic anemia 5. Hyperlipidemia with target LDL less than 130 6. History of prostate cancer 7. Dysuria 8. Urinary tract infection without hematuria, site unspecified     Follow-up: Return in about 3 months (around 02/23/2021).  [name], MD

## 2020-11-25 NOTE — Patient Instructions (Signed)

## 2020-12-03 DIAGNOSIS — N390 Urinary tract infection, site not specified: Secondary | ICD-10-CM | POA: Insufficient documentation

## 2020-12-03 LAB — GLIADIN ANTIBODIES, SERUM
Gliadin IgA: >250 U/mL — ABNORMAL HIGH
Gliadin IgG: 55.5 U/mL — ABNORMAL HIGH

## 2020-12-03 LAB — TISSUE TRANSGLUTAMINASE, IGA: (tTG) Ab, IgA: 123.5 U/mL — ABNORMAL HIGH

## 2020-12-03 LAB — CULTURE, URINE COMPREHENSIVE

## 2020-12-03 LAB — RETICULIN ANTIBODIES, IGA W TITER: Reticulin IgA Screen: NEGATIVE

## 2020-12-03 MED ORDER — SULFAMETHOXAZOLE-TRIMETHOPRIM 800-160 MG PO TABS: 1.0000 | ORAL_TABLET | Freq: Two times a day (BID) | ORAL | 0 refills | Status: AC

## 2020-12-04 ENCOUNTER — Telehealth: Payer: Self-pay | Admitting: Internal Medicine

## 2020-12-04 NOTE — Telephone Encounter (Signed)
Patient awaiting a call for lab results of urine test and blood work. Please call the patient at [phone]

## 2020-12-23 NOTE — Progress Notes (Deleted)
HPI: FU atrial fibrillation, MR and TR. Patient had electrocardiogram on 07/20/2013 that showed atrial fibrillation. We treated with rate control/anticoagulation. Patient developed pedal edema previously and was treated with diuretics. His edema resolved but he developed orthostasis followed by syncope.We are treating his valvular heart disease conservatively given his age. Last echocardiogram January 2021 showed normal LV function, mild LVH, mild mitral regurgitation, severe biatrial enlargement, moderate TR, mildly dilated aortic root at 40 mm.  Since last seen,  Current Outpatient Medications  Medication Sig Dispense Refill  . atorvastatin (LIPITOR) 20 MG tablet TAKE 1 TABLET BY MOUTH EVERY DAY 90 tablet 1  . cyanocobalamin 1000 MCG tablet Take 1,000 mcg by mouth daily.     . dapsone 100 MG tablet Take 0.5 tablets (50 mg total) by mouth daily. 45 tablet 1  . ELIQUIS 5 MG TABS tablet TAKE 1 TABLET BY MOUTH TWICE A DAY 180 tablet 1  . furosemide (LASIX) 20 MG tablet TAKE 1 TABLET BY MOUTH EVERY DAY 90 tablet 3  . levothyroxine (SYNTHROID) 50 MCG tablet Take 1 tablet (50 mcg total) by mouth daily. 90 tablet 1  . metoprolol succinate (TOPROL-XL) 25 MG 24 hr tablet TAKE 1 TABLET BY MOUTH EVERY DAY 90 tablet 2   No current facility-administered medications for this visit.     Past Medical History:  Diagnosis Date  . Alcohol abuse   . Allergic dermatitis   . Atrial fibrillation ([place])   . Celiac disease   . Constipation   . CVA (cerebral infarction) 1987   blood clot, right side and speech issues, recovered quickly  . DJD (degenerative joint disease)   . Macrocytic anemia   . Nonspecific (abnormal) findings on radiological and other examination of other intrathoracic organs   . Nonspecific abnormal results of thyroid function study   . Olecranon bursitis   . Prostate cancer ([place])   . Pure hypercholesterolemia   . Unspecified hemorrhoids without mention of complication      Past Surgical History:  Procedure Laterality Date  . COLONOSCOPY    . KYPHOPLASTY N/A 07/13/2018   Procedure: KYPHOPLASTY L2, L3, L5;  Surgeon: Melina Schools, MD;  Location: [facility];  Service: Orthopedics;  Laterality: N/A;  120 mins  . prostate biopsy and see implantation    . WRIST FRACTURE SURGERY      Social History   Socioeconomic History  . Marital status: Widowed    Spouse name: Not on file  . Number of children: 3  . Years of education: Not on file  . Highest education level: Not on file  Occupational History  . 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 Smoking status: Never Smoker  . Smokeless tobacco: Never Used  Vaping Use  . Vaping Use: Never used  Substance and Sexual Activity  . Alcohol use: Yes    Comment: occasional (heavy drinker in the past)  . Drug use: Never  . Sexual activity: Never  Other Topics Concern  . Not on file  Social History Narrative  . Not on file   Social Determinants of Health   Financial Resource Strain: Not on file  Food Insecurity: Not on file  Transportation Needs: Not on file  Physical Activity: Not on file  Stress: Not on file  Social Connections: Not on file  Intimate Partner Violence: Not on file    Family History  Problem Relation Age of Onset  . Heart disease Other  No family history    ROS: no fevers or chills, productive cough, hemoptysis, dysphasia, odynophagia, melena, hematochezia, dysuria, hematuria, rash, seizure activity, orthopnea, PND, pedal edema, claudication. Remaining systems are negative.  Physical Exam: Well-developed well-nourished in no acute distress.  Skin is warm and dry.  HEENT is normal.  Neck is supple.  Chest is clear to auscultation with normal expansion.  Cardiovascular exam is regular rate and rhythm.  Abdominal exam nontender or distended. No masses palpated. Extremities show no edema. neuro grossly intact  ECG- personally reviewed  A/P  1 MR/TR-MR mild  and TR moderate on most recent echo. Treating conservatively given pt's age and preference. Will need fu echos in the future.  2 permanent atrial fibrillation-continue beta blocker for rate control; continue apixaban; check hgb and renal function.   3 hyperlipidemia-continue statin.  Kirk Ruths, MD

## 2020-12-24 ENCOUNTER — Other Ambulatory Visit: Payer: Self-pay | Admitting: Cardiology

## 2020-12-24 DIAGNOSIS — R609 Edema, unspecified: Secondary | ICD-10-CM

## 2021-01-06 ENCOUNTER — Ambulatory Visit: Payer: Medicare Other | Admitting: Cardiology

## 2021-03-04 ENCOUNTER — Other Ambulatory Visit: Payer: Self-pay | Admitting: Cardiology

## 2021-03-04 NOTE — Telephone Encounter (Signed)
Prescription refill request for Eliquis received. Indication:atrial fibrillation Last office visit:4/21  [place] Scr:0.87  12/21 Age: 87 Weight:69.3 kg  Prescription refilled

## 2021-03-13 ENCOUNTER — Other Ambulatory Visit: Payer: Self-pay

## 2021-03-13 ENCOUNTER — Ambulatory Visit (INDEPENDENT_AMBULATORY_CARE_PROVIDER_SITE_OTHER): Payer: Medicare Other

## 2021-03-13 VITALS — BP 120/78 | HR 85 | Temp 98.3°F | Ht 68.0 in | Wt 149.2 lb

## 2021-03-13 DIAGNOSIS — Z0000 Encounter for general adult medical examination without abnormal findings: Secondary | ICD-10-CM | POA: Diagnosis not present

## 2021-03-13 NOTE — Patient Instructions (Signed)
[name] , Thank you for taking time to come for your Medicare Wellness Visit. I appreciate your ongoing commitment to your health goals. Please review the following plan we discussed and let me know if I can assist you in the future.   Screening recommendations/referrals: Colonoscopy: not a candidate for colon cancer screening due to age Recommended yearly ophthalmology/optometry visit for glaucoma screening and checkup Recommended yearly dental visit for hygiene and checkup  Vaccinations: Influenza vaccine: 10/16/2020 Pneumococcal vaccine: 10/03/2014, 10/24/2019 Tdap vaccine: overdue; can check with local pharmacy Shingles vaccine: never done; can check with local pharmacy   Covid-19: 01/04/2020, 02/04/2020, 10/16/2020  Advanced directives: Please bring a copy of your health care power of attorney and living will to the office at your convenience.  Conditions/risks identified: Yes; Reviewed health maintenance screenings with patient today and relevant education, vaccines, and/or referrals were provided. Please continue to do your personal lifestyle choices by: daily care of teeth and gums, regular physical activity (goal should be 5 days a week for 30 minutes), eat a healthy diet, avoid tobacco and drug use, limiting any alcohol intake, taking a low-dose aspirin (if not allergic or have been advised by your provider otherwise) and taking vitamins and minerals as recommended by your provider. Continue doing brain stimulating activities (puzzles, reading, adult coloring books, staying active) to keep memory sharp. Continue to eat heart healthy diet (full of fruits, vegetables, whole grains, lean protein, water--limit salt, fat, and sugar intake) and increase physical activity as tolerated.  Next appointment: Please schedule your next Medicare Wellness Visit with your Nurse Health Advisor in 1 year by calling [phone].  Preventive Care 85 Years and Older, Male Preventive care refers to  lifestyle choices and visits with your health care provider that can promote health and wellness. What does preventive care include?  A yearly physical exam. This is also called an annual well check.  Dental exams once or twice a year.  Routine eye exams. Ask your health care provider how often you should have your eyes checked.  Personal lifestyle choices, including:  Daily care of your teeth and gums.  Regular physical activity.  Eating a healthy diet.  Avoiding tobacco and drug use.  Limiting alcohol use.  Practicing safe sex.  Taking low doses of aspirin every day.  Taking vitamin and mineral supplements as recommended by your health care provider. What happens during an annual well check? The services and screenings done by your health care provider during your annual well check will depend on your age, overall health, lifestyle risk factors, and family history of disease. Counseling  Your health care provider may ask you questions about your:  Alcohol use.  Tobacco use.  Drug use.  Emotional well-being.  Home and relationship well-being.  Sexual activity.  Eating habits.  History of falls.  Memory and ability to understand (cognition).  Work and work Statistician. Screening  You may have the following tests or measurements:  Height, weight, and BMI.  Blood pressure.  Lipid and cholesterol levels. These may be checked every 5 years, or more frequently if you are over 19 years old.  Skin check.  Lung cancer screening. You may have this screening every year starting at age 87 if you have a 30-pack-year history of smoking and currently smoke or have quit within the past 15 years.  Fecal occult blood test (FOBT) of the stool. You may have this test every year starting at age 37.  Flexible sigmoidoscopy or colonoscopy. You may have a  sigmoidoscopy every 5 years or a colonoscopy every 10 years starting at age 4.  Prostate cancer screening.  Recommendations will vary depending on your family history and other risks.  Hepatitis C blood test.  Hepatitis B blood test.  Sexually transmitted disease (STD) testing.  Diabetes screening. This is done by checking your blood sugar (glucose) after you have not eaten for a while (fasting). You may have this done every 1-3 years.  Abdominal aortic aneurysm (AAA) screening. You may need this if you are a current or former smoker.  Osteoporosis. You may be screened starting at age 10 if you are at high risk. Talk with your health care provider about your test results, treatment options, and if necessary, the need for more tests. Vaccines  Your health care provider may recommend certain vaccines, such as:  Influenza vaccine. This is recommended every year.  Tetanus, diphtheria, and acellular pertussis (Tdap, Td) vaccine. You may need a Td booster every 10 years.  Zoster vaccine. You may need this after age 27.  Pneumococcal 13-valent conjugate (PCV13) vaccine. One dose is recommended after age 23.  Pneumococcal polysaccharide (PPSV23) vaccine. One dose is recommended after age 58. Talk to your health care provider about which screenings and vaccines you need and how often you need them. This information is not intended to replace advice given to you by your health care provider. Make sure you discuss any questions you have with your health care provider. Document Released: 01/02/2016 Document Revised: 08/25/2016 Document Reviewed: 10/07/2015 Elsevier Interactive Patient Education  2017 [employer] Prevention in the Home Falls can cause injuries. They can happen to people of all ages. There are many things you can do to make your home safe and to help prevent falls. What can I do on the outside of my home?  Regularly fix the edges of walkways and driveways and fix any cracks.  Remove anything that might make you trip as you walk through a door, such as a raised step or  threshold.  Trim any bushes or trees on the path to your home.  Use bright outdoor lighting.  Clear any walking paths of anything that might make someone trip, such as rocks or tools.  Regularly check to see if handrails are loose or broken. Make sure that both sides of any steps have handrails.  Any raised decks and porches should have guardrails on the edges.  Have any leaves, snow, or ice cleared regularly.  Use sand or salt on walking paths during winter.  Clean up any spills in your garage right away. This includes oil or grease spills. What can I do in the bathroom?  Use night lights.  Install grab bars by the toilet and in the tub and shower. Do not use towel bars as grab bars.  Use non-skid mats or decals in the tub or shower.  If you need to sit down in the shower, use a plastic, non-slip stool.  Keep the floor dry. Clean up any water that spills on the floor as soon as it happens.  Remove soap buildup in the tub or shower regularly.  Attach bath mats securely with double-sided non-slip rug tape.  Do not have throw rugs and other things on the floor that can make you trip. What can I do in the bedroom?  Use night lights.  Make sure that you have a light by your bed that is easy to reach.  Do not use any sheets or blankets that are  too big for your bed. They should not hang down onto the floor.  Have a firm chair that has side arms. You can use this for support while you get dressed.  Do not have throw rugs and other things on the floor that can make you trip. What can I do in the kitchen?  Clean up any spills right away.  Avoid walking on wet floors.  Keep items that you use a lot in easy-to-reach places.  If you need to reach something above you, use a strong step stool that has a grab bar.  Keep electrical cords out of the way.  Do not use floor polish or wax that makes floors slippery. If you must use wax, use non-skid floor wax.  Do not have  throw rugs and other things on the floor that can make you trip. What can I do with my stairs?  Do not leave any items on the stairs.  Make sure that there are handrails on both sides of the stairs and use them. Fix handrails that are broken or loose. Make sure that handrails are as long as the stairways.  Check any carpeting to make sure that it is firmly attached to the stairs. Fix any carpet that is loose or worn.  Avoid having throw rugs at the top or bottom of the stairs. If you do have throw rugs, attach them to the floor with carpet tape.  Make sure that you have a light switch at the top of the stairs and the bottom of the stairs. If you do not have them, ask someone to add them for you. What else can I do to help prevent falls?  Wear shoes that:  Do not have high heels.  Have rubber bottoms.  Are comfortable and fit you well.  Are closed at the toe. Do not wear sandals.  If you use a stepladder:  Make sure that it is fully opened. Do not climb a closed stepladder.  Make sure that both sides of the stepladder are locked into place.  Ask someone to hold it for you, if possible.  Clearly mark and make sure that you can see:  Any grab bars or handrails.  First and last steps.  Where the edge of each step is.  Use tools that help you move around (mobility aids) if they are needed. These include:  Canes.  Walkers.  Scooters.  Crutches.  Turn on the lights when you go into a dark area. Replace any light bulbs as soon as they burn out.  Set up your furniture so you have a clear path. Avoid moving your furniture around.  If any of your floors are uneven, fix them.  If there are any pets around you, be aware of where they are.  Review your medicines with your doctor. Some medicines can make you feel dizzy. This can increase your chance of falling. Ask your doctor what other things that you can do to help prevent falls. This information is not intended to  replace advice given to you by your health care provider. Make sure you discuss any questions you have with your health care provider. Document Released: 10/02/2009 Document Revised: 05/13/2016 Document Reviewed: 01/10/2015 Elsevier Interactive Patient Education  2017 [employer].

## 2021-03-13 NOTE — Progress Notes (Signed)
Subjective:   [name] is a 87 y.o. male who presents for Medicare Annual/Subsequent preventive examination.  Review of Systems    No ROS. Medicare Wellness Visit. Additional risk factors are reflected in social history. Cardiac Risk Factors include: advanced age (>9mn, >>67women);dyslipidemia;male gender     Objective:    Today's Vitals   03/13/21 1446  BP: 120/78  Pulse: 85  Temp: 98.3 F (36.8 C)  SpO2: 92%  Weight: 149 lb 3.2 oz (67.7 kg)  Height: 5' 8"  (1.727 m)  PainSc: 0-No pain   Body mass index is 22.69 kg/m.  Advanced Directives 03/13/2021 11/07/2019 10/10/2018 07/11/2018 02/26/2016  Does Patient Have a Medical Advance Directive? Yes Yes Yes Yes Yes  Type of Advance Directive - [place] will [place]Living will [place] will  Does patient want to make changes to medical advance directive? No - Patient declined - - No - Patient declined No - Patient declined  Copy of [place] Chart? - No - copy requested No - copy requested No - copy requested Yes    Current Medications (verified) Outpatient Encounter Medications as of 03/13/2021  Medication Sig  . atorvastatin (LIPITOR) 20 MG tablet TAKE 1 TABLET BY MOUTH EVERY DAY  . cyanocobalamin 1000 MCG tablet Take 1,000 mcg by mouth daily.   . dapsone 100 MG tablet Take 0.5 tablets (50 mg total) by mouth daily.  .Marland KitchenELIQUIS 5 MG TABS tablet TAKE 1 TABLET BY MOUTH TWICE A DAY  . furosemide (LASIX) 20 MG tablet TAKE 1 TABLET BY MOUTH EVERY DAY  . levothyroxine (SYNTHROID) 50 MCG tablet Take 1 tablet (50 mcg total) by mouth daily.  . metoprolol succinate (TOPROL-XL) 25 MG 24 hr tablet TAKE 1 TABLET BY MOUTH EVERY DAY   No facility-administered encounter medications on file as of 03/13/2021.    Allergies (verified) Patient has no known allergies.   History: Past Medical History:   Diagnosis Date  . Alcohol abuse   . Allergic dermatitis   . Atrial fibrillation ([place]   . Celiac disease   . Constipation   . CVA (cerebral infarction) 1987   blood clot, right side and speech issues, recovered quickly  . DJD (degenerative joint disease)   . Macrocytic anemia   . Nonspecific (abnormal) findings on radiological and other examination of other intrathoracic organs   . Nonspecific abnormal results of thyroid function study   . Olecranon bursitis   . Prostate cancer ([place]   . Pure hypercholesterolemia   . Unspecified hemorrhoids without mention of complication    Past Surgical History:  Procedure Laterality Date  . COLONOSCOPY    . KYPHOPLASTY N/A 07/13/2018   Procedure: KYPHOPLASTY L2, L3, L5;  Surgeon: BMelina Schools MD;  [place]  Service: Orthopedics;  Laterality: N/A;  120 mins  . prostate biopsy and see implantation    . WRIST FRACTURE SURGERY     Family History  Problem Relation Age of Onset  . Heart disease Other        No family history   Social History   Socioeconomic History  . Marital status: Widowed    Spouse name: Not on file  . Number of children: 3  . Years of education: Not on file  . Highest education level: Not on file  Occupational History  . 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 Smoking status: Never  Smoker  . Smokeless tobacco: Never Used  Vaping Use  . Vaping Use: Never used  Substance and Sexual Activity  . Alcohol use: Yes    Comment: occasional (heavy drinker in the past)  . Drug use: Never  . Sexual activity: Never  Other Topics Concern  . Not on file  Social History Narrative  . Not on file   Social Determinants of Health   Financial Resource Strain: Low Risk   . Difficulty of Paying Living Expenses: Not hard at all  Food Insecurity: No Food Insecurity  . Worried About Charity fundraiser in the Last Year: Never true  . Ran Out of Food in the Last Year: Never true  Transportation Needs: No  Transportation Needs  . Lack of Transportation (Medical): No  . Lack of Transportation (Non-Medical): No  Physical Activity: Insufficiently Active  . Days of Exercise per Week: 6 days  . Minutes of Exercise per Session: 20 min  Stress: No Stress Concern Present  . Feeling of Stress : Not at all  Social Connections: Not on file    Tobacco Counseling Counseling given: Not Answered   Clinical Intake:  Pre-visit preparation completed: Yes  Pain : No/denies pain Pain Score: 0-No pain     BMI - recorded: 22.69 Nutritional Status: BMI of 19-24  Normal Nutritional Risks: None,Unintentional weight loss (Encourage Ensure Protein Max) Diabetes: No  How often do you need to have someone help you when you read instructions, pamphlets, or other written materials from your doctor or pharmacy?: 1 - Never What is the last grade level you completed in school?: Post Graduate from [employer]  Diabetic? no  Interpreter Needed?: No  Information entered by :: Lisette Abu, LPN   Activities of Daily Living In your present state of health, do you have any difficulty performing the following activities: 03/13/2021  Hearing? N  Vision? N  Difficulty concentrating or making decisions? N  Walking or climbing stairs? N  Dressing or bathing? N  Doing errands, shopping? N  Preparing Food and eating ? N  Using the Toilet? N  In the past six months, have you accidently leaked urine? N  Do you have problems with loss of bowel control? N  Managing your Medications? N  Managing your Finances? N  Housekeeping or managing your Housekeeping? N  Some recent data might be hidden    Patient Care Team: [name], MD as PCP - General (Internal Medicine) Stanford Breed Denice Bors, MD as PCP - Cardiology (Cardiology) Melina Schools, MD as Consulting Physician (Orthopedic Surgery) Netty Starring, PT as Physical Therapist (Medical Genetics) Rutherford Guys, MD as Consulting Physician  (Ophthalmology)  Indicate any recent Medical Services you may have received from other than [facility] providers in the past year (date may be approximate).     Assessment:   This is a routine wellness examination for [name].  Hearing/Vision screen No exam data present  Dietary issues and exercise activities discussed: Current Exercise Habits: Home exercise routine, Type of exercise: walking, Time (Minutes): 20, Frequency (Times/Week): 6, Weekly Exercise (Minutes/Week): 120, Intensity: Mild, Exercise limited by: cardiac condition(s);orthopedic condition(s)  Goals    . Patient Stated     Stay as healthy and as independent as possible.      Depression Screen PHQ 2/9 Scores 03/13/2021 11/25/2020 11/07/2019 10/24/2019 10/10/2018 03/24/2017 02/26/2016  PHQ - 2 Score 0 0 2 0 1 0 0  PHQ- 9 Score - - 3 - - - -  Fall Risk Fall Risk  03/13/2021 11/25/2020 11/14/2019 11/07/2019 10/24/2019  Falls in the past year? 0 0 0 0 0  Comment - - Emmi Telephone Survey: data to providers prior to load - -  Number falls in past yr: 0 0 - 0 0  Injury with Fall? 0 0 - 0 0  Risk for fall due to : No Fall Risks Impaired balance/gait;Impaired mobility - Impaired mobility;Impaired balance/gait Impaired mobility;Impaired balance/gait  Follow up Falls evaluation completed Falls evaluation completed - Falls prevention discussed Falls evaluation completed    FALL RISK PREVENTION PERTAINING TO THE HOME:  Any stairs in or around the home? No  If so, are there any without handrails? No  Home free of loose throw rugs in walkways, pet beds, electrical cords, etc? Yes  Adequate lighting in your home to reduce risk of falls? Yes   ASSISTIVE DEVICES UTILIZED TO PREVENT FALLS:  Life alert? No  Use of a cane, walker or w/c? Yes  Grab bars in the bathroom? No  Shower chair or bench in shower? No  Elevated toilet seat or a handicapped toilet? No   TIMED UP AND GO:  Was the test performed? No .  Length of time to ambulate  10 feet: 0 sec.   Gait steady and fast with assistive device  Cognitive Function: MMSE - Mini Mental State Exam 10/10/2018  Orientation to time 5  Orientation to Place 5  Registration 3  Attention/ Calculation 5  Recall 1  Language- name 2 objects 2  Language- repeat 1  Language- follow 3 step command 3  Language- read & follow direction 1  Write a sentence 1  Copy design 1  Total score 28     6CIT Screen 11/07/2019  What Year? 0 points  What month? 0 points  What time? 0 points  Count back from 20 0 points  Months in reverse 0 points  Repeat phrase 2 points  Total Score 2    Immunizations Immunization History  Administered Date(s) Administered  . Fluad Quad(high Dose 65+) 10/16/2020  . Influenza Split 09/24/2013, 10/12/2019, 10/16/2020  . Influenza, High Dose Seasonal PF 10/21/2017, 10/10/2018  . Influenza,inj,Quad PF,6+ Mos 10/03/2014, 10/25/2016  . Influenza-Unspecified 09/20/2015  . PFIZER(Purple Top)SARS-COV-2 Vaccination 01/04/2020, 02/04/2020, 10/16/2020  . Pneumococcal Conjugate-13 10/03/2014  . Pneumococcal Polysaccharide-23 06/07/2012, 10/24/2019  . Tdap 12/20/2009    TDAP status: Due, Education has been provided regarding the importance of this vaccine. Advised may receive this vaccine at local pharmacy or Health Dept. Aware to provide a copy of the vaccination record if obtained from local pharmacy or Health Dept. Verbalized acceptance and understanding.  Flu Vaccine status: Up to date  Pneumococcal vaccine status: Up to date  Covid-19 vaccine status: Completed vaccines  Qualifies for Shingles Vaccine? Yes   Zostavax completed No   Shingrix Completed?: No.    Education has been provided regarding the importance of this vaccine. Patient has been advised to call insurance company to determine out of pocket expense if they have not yet received this vaccine. Advised may also receive vaccine at local pharmacy or Health Dept. Verbalized acceptance and  understanding.  Screening Tests Health Maintenance  Topic Date Due  . TETANUS/TDAP  12/21/2019  . INFLUENZA VACCINE  Completed  . COVID-19 Vaccine  Completed  . PNA vac Low Risk Adult  Completed  . HPV VACCINES  Aged Out    Health Maintenance  Health Maintenance Due  Topic Date Due  . Samul Dada  12/21/2019  Colorectal cancer screening: No longer required.   Lung Cancer Screening: (Low Dose CT Chest recommended if Age 64-80 years, 30 pack-year currently smoking OR have quit w/in 15years.) does not qualify.   Lung Cancer Screening Referral: no  Additional Screening:  Hepatitis C Screening: does not qualify; Completed no  Vision Screening: Recommended annual ophthalmology exams for early detection of glaucoma and other disorders of the eye. Is the patient up to date with their annual eye exam?  Yes  Who is the provider or what is the name of the office in which the patient attends annual eye exams? Rutherford Guys, MD. If pt is not established with a provider, would they like to be referred to a provider to establish care? No .   Dental Screening: Recommended annual dental exams for proper oral hygiene  Community Resource Referral / Chronic Care Management: CRR required this visit?  No   CCM required this visit?  No      Plan:     I have personally reviewed and noted the following in the patient's chart:   . Medical and social history . Use of alcohol, tobacco or illicit drugs  . Current medications and supplements . Functional ability and status . Nutritional status . Physical activity . Advanced directives . List of other physicians . Hospitalizations, surgeries, and ER visits in previous 12 months . Vitals . Screenings to include cognitive, depression, and falls . Referrals and appointments  In addition, I have reviewed and discussed with patient certain preventive protocols, quality metrics, and best practice recommendations. A written personalized care  plan for preventive services as well as general preventive health recommendations were provided to patient.     Sheral Flow, LPN   03/18/761   Nurse Notes:   Normal cognitive status assessed by direct observation by this Nurse Health Advisor. No abnormalities found.  Medications reviewed with patient; no opioid use noted.

## 2021-03-16 ENCOUNTER — Other Ambulatory Visit: Payer: Self-pay | Admitting: Internal Medicine

## 2021-03-16 DIAGNOSIS — E038 Other specified hypothyroidism: Secondary | ICD-10-CM

## 2021-03-16 MED ORDER — LEVOTHYROXINE SODIUM 50 MCG PO TABS: 50.0000 ug | ORAL_TABLET | Freq: Every day | ORAL | 1 refills | Status: DC

## 2021-03-19 NOTE — Progress Notes (Signed)
HPI: FU atrial fibrillation, MR and TR. Patient had electrocardiogram on 07/20/2013 that showed atrial fibrillation. We treated with rate control/anticoagulation. Patient developed pedal edema previously and was treated with diuretics. His edema resolved but he developed orthostasis followed by syncope.We are treating his valvular heart disease conservatively given his age. Last echocardiogram January 2021 showed normal LV function, mild LVH, mild mitral regurgitation, severe biatrial enlargement, moderate TR, mildly dilated aortic root at 40 mm.  Since last seen,patient denies dyspnea, chest pain, palpitations or syncope.  No bleeding.  Current Outpatient Medications  Medication Sig Dispense Refill  . atorvastatin (LIPITOR) 20 MG tablet TAKE 1 TABLET BY MOUTH EVERY DAY 90 tablet 1  . cyanocobalamin 1000 MCG tablet Take 1,000 mcg by mouth daily.     . dapsone 100 MG tablet Take 0.5 tablets (50 mg total) by mouth daily. 45 tablet 1  . ELIQUIS 5 MG TABS tablet TAKE 1 TABLET BY MOUTH TWICE A DAY 180 tablet 1  . furosemide (LASIX) 20 MG tablet TAKE 1 TABLET BY MOUTH EVERY DAY 90 tablet 3  . levothyroxine (SYNTHROID) 50 MCG tablet Take 1 tablet (50 mcg total) by mouth daily. 90 tablet 1  . metoprolol succinate (TOPROL-XL) 25 MG 24 hr tablet TAKE 1 TABLET BY MOUTH EVERY DAY 90 tablet 2   No current facility-administered medications for this visit.     Past Medical History:  Diagnosis Date  . Alcohol abuse   . Allergic dermatitis   . Atrial fibrillation ([place])   . Celiac disease   . Constipation   . CVA (cerebral infarction) 1987   blood clot, right side and speech issues, recovered quickly  . DJD (degenerative joint disease)   . Macrocytic anemia   . Nonspecific (abnormal) findings on radiological and other examination of other intrathoracic organs   . Nonspecific abnormal results of thyroid function study   . Olecranon bursitis   . Prostate cancer ([place])   . Pure  hypercholesterolemia   . Unspecified hemorrhoids without mention of complication     Past Surgical History:  Procedure Laterality Date  . COLONOSCOPY    . KYPHOPLASTY N/A 07/13/2018   Procedure: KYPHOPLASTY L2, L3, L5;  Surgeon: Melina Schools, MD;  Location: [facility];  Service: Orthopedics;  Laterality: N/A;  120 mins  . prostate biopsy and see implantation    . WRIST FRACTURE SURGERY      Social History   Socioeconomic History  . Marital status: Widowed    Spouse name: Not on file  . Number of children: 3  . Years of education: Not on file  . Highest education level: Not on file  Occupational History  . 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 Smoking status: Never Smoker  . Smokeless tobacco: Never Used  Vaping Use  . Vaping Use: Never used  Substance and Sexual Activity  . Alcohol use: Yes    Comment: occasional (heavy drinker in the past)  . Drug use: Never  . Sexual activity: Never  Other Topics Concern  . Not on file  Social History Narrative  . Not on file   Social Determinants of Health   Financial Resource Strain: Low Risk   . Difficulty of Paying Living Expenses: Not hard at all  Food Insecurity: No Food Insecurity  . Worried About Charity fundraiser in the Last Year: Never true  . Ran Out of Food in the Last Year: Never true  Transportation Needs: No Transportation  Needs  . Lack of Transportation (Medical): No  . Lack of Transportation (Non-Medical): No  Physical Activity: Insufficiently Active  . Days of Exercise per Week: 6 days  . Minutes of Exercise per Session: 20 min  Stress: No Stress Concern Present  . Feeling of Stress : Not at all  Social Connections: Not on file  Intimate Partner Violence: Not on file    Family History  Problem Relation Age of Onset  . Heart disease Other        No family history    ROS: no fevers or chills, productive cough, hemoptysis, dysphasia, odynophagia, melena, hematochezia, dysuria, hematuria,  rash, seizure activity, orthopnea, PND, pedal edema, claudication. Remaining systems are negative.  Physical Exam: Well-developed well-nourished in no acute distress.  Skin is warm and dry.  HEENT is normal.  Neck is supple.  Chest is clear to auscultation with normal expansion.  Cardiovascular exam is irregular Abdominal exam nontender or distended. No masses palpated. Extremities show no edema. neuro grossly intact  ECG-atrial fibrillation at a rate of 85, left anterior fascicular block, poor R wave progression cannot rule out prior anterior infarct.  Personally reviewed  A/P  1 mitral regurgitation/tricuspid regurgitation-as outlined previously given age we are treating conservatively.  Continue Lasix at present dose.  Repeat echo.   2 permanent atrial fibrillation-continue beta-blocker and apixaban.    3 hyperlipidemia-continue statin.  Kirk Ruths, MD

## 2021-03-24 ENCOUNTER — Ambulatory Visit: Payer: Medicare Other | Admitting: Cardiology

## 2021-03-26 ENCOUNTER — Other Ambulatory Visit: Payer: Self-pay

## 2021-03-26 ENCOUNTER — Other Ambulatory Visit: Payer: Self-pay | Admitting: Internal Medicine

## 2021-03-26 ENCOUNTER — Encounter: Payer: Self-pay | Admitting: Cardiology

## 2021-03-26 ENCOUNTER — Ambulatory Visit (INDEPENDENT_AMBULATORY_CARE_PROVIDER_SITE_OTHER): Payer: Medicare Other | Admitting: Cardiology

## 2021-03-26 VITALS — BP 106/60 | HR 85 | Ht 72.0 in | Wt 149.8 lb

## 2021-03-26 DIAGNOSIS — I059 Rheumatic mitral valve disease, unspecified: Secondary | ICD-10-CM | POA: Diagnosis not present

## 2021-03-26 DIAGNOSIS — K900 Celiac disease: Secondary | ICD-10-CM

## 2021-03-26 DIAGNOSIS — E7800 Pure hypercholesterolemia, unspecified: Secondary | ICD-10-CM

## 2021-03-26 DIAGNOSIS — I4821 Permanent atrial fibrillation: Secondary | ICD-10-CM | POA: Diagnosis not present

## 2021-03-26 MED ORDER — ELIQUIS 5 MG PO TABS: 1.0000 | ORAL_TABLET | Freq: Two times a day (BID) | ORAL | 3 refills | Status: DC

## 2021-03-26 NOTE — Patient Instructions (Signed)
  Testing/Procedures:  Your physician has requested that you have an echocardiogram. Echocardiography is a painless test that uses sound waves to create images of your heart. It provides your doctor with information about the size and shape of your heart and how well your heart's chambers and valves are working. This procedure takes approximately one hour. There are no restrictions for this procedure.Dale     Follow-Up: At [facility], you and your health needs are our priority.  As part of our continuing mission to provide you with exceptional heart care, we have created designated Provider Care Teams.  These Care Teams include your primary Cardiologist (physician) and Advanced Practice Providers (APPs -  Physician Assistants and Nurse Practitioners) who all work together to provide you with the care you need, when you need it.  We recommend signing up for the patient portal called "MyChart".  Sign up information is provided on this After Visit Summary.  MyChart is used to connect with patients for Virtual Visits (Telemedicine).  Patients are able to view lab/test results, encounter notes, upcoming appointments, etc.  Non-urgent messages can be sent to your provider as well.   To learn more about what you can do with MyChart, go to [email or web address].    Your next appointment:   12 month(s)  The format for your next appointment:   In Person  Provider:   Kirk Ruths, MD

## 2021-04-06 ENCOUNTER — Other Ambulatory Visit: Payer: Self-pay | Admitting: Internal Medicine

## 2021-04-06 DIAGNOSIS — E785 Hyperlipidemia, unspecified: Secondary | ICD-10-CM

## 2021-04-07 ENCOUNTER — Telehealth: Payer: Self-pay | Admitting: *Deleted

## 2021-04-07 NOTE — Telephone Encounter (Signed)
Left message for pt to call to discuss eliquis. Received a fax from insurance that they will not cover eliquis and want the patient to change to xarelto. Can give the patient a savings card for the eliquis or change him to xarelto if he prefers. We would like to continue eliquis but will do what is best for the patient.

## 2021-04-13 NOTE — Telephone Encounter (Signed)
Spoke with pt, he is currently using a card to help him get the eliquis.

## 2021-05-02 ENCOUNTER — Other Ambulatory Visit: Payer: Self-pay | Admitting: Cardiology

## 2021-05-02 DIAGNOSIS — I059 Rheumatic mitral valve disease, unspecified: Secondary | ICD-10-CM

## 2021-05-04 ENCOUNTER — Ambulatory Visit (HOSPITAL_COMMUNITY): Payer: Medicare Other | Attending: Cardiology

## 2021-05-04 ENCOUNTER — Other Ambulatory Visit: Payer: Self-pay

## 2021-05-04 DIAGNOSIS — I059 Rheumatic mitral valve disease, unspecified: Secondary | ICD-10-CM | POA: Insufficient documentation

## 2021-05-04 LAB — ECHOCARDIOGRAM COMPLETE
MV M vel: 4.95 m/s
MV Peak grad: 98.1 mmHg
S' Lateral: 2.9 cm

## 2021-05-04 NOTE — Telephone Encounter (Signed)
Confused as pt has eliquis on med list will route to pharmd pool to clarify

## 2021-06-18 ENCOUNTER — Telehealth: Payer: Self-pay | Admitting: Internal Medicine

## 2021-06-18 ENCOUNTER — Other Ambulatory Visit: Payer: Self-pay | Admitting: Cardiology

## 2021-06-18 DIAGNOSIS — E038 Other specified hypothyroidism: Secondary | ICD-10-CM

## 2021-06-18 NOTE — Telephone Encounter (Signed)
Rx(s) sent to pharmacy electronically.  

## 2021-07-01 ENCOUNTER — Other Ambulatory Visit: Payer: Self-pay | Admitting: Internal Medicine

## 2021-07-01 DIAGNOSIS — K900 Celiac disease: Secondary | ICD-10-CM

## 2021-07-01 DIAGNOSIS — E038 Other specified hypothyroidism: Secondary | ICD-10-CM

## 2021-07-01 MED ORDER — DAPSONE 100 MG PO TABS: 50.0000 mg | ORAL_TABLET | Freq: Every day | ORAL | 0 refills | Status: DC

## 2021-07-01 MED ORDER — LEVOTHYROXINE SODIUM 50 MCG PO TABS: 50.0000 ug | ORAL_TABLET | Freq: Every day | ORAL | 0 refills | Status: DC

## 2021-07-01 NOTE — Telephone Encounter (Signed)
   Patient requesting short supply of Dapson and Levothyroxine until upcoming appointment. Patient  has no medication remaining

## 2021-07-07 ENCOUNTER — Ambulatory Visit (INDEPENDENT_AMBULATORY_CARE_PROVIDER_SITE_OTHER): Payer: Medicare Other | Admitting: Internal Medicine

## 2021-07-07 ENCOUNTER — Other Ambulatory Visit: Payer: Self-pay

## 2021-07-07 ENCOUNTER — Encounter: Payer: Self-pay | Admitting: Internal Medicine

## 2021-07-07 VITALS — BP 112/60 | HR 94 | Temp 97.7°F | Resp 16 | Ht 72.0 in | Wt 147.0 lb

## 2021-07-07 DIAGNOSIS — E039 Hypothyroidism, unspecified: Secondary | ICD-10-CM | POA: Diagnosis not present

## 2021-07-07 DIAGNOSIS — I4821 Permanent atrial fibrillation: Secondary | ICD-10-CM | POA: Diagnosis not present

## 2021-07-07 DIAGNOSIS — D539 Nutritional anemia, unspecified: Secondary | ICD-10-CM

## 2021-07-07 LAB — CBC WITH DIFFERENTIAL/PLATELET
Basophils Absolute: 0 10*3/uL (ref 0.0–0.1)
Basophils Relative: 0.3 % (ref 0.0–3.0)
Eosinophils Absolute: 0.1 10*3/uL (ref 0.0–0.7)
Eosinophils Relative: 1 % (ref 0.0–5.0)
HCT: 35.1 % — ABNORMAL LOW (ref 39.0–52.0)
Hemoglobin: 11.6 g/dL — ABNORMAL LOW (ref 13.0–17.0)
Lymphocytes Relative: 43.7 % (ref 12.0–46.0)
Lymphs Abs: 3 10*3/uL (ref 0.7–4.0)
MCHC: 33.1 g/dL (ref 30.0–36.0)
MCV: 103.6 fl — ABNORMAL HIGH (ref 78.0–100.0)
Monocytes Absolute: 0.7 10*3/uL (ref 0.1–1.0)
Monocytes Relative: 10.2 % (ref 3.0–12.0)
Neutro Abs: 3.1 10*3/uL (ref 1.4–7.7)
Neutrophils Relative %: 44.8 % (ref 43.0–77.0)
Platelets: 191 10*3/uL (ref 150.0–400.0)
RBC: 3.38 Mil/uL — ABNORMAL LOW (ref 4.22–5.81)
RDW: 13.9 % (ref 11.5–15.5)
WBC: 6.9 10*3/uL (ref 4.0–10.5)

## 2021-07-07 LAB — TSH: TSH: 1.67 u[IU]/mL (ref 0.35–5.50)

## 2021-07-07 LAB — VITAMIN B12: Vitamin B-12: 1314 pg/mL — ABNORMAL HIGH (ref 211–911)

## 2021-07-07 LAB — FOLATE: Folate: 6.6 ng/mL (ref >5.9)

## 2021-07-07 NOTE — Patient Instructions (Signed)

## 2021-07-07 NOTE — Progress Notes (Signed)
Subjective:  Patient ID: [name], male    DOB: Dec 07, 1930  Age: 87 y.o. MRN: [id number]  CC: Anemia and Hypothyroidism  This visit occurred during the SARS-CoV-2 public health emergency.  Safety protocols were in place, including screening questions prior to the visit, additional usage of staff PPE, and extensive cleaning of exam room while observing appropriate contact time as indicated for disinfecting solutions.    HPI [name] presents for f/up -   He complains of wt loss.  Outpatient Medications Prior to Visit  Medication Sig Dispense Refill   apixaban (ELIQUIS) 5 MG TABS tablet Take 1 tablet (5 mg total) by mouth 2 (two) times daily. 180 tablet 3   atorvastatin (LIPITOR) 20 MG tablet TAKE 1 TABLET BY MOUTH EVERY DAY 90 tablet 1   cyanocobalamin 1000 MCG tablet Take 1,000 mcg by mouth daily.      dapsone 100 MG tablet Take 0.5 tablets (50 mg total) by mouth daily. 45 tablet 0   furosemide (LASIX) 20 MG tablet TAKE 1 TABLET BY MOUTH EVERY DAY 90 tablet 3   levothyroxine (SYNTHROID) 50 MCG tablet Take 1 tablet (50 mcg total) by mouth daily. 30 tablet 0   metoprolol succinate (TOPROL-XL) 25 MG 24 hr tablet TAKE 1 TABLET BY MOUTH EVERY DAY 90 tablet 2   No facility-administered medications prior to visit.    ROS Review of Systems  Constitutional:  Positive for appetite change (poor appetite) and unexpected weight change (wt loss). Negative for fatigue.  HENT: Negative.  Negative for trouble swallowing.   Eyes: Negative.  Negative for visual disturbance.  Respiratory:  Negative for cough, chest tightness, shortness of breath and wheezing.   Cardiovascular:  Negative for chest pain, palpitations and leg swelling.  Gastrointestinal:  Negative for abdominal pain, diarrhea, nausea and vomiting.  Endocrine: Negative.  Negative for cold intolerance and heat intolerance.  Genitourinary: Negative.  Negative for difficulty urinating.  Musculoskeletal:  Positive for  arthralgias. Negative for myalgias.  Skin: Negative.  Negative for color change, pallor and rash.  Neurological: Negative.  Negative for dizziness, weakness and light-headedness.  Hematological:  Negative for adenopathy. Does not bruise/bleed easily.  Psychiatric/Behavioral: Negative.     Objective:  BP 112/60 (BP Location: Left Arm, Patient Position: Sitting, Cuff Size: Large)   Pulse 94   Temp 97.7 F (36.5 C) (Oral)   Resp 16   Ht 6' (1.829 m)   Wt 147 lb (66.7 kg)   SpO2 96%   BMI 19.94 kg/m   BP Readings from Last 3 Encounters:  07/07/21 112/60  03/26/21 106/60  03/13/21 120/78    Wt Readings from Last 3 Encounters:  07/07/21 147 lb (66.7 kg)  03/26/21 149 lb 12.8 oz (67.9 kg)  03/13/21 149 lb 3.2 oz (67.7 kg)    Physical Exam Vitals reviewed.  Constitutional:      Appearance: He is ill-appearing (thin, frail).  HENT:     Mouth/Throat:     Mouth: Mucous membranes are moist.  Eyes:     General: No scleral icterus.    Conjunctiva/sclera: Conjunctivae normal.  Cardiovascular:     Rate and Rhythm: Normal rate. Rhythm regularly irregular.     Heart sounds: Murmur heard.  Systolic murmur is present with a grade of 1/6.     Comments: 1/6 SEM Pulmonary:     Effort: Pulmonary effort is normal.     Breath sounds: No stridor. No wheezing, rhonchi or rales.  Abdominal:  General: Abdomen is flat. Bowel sounds are normal.     Palpations: There is no mass.     Tenderness: There is no abdominal tenderness. There is no guarding.  Musculoskeletal:     Cervical back: Neck supple.     Right lower leg: No edema.     Left lower leg: No edema.  Lymphadenopathy:     Cervical: No cervical adenopathy.  Skin:    General: Skin is warm and dry.     Findings: No rash.  Neurological:     General: No focal deficit present.     Mental Status: He is alert.    Lab Results  Component Value Date   WBC 6.9 07/07/2021   HGB 11.6 (L) 07/07/2021   HCT 35.1 (L) 07/07/2021   PLT  191.0 07/07/2021   GLUCOSE 89 11/25/2020   CHOL 118 09/21/2019   TRIG 99 09/21/2019   HDL 42 09/21/2019   LDLCALC 57 09/21/2019   ALT 33 11/25/2020   AST 42 (H) 11/25/2020   NA 135 11/25/2020   K 4.6 11/25/2020   CL 99 11/25/2020   CREATININE 0.87 11/25/2020   BUN 22 11/25/2020   CO2 29 11/25/2020   TSH 1.67 07/07/2021   PSA 0.00 (L) 11/25/2020   INR 1.10 07/13/2018    No results found.  Assessment & Plan:   Kendricks was seen today for anemia and hypothyroidism.  Diagnoses and all orders for this visit:  Permanent atrial fibrillation ([place])- He has good rate control. -     TSH; Future -     TSH  Acquired hypothyroidism- His TSH is in the normal range. Will cont the current T4 dose. -     TSH; Future -     TSH  Macrocytic anemia- H/H are stable.  -     CBC with Differential/Platelet; Future -     Folate; Future -     Vitamin B12; Future -     Vitamin B12 -     Folate -     CBC with Differential/Platelet  I am having Ermin C. Misuraca maintain his cyanocobalamin, furosemide, Eliquis, atorvastatin, metoprolol succinate, levothyroxine, and dapsone.  No orders of the defined types were placed in this encounter.    Follow-up: Return in about 6 months (around 01/07/2022).  [name], MD

## 2021-07-08 ENCOUNTER — Encounter: Payer: Self-pay | Admitting: Internal Medicine

## 2021-09-07 ENCOUNTER — Other Ambulatory Visit: Payer: Self-pay | Admitting: Internal Medicine

## 2021-09-07 DIAGNOSIS — E038 Other specified hypothyroidism: Secondary | ICD-10-CM

## 2021-09-30 ENCOUNTER — Other Ambulatory Visit: Payer: Self-pay | Admitting: Internal Medicine

## 2021-09-30 DIAGNOSIS — E785 Hyperlipidemia, unspecified: Secondary | ICD-10-CM

## 2021-12-05 ENCOUNTER — Emergency Department (HOSPITAL_COMMUNITY): Payer: Medicare Other

## 2021-12-05 ENCOUNTER — Other Ambulatory Visit: Payer: Self-pay

## 2021-12-05 ENCOUNTER — Encounter (HOSPITAL_COMMUNITY): Payer: Self-pay | Admitting: Family Medicine

## 2021-12-05 ENCOUNTER — Inpatient Hospital Stay (HOSPITAL_COMMUNITY)
Admission: EM | Admit: 2021-12-05 | Discharge: 2021-12-15 | DRG: 872 | Disposition: A | Discharge status: Skilled Nursing Facility | Payer: Medicare Other | Attending: Internal Medicine | Admitting: Internal Medicine

## 2021-12-05 DIAGNOSIS — E7800 Pure hypercholesterolemia, unspecified: Secondary | ICD-10-CM | POA: Diagnosis present

## 2021-12-05 DIAGNOSIS — Z20822 Contact with and (suspected) exposure to covid-19: Secondary | ICD-10-CM | POA: Diagnosis present

## 2021-12-05 DIAGNOSIS — W010XXA Fall on same level from slipping, tripping and stumbling without subsequent striking against object, initial encounter: Secondary | ICD-10-CM | POA: Diagnosis present

## 2021-12-05 DIAGNOSIS — Z8546 Personal history of malignant neoplasm of prostate: Secondary | ICD-10-CM | POA: Diagnosis not present

## 2021-12-05 DIAGNOSIS — N390 Urinary tract infection, site not specified: Secondary | ICD-10-CM | POA: Diagnosis present

## 2021-12-05 DIAGNOSIS — E039 Hypothyroidism, unspecified: Secondary | ICD-10-CM | POA: Diagnosis not present

## 2021-12-05 DIAGNOSIS — F1010 Alcohol abuse, uncomplicated: Secondary | ICD-10-CM | POA: Diagnosis present

## 2021-12-05 DIAGNOSIS — L89211 Pressure ulcer of right hip, stage 1: Secondary | ICD-10-CM | POA: Diagnosis present

## 2021-12-05 DIAGNOSIS — Y92009 Unspecified place in unspecified non-institutional (private) residence as the place of occurrence of the external cause: Secondary | ICD-10-CM

## 2021-12-05 DIAGNOSIS — I4892 Unspecified atrial flutter: Secondary | ICD-10-CM | POA: Diagnosis present

## 2021-12-05 DIAGNOSIS — A4151 Sepsis due to Escherichia coli [E. coli]: Secondary | ICD-10-CM | POA: Diagnosis present

## 2021-12-05 DIAGNOSIS — I340 Nonrheumatic mitral (valve) insufficiency: Secondary | ICD-10-CM

## 2021-12-05 DIAGNOSIS — M8580 Other specified disorders of bone density and structure, unspecified site: Secondary | ICD-10-CM | POA: Diagnosis present

## 2021-12-05 DIAGNOSIS — E860 Dehydration: Secondary | ICD-10-CM | POA: Diagnosis present

## 2021-12-05 DIAGNOSIS — R58 Hemorrhage, not elsewhere classified: Secondary | ICD-10-CM | POA: Diagnosis not present

## 2021-12-05 DIAGNOSIS — M7989 Other specified soft tissue disorders: Secondary | ICD-10-CM | POA: Diagnosis not present

## 2021-12-05 DIAGNOSIS — Z7901 Long term (current) use of anticoagulants: Secondary | ICD-10-CM | POA: Diagnosis not present

## 2021-12-05 DIAGNOSIS — N182 Chronic kidney disease, stage 2 (mild): Secondary | ICD-10-CM | POA: Diagnosis not present

## 2021-12-05 DIAGNOSIS — D539 Nutritional anemia, unspecified: Secondary | ICD-10-CM | POA: Diagnosis present

## 2021-12-05 DIAGNOSIS — E8770 Fluid overload, unspecified: Secondary | ICD-10-CM | POA: Diagnosis present

## 2021-12-05 DIAGNOSIS — L8990 Pressure ulcer of unspecified site, unspecified stage: Secondary | ICD-10-CM | POA: Diagnosis not present

## 2021-12-05 DIAGNOSIS — I959 Hypotension, unspecified: Secondary | ICD-10-CM | POA: Diagnosis not present

## 2021-12-05 DIAGNOSIS — L89150 Pressure ulcer of sacral region, unstageable: Secondary | ICD-10-CM | POA: Diagnosis present

## 2021-12-05 DIAGNOSIS — K648 Other hemorrhoids: Secondary | ICD-10-CM | POA: Diagnosis present

## 2021-12-05 DIAGNOSIS — M25552 Pain in left hip: Secondary | ICD-10-CM | POA: Diagnosis not present

## 2021-12-05 DIAGNOSIS — Z79890 Hormone replacement therapy (postmenopausal): Secondary | ICD-10-CM

## 2021-12-05 DIAGNOSIS — M6282 Rhabdomyolysis: Secondary | ICD-10-CM | POA: Diagnosis present

## 2021-12-05 DIAGNOSIS — E872 Acidosis, unspecified: Secondary | ICD-10-CM | POA: Diagnosis present

## 2021-12-05 DIAGNOSIS — K5900 Constipation, unspecified: Secondary | ICD-10-CM | POA: Diagnosis not present

## 2021-12-05 DIAGNOSIS — R1310 Dysphagia, unspecified: Secondary | ICD-10-CM | POA: Diagnosis present

## 2021-12-05 DIAGNOSIS — K625 Hemorrhage of anus and rectum: Secondary | ICD-10-CM | POA: Diagnosis not present

## 2021-12-05 DIAGNOSIS — Z681 Body mass index (BMI) 19 or less, adult: Secondary | ICD-10-CM

## 2021-12-05 DIAGNOSIS — Z043 Encounter for examination and observation following other accident: Secondary | ICD-10-CM | POA: Diagnosis not present

## 2021-12-05 DIAGNOSIS — N179 Acute kidney failure, unspecified: Secondary | ICD-10-CM | POA: Diagnosis not present

## 2021-12-05 DIAGNOSIS — R0902 Hypoxemia: Secondary | ICD-10-CM | POA: Diagnosis not present

## 2021-12-05 DIAGNOSIS — I499 Cardiac arrhythmia, unspecified: Secondary | ICD-10-CM | POA: Diagnosis not present

## 2021-12-05 DIAGNOSIS — T796XXA Traumatic ischemia of muscle, initial encounter: Secondary | ICD-10-CM

## 2021-12-05 DIAGNOSIS — I081 Rheumatic disorders of both mitral and tricuspid valves: Secondary | ICD-10-CM | POA: Diagnosis present

## 2021-12-05 DIAGNOSIS — R079 Chest pain, unspecified: Secondary | ICD-10-CM | POA: Diagnosis not present

## 2021-12-05 DIAGNOSIS — R54 Age-related physical debility: Secondary | ICD-10-CM | POA: Diagnosis present

## 2021-12-05 DIAGNOSIS — R64 Cachexia: Secondary | ICD-10-CM | POA: Diagnosis present

## 2021-12-05 DIAGNOSIS — M25522 Pain in left elbow: Secondary | ICD-10-CM | POA: Diagnosis not present

## 2021-12-05 DIAGNOSIS — I4821 Permanent atrial fibrillation: Secondary | ICD-10-CM | POA: Diagnosis present

## 2021-12-05 DIAGNOSIS — Z8673 Personal history of transient ischemic attack (TIA), and cerebral infarction without residual deficits: Secondary | ICD-10-CM

## 2021-12-05 DIAGNOSIS — M8448XA Pathological fracture, other site, initial encounter for fracture: Secondary | ICD-10-CM | POA: Diagnosis present

## 2021-12-05 DIAGNOSIS — I4891 Unspecified atrial fibrillation: Secondary | ICD-10-CM | POA: Diagnosis present

## 2021-12-05 DIAGNOSIS — M7022 Olecranon bursitis, left elbow: Secondary | ICD-10-CM | POA: Diagnosis present

## 2021-12-05 DIAGNOSIS — E871 Hypo-osmolality and hyponatremia: Secondary | ICD-10-CM | POA: Diagnosis present

## 2021-12-05 DIAGNOSIS — M25469 Effusion, unspecified knee: Secondary | ICD-10-CM

## 2021-12-05 DIAGNOSIS — M1711 Unilateral primary osteoarthritis, right knee: Secondary | ICD-10-CM | POA: Diagnosis present

## 2021-12-05 DIAGNOSIS — L89109 Pressure ulcer of unspecified part of back, unspecified stage: Secondary | ICD-10-CM

## 2021-12-05 DIAGNOSIS — L89610 Pressure ulcer of right heel, unstageable: Secondary | ICD-10-CM | POA: Diagnosis present

## 2021-12-05 DIAGNOSIS — L89100 Pressure ulcer of unspecified part of back, unstageable: Secondary | ICD-10-CM | POA: Diagnosis present

## 2021-12-05 DIAGNOSIS — T68XXXA Hypothermia, initial encounter: Secondary | ICD-10-CM | POA: Diagnosis not present

## 2021-12-05 DIAGNOSIS — R531 Weakness: Secondary | ICD-10-CM | POA: Diagnosis not present

## 2021-12-05 DIAGNOSIS — M8588 Other specified disorders of bone density and structure, other site: Secondary | ICD-10-CM | POA: Diagnosis not present

## 2021-12-05 DIAGNOSIS — Z79899 Other long term (current) drug therapy: Secondary | ICD-10-CM

## 2021-12-05 DIAGNOSIS — K900 Celiac disease: Secondary | ICD-10-CM | POA: Diagnosis present

## 2021-12-05 DIAGNOSIS — R6889 Other general symptoms and signs: Secondary | ICD-10-CM | POA: Diagnosis not present

## 2021-12-05 DIAGNOSIS — R000 Tachycardia, unspecified: Secondary | ICD-10-CM | POA: Diagnosis present

## 2021-12-05 DIAGNOSIS — Z743 Need for continuous supervision: Secondary | ICD-10-CM | POA: Diagnosis not present

## 2021-12-05 DIAGNOSIS — W19XXXA Unspecified fall, initial encounter: Secondary | ICD-10-CM

## 2021-12-05 DIAGNOSIS — E876 Hypokalemia: Secondary | ICD-10-CM | POA: Diagnosis present

## 2021-12-05 DIAGNOSIS — M25561 Pain in right knee: Secondary | ICD-10-CM | POA: Diagnosis not present

## 2021-12-05 DIAGNOSIS — E785 Hyperlipidemia, unspecified: Secondary | ICD-10-CM | POA: Diagnosis not present

## 2021-12-05 LAB — CBC WITH DIFFERENTIAL/PLATELET
Abs Immature Granulocytes: 0.13 10*3/uL — ABNORMAL HIGH (ref 0.00–0.07)
Basophils Absolute: 0 10*3/uL (ref 0.0–0.1)
Basophils Relative: 0 %
Eosinophils Absolute: 0 10*3/uL (ref 0.0–0.5)
Eosinophils Relative: 0 %
HCT: 35.2 % — ABNORMAL LOW (ref 39.0–52.0)
Hemoglobin: 11.3 g/dL — ABNORMAL LOW (ref 13.0–17.0)
Immature Granulocytes: 1 %
Lymphocytes Relative: 6 %
Lymphs Abs: 0.9 10*3/uL (ref 0.7–4.0)
MCH: 34.6 pg — ABNORMAL HIGH (ref 26.0–34.0)
MCHC: 32.1 g/dL (ref 30.0–36.0)
MCV: 107.6 fL — ABNORMAL HIGH (ref 80.0–100.0)
Monocytes Absolute: 0.9 10*3/uL (ref 0.1–1.0)
Monocytes Relative: 6 %
Neutro Abs: 12.4 10*3/uL — ABNORMAL HIGH (ref 1.7–7.7)
Neutrophils Relative %: 87 %
Platelets: 194 10*3/uL (ref 150–400)
RBC: 3.27 MIL/uL — ABNORMAL LOW (ref 4.22–5.81)
RDW: 14 % (ref 11.5–15.5)
WBC: 14.3 10*3/uL — ABNORMAL HIGH (ref 4.0–10.5)
nRBC: 0 % (ref 0.0–0.2)

## 2021-12-05 LAB — URINALYSIS, COMPLETE (UACMP) WITH MICROSCOPIC
Bilirubin Urine: NEGATIVE
Glucose, UA: NEGATIVE mg/dL
Ketones, ur: 40 mg/dL — AB
Nitrite: POSITIVE — AB
Protein, ur: 30 mg/dL — AB
RBC / HPF: >50 RBC/hpf (ref 0–5)
Specific Gravity, Urine: 1.025 (ref 1.005–1.030)
WBC, UA: >50 WBC/hpf (ref 0–5)
pH: 5.5 (ref 5.0–8.0)

## 2021-12-05 LAB — COMPREHENSIVE METABOLIC PANEL
ALT: 58 U/L — ABNORMAL HIGH (ref 0–44)
AST: 103 U/L — ABNORMAL HIGH (ref 15–41)
Albumin: 3.3 g/dL — ABNORMAL LOW (ref 3.5–5.0)
Alkaline Phosphatase: 67 U/L (ref 38–126)
Anion gap: 17 — ABNORMAL HIGH (ref 5–15)
BUN: 68 mg/dL — ABNORMAL HIGH (ref 8–23)
CO2: 16 mmol/L — ABNORMAL LOW (ref 22–32)
Calcium: 8.6 mg/dL — ABNORMAL LOW (ref 8.9–10.3)
Chloride: 104 mmol/L (ref 98–111)
Creatinine, Ser: 1.43 mg/dL — ABNORMAL HIGH (ref 0.61–1.24)
GFR, Estimated: 47 mL/min — ABNORMAL LOW (ref >60)
Glucose, Bld: 110 mg/dL — ABNORMAL HIGH (ref 70–99)
Potassium: 4.5 mmol/L (ref 3.5–5.1)
Sodium: 137 mmol/L (ref 135–145)
Total Bilirubin: 2 mg/dL — ABNORMAL HIGH (ref 0.3–1.2)
Total Protein: 6.8 g/dL (ref 6.5–8.1)

## 2021-12-05 LAB — RESP PANEL BY RT-PCR (FLU A&B, COVID) ARPGX2
Influenza A by PCR: NEGATIVE
Influenza B by PCR: NEGATIVE
SARS Coronavirus 2 by RT PCR: NEGATIVE

## 2021-12-05 LAB — LACTIC ACID, PLASMA
Lactic Acid, Venous: 2.7 mmol/L (ref 0.5–1.9)
Lactic Acid, Venous: 1.9 mmol/L (ref 0.5–1.9)

## 2021-12-05 LAB — CK: Total CK: 1889 U/L — ABNORMAL HIGH (ref 49–397)

## 2021-12-05 LAB — TSH: TSH: 2.229 u[IU]/mL (ref 0.350–4.500)

## 2021-12-05 MED ORDER — SODIUM CHLORIDE 0.9 % IV BOLUS
500.0000 mL | Freq: Once | INTRAVENOUS | Status: AC
  Administered 2021-12-05: 500 mL via INTRAVENOUS

## 2021-12-05 MED ORDER — ENSURE ENLIVE PO LIQD
237.0000 mL | Freq: Three times a day (TID) | ORAL | Status: DC
  Administered 2021-12-05 – 2021-12-15 (×26): 237 mL via ORAL
  Filled 2021-12-05 (×3): qty 237

## 2021-12-05 MED ORDER — STERILE WATER FOR INJECTION IV SOLN
INTRAVENOUS | Status: AC
  Filled 2021-12-05: qty 1000

## 2021-12-05 MED ORDER — SODIUM CHLORIDE 0.9 % IV SOLN: INTRAVENOUS | Status: DC

## 2021-12-05 MED ORDER — SODIUM CHLORIDE 0.9 % IV BOLUS
1000.0000 mL | Freq: Once | INTRAVENOUS | Status: AC
  Administered 2021-12-05: 1000 mL via INTRAVENOUS

## 2021-12-05 MED ORDER — STERILE WATER FOR INJECTION IV SOLN
125.0000 mL/h | INTRAVENOUS | Status: DC
  Administered 2021-12-05: 125 mL/h via INTRAVENOUS
  Filled 2021-12-05 (×2): qty 1000

## 2021-12-05 MED ORDER — AMIODARONE HCL IN DEXTROSE 360-4.14 MG/200ML-% IV SOLN
30.0000 mg/h | INTRAVENOUS | Status: DC
  Administered 2021-12-05 – 2021-12-06 (×2): 30 mg/h via INTRAVENOUS
  Filled 2021-12-05: qty 200

## 2021-12-05 MED ORDER — METOPROLOL SUCCINATE ER 25 MG PO TB24
25.0000 mg | ORAL_TABLET | Freq: Every day | ORAL | Status: DC
  Administered 2021-12-07 – 2021-12-08 (×2): 25 mg via ORAL
  Filled 2021-12-05 (×4): qty 1

## 2021-12-05 MED ORDER — AMIODARONE LOAD VIA INFUSION
150.0000 mg | Freq: Once | INTRAVENOUS | Status: AC
  Administered 2021-12-05: 150 mg via INTRAVENOUS
  Filled 2021-12-05: qty 83.34

## 2021-12-05 MED ORDER — AMIODARONE HCL IN DEXTROSE 360-4.14 MG/200ML-% IV SOLN
60.0000 mg/h | INTRAVENOUS | Status: DC
  Administered 2021-12-05 (×2): 60 mg/h via INTRAVENOUS
  Filled 2021-12-05 (×2): qty 200

## 2021-12-05 MED ORDER — LEVOTHYROXINE SODIUM 50 MCG PO TABS
50.0000 ug | ORAL_TABLET | Freq: Every day | ORAL | Status: DC
  Administered 2021-12-06 – 2021-12-15 (×10): 50 ug via ORAL
  Filled 2021-12-05 (×10): qty 1

## 2021-12-05 MED ORDER — APIXABAN 5 MG PO TABS
5.0000 mg | ORAL_TABLET | Freq: Two times a day (BID) | ORAL | Status: DC
  Administered 2021-12-05 – 2021-12-10 (×11): 5 mg via ORAL
  Filled 2021-12-05 (×11): qty 1

## 2021-12-05 MED ORDER — METOPROLOL TARTRATE 5 MG/5ML IV SOLN: 2.5000 mg | INTRAVENOUS | Status: DC | PRN

## 2021-12-05 MED ORDER — ACETAMINOPHEN 325 MG PO TABS
650.0000 mg | ORAL_TABLET | Freq: Four times a day (QID) | ORAL | Status: DC | PRN
  Administered 2021-12-10: 21:00:00 650 mg via ORAL
  Filled 2021-12-05: qty 2

## 2021-12-05 MED ORDER — ACETAMINOPHEN 650 MG RE SUPP: 650.0000 mg | Freq: Four times a day (QID) | RECTAL | Status: DC | PRN

## 2021-12-05 MED ORDER — SODIUM CHLORIDE 0.9% FLUSH
3.0000 mL | Freq: Two times a day (BID) | INTRAVENOUS | Status: DC
  Administered 2021-12-05 – 2021-12-15 (×19): 3 mL via INTRAVENOUS

## 2021-12-05 MED ORDER — CEFTRIAXONE SODIUM 1 G IJ SOLR
1.0000 g | INTRAMUSCULAR | Status: DC
  Administered 2021-12-05 – 2021-12-07 (×3): 1 g via INTRAVENOUS
  Filled 2021-12-05 (×3): qty 10

## 2021-12-05 MED ORDER — HEPARIN SODIUM (PORCINE) 5000 UNIT/ML IJ SOLN: 5000.0000 [IU] | Freq: Three times a day (TID) | INTRAMUSCULAR | Status: DC

## 2021-12-05 NOTE — ED Notes (Signed)
Bair hugger placed on patient.

## 2021-12-05 NOTE — H&P (Signed)
History and Physical    ZUBIN PONTILLO NGE:[id number] DOB: 20-Feb-1930 DOA: 12/05/2021  PCP: [name], MD   Patient coming from: Home   Chief Complaint: [place] and couldn't get up   HPI: [name] is a pleasant 87 y.o. male with medical history significant for atrial fibrillation on Eliquis, CKD stage II, hypothyroidism, mitral and tricuspid cuspid regurgitation, who presented to the emergency department after falling at home and being unable to get up.  Patient reports that he woke early morning of 12/02/2021, lost his footing, and fell onto his left side.  He felt too generally weak to get up or crawl to the phone, and remained on his hardwood floor until he was discovered by his son last night.  EMS was called and found the patient to be tachycardic to the 160s.  He was given 20 mg diltiazem and 700 mL of saline prior to arrival in the ED.  Patient denies any chest pain, fevers, chills, cough, shortness of breath, dysuria, or abdominal pain.  He denies any headache or focal numbness or weakness.  States that he had some pain in his left elbow that has essentially resolved since arriving in the hospital.  ED Course: Upon arrival to the ED, patient is found to have an initial rectal temperature of 33.7 C, saturating mid 90s on room air, and tachycardic to the 130s with stable blood pressure.  EKG features atrial fibrillation with RVR, rate 145.  No acute findings on CT head and cervical spine.  Chest x-ray negative for acute cardiopulmonary disease.  Plain radiographs were obtained of the left elbow, left hip and pelvis, and thoracic and lumbar spine which were negative for acute fracture or bony abnormality but notable for soft tissue swelling about the left elbow and multiple thoracolumbar insufficiency fractures that appear remote.  Serum CK is elevated to 1889, creatinine is 1.43, and serum bicarbonate 16 with anion gap 17.  Blood culture was collected in the ED and patient was given 1.5  L of saline.   Review of Systems:  All other systems reviewed and apart from HPI, are negative.  Past Medical History:  Diagnosis Date   Alcohol abuse    Allergic dermatitis    Atrial fibrillation (HCC)    Celiac disease    Constipation    CVA (cerebral infarction) 1987   blood clot, right side and speech issues, recovered quickly   DJD (degenerative joint disease)    Macrocytic anemia    Nonspecific (abnormal) findings on radiological and other examination of other intrathoracic organs    Nonspecific abnormal results of thyroid function study    Olecranon bursitis    Prostate cancer ([place])    Pure hypercholesterolemia    Unspecified hemorrhoids without mention of complication     Past Surgical History:  Procedure Laterality Date   COLONOSCOPY     KYPHOPLASTY N/A 07/13/2018   Procedure: KYPHOPLASTY L2, L3, L5;  Surgeon: Melina Schools, MD;  Location: [place];  Service: Orthopedics;  Laterality: N/A;  120 mins   prostate biopsy and see implantation     WRIST FRACTURE SURGERY      Social History:   reports that he has never smoked. He has never used smokeless tobacco. He reports current alcohol use. He reports that he does not use drugs.  No Known Allergies  Family History  Problem Relation Age of Onset   Heart disease Other        No family history  Prior to Admission medications   Medication Sig Start Date End Date Taking? Authorizing Provider  apixaban (ELIQUIS) 5 MG TABS tablet Take 1 tablet (5 mg total) by mouth 2 (two) times daily. 03/26/21   Lelon Perla, MD  atorvastatin (LIPITOR) 20 MG tablet TAKE 1 TABLET BY MOUTH EVERY DAY 09/30/21   [name], MD  cyanocobalamin 1000 MCG tablet Take 1,000 mcg by mouth daily.     [provider]  dapsone 100 MG tablet Take 0.5 tablets (50 mg total) by mouth daily. 07/01/21   [name], MD  furosemide (LASIX) 20 MG tablet TAKE 1 TABLET BY MOUTH EVERY DAY 12/24/20   Lelon Perla, MD  levothyroxine  (SYNTHROID) 50 MCG tablet TAKE 1 TABLET BY MOUTH EVERY DAY 09/07/21   [name], MD  metoprolol succinate (TOPROL-XL) 25 MG 24 hr tablet TAKE 1 TABLET BY MOUTH EVERY DAY 06/18/21   Lelon Perla, MD    Physical Exam: Vitals:   12/05/21 0300 12/05/21 0306 12/05/21 0315 12/05/21 0330  BP: 97/72   103/74  Pulse: (!) 141  (!) 137 (!) 143  Resp: 18  16 16   Temp:      TempSrc:      SpO2: 93%  92% 94%  Weight:  66.7 kg    Height:  6' (1.829 m)      Constitutional: NAD, calm  Eyes: PERTLA, lids and conjunctivae normal ENMT: Mucous membranes are dry. Posterior pharynx clear of any exudate or lesions.   Neck: supple, no masses  Respiratory: no wheezing, no crackles. No accessory muscle use.  Cardiovascular: Rate  ~120 and irregularly irregular. No extremity edema.   Abdomen: No distension, no tenderness, soft. Bowel sounds active.  Musculoskeletal: no clubbing / cyanosis. No joint deformity upper and lower extremities.   Skin: Ecchymosis and edema about the left elbow. Pressure injury to low back. Warm, dry, well-perfused. Neurologic: CN 2-12 grossly intact. Moving all extremities. Alert and oriented.  Psychiatric: Pleasant. Cooperative.    Labs and Imaging on Admission: I have personally reviewed following labs and imaging studies  CBC: Recent Labs  Lab 12/05/21 0138  WBC 14.3*  NEUTROABS 12.4*  HGB 11.3*  HCT 35.2*  MCV 107.6*  PLT 532   Basic Metabolic Panel: Recent Labs  Lab 12/05/21 0138  NA 137  K 4.5  CL 104  CO2 16*  GLUCOSE 110*  BUN 68*  CREATININE 1.43*  CALCIUM 8.6*   GFR: Estimated Creatinine Clearance: 33 mL/min (A) (by C-G formula based on SCr of 1.43 mg/dL (H)). Liver Function Tests: Recent Labs  Lab 12/05/21 0138  AST 103*  ALT 58*  ALKPHOS 67  BILITOT 2.0*  PROT 6.8  ALBUMIN 3.3*   No results for input(s): LIPASE, AMYLASE in the last 168 hours. No results for input(s): AMMONIA in the last 168 hours. Coagulation Profile: No  results for input(s): INR, PROTIME in the last 168 hours. Cardiac Enzymes: Recent Labs  Lab 12/05/21 0138  CKTOTAL 1,889*   BNP (last 3 results) No results for input(s): PROBNP in the last 8760 hours. HbA1C: No results for input(s): HGBA1C in the last 72 hours. CBG: No results for input(s): GLUCAP in the last 168 hours. Lipid Profile: No results for input(s): CHOL, HDL, LDLCALC, TRIG, CHOLHDL, LDLDIRECT in the last 72 hours. Thyroid Function Tests: No results for input(s): TSH, T4TOTAL, FREET4, T3FREE, THYROIDAB in the last 72 hours. Anemia Panel: No results for input(s): VITAMINB12, FOLATE, FERRITIN, TIBC, IRON, RETICCTPCT  in the last 72 hours. Urine analysis:    Component Value Date/Time   COLORURINE YELLOW 11/25/2020 1221   APPEARANCEUR Cloudy (A) 11/25/2020 1221   LABSPEC 1.020 11/25/2020 1221   PHURINE 6.5 11/25/2020 1221   GLUCOSEU NEGATIVE 11/25/2020 1221   HGBUR SMALL (A) 11/25/2020 1221   BILIRUBINUR NEGATIVE 11/25/2020 1221   KETONESUR NEGATIVE 11/25/2020 1221   PROTEINUR NEGATIVE 06/26/2009 0359   UROBILINOGEN 1.0 11/25/2020 1221   NITRITE POSITIVE (A) 11/25/2020 1221   LEUKOCYTESUR LARGE (A) 11/25/2020 1221   Sepsis Labs: @LABRCNTIP (procalcitonin:4,lacticidven:4) ) Recent Results (from the past 240 hour(s))  Resp Panel by RT-PCR (Flu A&B, Covid) Nasopharyngeal Swab     Status: None   Collection Time: 12/05/21  2:30 AM   Specimen: Nasopharyngeal Swab; Nasopharyngeal(NP) swabs in vial transport medium  Result Value Ref Range Status   SARS Coronavirus 2 by RT PCR NEGATIVE NEGATIVE Final    Comment: (NOTE) SARS-CoV-2 target nucleic acids are NOT DETECTED.  The SARS-CoV-2 RNA is generally detectable in upper respiratory specimens during the acute phase of infection. The lowest concentration of SARS-CoV-2 viral copies this assay can detect is 138 copies/mL. A negative result does not preclude SARS-Cov-2 infection and should not be used as the sole basis for  treatment or other patient management decisions. A negative result may occur with  improper specimen collection/handling, submission of specimen other than nasopharyngeal swab, presence of viral mutation(s) within the areas targeted by this assay, and inadequate number of viral copies(<138 copies/mL). A negative result must be combined with clinical observations, patient history, and epidemiological information. The expected result is Negative.  Fact Sheet for Patients:  EntrepreneurPulse.com.au  Fact Sheet for Healthcare Providers:  IncredibleEmployment.be  This test is no t yet approved or cleared by the [place] FDA and  has been authorized for detection and/or diagnosis of SARS-CoV-2 by FDA under an Emergency Use Authorization (EUA). This EUA will remain  in effect (meaning this test can be used) for the duration of the COVID-19 declaration under Section 564(b)(1) of the Act, 21 U.S.C.section 360bbb-3(b)(1), unless the authorization is terminated  or revoked sooner.       Influenza A by PCR NEGATIVE NEGATIVE Final   Influenza B by PCR NEGATIVE NEGATIVE Final    Comment: (NOTE) The Xpert Xpress SARS-CoV-2/FLU/RSV plus assay is intended as an aid in the diagnosis of influenza from Nasopharyngeal swab specimens and should not be used as a sole basis for treatment. Nasal washings and aspirates are unacceptable for Xpert Xpress SARS-CoV-2/FLU/RSV testing.  Fact Sheet for Patients: EntrepreneurPulse.com.au  Fact Sheet for Healthcare Providers: IncredibleEmployment.be  This test is not yet approved or cleared by the [place] FDA and has been authorized for detection and/or diagnosis of SARS-CoV-2 by FDA under an Emergency Use Authorization (EUA). This EUA will remain in effect (meaning this test can be used) for the duration of the COVID-19 declaration under Section 564(b)(1) of the Act, 21  U.S.C. section 360bbb-3(b)(1), unless the authorization is terminated or revoked.  Performed at [facility], [place] [address], [place], [place] [address]      Radiological Exams on Admission: DG Chest 1 View  Result Date: 12/05/2021 CLINICAL DATA:  Fall, chest pain EXAM: CHEST  1 VIEW COMPARISON:  07/20/2013 FINDINGS: Lungs are clear. No pneumothorax or pleural effusion. Cardiac size is within normal limits when accounting for supine positioning. Pulmonary vascularity is normal. Multiple thoracic compression deformities are noted. IMPRESSION: Multiple thoracic compression deformities, age indeterminate. No radiographic evidence of  acute cardiopulmonary disease. Electronically Signed   By: Fidela Salisbury M.D.   On: 12/05/2021 03:11   DG Lumbar Spine Complete  Result Date: 12/05/2021 CLINICAL DATA:  Fall, left hip bruising, low back injury EXAM: LUMBAR SPINE - COMPLETE 4+ VIEW COMPARISON:  MRI 06/27/2018 FINDINGS: The osseous structures are diffusely markedly osteopenic. Interval kyphoplasty of L2, L3, and L5. There is interval development of moderate to severe anterior wedge compression fractures of T11, T12, and L1, likely remote in nature given the lack of associated paravertebral soft tissue swelling. No listhesis. The paraspinal soft tissues are unremarkable save for vascular calcifications within the abdominal aorta. IMPRESSION: Multiple insufficiency fractures of the thoracolumbar spine, as described above, likely remote in nature. No definite acute fracture or listhesis. Severe osteopenia. Electronically Signed   By: Fidela Salisbury M.D.   On: 12/05/2021 03:05   DG Elbow Complete Left  Result Date: 12/05/2021 CLINICAL DATA:  Fall, left elbow pain EXAM: LEFT ELBOW - COMPLETE 3+ VIEW COMPARISON:  None. FINDINGS: Normal alignment. No acute fracture or dislocation. Mild chondrocalcinosis noted, likely degenerative in nature. Obliquity on lateral examination precludes evaluation for an  effusion. Mild soft tissue swelling posterior to the olecranon. IMPRESSION: Soft tissue swelling.  No acute fracture or dislocation. Electronically Signed   By: Fidela Salisbury M.D.   On: 12/05/2021 03:08   CT HEAD WO CONTRAST (5MM)  Result Date: 12/05/2021 CLINICAL DATA:  Fall EXAM: CT HEAD WITHOUT CONTRAST CT CERVICAL SPINE WITHOUT CONTRAST TECHNIQUE: Multidetector CT imaging of the head and cervical spine was performed following the standard protocol without intravenous contrast. Multiplanar CT image reconstructions of the cervical spine were also generated. COMPARISON:  CT head 06/26/2009, no prior CT of the cervical spine. FINDINGS: CT HEAD FINDINGS Brain: No evidence of acute infarction, hemorrhage, cerebral edema, mass, mass effect, or midline shift. No hydrocephalus or extra-axial fluid collection. Periventricular white matter changes, likely the sequela of chronic small vessel ischemic disease. Ventricles and sulci are commensurate in size. Vascular: No hyperdense vessel. Skull: Normal. Negative for fracture or focal lesion. Sinuses/Orbits: No acute finding. Status post bilateral lens replacements. Other: The mastoid air cells are well aerated. CT CERVICAL SPINE FINDINGS Alignment: No listhesis. Skull base and vertebrae: No acute fracture or suspicious osseous lesion. Osteopenia. Soft tissues and spinal canal: No prevertebral fluid or swelling. No visible canal hematoma. Disc levels: Multilevel degenerative changes, without significant spinal canal stenosis. Multilevel facet and uncovertebral hypertrophy, which causes severe bilateral neural foraminal narrowing at C5-C6 and C6-C7. Upper chest: Negative. Other: None. IMPRESSION: 1.  No acute intracranial process. 2.  No acute fracture or traumatic listhesis in the cervical spine. Electronically Signed   By: Merilyn Baba M.D.   On: 12/05/2021 02:37   CT Cervical Spine Wo Contrast  Result Date: 12/05/2021 CLINICAL DATA:  Fall EXAM: CT HEAD WITHOUT  CONTRAST CT CERVICAL SPINE WITHOUT CONTRAST TECHNIQUE: Multidetector CT imaging of the head and cervical spine was performed following the standard protocol without intravenous contrast. Multiplanar CT image reconstructions of the cervical spine were also generated. COMPARISON:  CT head 06/26/2009, no prior CT of the cervical spine. FINDINGS: CT HEAD FINDINGS Brain: No evidence of acute infarction, hemorrhage, cerebral edema, mass, mass effect, or midline shift. No hydrocephalus or extra-axial fluid collection. Periventricular white matter changes, likely the sequela of chronic small vessel ischemic disease. Ventricles and sulci are commensurate in size. Vascular: No hyperdense vessel. Skull: Normal. Negative for fracture or focal lesion. Sinuses/Orbits: No acute finding. Status post bilateral  lens replacements. Other: The mastoid air cells are well aerated. CT CERVICAL SPINE FINDINGS Alignment: No listhesis. Skull base and vertebrae: No acute fracture or suspicious osseous lesion. Osteopenia. Soft tissues and spinal canal: No prevertebral fluid or swelling. No visible canal hematoma. Disc levels: Multilevel degenerative changes, without significant spinal canal stenosis. Multilevel facet and uncovertebral hypertrophy, which causes severe bilateral neural foraminal narrowing at C5-C6 and C6-C7. Upper chest: Negative. Other: None. IMPRESSION: 1.  No acute intracranial process. 2.  No acute fracture or traumatic listhesis in the cervical spine. Electronically Signed   By: Merilyn Baba M.D.   On: 12/05/2021 02:37   DG HIP UNILAT W OR W/O PELVIS 2-3 VIEWS LEFT  Result Date: 12/05/2021 CLINICAL DATA:  Fall, left hip pain EXAM: DG HIP (WITH OR WITHOUT PELVIS) 2-3V LEFT COMPARISON:  None. FINDINGS: The osseous structures are are osteopenic. No acute fracture or dislocation. Hip joint spaces appear preserved. Vascular calcifications are seen within the pelvis and medial left thigh. Brachytherapy seeds are seen within  the expected prostate gland. IMPRESSION: No acute fracture or dislocation. Osteopenia. Electronically Signed   By: Fidela Salisbury M.D.   On: 12/05/2021 03:07    EKG: Independently reviewed. Atrial fibrillation with RVR, rate 145.   Assessment/Plan:   1. Rhabdomyolysis; AKI superimposed on CKD II; metabolic acidosis  - Presents after falling early am of 12/14 and being too generally weak to get up and is found to have CK of 1889, BUN 68 and SCr 1.43 (22 & 0.87 previously), and bicarbonate 16   - Fluid resuscitated with NS in ED  - Continue IVF hydration with isotonic bicarbonate, follow serial chem panels    2. Atrial fibrillation with RVR   - Rate 160s with EMS and treated with 20 mg diltiazem prior to arrival  - Rate improving with IVF hydration, continue Eliquis and metoprolol   3. Mitral and tricuspid regurgitation  - Severe TR and mild MR on TTE in May 2022  - Followed by cardiology, managed conservatively with diuretics - Hold Lasix for now while hydrating cautiously    4. Hypothermia  - Treated with active external warming briefly in ED, now resolved    5. Hypothyroidism  - Check TSH in light of general weakness and hypothermia, continue Synthroid     DVT prophylaxis: Eliquis  Code Status: Full, discussed with patient  Level of Care: Level of care: Progressive Family Communication: None present  Disposition Plan:  Patient is from: home  Anticipated d/c is to: TBD Anticipated d/c date is: 12/08/21  Patient currently: Pending improvement in renal function, PT eval  Consults called: none  Admission status: Inpatient     Vianne Bulls, MD [facility]  12/05/2021, 5:54 AM

## 2021-12-05 NOTE — Consult Note (Signed)
Cardiology Consult Note:   Patient ID: [name] MRN: [id number]; DOB: 08-18-1930   Admission date: 12/05/2021  PCP:  [name], MD   [facility] Providers Cardiologist:  Kirk Ruths, MD        Chief Complaint:  AF RVR  Patient Profile:   [name] is a 87 y.o. male with Mild MR, Moderate RV, Mild TAA, Persistent atrial fibrillation (Prior stroke, CHADSVASC 4) and hypothyroidism who is being seen 12/05/2021 for the evaluation of AF RVR in the setting of hypotension.  History of Present Illness:   [name] presented to the ED after a fall.  Patient notes he is feeling much better now.  Has had no chest pain, chest pressure, chest tightness, chest stinging .  He lost his footing and fell 12/02/21.  He was not able to crawl to his phone.  He was found PM 12/04/21.  Has had persistent tachycardia, he was unable to start diltiazem drip because of BP.   No shortness of breath, DOE .  No PND or orthopnea.  Surprising denied pain despite his rhabdomyolysis.   No syncope or near syncope (he had a mechanical fall).  Inpatient evaluation because of AF RVR; unable to tolerate AV nodal agents.   In the ED/OSH patient received medications 20 diltiazem with no response.  Key labs notable for CK 1889, Creatinine 1.5 L.  Telemetry shows AF RVR.  Cardiology called for evaluation.     Past Medical History:  Diagnosis Date   Alcohol abuse    Allergic dermatitis    Atrial fibrillation (HCC)    Celiac disease    Constipation    CVA (cerebral infarction) 1987   blood clot, right side and speech issues, recovered quickly   DJD (degenerative joint disease)    Macrocytic anemia    Nonspecific (abnormal) findings on radiological and other examination of other intrathoracic organs    Nonspecific abnormal results of thyroid function study    Olecranon bursitis    Prostate cancer ([place])    Pure hypercholesterolemia    Unspecified hemorrhoids without mention of complication      Past Surgical History:  Procedure Laterality Date   COLONOSCOPY     KYPHOPLASTY N/A 07/13/2018   Procedure: KYPHOPLASTY L2, L3, L5;  Surgeon: Melina Schools, MD;  Location: [place];  Service: Orthopedics;  Laterality: N/A;  120 mins   prostate biopsy and see implantation     WRIST FRACTURE SURGERY       Medications Prior to Admission: Prior to Admission medications   Medication Sig Start Date End Date Taking? Authorizing Provider  apixaban (ELIQUIS) 5 MG TABS tablet Take 1 tablet (5 mg total) by mouth 2 (two) times daily. 03/26/21  Yes Lelon Perla, MD  atorvastatin (LIPITOR) 20 MG tablet TAKE 1 TABLET BY MOUTH EVERY DAY Patient taking differently: Take 20 mg by mouth daily. 09/30/21  Yes [name], MD  cyanocobalamin 1000 MCG tablet Take 1,000 mcg by mouth daily.    Yes [provider]  dapsone 100 MG tablet Take 0.5 tablets (50 mg total) by mouth daily. 07/01/21  Yes [name], MD  furosemide (LASIX) 20 MG tablet TAKE 1 TABLET BY MOUTH EVERY DAY Patient taking differently: Take 20 mg by mouth daily. 12/24/20  Yes Lelon Perla, MD  levothyroxine (SYNTHROID) 50 MCG tablet TAKE 1 TABLET BY MOUTH EVERY DAY Patient taking differently: Take 50 mcg by mouth daily before breakfast. 09/07/21  Yes [name],  MD  metoprolol succinate (TOPROL-XL) 25 MG 24 hr tablet TAKE 1 TABLET BY MOUTH EVERY DAY Patient taking differently: Take 25 mg by mouth daily. 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  Tobacco Use   Smoking status: Never   Smokeless tobacco: Never  Vaping Use   Vaping Use: Never used  Substance and Sexual Activity   Alcohol use: Yes    Comment:  occasional (heavy drinker in the past)   Drug use: Never   Sexual activity: Never  Other Topics Concern   Not on file  Social History Narrative   Not on file   Social Determinants of Health   Financial Resource Strain: Low Risk    Difficulty of Paying Living Expenses: Not hard at all  Food Insecurity: No Food Insecurity   Worried About Charity fundraiser in the Last Year: Never true   [place] in the Last Year: Never true  Transportation Needs: No Transportation Needs   Lack of Transportation (Medical): No   Lack of Transportation (Non-Medical): No  Physical Activity: Insufficiently Active   Days of Exercise per Week: 6 days   Minutes of Exercise per Session: 20 min  Stress: No Stress Concern Present   Feeling of Stress : Not at all  Social Connections: Not on file  Intimate Partner Violence: Not on file    Family History:   The patient's family history includes Heart disease in an other family member.    ROS:  Please see the history of present illness.  All other ROS reviewed and negative.     Physical Exam/Data:   Vitals:   12/05/21 1230 12/05/21 1300 12/05/21 1330 12/05/21 1400  BP: 108/76 110/81 95/75 97/68   Pulse: (!) 127 (!) 154 (!) 164 (!) 152  Resp: 19 18 18 18   Temp:      TempSrc:      SpO2: 97% 96% 97% 96%  Weight:      Height:        Intake/Output Summary (Last 24 hours) at 12/05/2021 1432 Last data filed at 12/05/2021 0530 Gross per 24 hour  Intake 300 ml  Output --  Net 300 ml   Last 3 Weights 12/05/2021 07/07/2021 03/26/2021  Weight (lbs) 147 lb 0.8 oz 147 lb 149 lb 12.8 oz  Weight (kg) 66.7 kg 66.679 kg 67.949 kg     Body mass index is 19.94 kg/m.  General:  Thin cachectic male in NAD HEENT: normal Neck: no JVD Vascular: Distal pulses 2+ bilaterally   Cardiac:  normal S1, S2; IRIR tachycardia with II/VI holosystolic murmur Lungs:  clear to auscultation bilaterally, no wheezing, rhonchi or rales  Abd: soft, nontender, no  hepatomegaly  Ext: no edema Musculoskeletal:  No deformities Skin: warm and dry  Neuro:  CNs 2-12 intact, no focal abnormalities noted Psych:  Normal affect    EKG:  The ECG that was done  was personally reviewed and demonstrates AF RVR  Laboratory Data:  High Sensitivity Troponin:  No results for input(s): TROPONINIHS in the last 720 hours.    Chemistry Recent Labs  Lab 12/05/21 [phone]  NA 137  K 4.5  CL 104  CO2 16*  GLUCOSE 110*  BUN 68*  CREATININE 1.43*  CALCIUM 8.6*  GFRNONAA 47*  ANIONGAP 17*    Recent Labs  Lab 12/05/21 0138  PROT 6.8  ALBUMIN 3.3*  AST 103*  ALT 58*  ALKPHOS 67  BILITOT 2.0*   Lipids No results for input(s): CHOL, TRIG, HDL, LABVLDL, LDLCALC, CHOLHDL in the last 168 hours. Hematology Recent Labs  Lab 12/05/21 0138  WBC 14.3*  RBC 3.27*  HGB 11.3*  HCT 35.2*  MCV 107.6*  MCH 34.6*  MCHC 32.1  RDW 14.0  PLT 194   Thyroid No results for input(s): TSH, FREET4 in the last 168 hours. BNPNo results for input(s): BNP, PROBNP in the last 168 hours.  DDimer No results for input(s): DDIMER in the last 168 hours.   Radiology/Studies:  DG Chest 1 View  Result Date: 12/05/2021 CLINICAL DATA:  Fall, chest pain EXAM: CHEST  1 VIEW COMPARISON:  07/20/2013 FINDINGS: Lungs are clear. No pneumothorax or pleural effusion. Cardiac size is within normal limits when accounting for supine positioning. Pulmonary vascularity is normal. Multiple thoracic compression deformities are noted. IMPRESSION: Multiple thoracic compression deformities, age indeterminate. No radiographic evidence of acute cardiopulmonary disease. Electronically Signed   By: Fidela Salisbury M.D.   On: 12/05/2021 03:11   DG Lumbar Spine Complete  Result Date: 12/05/2021 CLINICAL DATA:  Fall, left hip bruising, low back injury EXAM: LUMBAR SPINE - COMPLETE 4+ VIEW COMPARISON:  MRI 06/27/2018 FINDINGS: The osseous structures are diffusely markedly osteopenic. Interval kyphoplasty of L2,  L3, and L5. There is interval development of moderate to severe anterior wedge compression fractures of T11, T12, and L1, likely remote in nature given the lack of associated paravertebral soft tissue swelling. No listhesis. The paraspinal soft tissues are unremarkable save for vascular calcifications within the abdominal aorta. IMPRESSION: Multiple insufficiency fractures of the thoracolumbar spine, as described above, likely remote in nature. No definite acute fracture or listhesis. Severe osteopenia. Electronically Signed   By: Fidela Salisbury M.D.   On: 12/05/2021 03:05   DG Elbow Complete Left  Result Date: 12/05/2021 CLINICAL DATA:  Fall, left elbow pain EXAM: LEFT ELBOW - COMPLETE 3+ VIEW COMPARISON:  None. FINDINGS: Normal alignment. No acute fracture or dislocation. Mild chondrocalcinosis noted, likely degenerative in nature. Obliquity on lateral examination precludes evaluation for an effusion. Mild soft tissue swelling posterior to the olecranon. IMPRESSION: Soft tissue swelling.  No acute fracture or dislocation. Electronically Signed   By: Fidela Salisbury M.D.   On: 12/05/2021 03:08   CT HEAD WO CONTRAST (5MM)  Result Date: 12/05/2021 CLINICAL DATA:  Fall EXAM: CT HEAD WITHOUT CONTRAST CT CERVICAL SPINE WITHOUT CONTRAST TECHNIQUE: Multidetector CT imaging of the head and cervical spine was performed following the standard protocol without intravenous contrast. Multiplanar CT image reconstructions of the cervical spine were also generated. COMPARISON:  CT head 06/26/2009, no prior CT of the cervical spine. FINDINGS: CT HEAD FINDINGS Brain: No evidence of acute infarction, hemorrhage, cerebral edema, mass, mass effect, or midline shift. No hydrocephalus or extra-axial fluid collection. Periventricular white matter changes, likely the sequela of chronic small vessel ischemic disease. Ventricles and sulci are commensurate in size. Vascular: No hyperdense vessel. Skull: Normal. Negative for fracture  or focal lesion. Sinuses/Orbits: No acute finding. Status post bilateral lens replacements. Other: The mastoid air cells are well aerated. CT CERVICAL SPINE FINDINGS Alignment: No listhesis. Skull base and vertebrae: No acute fracture or suspicious  osseous lesion. Osteopenia. Soft tissues and spinal canal: No prevertebral fluid or swelling. No visible canal hematoma. Disc levels: Multilevel degenerative changes, without significant spinal canal stenosis. Multilevel facet and uncovertebral hypertrophy, which causes severe bilateral neural foraminal narrowing at C5-C6 and C6-C7. Upper chest: Negative. Other: None. IMPRESSION: 1.  No acute intracranial process. 2.  No acute fracture or traumatic listhesis in the cervical spine. Electronically Signed   By: Merilyn Baba M.D.   On: 12/05/2021 02:37   CT Cervical Spine Wo Contrast  Result Date: 12/05/2021 CLINICAL DATA:  Fall EXAM: CT HEAD WITHOUT CONTRAST CT CERVICAL SPINE WITHOUT CONTRAST TECHNIQUE: Multidetector CT imaging of the head and cervical spine was performed following the standard protocol without intravenous contrast. Multiplanar CT image reconstructions of the cervical spine were also generated. COMPARISON:  CT head 06/26/2009, no prior CT of the cervical spine. FINDINGS: CT HEAD FINDINGS Brain: No evidence of acute infarction, hemorrhage, cerebral edema, mass, mass effect, or midline shift. No hydrocephalus or extra-axial fluid collection. Periventricular white matter changes, likely the sequela of chronic small vessel ischemic disease. Ventricles and sulci are commensurate in size. Vascular: No hyperdense vessel. Skull: Normal. Negative for fracture or focal lesion. Sinuses/Orbits: No acute finding. Status post bilateral lens replacements. Other: The mastoid air cells are well aerated. CT CERVICAL SPINE FINDINGS Alignment: No listhesis. Skull base and vertebrae: No acute fracture or suspicious osseous lesion. Osteopenia. Soft tissues and spinal canal:  No prevertebral fluid or swelling. No visible canal hematoma. Disc levels: Multilevel degenerative changes, without significant spinal canal stenosis. Multilevel facet and uncovertebral hypertrophy, which causes severe bilateral neural foraminal narrowing at C5-C6 and C6-C7. Upper chest: Negative. Other: None. IMPRESSION: 1.  No acute intracranial process. 2.  No acute fracture or traumatic listhesis in the cervical spine. Electronically Signed   By: Merilyn Baba M.D.   On: 12/05/2021 02:37   DG HIP UNILAT W OR W/O PELVIS 2-3 VIEWS LEFT  Result Date: 12/05/2021 CLINICAL DATA:  Fall, left hip pain EXAM: DG HIP (WITH OR WITHOUT PELVIS) 2-3V LEFT COMPARISON:  None. FINDINGS: The osseous structures are are osteopenic. No acute fracture or dislocation. Hip joint spaces appear preserved. Vascular calcifications are seen within the pelvis and medial left thigh. Brachytherapy seeds are seen within the expected prostate gland. IMPRESSION: No acute fracture or dislocation. Osteopenia. Electronically Signed   By: Fidela Salisbury M.D.   On: 12/05/2021 03:07     Assessment and Plan:   AF RVR CHADVASC 4 Hypothyroidism Mild to mod MR Mod TR Rhabdo - in AF RVR, questions of whether this is driven or responsive to hypotension - likely needs additional IV fluid - could not tolerate AV nodal agents - will start IV amiodarone, would give IVD - if unable to tolerate this, would need TEE/DCCV (has missed doses of AC while he was down) - do not plan for long term amiodarone given the above   Risk Assessment/Risk Scores:   CHADSVASC 4  For questions or updates, please contact [place] Please consult [email or web address] for contact info under     Signed, Werner Lean, MD  12/05/2021 2:32 PM

## 2021-12-05 NOTE — ED Notes (Signed)
Patient's upper dentures placed in denture cup and labeled with patient sticker. Lower dentures brought home by son earlier this afternoon.

## 2021-12-05 NOTE — Evaluation (Signed)
Physical Therapy Evaluation Patient Details Name: [name] MRN: [id number] DOB: 05-30-1930 Today's Date: 12/05/2021  History of Present Illness  87 y.o. male presents to [facility] hospital on 12/05/2021 after experiencing a fall at home on 12/14. Pt was down for multiple before his son was able to find him. PT admitted for rhabdomyolysis, afib with RVR. PMH includes atrial fibrillation on Eliquis, CKD stage II, hypothyroidism, mitral and tricuspid cuspid regurgitation.  Clinical Impression  Pt presents to PT with deficits in functional mobility, gait, balance, endurance, strength, power, and with pain. Pt is limited by reports of back and BLE pain. Pt currently requires significant physical assistance to perform bed mobility and declines attempts at transfers due to discomfort. Pt will benefit from continued acute PT services to improve mobility quality and reduce falls risk.       Recommendations for follow up therapy are one component of a multi-disciplinary discharge planning process, led by the attending physician.  Recommendations may be updated based on patient status, additional functional criteria and insurance authorization.  Follow Up Recommendations Skilled nursing-short term rehab (<3 hours/day)    Assistance Recommended at Discharge Intermittent Supervision/Assistance  Functional Status Assessment Patient has had a recent decline in their functional status and demonstrates the ability to make significant improvements in function in a reasonable and predictable amount of time.  Equipment Recommendations  Wheelchair (measurements PT);Wheelchair cushion (measurements PT);Hospital bed    Recommendations for Other Services       Precautions / Restrictions Precautions Precautions: Fall Precaution Comments: afib RVR Restrictions Weight Bearing Restrictions: No      Mobility  Bed Mobility Overal bed mobility: Needs Assistance Bed Mobility: Supine to Sit;Sit to Supine      Supine to sit: Max assist Sit to supine: Total assist        Transfers Overall transfer level:  (pt declines transfer attempts due to LE and back discomfort)                      Ambulation/Gait                  Stairs            Wheelchair Mobility    Modified Rankin (Stroke Patients Only)       Balance Overall balance assessment: Needs assistance Sitting-balance support: Bilateral upper extremity supported;Feet supported Sitting balance-Leahy Scale: Poor Sitting balance - Comments: minG-minA Postural control: Posterior lean                                   Pertinent Vitals/Pain Pain Assessment: Faces Faces Pain Scale: Hurts even more Pain Location: BLE Pain Descriptors / Indicators: Sore Pain Intervention(s): Monitored during session    Home Living Family/patient expects to be discharged to:: Private residence Living Arrangements: Alone Available Help at Discharge: Family;Available PRN/intermittently Type of Home: House Home Access: Stairs to enter Entrance Stairs-Rails: None Entrance Stairs-Number of Steps: 1   Home Layout: One level Home Equipment: Rollator (4 wheels);Cane - single point;BSC/3in1;Grab bars - tub/shower      Prior Function Prior Level of Function : Independent/Modified Independent             Mobility Comments: ambulates with cane vs Rollator       Hand Dominance        Extremity/Trunk Assessment   Upper Extremity Assessment Upper Extremity Assessment: Generalized weakness    Lower Extremity Assessment  Lower Extremity Assessment: Generalized weakness (pt reports numbness in both feet at baseline, unsure if he has peripheral neuropathy)    Cervical / Trunk Assessment Cervical / Trunk Assessment: Kyphotic  Communication   Communication: No difficulties  Cognition Arousal/Alertness: Awake/alert Behavior During Therapy: WFL for tasks assessed/performed Overall Cognitive Status:  Within Functional Limits for tasks assessed                                          General Comments General comments (skin integrity, edema, etc.): pt in afib with RVR, HR at rest in high 120s to low 130s. Max HR observed at 159 briefly with mobility, recovers to resting of low 130s quickly    Exercises     Assessment/Plan    PT Assessment Patient needs continued PT services  PT Problem List Decreased strength;Decreased activity tolerance;Decreased balance;Decreased mobility;Cardiopulmonary status limiting activity;Pain       PT Treatment Interventions DME instruction;Gait training;Functional mobility training;Therapeutic activities;Therapeutic exercise;Balance training;Neuromuscular re-education;Patient/family education    PT Goals (Current goals can be found in the Care Plan section)  Acute Rehab PT Goals Patient Stated Goal: to reduce pain and improve mobility quality PT Goal Formulation: With patient Time For Goal Achievement: 12/19/21 Potential to Achieve Goals: Fair    Frequency Min 2X/week   Barriers to discharge        Co-evaluation               AM-PAC PT "6 Clicks" Mobility  Outcome Measure Help needed turning from your back to your side while in a flat bed without using bedrails?: A Lot Help needed moving from lying on your back to sitting on the side of a flat bed without using bedrails?: A Lot Help needed moving to and from a bed to a chair (including a wheelchair)?: Total Help needed standing up from a chair using your arms (e.g., wheelchair or bedside chair)?: Total Help needed to walk in hospital room?: Total Help needed climbing 3-5 steps with a railing? : Total 6 Click Score: 8    End of Session Equipment Utilized During Treatment: Oxygen Activity Tolerance: Patient limited by pain Patient left: in bed;with call bell/phone within reach Nurse Communication: Mobility status PT Visit Diagnosis: Other abnormalities of gait and  mobility (R26.89);Muscle weakness (generalized) (M62.81);Pain Pain - part of body:  (back and BLE)    Time: 1683-7290 PT Time Calculation (min) (ACUTE ONLY): 15 min   Charges:   PT Evaluation $PT Eval Low Complexity: 1 Low          Zenaida Niece, PT, DPT Acute Rehabilitation Pager: [phone] Office [phone]   Zenaida Niece 12/05/2021, 4:43 PM

## 2021-12-05 NOTE — ED Notes (Signed)
Patient transported to CT with trauma RN

## 2021-12-05 NOTE — ED Notes (Signed)
Patient transported to X-ray 

## 2021-12-05 NOTE — Progress Notes (Signed)
Orthopedic Tech Progress Note Patient Details:  [name] 1930/05/27 [id number] Level 2 trauma Patient ID: [name], male   DOB: 06/30/1930, 87 y.o.   MRN: [id number]  [name] 12/05/2021, 2:48 AM

## 2021-12-05 NOTE — ED Provider Notes (Signed)
[name]   CSN: [id number] Arrival date & time: 12/05/21  0119     History Chief Complaint  Patient presents with   [name]    [name] is a 87 y.o. male.  The history is provided by the patient and medical records.   87 year old male with history of A. fib on Eliquis, prior CVA, anemia, prostate cancer, hypothyroidism, presenting to the ED after a fall.  He fell sometime on Wednesday on hardwood floor, denies loss of consciousness.  States he simply could not move to get to the phone.  Son came over to check on him today and found him lying on the floor.  He does have bruising to left elbow and hip and wound developing on the low back.  He remains awake and alert, his only complaint is being incredibly cold and some mild pain of his left hip and arm.  He was given 1L IVF with EMS PTA along with cardizem for tachycardia, HR currently around 150.  Past Medical History:  Diagnosis Date   Alcohol abuse    Allergic dermatitis    Atrial fibrillation (HCC)    Celiac disease    Constipation    CVA (cerebral infarction) 1987   blood clot, right side and speech issues, recovered quickly   DJD (degenerative joint disease)    Macrocytic anemia    Nonspecific (abnormal) findings on radiological and other examination of other intrathoracic organs    Nonspecific abnormal results of thyroid function study    Olecranon bursitis    Prostate cancer ([place])    Pure hypercholesterolemia    Unspecified hemorrhoids without mention of complication     Patient Active Problem List   Diagnosis Date Noted   Rhabdomyolysis 12/05/2021   Acute renal failure superimposed on stage 2 chronic kidney disease ([place]) 03/54/6568   Metabolic acidosis 12/75/1700   Hypothermia 12/05/2021   Urinary tract infection without hematuria 12/03/2020   History of prostate cancer 11/25/2020   Chronic anticoagulation 07/06/2018   Vertebral fracture, osteoporotic,  with delayed healing, subsequent encounter 06/27/2018   Localized edema 03/24/2017   Mitral regurgitation 01/20/2015   Hypothyroidism 07/20/2013   Atrial fibrillation with RVR ([place]) 07/20/2013   Macrocytic anemia 02/06/2010   Celiac disease 12/22/2008   Hyperlipidemia with target LDL less than 130 11/26/2008    Past Surgical History:  Procedure Laterality Date   COLONOSCOPY     KYPHOPLASTY N/A 07/13/2018   Procedure: KYPHOPLASTY L2, L3, L5;  Surgeon: Melina Schools, MD;  Location: [place];  Service: Orthopedics;  Laterality: N/A;  120 mins   prostate biopsy and see implantation     WRIST FRACTURE SURGERY         Family History  Problem Relation Age of Onset   Heart disease Other        No family history    Social History   Tobacco Use   Smoking status: Never   Smokeless tobacco: Never  Vaping Use   Vaping Use: Never used  Substance Use Topics   Alcohol use: Yes    Comment: occasional (heavy drinker in the past)   Drug use: Never    Home Medications Prior to Admission medications   Medication Sig Start Date End Date Taking? Authorizing Provider  apixaban (ELIQUIS) 5 MG TABS tablet Take 1 tablet (5 mg total) by mouth 2 (two) times daily. 03/26/21   Lelon Perla, MD  atorvastatin (LIPITOR) 20 MG tablet TAKE 1 TABLET BY  MOUTH EVERY DAY 09/30/21   [name], MD  cyanocobalamin 1000 MCG tablet Take 1,000 mcg by mouth daily.     [provider]  dapsone 100 MG tablet Take 0.5 tablets (50 mg total) by mouth daily. 07/01/21   [name], MD  furosemide (LASIX) 20 MG tablet TAKE 1 TABLET BY MOUTH EVERY DAY 12/24/20   Lelon Perla, MD  levothyroxine (SYNTHROID) 50 MCG tablet TAKE 1 TABLET BY MOUTH EVERY DAY 09/07/21   [name], MD  metoprolol succinate (TOPROL-XL) 25 MG 24 hr tablet TAKE 1 TABLET BY MOUTH EVERY DAY 06/18/21   Lelon Perla, MD    Allergies    Patient has no known allergies.  Review of Systems   Review of Systems   Musculoskeletal:  Positive for arthralgias.  All other systems reviewed and are negative.  Physical Exam Updated Vital Signs BP 103/74    Pulse (!) 143    Temp 98.7 F (37.1 C) (Axillary)    Resp 16    Ht 6' (1.829 m)    Wt 66.7 kg    SpO2 94%    BMI 19.94 kg/m   Physical Exam Vitals and nursing [name] reviewed.  Constitutional:      Appearance: He is well-developed.     Comments: Elderly, cool to touch, dry appearing  HENT:     Head: Normocephalic and atraumatic.  Eyes:     Conjunctiva/sclera: Conjunctivae normal.     Pupils: Pupils are equal, round, and reactive to light.  Cardiovascular:     Rate and Rhythm: Regular rhythm. Tachycardia present.     Heart sounds: Normal heart sounds.     Comments: Tachy around 150 (shivering during exam) Pulmonary:     Effort: Pulmonary effort is normal. No respiratory distress.     Breath sounds: Normal breath sounds. No rhonchi.  Abdominal:     General: Bowel sounds are normal.     Palpations: Abdomen is soft.  Musculoskeletal:        General: Normal range of motion.     Cervical back: Normal range of motion.     Comments: Large skin tear and beginning of ulcerated wound noted to mid-lumbar spine, there is some mild tenderness, no bleeding Bruising noted to left elbow and left lateral hip, no leg shortening, extremities NVI  Skin:    General: Skin is warm and dry.  Neurological:     Mental Status: He is alert and oriented to person, place, and time.     Comments: Awake, alert, answering questions appropriately, moving extremities on command    ED Results / Procedures / Treatments   Labs (all labs ordered are listed, but only abnormal results are displayed) Labs Reviewed  CBC WITH DIFFERENTIAL/PLATELET - Abnormal; Notable for the following components:      Result Value   WBC 14.3 (*)    RBC 3.27 (*)    Hemoglobin 11.3 (*)    HCT 35.2 (*)    MCV 107.6 (*)    MCH 34.6 (*)    Neutro Abs 12.4 (*)    Abs Immature Granulocytes 0.13  (*)    All other components within normal limits  COMPREHENSIVE METABOLIC PANEL - Abnormal; Notable for the following components:   CO2 16 (*)    Glucose, Bld 110 (*)    BUN 68 (*)    Creatinine, Ser 1.43 (*)    Calcium 8.6 (*)    Albumin 3.3 (*)    AST 103 (*)  ALT 58 (*)    Total Bilirubin 2.0 (*)    GFR, Estimated 47 (*)    Anion gap 17 (*)    All other components within normal limits  LACTIC ACID, PLASMA - Abnormal; Notable for the following components:   Lactic Acid, Venous 2.7 (*)    All other components within normal limits  CK - Abnormal; Notable for the following components:   Total CK 1,889 (*)    All other components within normal limits  URINE CULTURE  CULTURE, BLOOD (ROUTINE X 2)  CULTURE, BLOOD (ROUTINE X 2)  RESP PANEL BY RT-PCR (FLU A&B, COVID) ARPGX2  URINALYSIS, ROUTINE W REFLEX MICROSCOPIC  LACTIC ACID, PLASMA    EKG EKG Interpretation  Date/Time:  Saturday December 05 2021 01:46:21 EST Ventricular Rate:  145 PR Interval:    QRS Duration: 83 QT Interval:  310 QTC Calculation: 482 R Axis:   270 Text Interpretation: Atrial fibrillation with rapid V-rate Inferior infarct, old Anterior infarct, old Lateral leads are also involved Confirmed by Gerlene Fee [phone]) on 12/05/2021 2:25:33 AM  Radiology DG Chest 1 View  Result Date: 12/05/2021 CLINICAL DATA:  Fall, chest pain EXAM: CHEST  1 VIEW COMPARISON:  07/20/2013 FINDINGS: Lungs are clear. No pneumothorax or pleural effusion. Cardiac size is within normal limits when accounting for supine positioning. Pulmonary vascularity is normal. Multiple thoracic compression deformities are noted. IMPRESSION: Multiple thoracic compression deformities, age indeterminate. No radiographic evidence of acute cardiopulmonary disease. Electronically Signed   By: Fidela Salisbury M.D.   On: 12/05/2021 03:11   DG Lumbar Spine Complete  Result Date: 12/05/2021 CLINICAL DATA:  Fall, left hip bruising, low back injury EXAM:  LUMBAR SPINE - COMPLETE 4+ VIEW COMPARISON:  MRI 06/27/2018 FINDINGS: The osseous structures are diffusely markedly osteopenic. Interval kyphoplasty of L2, L3, and L5. There is interval development of moderate to severe anterior wedge compression fractures of T11, T12, and L1, likely remote in nature given the lack of associated paravertebral soft tissue swelling. No listhesis. The paraspinal soft tissues are unremarkable save for vascular calcifications within the abdominal aorta. IMPRESSION: Multiple insufficiency fractures of the thoracolumbar spine, as described above, likely remote in nature. No definite acute fracture or listhesis. Severe osteopenia. Electronically Signed   By: Fidela Salisbury M.D.   On: 12/05/2021 03:05   DG Elbow Complete Left  Result Date: 12/05/2021 CLINICAL DATA:  Fall, left elbow pain EXAM: LEFT ELBOW - COMPLETE 3+ VIEW COMPARISON:  None. FINDINGS: Normal alignment. No acute fracture or dislocation. Mild chondrocalcinosis noted, likely degenerative in nature. Obliquity on lateral examination precludes evaluation for an effusion. Mild soft tissue swelling posterior to the olecranon. IMPRESSION: Soft tissue swelling.  No acute fracture or dislocation. Electronically Signed   By: Fidela Salisbury M.D.   On: 12/05/2021 03:08   CT HEAD WO CONTRAST (5MM)  Result Date: 12/05/2021 CLINICAL DATA:  Fall EXAM: CT HEAD WITHOUT CONTRAST CT CERVICAL SPINE WITHOUT CONTRAST TECHNIQUE: Multidetector CT imaging of the head and cervical spine was performed following the standard protocol without intravenous contrast. Multiplanar CT image reconstructions of the cervical spine were also generated. COMPARISON:  CT head 06/26/2009, no prior CT of the cervical spine. FINDINGS: CT HEAD FINDINGS Brain: No evidence of acute infarction, hemorrhage, cerebral edema, mass, mass effect, or midline shift. No hydrocephalus or extra-axial fluid collection. Periventricular white matter changes, likely the sequela of  chronic small vessel ischemic disease. Ventricles and sulci are commensurate in size. Vascular: No hyperdense vessel. Skull: Normal. Negative for  fracture or focal lesion. Sinuses/Orbits: No acute finding. Status post bilateral lens replacements. Other: The mastoid air cells are well aerated. CT CERVICAL SPINE FINDINGS Alignment: No listhesis. Skull base and vertebrae: No acute fracture or suspicious osseous lesion. Osteopenia. Soft tissues and spinal canal: No prevertebral fluid or swelling. No visible canal hematoma. Disc levels: Multilevel degenerative changes, without significant spinal canal stenosis. Multilevel facet and uncovertebral hypertrophy, which causes severe bilateral neural foraminal narrowing at C5-C6 and C6-C7. Upper chest: Negative. Other: None. IMPRESSION: 1.  No acute intracranial process. 2.  No acute fracture or traumatic listhesis in the cervical spine. Electronically Signed   By: Merilyn Baba M.D.   On: 12/05/2021 02:37   CT Cervical Spine Wo Contrast  Result Date: 12/05/2021 CLINICAL DATA:  Fall EXAM: CT HEAD WITHOUT CONTRAST CT CERVICAL SPINE WITHOUT CONTRAST TECHNIQUE: Multidetector CT imaging of the head and cervical spine was performed following the standard protocol without intravenous contrast. Multiplanar CT image reconstructions of the cervical spine were also generated. COMPARISON:  CT head 06/26/2009, no prior CT of the cervical spine. FINDINGS: CT HEAD FINDINGS Brain: No evidence of acute infarction, hemorrhage, cerebral edema, mass, mass effect, or midline shift. No hydrocephalus or extra-axial fluid collection. Periventricular white matter changes, likely the sequela of chronic small vessel ischemic disease. Ventricles and sulci are commensurate in size. Vascular: No hyperdense vessel. Skull: Normal. Negative for fracture or focal lesion. Sinuses/Orbits: No acute finding. Status post bilateral lens replacements. Other: The mastoid air cells are well aerated. CT CERVICAL  SPINE FINDINGS Alignment: No listhesis. Skull base and vertebrae: No acute fracture or suspicious osseous lesion. Osteopenia. Soft tissues and spinal canal: No prevertebral fluid or swelling. No visible canal hematoma. Disc levels: Multilevel degenerative changes, without significant spinal canal stenosis. Multilevel facet and uncovertebral hypertrophy, which causes severe bilateral neural foraminal narrowing at C5-C6 and C6-C7. Upper chest: Negative. Other: None. IMPRESSION: 1.  No acute intracranial process. 2.  No acute fracture or traumatic listhesis in the cervical spine. Electronically Signed   By: Merilyn Baba M.D.   On: 12/05/2021 02:37   DG HIP UNILAT W OR W/O PELVIS 2-3 VIEWS LEFT  Result Date: 12/05/2021 CLINICAL DATA:  Fall, left hip pain EXAM: DG HIP (WITH OR WITHOUT PELVIS) 2-3V LEFT COMPARISON:  None. FINDINGS: The osseous structures are are osteopenic. No acute fracture or dislocation. Hip joint spaces appear preserved. Vascular calcifications are seen within the pelvis and medial left thigh. Brachytherapy seeds are seen within the expected prostate gland. IMPRESSION: No acute fracture or dislocation. Osteopenia. Electronically Signed   By: Fidela Salisbury M.D.   On: 12/05/2021 03:07    Procedures Procedures   CRITICAL CARE Performed by: Larene Pickett   Total critical care time: 45 minutes  Critical care time was exclusive of separately billable procedures and treating other patients.  Critical care was necessary to treat or prevent imminent or life-threatening deterioration.  Critical care was time spent personally by me on the following activities: development of treatment plan with patient and/or surrogate as well as nursing, discussions with consultants, evaluation of patient's response to treatment, examination of patient, obtaining history from patient or surrogate, ordering and performing treatments and interventions, ordering and review of laboratory studies, ordering and  review of radiographic studies, pulse oximetry and re-evaluation of patient's condition.   Medications Ordered in ED Medications  sodium bicarbonate 150 mEq in sterile water 1,150 mL infusion (has no administration in time range)  sodium chloride 0.9 % bolus 1,000 mL (  0 mLs Intravenous Stopped 12/05/21 0331)  sodium chloride 0.9 % bolus 500 mL (0 mLs Intravenous Stopped 12/05/21 0359)    ED Course  I have reviewed the triage vital signs and the nursing notes.  Pertinent labs & imaging results that were available during my care of the patient were reviewed by me and considered in my medical decision making (see chart for details).    MDM Rules/Calculators/A&P                         87 year old male presenting to the ED as a level 2 fall on thinners.  He initially fell on Wednesday, has unfortunately been on the floor for 2 days.  He denies loss of consciousness but states he simply could not get to the phone.  Son went by to check on him today and found him in the floor.  Patient is awake, alert, oriented on arrival to ED.  Rectal temp 92.7F, placed on Bair hugger.  Has bruising noted to left elbow, left lateral hip, and lumbar spine with skin tear and beginning signs of pressure injury.  Barrier dressing was applied.  Imaging ordered including CT head and neck and films of left elbow, left hip, chest, and lumbar spine.  Labs sent including lactate, cultures, CK.  Continue IV fluids.  He is tachycardic but improved from time with EMS.  Will monitor closely.  Labs as above--lactate 2.7, does have elevated BUN and creatinine consistent with dehydration.  CK also elevated consistent with mild rhabdomyolysis.  Tachycardia is improving with continued IV fluids.  He remains awake, alert, oriented.  Imaging is negative for any acute bony findings.  He will require admission for ongoing hydration, likely will need wound care of his back.  At this point, do not see any evidence of acute infection so we  will hold off on antibiotics.  I suspect his lactic acidosis is from dehydration.  Cultures have been sent.  Discussed with hospitalist, Dr. Myna Hidalgo-- will admit for ongoing care.  Final Clinical Impression(s) / ED Diagnoses Final diagnoses:  Left hip pain  Traumatic rhabdomyolysis, initial encounter ([place])  Pressure injury of skin of back, unspecified injury stage  Dehydration    Rx / DC Orders ED Discharge Orders     None        Larene Pickett, PA-C 12/05/21 0443    Luna Fuse, MD 12/06/21 [phone]

## 2021-12-05 NOTE — ED Notes (Signed)
Unable to obtain EKG due to artifact

## 2021-12-05 NOTE — Progress Notes (Signed)
This patient is a 87 yr old man. He was found on the floor by his son who came to see him last night. The patient states that he lost his footing and once he fell, he was too weak to get up or get to a phone to call for help.  Once in the ED he was found to be in AF with RVR, heart rate 145. CT head and c-spine were negative for acute findings. CXR likewise was negative. All x-rays were negative for acute bony abnormalities. There was swellilng of the soft tissues of the left elbow and multiple remote thoracolumbar insufficiency fractures.   The patient has a past medical history of atrial fibrillation on Eliquis, CKD II, hypothyroidism, mitral and tricuspid regurgitation.  Laboratory demonstrates CK of 1889 with elevation of creatinine of 1.43. He has a metabolic acidosis with a CO2 of 16 and an anion gap of 17. He also has a UTI.   [facility] were consulted to admit the patient for further evaluation and treatment.  The patient was admitted to inpatient status this morning by my colleague, Dr. Myna Hidalgo.   He is currently being roomed in the ED while waiting for an available bed upstairs. His son is at bedside.  The patient is somnolent, but rouseable. His speech becomes intelligible when his loose lower dentures were removed. He is awake, alert, and oriented x 3. No acute distress.   Heart sounds: HR is fast. Greater than 130 bpm. No murmur, ectopy, or gallups. No lateral pmi. No thrills. Lung sounds: No increased work of breathing. No wheezes, rales, or rhonchi. No tactile fremitus. Abdomen: Soft, non-tender, non-distended. No organomegaly. Normoactive bowel sounds Extremities are cachectic He is awake, alert, and oriented x 3. No acute distress.   This morning the patient's EKG demonstrated Atrial Flutter with 2;1 block. Metoprolol has been held due to the patient's blood pressure with systolic in the 546'T.  Cardiology has been consulted.

## 2021-12-05 NOTE — ED Notes (Signed)
Trauma Response Nurse Note-  Reason for Call / Reason for Trauma activation:   - Level two fall on thinners, HR 150s  Plan of Care as of this note:  - Medical admission  Event Summary:   - Patient arrives via EMS from home, found down on the floor by son. [place] two days ago and unable to get up. Activated after arrival d/t eliquis and HR. GCS 15. Pain to left elbow, bruising and swelling noted. Sacral pressure wound. TRN facilitated transport to CT and calling XRAY.   The Following (if applicable):    -MD notified: Sedonia Small 58    -Patient arrival: 0119    -Time of Page/Time of notification: 34    -TRN arrival time: [place], [place]

## 2021-12-05 NOTE — ED Triage Notes (Signed)
Pt BIB EMS from home, fell on Wednesday and has been on floor since. On eliquis. Fell onto left elbow, pain swelling, and bruising noted. HR up to 160's upon arrival 64m of cardizem given. 700cc of NS given., A&Ox4 on triage. Cool to touch. Pressure ulcer to lower back forming.   Vitals: CBG: 126 BP: 108/64 O2: 98%

## 2021-12-06 DIAGNOSIS — D539 Nutritional anemia, unspecified: Secondary | ICD-10-CM

## 2021-12-06 LAB — CBC
HCT: 30 % — ABNORMAL LOW (ref 39.0–52.0)
Hemoglobin: 9.6 g/dL — ABNORMAL LOW (ref 13.0–17.0)
MCH: 33.6 pg (ref 26.0–34.0)
MCHC: 32 g/dL (ref 30.0–36.0)
MCV: 104.9 fL — ABNORMAL HIGH (ref 80.0–100.0)
Platelets: 140 10*3/uL — ABNORMAL LOW (ref 150–400)
RBC: 2.86 MIL/uL — ABNORMAL LOW (ref 4.22–5.81)
RDW: 14.4 % (ref 11.5–15.5)
WBC: 9.2 10*3/uL (ref 4.0–10.5)
nRBC: 0.3 % — ABNORMAL HIGH (ref 0.0–0.2)

## 2021-12-06 LAB — COMPREHENSIVE METABOLIC PANEL
ALT: 43 U/L (ref 0–44)
AST: 71 U/L — ABNORMAL HIGH (ref 15–41)
Albumin: 2.5 g/dL — ABNORMAL LOW (ref 3.5–5.0)
Alkaline Phosphatase: 54 U/L (ref 38–126)
Anion gap: 7 (ref 5–15)
BUN: 48 mg/dL — ABNORMAL HIGH (ref 8–23)
CO2: 26 mmol/L (ref 22–32)
Calcium: 8.2 mg/dL — ABNORMAL LOW (ref 8.9–10.3)
Chloride: 105 mmol/L (ref 98–111)
Creatinine, Ser: 0.93 mg/dL (ref 0.61–1.24)
GFR, Estimated: >60 mL/min (ref >60)
Glucose, Bld: 150 mg/dL — ABNORMAL HIGH (ref 70–99)
Potassium: 3.9 mmol/L (ref 3.5–5.1)
Sodium: 138 mmol/L (ref 135–145)
Total Bilirubin: 1.1 mg/dL (ref 0.3–1.2)
Total Protein: 5.5 g/dL — ABNORMAL LOW (ref 6.5–8.1)

## 2021-12-06 LAB — CK: Total CK: 676 U/L — ABNORMAL HIGH (ref 49–397)

## 2021-12-06 MED ORDER — AMIODARONE HCL IN DEXTROSE 360-4.14 MG/200ML-% IV SOLN
30.0000 mg/h | INTRAVENOUS | Status: DC
  Administered 2021-12-07 – 2021-12-08 (×4): 30 mg/h via INTRAVENOUS
  Filled 2021-12-06 (×5): qty 200

## 2021-12-06 MED ORDER — AMIODARONE HCL IN DEXTROSE 360-4.14 MG/200ML-% IV SOLN
60.0000 mg/h | INTRAVENOUS | Status: AC
  Administered 2021-12-06 – 2021-12-07 (×5): 60 mg/h via INTRAVENOUS
  Filled 2021-12-06 (×4): qty 200

## 2021-12-06 MED ORDER — DAPSONE 25 MG PO TABS
50.0000 mg | ORAL_TABLET | Freq: Every day | ORAL | Status: DC
  Administered 2021-12-06 – 2021-12-15 (×10): 50 mg via ORAL
  Filled 2021-12-06 (×10): qty 2

## 2021-12-06 MED ORDER — AMIODARONE LOAD VIA INFUSION
150.0000 mg | Freq: Once | INTRAVENOUS | Status: AC
  Administered 2021-12-06: 13:00:00 150 mg via INTRAVENOUS
  Filled 2021-12-06: qty 83.34

## 2021-12-06 NOTE — Progress Notes (Signed)
PROGRESS NOTE  DATRON BRAKEBILL EVO:[id number] DOB: 1930/09/23 DOA: 12/05/2021 PCP: [name], MD  Brief History   The patient is a 87 yr old man who was found on the floor by his son. The patient had been on the floor after a fall on 12/02/2021. He was too weak to get up or get to a phone.  In the ED he was found to have AF with RVR and a UTI. He has a past medical history of AF, Eliquis, CKD II, hypothyroidism, mitral and tricuspid regurgitation. CK was found to be 1889 with an elevation of creatinine. He was given IV fluids. CK is down to 600 this morning.  Cardiology was consulted for the patient's Atrial fib/rlutter with RVR. Beta blockers or CCB could not given due to the patient's hypotension. He has been started on amiodarone at least temporarily in order to control rate. It has been minimally effective. HR continues to be 130-150. The patient has remained hypotensive despite IV fluid resuscitation. He is on eliquis for stroke prophylaxis.  Consultants  Cardiology  Procedures  None  Antibiotics   Anti-infectives (From admission, onward)    Start     Dose/Rate Route Frequency Ordered Stop   12/06/21 1500  dapsone tablet 50 mg        50 mg Oral Daily 12/06/21 1408     12/05/21 1045  cefTRIAXone (ROCEPHIN) 1 g in sodium chloride 0.9 % 100 mL IVPB        1 g 200 mL/hr over 30 Minutes Intravenous Every 24 hours 12/05/21 1038        Interval History/Subjective  This patient is resting comfortably. No new complaints.  Objective   Vitals:  Vitals:   12/06/21 0916 12/06/21 1042  BP: (!) 86/63 91/69  Pulse:  (!) 106  Resp:    Temp:    SpO2: 96%     Exam:  Constitutional:  The patient is awake, alert, and oriented x 3. No acute distress. Respiratory:  No increased work of breathing. No wheezes, rales, or rhonchi No tactile fremitus Cardiovascular:  Regular rate and rhythm, Rate >120. No murmurs, ectopy, or gallups. No lateral PMI. No thrills. Abdomen:   Abdomen is soft, non-tender, non-distended No hernias, masses, or organomegaly Normoactive bowel sounds.  Musculoskeletal:  No cyanosis, clubbing, or edema Skin:  No rashes, lesions, ulcers palpation of skin: no induration or nodules Neurologic:  CN 2-12 intact Sensation all 4 extremities intact Psychiatric:  Mental status Mood, affect appropriate Orientation to person, place, time  judgment and insight appear intact  I have personally reviewed the following:   Today's Data  Vitals  Lab Data  CBC CMP  Micro Data  Blood culture x 2 - no growth  Imaging  CT head CT C-spine CXR X-ray of elbow X-ray of hip  X-ray L-spine  Cardiology Data  EKG  Scheduled Meds:  apixaban  5 mg Oral BID   dapsone  50 mg Oral Daily   feeding supplement  237 mL Oral TID BM   levothyroxine  50 mcg Oral Daily   metoprolol succinate  25 mg Oral Daily   sodium chloride flush  3 mL Intravenous Q12H   Continuous Infusions:  sodium chloride 75 mL/hr at 12/06/21 1039   amiodarone 60 mg/hr (12/06/21 1424)   Followed by   Derrill Memo ON 12/07/2021] amiodarone     cefTRIAXone (ROCEPHIN)  IV 1 g (12/06/21 1041)    Principal Problem:   Rhabdomyolysis Active Problems:   Macrocytic  anemia   Hypothyroidism   Atrial fibrillation with RVR (HCC)   Mitral regurgitation   Acute renal failure superimposed on stage 2 chronic kidney disease (HCC)   Metabolic acidosis   Hypothermia   LOS: 1 day   A & P  1. Rhabdomyolysis; AKI superimposed on CKD II; metabolic acidosis  - Presents after falling early am of 12/14 and being too generally weak to get up and is found to have CK of 1889, BUN 68 and SCr 1.43 (22 & 0.87 previously), and bicarbonate 16   - Fluid resuscitated with NS in ED  - The patient was given IVF hydration with isotonic bicarbonate since admission.  - CK down to 600 this morning.   -lactic acidosis resolved.  2. Sepsis: The patient has tachycardia, hypotension, hypothermia, lactic  acidosis, leukocytosis, and AKI, on admission. Improved with IV fluids and antibiotics. However the patient continues to be hypotensive. Blood cultures x 2 have had no growth.   3. Atrial fibrillation with RVR   - Rate 160s with EMS and treated with 20 mg diltiazem prior to arrival  -Cardiology was consulted due to inability to control heart rate due to hypotension. The patient was started on a short course of amiodarone due to his ongoing hypotension.  - Pt started on amiodarone as BB and CCB are contraindicated due to hypotension.   4. UTI: UA is positive for UTI. Weakness resulting in fall and unable to get off of the floor is due to UTI. Urine culture is positive for E. Coli. The patient is receiving Rocephin for treatment.  5. Hypotension:   - Persists despite IV fluids and IV antibiotics. Antihypertensives are held. Likely multifactorial. Order echocardiogram.   6. Mitral and tricuspid regurgitation  - Severe TR and mild MR on TTE in May 2022  - Followed by cardiology, managed conservatively with diuretics - Hold Lasix for now while hydrating cautiously     7. Hypothermia  - Treated with active external warming briefly in ED, now resolved     8. Hypothyroidism  - Check TSH in light of general weakness and hypothermia, continue Synthroid. TSH is normal.  I have seen and examined this patient myself. I have spent 34 minutes in his evaluation and care.   DVT prophylaxis: Eliquis  Code Status: Full, discussed with patient  Level of Care: Level of care: Progressive Family Communication: None present  Disposition Plan:  Patient is from: home  Anticipated d/c is to: TBD Anticipated d/c date is: 12/08/21  Patient currently: Pending improvement in renal function, PT eval    Annalise Mcdiarmid, DO [facility] Direct contact: see [email or web address]  7PM-7AM contact night coverage as above 12/06/2021, 3:19 PM  LOS: 1 day

## 2021-12-06 NOTE — TOC Initial Note (Signed)
Transition of Care [facility]) - Initial/Assessment Note    Patient Details  Name: [name] MRN: [id number] Date of Birth: 08/04/1930  [facility] CM/SW Contact:    [name], [place] Phone Number: 12/06/2021, 1:55 PM  Clinical Narrative:                  CSW received consult for possible SNF placement at time of discharge. CSW spoke with patient regarding PT recommendation of SNF placement at time of discharge. Patient reports he comes from home alone.Patient expressed understanding of PT recommendation and is agreeable to SNF placement at time of discharge. Patient gave CSW permission to fax out initial referral near the [place] area. CSW discussed insurance authorization process with patient.Patient reports he has received the COVID vaccines as well as 2 boosters.  No further questions reported at this time. CSW to continue to follow and assist with discharge planning needs.        Patient Goals and CMS Choice        Expected Discharge Plan and Services                                                Prior Living Arrangements/Services                       Activities of Daily Living Home Assistive Devices/Equipment: Gilford Rile (specify type) ADL Screening (condition at time of admission) Patient's cognitive ability adequate to safely complete daily activities?: Yes Is the patient deaf or have difficulty hearing?: No Does the patient have difficulty seeing, even when wearing glasses/contacts?: No Does the patient have difficulty concentrating, remembering, or making decisions?: No Patient able to express need for assistance with ADLs?: Yes Does the patient have difficulty dressing or bathing?: No Independently performs ADLs?: Yes (appropriate for developmental age) Does the patient have difficulty walking or climbing stairs?: No Weakness of Legs: Both Weakness of Arms/Hands: Both  Permission Sought/Granted                   Emotional Assessment              Admission diagnosis:  Rhabdomyolysis [M62.82] Dehydration [E86.0] Fall [W19.XXXA] Left hip pain [M25.552] Traumatic rhabdomyolysis, initial encounter ([place]) [T79.6XXA] Pressure injury of skin of back, unspecified injury stage [L89.109] Patient Active Problem List   Diagnosis Date Noted   Rhabdomyolysis 12/05/2021   Acute renal failure superimposed on stage 2 chronic kidney disease ([place]) 66/44/0347   Metabolic acidosis 42/59/5638   Hypothermia 12/05/2021   Urinary tract infection without hematuria 12/03/2020   History of prostate cancer 11/25/2020   Chronic anticoagulation 07/06/2018   Vertebral fracture, osteoporotic, with delayed healing, subsequent encounter 06/27/2018   Localized edema 03/24/2017   Mitral regurgitation 01/20/2015   Hypothyroidism 07/20/2013   Atrial fibrillation with RVR ([place]) 07/20/2013   Macrocytic anemia 02/06/2010   Celiac disease 12/22/2008   Hyperlipidemia with target LDL less than 130 11/26/2008   PCP:  [name], MD Pharmacy:   [id number] [place] [id number]: [phone]: [phone]    Social Determinants of Health (SDOH) Interventions    Readmission Risk Interventions No flowsheet data found.

## 2021-12-06 NOTE — Progress Notes (Signed)
Progress Note  Patient Name: [name] Date of Encounter: 12/06/2021  [facility] Cardiologist: Kirk Ruths, MD   Subjective   Feeling better. Denies chest pain or palpitations.   Remains in Afib with RVR. HR 140s this AM.   Inpatient Medications    Scheduled Meds:  apixaban  5 mg Oral BID   feeding supplement  237 mL Oral TID BM   levothyroxine  50 mcg Oral Daily   metoprolol succinate  25 mg Oral Daily   sodium chloride flush  3 mL Intravenous Q12H   Continuous Infusions:  sodium chloride 75 mL/hr at 12/06/21 1039   amiodarone 30 mg/hr (12/06/21 0753)   cefTRIAXone (ROCEPHIN)  IV 1 g (12/06/21 1041)   PRN Meds: acetaminophen **OR** acetaminophen, metoprolol tartrate   Vital Signs    Vitals:   12/06/21 0800 12/06/21 0829 12/06/21 0916 12/06/21 1042  BP: 95/62 (!) 85/76 (!) 86/63 91/69  Pulse: (!) 119 (!) 107  (!) 106  Resp: 16 17    Temp:  97.7 F (36.5 C)    TempSrc:      SpO2: 96% 98% 96%   Weight:      Height:        Intake/Output Summary (Last 24 hours) at 12/06/2021 1230 Last data filed at 12/06/2021 0300 Gross per 24 hour  Intake 1201.68 ml  Output 600 ml  Net 601.68 ml   Last 3 Weights 12/06/2021 12/05/2021 07/07/2021  Weight (lbs) 155 lb 3.3 oz 147 lb 0.8 oz 147 lb  Weight (kg) 70.4 kg 66.7 kg 66.679 kg      Telemetry    Afib with RVR with HR 120-140s - Personally Reviewed  ECG    No new tracing - Personally Reviewed  Physical Exam   GEN: Elderly male, comfortable  Neck: No JVD Cardiac: Irregularly irregular, tachycardic, 2/6 systolic murmur Respiratory: Clear to auscultation bilaterally. GI: Soft, nontender, non-distended  MS: No edema; No deformity. Neuro:  Nonfocal  Psych: Normal affect   Labs    High Sensitivity Troponin:  No results for input(s): TROPONINIHS in the last 720 hours.   Chemistry Recent Labs  Lab 12/05/21 0138 12/06/21 0235  NA 137 138  K 4.5 3.9  CL 104 105  CO2 16* 26  GLUCOSE 110* 150*   BUN 68* 48*  CREATININE 1.43* 0.93  CALCIUM 8.6* 8.2*  PROT 6.8 5.5*  ALBUMIN 3.3* 2.5*  AST 103* 71*  ALT 58* 43  ALKPHOS 67 54  BILITOT 2.0* 1.1  GFRNONAA 47* >60  ANIONGAP 17* 7    Lipids No results for input(s): CHOL, TRIG, HDL, LABVLDL, LDLCALC, CHOLHDL in the last 168 hours.  Hematology Recent Labs  Lab 12/05/21 0138 12/06/21 0235  WBC 14.3* 9.2  RBC 3.27* 2.86*  HGB 11.3* 9.6*  HCT 35.2* 30.0*  MCV 107.6* 104.9*  MCH 34.6* 33.6  MCHC 32.1 32.0  RDW 14.0 14.4  PLT 194 140*   Thyroid  Recent Labs  Lab 12/05/21 1930  TSH 2.229    BNPNo results for input(s): BNP, PROBNP in the last 168 hours.  DDimer No results for input(s): DDIMER in the last 168 hours.   Radiology    DG Chest 1 View  Result Date: 12/05/2021 CLINICAL DATA:  Fall, chest pain EXAM: CHEST  1 VIEW COMPARISON:  07/20/2013 FINDINGS: Lungs are clear. No pneumothorax or pleural effusion. Cardiac size is within normal limits when accounting for supine positioning. Pulmonary vascularity is normal. Multiple thoracic compression deformities are noted.  IMPRESSION: Multiple thoracic compression deformities, age indeterminate. No radiographic evidence of acute cardiopulmonary disease. Electronically Signed   By: Fidela Salisbury M.D.   On: 12/05/2021 03:11   DG Lumbar Spine Complete  Result Date: 12/05/2021 CLINICAL DATA:  Fall, left hip bruising, low back injury EXAM: LUMBAR SPINE - COMPLETE 4+ VIEW COMPARISON:  MRI 06/27/2018 FINDINGS: The osseous structures are diffusely markedly osteopenic. Interval kyphoplasty of L2, L3, and L5. There is interval development of moderate to severe anterior wedge compression fractures of T11, T12, and L1, likely remote in nature given the lack of associated paravertebral soft tissue swelling. No listhesis. The paraspinal soft tissues are unremarkable save for vascular calcifications within the abdominal aorta. IMPRESSION: Multiple insufficiency fractures of the thoracolumbar  spine, as described above, likely remote in nature. No definite acute fracture or listhesis. Severe osteopenia. Electronically Signed   By: Fidela Salisbury M.D.   On: 12/05/2021 03:05   DG Elbow Complete Left  Result Date: 12/05/2021 CLINICAL DATA:  Fall, left elbow pain EXAM: LEFT ELBOW - COMPLETE 3+ VIEW COMPARISON:  None. FINDINGS: Normal alignment. No acute fracture or dislocation. Mild chondrocalcinosis noted, likely degenerative in nature. Obliquity on lateral examination precludes evaluation for an effusion. Mild soft tissue swelling posterior to the olecranon. IMPRESSION: Soft tissue swelling.  No acute fracture or dislocation. Electronically Signed   By: Fidela Salisbury M.D.   On: 12/05/2021 03:08   CT HEAD WO CONTRAST (5MM)  Result Date: 12/05/2021 CLINICAL DATA:  Fall EXAM: CT HEAD WITHOUT CONTRAST CT CERVICAL SPINE WITHOUT CONTRAST TECHNIQUE: Multidetector CT imaging of the head and cervical spine was performed following the standard protocol without intravenous contrast. Multiplanar CT image reconstructions of the cervical spine were also generated. COMPARISON:  CT head 06/26/2009, no prior CT of the cervical spine. FINDINGS: CT HEAD FINDINGS Brain: No evidence of acute infarction, hemorrhage, cerebral edema, mass, mass effect, or midline shift. No hydrocephalus or extra-axial fluid collection. Periventricular white matter changes, likely the sequela of chronic small vessel ischemic disease. Ventricles and sulci are commensurate in size. Vascular: No hyperdense vessel. Skull: Normal. Negative for fracture or focal lesion. Sinuses/Orbits: No acute finding. Status post bilateral lens replacements. Other: The mastoid air cells are well aerated. CT CERVICAL SPINE FINDINGS Alignment: No listhesis. Skull base and vertebrae: No acute fracture or suspicious osseous lesion. Osteopenia. Soft tissues and spinal canal: No prevertebral fluid or swelling. No visible canal hematoma. Disc levels: Multilevel  degenerative changes, without significant spinal canal stenosis. Multilevel facet and uncovertebral hypertrophy, which causes severe bilateral neural foraminal narrowing at C5-C6 and C6-C7. Upper chest: Negative. Other: None. IMPRESSION: 1.  No acute intracranial process. 2.  No acute fracture or traumatic listhesis in the cervical spine. Electronically Signed   By: Merilyn Baba M.D.   On: 12/05/2021 02:37   CT Cervical Spine Wo Contrast  Result Date: 12/05/2021 CLINICAL DATA:  Fall EXAM: CT HEAD WITHOUT CONTRAST CT CERVICAL SPINE WITHOUT CONTRAST TECHNIQUE: Multidetector CT imaging of the head and cervical spine was performed following the standard protocol without intravenous contrast. Multiplanar CT image reconstructions of the cervical spine were also generated. COMPARISON:  CT head 06/26/2009, no prior CT of the cervical spine. FINDINGS: CT HEAD FINDINGS Brain: No evidence of acute infarction, hemorrhage, cerebral edema, mass, mass effect, or midline shift. No hydrocephalus or extra-axial fluid collection. Periventricular white matter changes, likely the sequela of chronic small vessel ischemic disease. Ventricles and sulci are commensurate in size. Vascular: No hyperdense vessel. Skull: Normal. Negative for  fracture or focal lesion. Sinuses/Orbits: No acute finding. Status post bilateral lens replacements. Other: The mastoid air cells are well aerated. CT CERVICAL SPINE FINDINGS Alignment: No listhesis. Skull base and vertebrae: No acute fracture or suspicious osseous lesion. Osteopenia. Soft tissues and spinal canal: No prevertebral fluid or swelling. No visible canal hematoma. Disc levels: Multilevel degenerative changes, without significant spinal canal stenosis. Multilevel facet and uncovertebral hypertrophy, which causes severe bilateral neural foraminal narrowing at C5-C6 and C6-C7. Upper chest: Negative. Other: None. IMPRESSION: 1.  No acute intracranial process. 2.  No acute fracture or traumatic  listhesis in the cervical spine. Electronically Signed   By: Merilyn Baba M.D.   On: 12/05/2021 02:37   DG HIP UNILAT W OR W/O PELVIS 2-3 VIEWS LEFT  Result Date: 12/05/2021 CLINICAL DATA:  Fall, left hip pain EXAM: DG HIP (WITH OR WITHOUT PELVIS) 2-3V LEFT COMPARISON:  None. FINDINGS: The osseous structures are are osteopenic. No acute fracture or dislocation. Hip joint spaces appear preserved. Vascular calcifications are seen within the pelvis and medial left thigh. Brachytherapy seeds are seen within the expected prostate gland. IMPRESSION: No acute fracture or dislocation. Osteopenia. Electronically Signed   By: Fidela Salisbury M.D.   On: 12/05/2021 03:07    Cardiac Studies   TTE 04/2021: IMPRESSIONS   1. Left ventricular ejection fraction, by estimation, is 60 to 65%. The  left ventricle has normal function. The left ventricle has no regional  wall motion abnormalities. Left ventricular diastolic parameters are  indeterminate.   2. Right ventricular systolic function is normal. The right ventricular  size is mildly enlarged. There is mildly elevated pulmonary artery  systolic pressure.   3. Left atrial size was severely dilated.   4. Right atrial size was mildly dilated.   5. The mitral valve is normal in structure. Mild mitral valve  regurgitation. No evidence of mitral stenosis.   6. Tricuspid valve regurgitation is severe.   7. The aortic valve is tricuspid. There is moderate calcification of the  aortic valve. There is moderate thickening of the aortic valve. Aortic  valve regurgitation is not visualized. Mild to moderate aortic valve  sclerosis/calcification is present,  without any evidence of aortic stenosis.   8. There is mild dilatation of the aortic root, measuring 40 mm.   9. The inferior vena cava is dilated in size with >50% respiratory  variability, suggesting right atrial pressure of 8 mmHg.   Patient Profile     87 y.o. male with history of persistent Afib,  prior CVA, hypothyroidism, and mild TAA who presented after a mechanical fall where he was down for 2 days found to have rhabdomyolysis and Afib with RVR. Cardiology is consulted regarding Afib with RVR.  Assessment & Plan    #Afib with RVR: CHADS-vasc 4. Has known history of Afib on metop and apixaban at home. Now with RVR in the setting of rhabdo as above. Did not tolerate BB or dilt due to hypotension. Now on amiodarone with persistently elevated rates.  -Re-bolus amiodarone 129m over 1 hour due to soft blood pressure -Continue gtt at 60 for 24h before weaning to 30 -Metop not administered due to soft blood pressures -Continue apixaban for AC -Suspect HR will improve as patient clinically improves  #Rhabdo: #Mechanical Fall: Patient with mechanical fall after trying to turn around. Was down for 2 days with rhabdo on admission. S/p IVF with improvement -Management per primary  #Severe TR: #Mild MR: Managing conservatively given advanced age -No diuretic given  hypovolemia on admission      For questions or updates, please contact [place] Please consult [email or web address] for contact info under        Signed, Freada Bergeron, MD  12/06/2021, 12:30 PM

## 2021-12-06 NOTE — NC FL2 (Signed)
[place] MEDICAID FL2 LEVEL OF CARE SCREENING TOOL     IDENTIFICATION  Patient Name: [name] Birthdate: 1930/05/12 Sex: male Admission Date (Current Location): 12/05/2021  [facility] and [place] Number:  Herbalist and Address:  [facility], [place] [address], [place], [place] [address]      Provider Number: [id number]  Attending Physician Name and Address:  Karie Kirks, DO  Relative Name and Phone Number:       Current Level of Care: Hospital Recommended Level of Care: [place] Prior Approval Number:    Date Approved/Denied:   PASRR Number: [id number]  Discharge Plan: SNF    Current Diagnoses: Patient Active Problem List   Diagnosis Date Noted   Rhabdomyolysis 12/05/2021   Acute renal failure superimposed on stage 2 chronic kidney disease ([place]) 13/24/4010   Metabolic acidosis 27/25/3664   Hypothermia 12/05/2021   Urinary tract infection without hematuria 12/03/2020   History of prostate cancer 11/25/2020   Chronic anticoagulation 07/06/2018   Vertebral fracture, osteoporotic, with delayed healing, subsequent encounter 06/27/2018   Localized edema 03/24/2017   Mitral regurgitation 01/20/2015   Hypothyroidism 07/20/2013   Atrial fibrillation with RVR ([place]) 07/20/2013   Macrocytic anemia 02/06/2010   Celiac disease 12/22/2008   Hyperlipidemia with target LDL less than 130 11/26/2008    Orientation RESPIRATION BLADDER Height & Weight     Self, Time, Situation, Place (WDL)  Normal External catheter, Continent (External Urinary Catheter) Weight: 155 lb 3.3 oz (70.4 kg) Height:  6' (182.9 cm)  BEHAVIORAL SYMPTOMS/MOOD NEUROLOGICAL BOWEL NUTRITION STATUS      Continent Diet (Please see discharge summary)  AMBULATORY STATUS COMMUNICATION OF NEEDS Skin   Extensive Assist Verbally Other (Comment) (flaky,dry,abrasion,buttocks,back,PI sacrum unstageable,foam lift dressing to assess site every shift,clean,dry,intact)                        Personal Care Assistance Level of Assistance  Bathing, Feeding, Dressing Bathing Assistance: Limited assistance Feeding assistance: Limited assistance Dressing Assistance: Limited assistance     Functional Limitations Info  Sight, Hearing, Speech Sight Info: Adequate Hearing Info: Adequate Speech Info: Adequate    SPECIAL CARE FACTORS FREQUENCY  PT (By licensed PT), OT (By licensed OT)     PT Frequency: 5x min weekly OT Frequency: 5x min weekly            Contractures Contractures Info: Not present    Additional Factors Info  Code Status Code Status Info: FULL             Current Medications (12/06/2021):  This is the current hospital active medication list Current Facility-Administered Medications  Medication Dose Route Frequency Provider Last Rate Last Admin   0.9 %  sodium chloride infusion   Intravenous Continuous Swayze, Ava, DO 75 mL/hr at 12/06/21 1039 Rate Change at 12/06/21 1039   acetaminophen (TYLENOL) tablet 650 mg  650 mg Oral Q6H PRN Opyd, Ilene Qua, MD       Or   acetaminophen (TYLENOL) suppository 650 mg  650 mg Rectal Q6H PRN Opyd, Ilene Qua, MD       amiodarone (NEXTERONE) 1.8 mg/mL load via infusion 150 mg  150 mg Intravenous Once Freada Bergeron, MD   150 mg at 12/06/21 1310   Followed by   amiodarone (NEXTERONE PREMIX) 360-4.14 MG/200ML-% (1.8 mg/mL) IV infusion  60 mg/hr Intravenous Continuous Freada Bergeron, MD       Followed by   [  START ON 12/07/2021] amiodarone (NEXTERONE PREMIX) 360-4.14 MG/200ML-% (1.8 mg/mL) IV infusion  30 mg/hr Intravenous Continuous Freada Bergeron, MD       apixaban (ELIQUIS) tablet 5 mg  5 mg Oral BID Opyd, Ilene Qua, MD   5 mg at 12/06/21 0918   cefTRIAXone (ROCEPHIN) 1 g in sodium chloride 0.9 % 100 mL IVPB  1 g Intravenous Q24H Swayze, Ava, DO 200 mL/hr at 12/06/21 1041 1 g at 12/06/21 1041   feeding supplement (ENSURE ENLIVE / ENSURE PLUS) liquid 237 mL  237 mL Oral TID  BM Swayze, Ava, DO   237 mL at 12/06/21 1345   levothyroxine (SYNTHROID) tablet 50 mcg  50 mcg Oral Daily Opyd, Ilene Qua, MD   50 mcg at 12/06/21 0505   metoprolol succinate (TOPROL-XL) 24 hr tablet 25 mg  25 mg Oral Daily Swayze, Ava, DO       metoprolol tartrate (LOPRESSOR) injection 2.5 mg  2.5 mg Intravenous Q4H PRN Opyd, Ilene Qua, MD       sodium chloride flush (NS) 0.9 % injection 3 mL  3 mL Intravenous Q12H Opyd, Ilene Qua, MD   3 mL at 12/06/21 0820     Discharge Medications: Please see discharge summary for a list of discharge medications.  Relevant Imaging Results:  Relevant Lab Results:   Additional Information [phone], Both Covid Vaccines 2 boosters  [name], LCSWA

## 2021-12-06 NOTE — Plan of Care (Signed)

## 2021-12-07 ENCOUNTER — Inpatient Hospital Stay (HOSPITAL_COMMUNITY): Payer: Medicare Other

## 2021-12-07 DIAGNOSIS — I4891 Unspecified atrial fibrillation: Secondary | ICD-10-CM

## 2021-12-07 DIAGNOSIS — L8990 Pressure ulcer of unspecified site, unspecified stage: Secondary | ICD-10-CM | POA: Insufficient documentation

## 2021-12-07 LAB — ECHOCARDIOGRAM COMPLETE
AR max vel: 1.58 cm2
AV Peak grad: 6.9 mmHg
Ao pk vel: 1.31 m/s
Area-P 1/2: 5.09 cm2
Calc EF: 53.2 %
Height: 72 in
MV M vel: 4.31 m/s
MV Peak grad: 74.3 mmHg
S' Lateral: 3 cm
Single Plane A2C EF: 57.3 %
Single Plane A4C EF: 51 %
Weight: 2483.26 oz

## 2021-12-07 LAB — COMPREHENSIVE METABOLIC PANEL
ALT: 52 U/L — ABNORMAL HIGH (ref 0–44)
AST: 70 U/L — ABNORMAL HIGH (ref 15–41)
Albumin: 2.2 g/dL — ABNORMAL LOW (ref 3.5–5.0)
Alkaline Phosphatase: 54 U/L (ref 38–126)
Anion gap: 7 (ref 5–15)
BUN: 27 mg/dL — ABNORMAL HIGH (ref 8–23)
CO2: 23 mmol/L (ref 22–32)
Calcium: 7.8 mg/dL — ABNORMAL LOW (ref 8.9–10.3)
Chloride: 106 mmol/L (ref 98–111)
Creatinine, Ser: 0.7 mg/dL (ref 0.61–1.24)
GFR, Estimated: >60 mL/min (ref >60)
Glucose, Bld: 140 mg/dL — ABNORMAL HIGH (ref 70–99)
Potassium: 3.5 mmol/L (ref 3.5–5.1)
Sodium: 136 mmol/L (ref 135–145)
Total Bilirubin: 1 mg/dL (ref 0.3–1.2)
Total Protein: 5.3 g/dL — ABNORMAL LOW (ref 6.5–8.1)

## 2021-12-07 LAB — CK: Total CK: 312 U/L (ref 49–397)

## 2021-12-07 LAB — CBC
HCT: 28.7 % — ABNORMAL LOW (ref 39.0–52.0)
Hemoglobin: 9.5 g/dL — ABNORMAL LOW (ref 13.0–17.0)
MCH: 34.7 pg — ABNORMAL HIGH (ref 26.0–34.0)
MCHC: 33.1 g/dL (ref 30.0–36.0)
MCV: 104.7 fL — ABNORMAL HIGH (ref 80.0–100.0)
Platelets: 128 10*3/uL — ABNORMAL LOW (ref 150–400)
RBC: 2.74 MIL/uL — ABNORMAL LOW (ref 4.22–5.81)
RDW: 14.8 % (ref 11.5–15.5)
WBC: 8.2 10*3/uL (ref 4.0–10.5)
nRBC: 0.4 % — ABNORMAL HIGH (ref 0.0–0.2)

## 2021-12-07 LAB — URINE CULTURE: Culture: >=100000 — AB

## 2021-12-07 MED ORDER — COLLAGENASE 250 UNIT/GM EX OINT
TOPICAL_OINTMENT | Freq: Every day | CUTANEOUS | Status: DC
  Filled 2021-12-07 (×4): qty 30

## 2021-12-07 NOTE — Progress Notes (Signed)
Progress Note  Patient Name: [name] Date of Encounter: 12/07/2021  [facility] Cardiologist: Kirk Ruths, MD   Subjective   No CP or dyspnea  Inpatient Medications    Scheduled Meds:  apixaban  5 mg Oral BID   dapsone  50 mg Oral Daily   feeding supplement  237 mL Oral TID BM   levothyroxine  50 mcg Oral Daily   metoprolol succinate  25 mg Oral Daily   sodium chloride flush  3 mL Intravenous Q12H   Continuous Infusions:  sodium chloride 75 mL/hr at 12/07/21 0518   amiodarone 60 mg/hr (12/07/21 0340)   Followed by   amiodarone     cefTRIAXone (ROCEPHIN)  IV 1 g (12/06/21 1041)   PRN Meds: acetaminophen **OR** acetaminophen, metoprolol tartrate   Vital Signs    Vitals:   12/06/21 1956 12/07/21 0000 12/07/21 0400 12/07/21 0744  BP: 113/68 106/68 95/67 112/79  Pulse: (!) 130 (!) 104 (!) 109 (!) 106  Resp: 19 20 20 18   Temp: 98 F (36.7 C) 98.1 F (36.7 C) 97.8 F (36.6 C) 97.8 F (36.6 C)  TempSrc: Oral Oral Oral Oral  SpO2: 95% 92% 91% 95%  Weight:      Height:        Intake/Output Summary (Last 24 hours) at 12/07/2021 2119 Last data filed at 12/07/2021 0700 Gross per 24 hour  Intake --  Output 1550 ml  Net -1550 ml   Last 3 Weights 12/06/2021 12/05/2021 07/07/2021  Weight (lbs) 155 lb 3.3 oz 147 lb 0.8 oz 147 lb  Weight (kg) 70.4 kg 66.7 kg 66.679 kg      Telemetry    Atrial fibrillation rate mildly elevated - Personally Reviewed  Physical Exam   GEN: No acute distress.   Neck: No JVD Cardiac: irregular and tachycardic Respiratory: Clear to auscultation bilaterally. GI: Soft, nontender, non-distended  MS: No edema Neuro:  Nonfocal  Psych: Normal affect   Labs     Chemistry Recent Labs  Lab 12/05/21 0138 12/06/21 0235 12/07/21 0310  NA 137 138 136  K 4.5 3.9 3.5  CL 104 105 106  CO2 16* 26 23  GLUCOSE 110* 150* 140*  BUN 68* 48* 27*  CREATININE 1.43* 0.93 0.70  CALCIUM 8.6* 8.2* 7.8*  PROT 6.8 5.5* 5.3*   ALBUMIN 3.3* 2.5* 2.2*  AST 103* 71* 70*  ALT 58* 43 52*  ALKPHOS 67 54 54  BILITOT 2.0* 1.1 1.0  GFRNONAA 47* >60 >60  ANIONGAP 17* 7 7   Hematology Recent Labs  Lab 12/05/21 0138 12/06/21 0235 12/07/21 0310  WBC 14.3* 9.2 8.2  RBC 3.27* 2.86* 2.74*  HGB 11.3* 9.6* 9.5*  HCT 35.2* 30.0* 28.7*  MCV 107.6* 104.9* 104.7*  MCH 34.6* 33.6 34.7*  MCHC 32.1 32.0 33.1  RDW 14.0 14.4 14.8  PLT 194 140* 128*   Thyroid  Recent Labs  Lab 12/05/21 1930  TSH 2.229     TTE 04/2021: IMPRESSIONS   1. Left ventricular ejection fraction, by estimation, is 60 to 65%. The  left ventricle has normal function. The left ventricle has no regional  wall motion abnormalities. Left ventricular diastolic parameters are  indeterminate.   2. Right ventricular systolic function is normal. The right ventricular  size is mildly enlarged. There is mildly elevated pulmonary artery  systolic pressure.   3. Left atrial size was severely dilated.   4. Right atrial size was mildly dilated.   5. The mitral valve is  normal in structure. Mild mitral valve  regurgitation. No evidence of mitral stenosis.   6. Tricuspid valve regurgitation is severe.   7. The aortic valve is tricuspid. There is moderate calcification of the  aortic valve. There is moderate thickening of the aortic valve. Aortic  valve regurgitation is not visualized. Mild to moderate aortic valve  sclerosis/calcification is present,  without any evidence of aortic stenosis.   8. There is mild dilatation of the aortic root, measuring 40 mm.   9. The inferior vena cava is dilated in size with >50% respiratory  variability, suggesting right atrial pressure of 8 mmHg.   Patient Profile     87 y.o. male with history of persistent Afib, prior CVA, hypothyroidism, and mild TAA who presented after a mechanical fall where he was down for 2 days found to have rhabdomyolysis and Afib with RVR. Cardiology is consulted regarding Afib with  RVR.  Assessment & Plan    1 Permanent atrial fibrillation-HR improving; continue amiodarone and toprol; continue apixaban. Repeat echo.  2 s/p Fall with rhabdomyolysis-dehydration likely contributing to tachycardia; should improve with hydration.  3 MR/TR-conservative measures given pt's age and overall medical condition.  For questions or updates, please contact [place] Please consult [email or web address] for contact info under        Signed, Kirk Ruths, MD  12/07/2021, 8:22 AM

## 2021-12-07 NOTE — TOC Progression Note (Signed)
Transition of Care [facility]) - Progression Note    Patient Details  Name: JLON BETKER MRN: [id number] Date of Birth: [birth year]  Transition of Care [facility]) CM/SW [place], [place] Phone Number: 12/07/2021, 11:39 AM  Clinical Narrative:     CSW met with pt and provided SNF bed offers. He explained his son's are eating lunch and that he will discuss choice with them once they return to room. CSW will follow up regarding SNF choice.    Expected Discharge Plan: [place] Barriers to Discharge: Continued Medical Work up  Expected Discharge Plan and Services Expected Discharge Plan: [place] In-house Referral: Clinical Social Work     Living arrangements for the past 2 months: Single Family Home                                       Social Determinants of Health (SDOH) Interventions    Readmission Risk Interventions No flowsheet data found.

## 2021-12-07 NOTE — Progress Notes (Signed)
PROGRESS NOTE  [name] ENI:[id number] DOB: 05/12/1930 DOA: 12/05/2021 PCP: [name], MD  Brief History   The patient is a 87 yr old man who was found on the floor by his son. The patient had been on the floor after a fall on 12/02/2021. He was too weak to get up or get to a phone.  In the ED he was found to have AF with RVR and a UTI. He has a past medical history of AF, Eliquis, CKD II, hypothyroidism, mitral and tricuspid regurgitation. CK was found to be 1889 with an elevation of creatinine. He was given IV fluids. CK is down to 600 this morning.  Cardiology was consulted for the patient's Atrial fib/rlutter with RVR. Beta blockers or CCB could not given due to the patient's hypotension. He has been started on amiodarone at least temporarily in order to control rate. It has been minimally effective. HR continues to be 130-150. The patient has remained hypotensive despite IV fluid resuscitation. He is on eliquis for stroke prophylaxis.  HR is markedly improved today at 100 +. No new complaints. Creatinine is at baseline.   Consultants  Cardiology  Procedures  None  Antibiotics   Anti-infectives (From admission, onward)    Start     Dose/Rate Route Frequency Ordered Stop   12/06/21 1500  dapsone tablet 50 mg        50 mg Oral Daily 12/06/21 1408     12/05/21 1045  cefTRIAXone (ROCEPHIN) 1 g in sodium chloride 0.9 % 100 mL IVPB        1 g 200 mL/hr over 30 Minutes Intravenous Every 24 hours 12/05/21 1038        Interval History/Subjective  This patient is resting comfortably. No new complaints.  Objective   Vitals:  Vitals:   12/07/21 1200 12/07/21 1500  BP: 101/66 97/65  Pulse: 97 96  Resp: 16 18  Temp:  98.6 F (37 C)  SpO2:  93%    Exam:  Constitutional:  The patient is awake, alert, and oriented x 3. No acute distress. Respiratory:  No increased work of breathing. No wheezes, rales, or rhonchi No tactile fremitus Cardiovascular:  Regular  rate and rhythm, Rate >120. No murmurs, ectopy, or gallups. No lateral PMI. No thrills. Abdomen:  Abdomen is soft, non-tender, non-distended No hernias, masses, or organomegaly Normoactive bowel sounds.  Musculoskeletal:  No cyanosis, clubbing, or edema Skin:  No rashes, lesions, ulcers palpation of skin: no induration or nodules Neurologic:  CN 2-12 intact Sensation all 4 extremities intact Psychiatric:  Mental status Mood, affect appropriate Orientation to person, place, time  judgment and insight appear intact  I have personally reviewed the following:   Today's Data  Vitals  Lab Data  CBC CMP  Micro Data  Blood culture x 2 - no growth Urine culture: E. coli  Imaging  CT head CT C-spine CXR X-ray of elbow X-ray of hip  X-ray L-spine  Cardiology Data  EKG  Scheduled Meds:  apixaban  5 mg Oral BID   collagenase   Topical Daily   dapsone  50 mg Oral Daily   feeding supplement  237 mL Oral TID BM   levothyroxine  50 mcg Oral Daily   metoprolol succinate  25 mg Oral Daily   sodium chloride flush  3 mL Intravenous Q12H   Continuous Infusions:  sodium chloride 75 mL/hr at 12/07/21 0518   amiodarone 30 mg/hr (12/07/21 1248)   cefTRIAXone (ROCEPHIN)  IV  1 g (12/07/21 0957)    Principal Problem:   Rhabdomyolysis Active Problems:   Macrocytic anemia   Hypothyroidism   Atrial fibrillation with RVR (HCC)   Mitral regurgitation   Acute renal failure superimposed on stage 2 chronic kidney disease (HCC)   Metabolic acidosis   Hypothermia   Pressure injury of skin   LOS: 2 days   A & P  1. Rhabdomyolysis; AKI superimposed on CKD II; metabolic acidosis  - Presents after falling early am of 12/14 and being too generally weak to get up and is found to have CK of 1889, BUN 68 and SCr 1.43 (22 & 0.87 previously), and bicarbonate 16   - Fluid resuscitated with NS in ED  - The patient was given IVF hydration with isotonic bicarbonate since admission.  - CK  down to 600 this morning.   -lactic acidosis resolved.  2. Sepsis: resolved.The patient has tachycardia, hypotension, hypothermia, lactic acidosis, leukocytosis, and AKI, on admission. Improved with IV fluids and antibiotics. However the patient continues to be hypotensive. Blood cultures x 2 have had no growth.   3. Atrial fibrillation with RVR   - Rate 160s with EMS and treated with 20 mg diltiazem prior to arrival  -Cardiology was consulted due to inability to control heart rate due to hypotension. The patient was started on a short course of amiodarone due to his ongoing hypotension.  - Pt started on amiodarone as BB and CCB are contraindicated due to hypotension.  -HR improved to 90's on amiodarone. I appreciate cardiology's assistance.  4. UTI: UA is positive for UTI. Weakness resulting in fall and unable to get off of the floor is due to UTI. Urine culture is positive for E. Coli. The patient has completed Rocephin for treatment.  5. Hypotension:   - Persists despite IV fluids and IV antibiotics. Antihypertensives are held. Likely multifactorial. Order echocardiogram.   6. Mitral and tricuspid regurgitation  - Severe TR and mild MR on TTE in May 2022  - Followed by cardiology, managed conservatively with diuretics - Hold Lasix for now while hydrating cautiously     7. Hypothermia  - Treated with active external warming briefly in ED, now resolved     8. Hypothyroidism  - Check TSH in light of general weakness and hypothermia, continue Synthroid. TSH is normal.  I have seen and examined this patient myself. I have spent 32 minutes in his evaluation and care.   DVT prophylaxis: Eliquis  Code Status: Full, discussed with patient  Level of Care: Level of care: Progressive Family Communication: None present  Disposition Plan:  Patient is from: home  Anticipated d/c is to: TBD Anticipated d/c date is: 12/08/21  Patient currently: dc pending transition to oral regimen to control  heart rate.  Keegen Heffern, DO [facility] Direct contact: see [email or web address]  7PM-7AM contact night coverage as above 12/07/2021, 4:20 PM  LOS: 1 day

## 2021-12-07 NOTE — Consult Note (Addendum)
[place] Nurse Consult Note: Reason for Consult: Consult requested for sacrum and right heel. Pt was found down at home after several days. Pt's sons are at the bedside to assess wound appearance and discuss plan of care.  Wound type: Sacrum with Unstageable pressure injury; 100% tightly adhered eschar, mod amt tan drainage, 7X5cm Right heel with Deep tissue pressure injury; dark red-purple intact blood blister, 3X2cm Middle back with Deep tissue pressure injury; dark red-purple in 3 areas; each approx 2X2cm Right hip with non blanching red Stage 1 pressure injury; 3X3cm Pressure Injury POA: Yes Dressing procedure/placement/frequency: Float right heel to reduce pressure.  Foam dressing to protect from further injury.   Topical treatment orders provided for bedside nurses to perform as follows to assist with enzymatic debridement of nonviable tissue: Apply Santyl to sacrum wound Q day, then cover with moist gauze and foam dressing.  (Change foam dressing Q 3 days or PRN soiling.) PT will change dressings Q Mon-Sat with hydrotherapy, BEDSIDE NURSE CHANGE Q SUN.  Pt could benefit from several days of hydrotherapy to assist with removal of nonviable tissue; this is not available in the home setting or most rehab centers.  [facility] [place] team will reassess wound with physical therapy next week if he is still in the hospital at that time.  Julien Girt MSN, RN, [place], [place], [place]

## 2021-12-08 ENCOUNTER — Inpatient Hospital Stay (HOSPITAL_COMMUNITY): Payer: Medicare Other

## 2021-12-08 DIAGNOSIS — M6282 Rhabdomyolysis: Secondary | ICD-10-CM

## 2021-12-08 LAB — COMPREHENSIVE METABOLIC PANEL
ALT: 44 U/L (ref 0–44)
AST: 47 U/L — ABNORMAL HIGH (ref 15–41)
Albumin: 2.1 g/dL — ABNORMAL LOW (ref 3.5–5.0)
Alkaline Phosphatase: 53 U/L (ref 38–126)
Anion gap: 6 (ref 5–15)
BUN: 21 mg/dL (ref 8–23)
CO2: 23 mmol/L (ref 22–32)
Calcium: 7.7 mg/dL — ABNORMAL LOW (ref 8.9–10.3)
Chloride: 106 mmol/L (ref 98–111)
Creatinine, Ser: 0.59 mg/dL — ABNORMAL LOW (ref 0.61–1.24)
GFR, Estimated: >60 mL/min (ref >60)
Glucose, Bld: 118 mg/dL — ABNORMAL HIGH (ref 70–99)
Potassium: 3.3 mmol/L — ABNORMAL LOW (ref 3.5–5.1)
Sodium: 135 mmol/L (ref 135–145)
Total Bilirubin: 1 mg/dL (ref 0.3–1.2)
Total Protein: 5.2 g/dL — ABNORMAL LOW (ref 6.5–8.1)

## 2021-12-08 LAB — CBC
HCT: 28.6 % — ABNORMAL LOW (ref 39.0–52.0)
Hemoglobin: 9.6 g/dL — ABNORMAL LOW (ref 13.0–17.0)
MCH: 35.2 pg — ABNORMAL HIGH (ref 26.0–34.0)
MCHC: 33.6 g/dL (ref 30.0–36.0)
MCV: 104.8 fL — ABNORMAL HIGH (ref 80.0–100.0)
Platelets: 137 10*3/uL — ABNORMAL LOW (ref 150–400)
RBC: 2.73 MIL/uL — ABNORMAL LOW (ref 4.22–5.81)
RDW: 15.3 % (ref 11.5–15.5)
WBC: 8.4 10*3/uL (ref 4.0–10.5)
nRBC: 0.2 % (ref 0.0–0.2)

## 2021-12-08 LAB — CK: Total CK: 143 U/L (ref 49–397)

## 2021-12-08 LAB — MRSA NEXT GEN BY PCR, NASAL: MRSA by PCR Next Gen: NOT DETECTED

## 2021-12-08 MED ORDER — SODIUM CHLORIDE 0.9 % IV SOLN: INTRAVENOUS | Status: DC

## 2021-12-08 MED ORDER — POLYETHYLENE GLYCOL 3350 17 G PO PACK
17.0000 g | PACK | Freq: Every day | ORAL | Status: DC
  Administered 2021-12-08 – 2021-12-15 (×8): 17 g via ORAL
  Filled 2021-12-08 (×8): qty 1

## 2021-12-08 MED ORDER — ORAL CARE MOUTH RINSE
15.0000 mL | Freq: Two times a day (BID) | OROMUCOSAL | Status: DC
  Administered 2021-12-08 – 2021-12-15 (×15): 15 mL via OROMUCOSAL

## 2021-12-08 MED ORDER — SENNOSIDES-DOCUSATE SODIUM 8.6-50 MG PO TABS
1.0000 | ORAL_TABLET | Freq: Two times a day (BID) | ORAL | Status: DC
  Administered 2021-12-08 – 2021-12-15 (×15): 1 via ORAL
  Filled 2021-12-08 (×15): qty 1

## 2021-12-08 MED ORDER — ACETAMINOPHEN 325 MG PO TABS
650.0000 mg | ORAL_TABLET | Freq: Four times a day (QID) | ORAL | Status: AC
  Administered 2021-12-08 – 2021-12-10 (×8): 650 mg via ORAL
  Filled 2021-12-08 (×8): qty 2

## 2021-12-08 MED ORDER — MORPHINE SULFATE (PF) 2 MG/ML IV SOLN
1.0000 mg | Freq: Once | INTRAVENOUS | Status: AC | PRN
  Administered 2021-12-08: 10:00:00 1 mg via INTRAVENOUS
  Filled 2021-12-08: qty 1

## 2021-12-08 NOTE — Progress Notes (Signed)
°   12/08/21 1930  Assess: MEWS Score  BP 93/65  Pulse Rate (!) 102  ECG Heart Rate 99  Resp (!) 25  SpO2 95 %  Assess: MEWS Score  MEWS Temp 0  MEWS Systolic 1  MEWS Pulse 0  MEWS RR 1  MEWS LOC 0  MEWS Score 2  MEWS Score Color Yellow  Assess: if the MEWS score is Yellow or Red  Were vital signs taken at a resting state? Yes  Focused Assessment No change from prior assessment  Early Detection of Sepsis Score *See Row Information* Low  MEWS guidelines implemented *See Row Information* No, previously yellow, continue vital signs every 4 hours   Yellow MEWS score already addressed by day shift RN. IV fluids ordered, per MD notify if MAP is less than 65. NO change in assessment, no signs of acute distress. Other VS WDL. Pt remains alert and oriented x 4. Will continue to monitor pt closely.

## 2021-12-08 NOTE — Progress Notes (Signed)
Progress Note  Patient Name: [name] Date of Encounter: 12/08/2021  [facility] Cardiologist: Kirk Ruths, MD   Subjective   Pt denies CP or dyspnea  Inpatient Medications    Scheduled Meds:  apixaban  5 mg Oral BID   collagenase   Topical Daily   dapsone  50 mg Oral Daily   feeding supplement  237 mL Oral TID BM   levothyroxine  50 mcg Oral Daily   mouth rinse  15 mL Mouth Rinse BID   metoprolol succinate  25 mg Oral Daily   polyethylene glycol  17 g Oral Daily   senna-docusate  1 tablet Oral BID   sodium chloride flush  3 mL Intravenous Q12H   Continuous Infusions:  sodium chloride 75 mL/hr at 12/07/21 2206   amiodarone 30 mg/hr (12/08/21 0719)   PRN Meds: acetaminophen **OR** acetaminophen, metoprolol tartrate   Vital Signs    Vitals:   12/08/21 0000 12/08/21 0030 12/08/21 0400 12/08/21 0632  BP: (!) 107/58     Pulse: 100 84  (!) 106  Resp: (!) 21 20  17   Temp: 98.7 F (37.1 C)  98.1 F (36.7 C)   TempSrc: Oral  Oral   SpO2: 93% 95%  93%  Weight:      Height:        Intake/Output Summary (Last 24 hours) at 12/08/2021 0750 Last data filed at 12/07/2021 0800 Gross per 24 hour  Intake 240 ml  Output --  Net 240 ml    Last 3 Weights 12/06/2021 12/05/2021 07/07/2021  Weight (lbs) 155 lb 3.3 oz 147 lb 0.8 oz 147 lb  Weight (kg) 70.4 kg 66.7 kg 66.679 kg      Telemetry    Atrial fibrillation rate mildly elevated - Personally Reviewed  Physical Exam   GEN: Frail, NAD Neck: supple Cardiac: irregular Respiratory: CTA GI: Soft, NT/ND MS: trace ankle edema Neuro:  Grossly intact Psych: Normal affect   Labs     Chemistry Recent Labs  Lab 12/06/21 0235 12/07/21 0310 12/08/21 0423  NA 138 136 135  K 3.9 3.5 3.3*  CL 105 106 106  CO2 26 23 23   GLUCOSE 150* 140* 118*  BUN 48* 27* 21  CREATININE 0.93 0.70 0.59*  CALCIUM 8.2* 7.8* 7.7*  PROT 5.5* 5.3* 5.2*  ALBUMIN 2.5* 2.2* 2.1*  AST 71* 70* 47*  ALT 43 52* 44  ALKPHOS  54 54 53  BILITOT 1.1 1.0 1.0  GFRNONAA >60 >60 >60  ANIONGAP 7 7 6     Hematology Recent Labs  Lab 12/06/21 0235 12/07/21 0310 12/08/21 0423  WBC 9.2 8.2 8.4  RBC 2.86* 2.74* 2.73*  HGB 9.6* 9.5* 9.6*  HCT 30.0* 28.7* 28.6*  MCV 104.9* 104.7* 104.8*  MCH 33.6 34.7* 35.2*  MCHC 32.0 33.1 33.6  RDW 14.4 14.8 15.3  PLT 140* 128* 137*    Thyroid  Recent Labs  Lab 12/05/21 1930  TSH 2.229      TTE 04/2021: IMPRESSIONS   1. Left ventricular ejection fraction, by estimation, is 60 to 65%. The  left ventricle has normal function. The left ventricle has no regional  wall motion abnormalities. Left ventricular diastolic parameters are  indeterminate.   2. Right ventricular systolic function is normal. The right ventricular  size is mildly enlarged. There is mildly elevated pulmonary artery  systolic pressure.   3. Left atrial size was severely dilated.   4. Right atrial size was mildly dilated.   5. The  mitral valve is normal in structure. Mild mitral valve  regurgitation. No evidence of mitral stenosis.   6. Tricuspid valve regurgitation is severe.   7. The aortic valve is tricuspid. There is moderate calcification of the  aortic valve. There is moderate thickening of the aortic valve. Aortic  valve regurgitation is not visualized. Mild to moderate aortic valve  sclerosis/calcification is present,  without any evidence of aortic stenosis.   8. There is mild dilatation of the aortic root, measuring 40 mm.   9. The inferior vena cava is dilated in size with >50% respiratory  variability, suggesting right atrial pressure of 8 mmHg.   Patient Profile     87 y.o. male with history of persistent Afib, prior CVA, hypothyroidism, and mild TAA who presented after a mechanical fall where he was down for 2 days found to have rhabdomyolysis and Afib with RVR. Cardiology is consulted regarding Afib with RVR.  Assessment & Plan    1 Permanent atrial fibrillation-HR controlled at  present; continue amiodarone today and will likely DC in AM; continue toprol. Elevated HR at time of admission likely related to dehydration. Echo shows normal LV function.  2 s/p Fall with rhabdomyolysis-dehydration improving; DC IVF's and follow. Resume low dose diuretic at DC.  3 MR/TR-conservative measures given pt's age and overall medical condition.  For questions or updates, please contact [place] Please consult [email or web address] for contact info under        Signed, Kirk Ruths, MD  12/08/2021, 7:50 AM

## 2021-12-08 NOTE — Progress Notes (Signed)
Physical Therapy Treatment Patient Details Name: [name] MRN: [id number] DOB: November 27, 1930 Today's Date: 12/08/2021   History of Present Illness 87 y.o. male presents to [facility] hospital on 12/05/2021 after experiencing a fall at home on 12/14. Pt was down for multiple before his son was able to find him. PT admitted for rhabdomyolysis, afib with RVR. PMH includes atrial fibrillation on Eliquis, CKD stage II, hypothyroidism, mitral and tricuspid cuspid regurgitation.    PT Comments    Pt admitted with above diagnosis. Pt was able to assist with transfer sit to stand to [facility] with +2 mod to max assist for most mobility.   Pt making progress but is still limited by a large wound on buttocks limiting his ability to sit up in chair as well as poor endurance for activity. Will continue PT and progress pt as able. Pt currently with functional limitations due to balance and endurance deficits. Pt will benefit from skilled PT to increase their independence and safety with mobility to allow discharge to the venue listed below.   Recommendations for follow up therapy are one component of a multi-disciplinary discharge planning process, led by the attending physician.  Recommendations may be updated based on patient status, additional functional criteria and insurance authorization.  Follow Up Recommendations  Skilled nursing-short term rehab (<3 hours/day)     Assistance Recommended at Discharge Intermittent Supervision/Assistance  Equipment Recommendations  Wheelchair (measurements PT);Wheelchair cushion (measurements PT);Hospital bed    Recommendations for Other Services       Precautions / Restrictions Precautions Precautions: Fall Precaution Comments: afib RVR Restrictions Weight Bearing Restrictions: No     Mobility  Bed Mobility Overal bed mobility: Needs Assistance Bed Mobility: Supine to Sit;Sit to Supine     Supine to sit: Max assist Sit to supine: Total assist;+2 for physical  assistance   General bed mobility comments: Max A for bring BLEs towards EOB and then elevating trunk    Transfers Overall transfer level: Needs assistance Equipment used:  [name]) Transfers: Sit to/from Stand Sit to Stand: Mod assist;+2 physical assistance;From elevated surface           General transfer comment: Pt ablee to stand from the bed and from 3N1 to the [facility] with mod assist of 2 persons.  Pt needed assist and cues with use of pad to achieve incr hip and knee extension with pt remaining in a slightly flexed posture the entire time. Pt needed constant cues to maintain standing and flexes at hips, knees and trunk as he fatigues. Pt able to stand seconds and then sat on pads to be moved to the 3N1. Had BM and then stood to be cleaned and was moved back to bed for [place].    Ambulation/Gait                   Stairs             Wheelchair Mobility    Modified Rankin (Stroke Patients Only)       Balance Overall balance assessment: Needs assistance Sitting-balance support: Bilateral upper extremity supported;Feet supported Sitting balance-Leahy Scale: Poor Sitting balance - Comments: minG-minA Postural control: Posterior lean Standing balance support: Bilateral upper extremity supported;During functional activity Standing balance-Leahy Scale: Poor Standing balance comment: reliant on UE support and physical A                            Cognition Arousal/Alertness: Awake/alert Behavior During Therapy: [facility] for  tasks assessed/performed Overall Cognitive Status: Within Functional Limits for tasks assessed                                 General Comments: Very motivated and able to problem solve compensatory techniques        Exercises General Exercises - Lower Extremity Long Arc Quad: AROM;Both;5 reps;Seated (right weaker than left)    General Comments General comments (skin integrity, edema, etc.): HR elevatng to 130-145  bpm with activity. SOn arriving at end of session and very supportive      Pertinent Vitals/Pain Pain Assessment: Faces Faces Pain Scale: Hurts even more Pain Location: BLE Pain Descriptors / Indicators: Sore Pain Intervention(s): Limited activity within patient's tolerance;Monitored during session;Repositioned    Home Living Family/patient expects to be discharged to:: Private residence Living Arrangements: Alone Available Help at Discharge: Family;Available PRN/intermittently Type of Home: House Home Access: Stairs to enter Entrance Stairs-Rails: None Entrance Stairs-Number of Steps: 1   Home Layout: One level Home Equipment: Rollator (4 wheels);Cane - single point;BSC/3in1;Grab bars - tub/shower      Prior Function            PT Goals (current goals can now be found in the care plan section) Acute Rehab PT Goals Patient Stated Goal: to reduce pain and improve mobility quality Progress towards PT goals: Progressing toward goals    Frequency    Min 2X/week      PT Plan Current plan remains appropriate    Co-evaluation PT/OT/SLP Co-Evaluation/Treatment: Yes Reason for Co-Treatment: Complexity of the patient's impairments (multi-system involvement);For patient/therapist safety PT goals addressed during session: Mobility/safety with mobility OT goals addressed during session: ADL's and self-care      AM-PAC PT "6 Clicks" Mobility   Outcome Measure  Help needed turning from your back to your side while in a flat bed without using bedrails?: Total Help needed moving from lying on your back to sitting on the side of a flat bed without using bedrails?: Total Help needed moving to and from a bed to a chair (including a wheelchair)?: Total Help needed standing up from a chair using your arms (e.g., wheelchair or bedside chair)?: Total Help needed to walk in hospital room?: Total Help needed climbing 3-5 steps with a railing? : Total 6 Click Score: 6    End of  Session Equipment Utilized During Treatment: Gait belt Activity Tolerance: Patient limited by pain;Patient limited by fatigue Patient left: in bed;with call bell/phone within reach;with bed alarm set Nurse Communication: Mobility status;Need for lift equipment PT Visit Diagnosis: Other abnormalities of gait and mobility (R26.89);Muscle weakness (generalized) (M62.81);Pain Pain - part of body:  (back and BLE)     Time: 4967-5916 PT Time Calculation (min) (ACUTE ONLY): 21 min  Charges:  $Therapeutic Activity: 8-22 mins                     Genowefa Morga M,PT Acute Rehab Services [phone] (pager)    [name] 12/08/2021, 11:21 AM

## 2021-12-08 NOTE — TOC Progression Note (Addendum)
Transition of Care [facility]) - Progression Note    Patient Details  Name: [name] MRN: [id number] Date of Birth: November 11, 1930  Transition of Care [facility]) CM/SW IXL, [facility] Phone Number: 12/08/2021, 2:35 PM  Clinical Narrative:      CSW spoke with pt son [name] regarding offers. He is requesting that referrals be sent to additional facilities: [facility], [facility]. CSW did notify son that pt did get an offer at [facility] as well. CSW faxed referral to [facility] in the hub. CSW paper faxed to [facility]([phone]). CSW contacted [facility] liaison and request that she review pt. Expected Discharge Plan: [place] Barriers to Discharge: Continued Medical Work up  Expected Discharge Plan and Services Expected Discharge Plan: [place] In-house Referral: Clinical Social Work     Living arrangements for the past 2 months: Single Family Home                                       Social Determinants of Health (SDOH) Interventions    Readmission Risk Interventions No flowsheet data found.

## 2021-12-08 NOTE — Progress Notes (Signed)
°   12/08/21 2324  Assess: MEWS Score  Temp 98.3 F (36.8 C)  BP (!) 86/58  Pulse Rate (!) 103  ECG Heart Rate (!) 101  Resp 17  Level of Consciousness Alert  SpO2 97 %  O2 Device Room Air  Assess: MEWS Score  MEWS Temp 0  MEWS Systolic 1  MEWS Pulse 1  MEWS RR 0  MEWS LOC 0  MEWS Score 2  MEWS Score Color Yellow  Assess: if the MEWS score is Yellow or Red  Were vital signs taken at a resting state? Yes  Focused Assessment No change from prior assessment  Early Detection of Sepsis Score *See Row Information* Low  MEWS guidelines implemented *See Row Information* No, previously yellow, continue vital signs every 4 hours   MD on call notified about BP.

## 2021-12-08 NOTE — Care Management Important Message (Signed)
Important Message  Patient Details  Name: [name] MRN: [id number] Date of Birth: [birth year]   Medicare Important Message Given:  Yes     Hannah Beat 12/08/2021, 1:33 PM

## 2021-12-08 NOTE — Progress Notes (Signed)
Pt unable to stay standing for orthostatics.

## 2021-12-08 NOTE — Progress Notes (Signed)
Progress Note    ANTHONYMICHAEL MUNDAY   IZT:[id number]  DOB: [birth year]  DOA: 12/05/2021     3 PCP: [name], MD  Initial CC: fall at home  Hospital Course: Mr. Loper is an 86 yr old male who was found on the floor by his son. The patient had been on the floor after a fall on 12/02/2021. He was too weak to get up or get to a phone.   In the ED he was found to have AF with RVR and a UTI. He has a past medical history of AF, Eliquis, CKD II, hypothyroidism, mitral and tricuspid regurgitation. CK was found to be 1889 with an elevation of creatinine. He was given IV fluids and CK downtrended.    Cardiology was consulted for the patient's Atrial fib/rlutter with RVR. He improved on amiodarone.     Interval History:  No events overnight.  Son present bedside this morning.  He was complaining of some worsening pain in his right knee during working with therapy earlier today.  Assessment & Plan: Rhabdomyolysis; AKI superimposed on CKD II; metabolic acidosis  - Presents after falling early am of 12/14 and being too generally weak to get up and is found to have CK of 1889, BUN 68 and SCr 1.43 (22 & 0.87 previously), and bicarbonate 16   - Fluid resuscitated with NS in ED  - The patient was given IVF hydration with isotonic bicarbonate since admission.  - CK downtrended well; lactic acidosis resolved.  Right knee pain - B/L knee swelling but R knee has some TTP today - most likely underlying arthritic changes - xray obtained: degenerative changes and small joint effusion; no acute fx - continue supportive care and monitor for any worsening  - use PRN tylenol; will do 2 day course of scheduled tylenol as well    Sepsis due to UTI: resolved. The patient has tachycardia, hypotension, hypothermia, lactic acidosis, leukocytosis, and AKI, on admission. UA consistent with urinary source. Improved with IV fluids and antibiotics. However the patient continues to be hypotensive. Blood cultures x 2  have had no growth.   Atrial fibrillation with RVR   - Rate 160s with EMS and treated with 20 mg diltiazem prior to arrival  -Cardiology was consulted due to inability to control heart rate due to hypotension. The patient was started on a short course of amiodarone due to his ongoing hypotension.  - Pt started on amiodarone as BB and CCB are contraindicated due to hypotension.  -HR improved on amio; possibly to be stopped on Wednesday per cardiology   UTI: UA is positive for UTI. Weakness resulting in fall and unable to get off of the floor is due to UTI. Urine culture is positive for E. Coli. The patient has completed Rocephin for treatment.   Mitral and tricuspid regurgitation  - Severe TR and mild MR on TTE in May 2022  - Followed by cardiology, managed conservatively with diuretics - Hold Lasix for now while hydrating cautiously     Hypothermia  - Treated with active external warming briefly in ED, now resolved     Hypothyroidism  - Check TSH in light of general weakness and hypothermia, continue Synthroid. TSH is normal.  Old records reviewed in assessment of this patient  Antimicrobials: Rocephin 12/17 >> 12/19  DVT prophylaxis: Eliquis  Code Status:   Code Status: Full Code  Disposition Plan:   Status is: Inpt  Objective: Blood pressure 103/73, pulse 97, temperature (!) 97.5 F (  36.4 C), temperature source Oral, resp. rate 18, height 6' (1.829 m), weight 70.4 kg, SpO2 97 %.  Examination:  Physical Exam Constitutional:      General: He is not in acute distress.    Appearance: Normal appearance.  HENT:     Head: Normocephalic and atraumatic.     Mouth/Throat:     Mouth: Mucous membranes are moist.  Eyes:     Extraocular Movements: Extraocular movements intact.  Cardiovascular:     Rate and Rhythm: Normal rate and regular rhythm.     Heart sounds: Normal heart sounds.  Pulmonary:     Effort: Pulmonary effort is normal. No respiratory distress.     Breath  sounds: Normal breath sounds. No wheezing.  Abdominal:     General: Bowel sounds are normal. There is no distension.     Palpations: Abdomen is soft.     Tenderness: There is no abdominal tenderness.  Musculoskeletal:        General: Normal range of motion.     Cervical back: Normal range of motion and neck supple.  Skin:    General: Skin is warm and dry.  Neurological:     General: No focal deficit present.     Mental Status: He is alert.  Psychiatric:        Mood and Affect: Mood normal.        Behavior: Behavior normal.     Consultants:    Procedures:    Data Reviewed: I have personally reviewed labs and imaging studies    LOS: 3 days  Time spent: Greater than 50% of the 35 minute visit was spent in counseling/coordination of care for the patient as laid out in the A&P.   Dwyane Dee, MD [facility] 12/08/2021, 3:24 PM

## 2021-12-08 NOTE — Evaluation (Signed)
Occupational Therapy Evaluation Patient Details Name: [name] MRN: [id number] DOB: Oct 23, 1930 Today's Date: 12/08/2021   History of Present Illness 87 y.o. male presents to [facility] hospital on 12/05/2021 after experiencing a fall at home on 12/14. Pt was down for multiple before his son was able to find him. PT admitted for rhabdomyolysis, afib with RVR. PMH includes atrial fibrillation on Eliquis, CKD stage II, hypothyroidism, mitral and tricuspid cuspid regurgitation.   Clinical Impression   PTA, pt was living alone and was independent with ADLs; using a cane vs rollator for mobility. Pt currently requiring Mod A for UB ADLs, Max A for LB ADLs, and Mod A +2 for sit<>stands with stedy. Pt presenting with decreased strength, balance, and activity tolerance.  Despite pain and fatigue, pt very motivated to participate in therapy and performed sit<>Stand x3 to complete toileting at [facility]. Of note, pt reporting pain at R knee with both knee extension and flexion; difficulty weight bearing through RLE due to pain.  Notified RN and MD. Pt would benefit from further acute OT to facilitate safe dc. Recommend dc to SNF for further OT to optimize safety, independence with ADLs, and return to PLOF.      Recommendations for follow up therapy are one component of a multi-disciplinary discharge planning process, led by the attending physician.  Recommendations may be updated based on patient status, additional functional criteria and insurance authorization.   Follow Up Recommendations  Skilled nursing-short term rehab (<3 hours/day)    Assistance Recommended at Discharge Frequent or constant Supervision/Assistance  Functional Status Assessment  Patient has had a recent decline in their functional status and demonstrates the ability to make significant improvements in function in a reasonable and predictable amount of time.  Equipment Recommendations  BSC/3in1    Recommendations for Other Services PT  consult     Precautions / Restrictions Precautions Precautions: Fall Precaution Comments: afib RVR      Mobility Bed Mobility Overal bed mobility: Needs Assistance Bed Mobility: Supine to Sit;Sit to Supine     Supine to sit: Max assist Sit to supine: Total assist;+2 for physical assistance   General bed mobility comments: Max A for bring BLEs towards EOB and then elevating trunk    Transfers                          Balance Overall balance assessment: Needs assistance Sitting-balance support: Bilateral upper extremity supported;Feet supported Sitting balance-Leahy Scale: Poor Sitting balance - Comments: minG-minA Postural control: Posterior lean Standing balance support: Bilateral upper extremity supported;During functional activity Standing balance-Leahy Scale: Poor Standing balance comment: reliant on UE support and physical A                           ADL either performed or assessed with clinical judgement   ADL Overall ADL's : Needs assistance/impaired Eating/Feeding: Set up;Bed level;Sitting   Grooming: Minimal assistance;Wash/dry face;Bed level   Upper Body Bathing: Moderate assistance;Sitting   Lower Body Bathing: Maximal assistance;Sit to/from stand   Upper Body Dressing : Moderate assistance;Sitting   Lower Body Dressing: Maximal assistance;Sit to/from stand   Toilet Transfer: Total assistance;+2 for physical assistance (stand pivot with stedy) Toilet Transfer Details (indicate cue type and reason): Total A with use of stedy to get to [facility]. Mod A for power up and progressing to MIn A. Toileting- Clothing Manipulation and Hygiene: Total assistance Toileting - Clothing Manipulation Details (indicate cue  type and reason): Total A for peri care after BM     Functional mobility during ADLs: Minimal assistance;Moderate assistance;+2 for physical assistance (sit<>stand with stedy) General ADL Comments: PT presenting with decreased  balance and strength.     Vision Baseline Vision/History: 1 Wears glasses Patient Visual Report: No change from baseline       Perception     Praxis      Pertinent Vitals/Pain Pain Assessment: Faces Faces Pain Scale: Hurts even more Pain Location: BLE Pain Descriptors / Indicators: Sore     Hand Dominance     Extremity/Trunk Assessment Upper Extremity Assessment Upper Extremity Assessment: RUE deficits/detail;LUE deficits/detail RUE Deficits / Details: Limited strength and ROM. Difficulty raising RUE to reach for stedy bar. Noting edema RUE Coordination: decreased fine motor;decreased gross motor LUE Deficits / Details: Limited strength and AROM. Edema noted   Lower Extremity Assessment Lower Extremity Assessment: Defer to PT evaluation   Cervical / Trunk Assessment Cervical / Trunk Assessment: Kyphotic   Communication Communication Communication: No difficulties   Cognition Arousal/Alertness: Awake/alert Behavior During Therapy: WFL for tasks assessed/performed Overall Cognitive Status: Within Functional Limits for tasks assessed                                 General Comments: Very motivated and able to problem solve compensatory techniques     General Comments  HR elevaitng to 130s with activity. SOn arriving at end of session and very supportive    Exercises     Shoulder Instructions      Home Living Family/patient expects to be discharged to:: Private residence Living Arrangements: Alone Available Help at Discharge: Family;Available PRN/intermittently Type of Home: House Home Access: Stairs to enter Entrance Stairs-Number of Steps: 1 Entrance Stairs-Rails: None Home Layout: One level     Bathroom Shower/Tub: Teacher, early years/pre: Handicapped height     Home Equipment: Rollator (4 wheels);Cane - single point;BSC/3in1;Grab bars - tub/shower          Prior Functioning/Environment Prior Level of Function :  Independent/Modified Independent             Mobility Comments: ambulates with cane vs Rollator ADLs Comments: Pt performs ADLs. Daughter-in-law and son assist with IADLs        OT Problem List: Decreased strength;Decreased range of motion;Decreased activity tolerance;Impaired balance (sitting and/or standing);Decreased knowledge of use of DME or AE;Decreased knowledge of precautions;Pain      OT Treatment/Interventions: Self-care/ADL training;Therapeutic exercise;Energy conservation;DME and/or AE instruction;Therapeutic activities;Patient/family education    OT Goals(Current goals can be found in the care plan section) Acute Rehab OT Goals Patient Stated Goal: Get stronger OT Goal Formulation: With patient/family Time For Goal Achievement: 12/22/21 Potential to Achieve Goals: Good  OT Frequency: Min 2X/week   Barriers to D/C:            Co-evaluation PT/OT/SLP Co-Evaluation/Treatment: Yes Reason for Co-Treatment: To address functional/ADL transfers;For patient/therapist safety   OT goals addressed during session: ADL's and self-care      AM-PAC OT "6 Clicks" Daily Activity     Outcome Measure Help from another person eating meals?: A Little Help from another person taking care of personal grooming?: A Little Help from another person toileting, which includes using toliet, bedpan, or urinal?: A Lot Help from another person bathing (including washing, rinsing, drying)?: A Lot Help from another person to put on and taking off regular upper body clothing?:  A Lot Help from another person to put on and taking off regular lower body clothing?: A Lot 6 Click Score: 14   End of Session Equipment Utilized During Treatment: Gait belt Nurse Communication: Mobility status;Other (comment) (R knee pain, bleeding at IV on R arm, small BM)  Activity Tolerance: Patient tolerated treatment well Patient left: in bed;with call bell/phone within reach;with bed alarm set  OT Visit  Diagnosis: Unsteadiness on feet (R26.81);Other abnormalities of gait and mobility (R26.89);Muscle weakness (generalized) (M62.81);History of falling (Z91.81);Pain Pain - Right/Left: Right Pain - part of body: Knee                Time: 9373-4287 OT Time Calculation (min): 34 min Charges:  OT General Charges $OT Visit: 1 Visit OT Evaluation $OT Eval Moderate Complexity: [place], OTR/L Acute Rehab Pager: [phone] Office: [place] 12/08/2021, 9:56 AM

## 2021-12-08 NOTE — Progress Notes (Signed)
Physical Therapy Wound Treatment Patient Details  Name: [name] MRN: [id number] Date of Birth: 18-Aug-1930  Today's Date: 12/08/2021 Time: 1015-1054 Time Calculation (min): 39 min  Subjective  Subjective Assessment Subjective: Pt agreeable to hydrotherapy treatment; pt son present initially Patient and Family Stated Goals: for wounds to heal Date of Onset:  (unknown) Prior Treatments:  (dressing change)  Pain Score:  2/10  Wound Assessment  Pressure Injury 12/05/21 Sacrum Unstageable - Full thickness tissue loss in which the base of the injury is covered by slough (yellow, tan, gray, green or brown) and/or eschar (tan, brown or black) in the wound bed. (Active)  Wound Image   12/08/21 1109  Dressing Type Barrier Film (skin prep);Foam - Lift dressing to assess site every shift;Gauze (Comment);Moist to dry;Santyl 12/08/21 1109  Dressing Changed;Clean;Intact;Dry 12/08/21 1109  Dressing Change Frequency Daily 12/08/21 1109  State of Healing Eschar 12/08/21 1109  Site / Wound Assessment Black;Yellow 12/08/21 1109  % Wound base Red or Granulating 0% 12/08/21 1109  % Wound base Yellow/Fibrinous Exudate 5% 12/08/21 1109  % Wound base Black/Eschar 95% 12/08/21 1109  % Wound base Other/Granulation Tissue (Comment) 0% 12/08/21 1109  Peri-wound Assessment Intact;Pink;Erythema (non-blanchable) 12/08/21 1109  Wound Length (cm) 6 cm 12/08/21 1109  Wound Width (cm) 7 cm 12/08/21 1109  Wound Depth (cm) 0.1 cm 12/08/21 1109  Wound Surface Area (cm^2) 42 cm^2 12/08/21 1109  Wound Volume (cm^3) 4.2 cm^3 12/08/21 1109  Drainage Amount Minimal 12/08/21 1109  Drainage Description Serosanguineous 12/08/21 1109  Treatment Debridement (Selective);Hydrotherapy (Pulse lavage);Packing (Saline gauze) 12/08/21 1109      Hydrotherapy Pulsed lavage therapy - wound location: sacrum Pulsed Lavage with Suction (psi): 12 psi (8-12) Pulsed Lavage with Suction - Normal Saline Used: 1000 mL Pulsed Lavage  Tip: Tip with splash shield Selective Debridement Selective Debridement - Location: sacrum Selective Debridement - Tools Used: Forceps, Scalpel, Scissors Selective Debridement - Tissue Removed: eschar    Wound Assessment and Plan  Wound Therapy - Assess/Plan/Recommendations Wound Therapy - Clinical Statement: Pt presents to hydrotherapy with unstageable sacral wound. Will benefit from pulsatile lavage and selective debridement to remove necrotic tissue and promote healing. Wound Therapy - Functional Problem List: decreased mobility Factors Delaying/Impairing Wound Healing: Immobility Hydrotherapy Plan: Debridement, Dressing change, Patient/family education, Pulsatile lavage with suction Wound Therapy - Frequency: 6X / week Wound Therapy - Follow Up Recommendations: dressing changes by RN  Wound Therapy Goals- Improve the function of patient's integumentary system by progressing the wound(s) through the phases of wound healing (inflammation - proliferation - remodeling) by: Wound Therapy Goals - Improve the function of patient's integumentary system by progressing the wound(s) through the phases of wound healing by: Decrease Necrotic Tissue to: 50 Decrease Necrotic Tissue - Progress: Goal set today Increase Granulation Tissue to: 50 Increase Granulation Tissue - Progress: Goal set today Goals/treatment plan/discharge plan were made with and agreed upon by patient/family: Yes Time For Goal Achievement: 7 days Wound Therapy - Potential for Goals: Fair  Goals will be updated until maximal potential achieved or discharge criteria met.  Discharge criteria: when goals achieved, discharge from hospital, MD decision/surgical intervention, no progress towards goals, refusal/missing three consecutive treatments without notification or medical reason.  GP     Charges PT Wound Care Charges $Wound Debridement up to 20 cm: < or equal to 20 cm $ Wound Debridement each add'l 20 sqcm: 2 $PT PLS Gun  and Tip: 1 Supply $PT Hydrotherapy Visit: 1 Visit     Wyona Almas, PT,  DPT Acute Rehabilitation Services Pager [phone] Office [phone]   Deno Etienne 12/08/2021, 12:59 PM

## 2021-12-08 NOTE — Progress Notes (Signed)
This nurse Miguel Rota, MD about pt BP. MD instructed this nurse no changes need to be made right now.

## 2021-12-09 DIAGNOSIS — M25552 Pain in left hip: Secondary | ICD-10-CM

## 2021-12-09 LAB — CBC WITH DIFFERENTIAL/PLATELET
Abs Immature Granulocytes: 0.11 10*3/uL — ABNORMAL HIGH (ref 0.00–0.07)
Basophils Absolute: 0 10*3/uL (ref 0.0–0.1)
Basophils Relative: 0 %
Eosinophils Absolute: 0.2 10*3/uL (ref 0.0–0.5)
Eosinophils Relative: 2 %
HCT: 29.7 % — ABNORMAL LOW (ref 39.0–52.0)
Hemoglobin: 10 g/dL — ABNORMAL LOW (ref 13.0–17.0)
Immature Granulocytes: 1 %
Lymphocytes Relative: 15 %
Lymphs Abs: 1.3 10*3/uL (ref 0.7–4.0)
MCH: 35.1 pg — ABNORMAL HIGH (ref 26.0–34.0)
MCHC: 33.7 g/dL (ref 30.0–36.0)
MCV: 104.2 fL — ABNORMAL HIGH (ref 80.0–100.0)
Monocytes Absolute: 0.7 10*3/uL (ref 0.1–1.0)
Monocytes Relative: 8 %
Neutro Abs: 6.4 10*3/uL (ref 1.7–7.7)
Neutrophils Relative %: 74 %
Platelets: 140 10*3/uL — ABNORMAL LOW (ref 150–400)
RBC: 2.85 MIL/uL — ABNORMAL LOW (ref 4.22–5.81)
RDW: 15.4 % (ref 11.5–15.5)
WBC: 8.6 10*3/uL (ref 4.0–10.5)
nRBC: 0 % (ref 0.0–0.2)

## 2021-12-09 LAB — MAGNESIUM: Magnesium: 1.9 mg/dL (ref 1.7–2.4)

## 2021-12-09 LAB — COMPREHENSIVE METABOLIC PANEL
ALT: 39 U/L (ref 0–44)
AST: 44 U/L — ABNORMAL HIGH (ref 15–41)
Albumin: 2 g/dL — ABNORMAL LOW (ref 3.5–5.0)
Alkaline Phosphatase: 51 U/L (ref 38–126)
Anion gap: 5 (ref 5–15)
BUN: 24 mg/dL — ABNORMAL HIGH (ref 8–23)
CO2: 22 mmol/L (ref 22–32)
Calcium: 7.6 mg/dL — ABNORMAL LOW (ref 8.9–10.3)
Chloride: 106 mmol/L (ref 98–111)
Creatinine, Ser: 0.64 mg/dL (ref 0.61–1.24)
GFR, Estimated: >60 mL/min (ref >60)
Glucose, Bld: 113 mg/dL — ABNORMAL HIGH (ref 70–99)
Potassium: 3.2 mmol/L — ABNORMAL LOW (ref 3.5–5.1)
Sodium: 133 mmol/L — ABNORMAL LOW (ref 135–145)
Total Bilirubin: 0.9 mg/dL (ref 0.3–1.2)
Total Protein: 5 g/dL — ABNORMAL LOW (ref 6.5–8.1)

## 2021-12-09 LAB — RESP PANEL BY RT-PCR (FLU A&B, COVID) ARPGX2
Influenza A by PCR: NEGATIVE
Influenza B by PCR: NEGATIVE
SARS Coronavirus 2 by RT PCR: NEGATIVE

## 2021-12-09 MED ORDER — MAGIC MOUTHWASH
15.0000 mL | Freq: Three times a day (TID) | ORAL | Status: DC | PRN
  Administered 2021-12-10 – 2021-12-11 (×2): 15 mL via ORAL
  Filled 2021-12-09 (×4): qty 15

## 2021-12-09 MED ORDER — SODIUM CHLORIDE 0.9 % IV SOLN
1.0000 g | INTRAVENOUS | Status: AC
  Administered 2021-12-09 – 2021-12-11 (×3): 1 g via INTRAVENOUS
  Filled 2021-12-09 (×3): qty 10

## 2021-12-09 MED ORDER — AMIODARONE HCL 200 MG PO TABS
200.0000 mg | ORAL_TABLET | Freq: Two times a day (BID) | ORAL | Status: DC
  Administered 2021-12-09 – 2021-12-14 (×11): 200 mg via ORAL
  Filled 2021-12-09 (×11): qty 1

## 2021-12-09 MED ORDER — POTASSIUM CHLORIDE CRYS ER 20 MEQ PO TBCR
40.0000 meq | EXTENDED_RELEASE_TABLET | Freq: Once | ORAL | Status: AC
  Administered 2021-12-09: 14:00:00 40 meq via ORAL
  Filled 2021-12-09: qty 2

## 2021-12-09 MED ORDER — POTASSIUM CHLORIDE CRYS ER 20 MEQ PO TBCR
40.0000 meq | EXTENDED_RELEASE_TABLET | Freq: Once | ORAL | Status: AC
  Administered 2021-12-09: 09:00:00 40 meq via ORAL
  Filled 2021-12-09: qty 2

## 2021-12-09 MED ORDER — OXYCODONE HCL 5 MG PO TABS
5.0000 mg | ORAL_TABLET | Freq: Four times a day (QID) | ORAL | Status: DC | PRN
  Administered 2021-12-09 – 2021-12-12 (×3): 5 mg via ORAL
  Filled 2021-12-09 (×3): qty 1

## 2021-12-09 NOTE — Progress Notes (Signed)
Physical Therapy Wound Treatment Patient Details  Name: [name] MRN: [id number] Date of Birth: 22-May-1930  Today's Date: 12/09/2021 Time: 2500-3704 Time Calculation (min): 45 min  Subjective  Subjective Assessment Subjective: Pt agreeable to hydrotherapy treatment; pt son present initially Patient and Family Stated Goals: for wounds to heal Date of Onset:  (unknown) Prior Treatments:  (dressing change)  Pain Score:  2/10 premedicated  Wound Assessment  Pressure Injury 12/05/21 Sacrum Unstageable - Full thickness tissue loss in which the base of the injury is covered by slough (yellow, tan, gray, green or brown) and/or eschar (tan, brown or black) in the wound bed. (Active)  Wound Image   12/08/21 1109  Dressing Type Barrier Film (skin prep);ABD;Gauze (Comment);Moist to dry;Normal saline moist dressing;Santyl 12/09/21 1947  Dressing Clean;Dry;Intact 12/09/21 1947  Dressing Change Frequency Daily 12/09/21 1947  State of Healing Eschar 12/09/21 1947  Site / Wound Assessment Brown;Yellow 12/09/21 1947  % Wound base Red or Granulating 5% 12/09/21 1947  % Wound base Yellow/Fibrinous Exudate 90% 12/09/21 1947  % Wound base Black/Eschar 5% 12/09/21 1947  % Wound base Other/Granulation Tissue (Comment) 0% 12/09/21 1947  Peri-wound Assessment Other (Comment) 12/09/21 1947  Wound Length (cm) 6 cm 12/08/21 1109  Wound Width (cm) 7 cm 12/08/21 1109  Wound Depth (cm) 0.1 cm 12/08/21 1109  Wound Surface Area (cm^2) 42 cm^2 12/08/21 1109  Wound Volume (cm^3) 4.2 cm^3 12/08/21 1109  Margins Unattached edges (unapproximated) 12/09/21 1947  Drainage Amount Moderate 12/09/21 1947  Drainage Description Serous;Sanguineous 12/09/21 1947  Treatment Cleansed;Debridement (Selective);Hydrotherapy (Pulse lavage);Packing (Saline gauze) 12/09/21 1947      Hydrotherapy Pulsed lavage therapy - wound location: sacrum Pulsed Lavage with Suction (psi): 12 psi (8-12) Pulsed Lavage with Suction - Normal  Saline Used: 1000 mL Pulsed Lavage Tip: Tip with splash shield Selective Debridement Selective Debridement - Location: sacrum Selective Debridement - Tools Used: Forceps, Scalpel, Scissors Selective Debridement - Tissue Removed: eschar    Wound Assessment and Plan  Wound Therapy - Assess/Plan/Recommendations Wound Therapy - Clinical Statement: Pt presents to hydrotherapy with unstageable sacral wound. Will benefit from pulsatile lavage and selective debridement to remove necrotic tissue and promote healing. Wound Therapy - Functional Problem List: decreased mobility Factors Delaying/Impairing Wound Healing: Immobility Hydrotherapy Plan: Debridement, Dressing change, Patient/family education, Pulsatile lavage with suction Wound Therapy - Frequency: 6X / week Wound Therapy - Follow Up Recommendations: dressing changes by RN  Wound Therapy Goals- Improve the function of patient's integumentary system by progressing the wound(s) through the phases of wound healing (inflammation - proliferation - remodeling) by: Wound Therapy Goals - Improve the function of patient's integumentary system by progressing the wound(s) through the phases of wound healing by: Decrease Necrotic Tissue to: 50 Decrease Necrotic Tissue - Progress: Progressing toward goal Increase Granulation Tissue to: 50 Increase Granulation Tissue - Progress: Progressing toward goal Goals/treatment plan/discharge plan were made with and agreed upon by patient/family: Yes Time For Goal Achievement: 7 days Wound Therapy - Potential for Goals: Fair  Goals will be updated until maximal potential achieved or discharge criteria met.  Discharge criteria: when goals achieved, discharge from hospital, MD decision/surgical intervention, no progress towards goals, refusal/missing three consecutive treatments without notification or medical reason.  GP     Charges PT Wound Care Charges $Wound Debridement up to 20 cm: < or equal to 20  cm $ Wound Debridement each add'l 20 sqcm: 2 $PT PLS Gun and Tip: 1 Supply $PT Hydrotherapy Visit: 1 Visit  [name] 12/09/2021, 7:50 PM 12/09/2021  [name]., PT Acute Rehabilitation Services [phone]  (pager) [phone]  (office)

## 2021-12-09 NOTE — Progress Notes (Signed)
[facility]  PROGRESS NOTE  [name] PTW:[id number] DOB: Aug 10, 1930 DOA: 12/05/2021 PCP: [name], MD   Brief HPI:   87 year old male who was found on the floor by his son.  Patient has been on floor after a fall on 12/02/2021.  He was too weak to get up to get to the phone.  In the ED he was found to have A. fib with RVR and UTI.  He has past medical history of atrial fibrillation, on Eliquis, CKD stage II, hypothyroidism, mitral and tricuspid regurgitation.  CK was found to be elevated at 1889 with elevation of creatinine.  He was given IV fluids and CK down trended. Cardiology was consulted for the patient's atrial flutter with RVR.  He improved on amiodarone    Subjective   Patient seen and examined, denies any complaints.   Assessment/Plan:    Rhabdomyolysis/AKI superimposed on CKD stage II\ -Presented on 12/14 after a fall, CK was elevated at 889, BUN 68, creatinine 1.43, bicarb 16 -Started on fluid resuscitation in the ED -Improved with isotonic bicarbonate IVF -CK is down to 143 -He is off IV fluids  Sepsis due to UTI -Presented with tachycardia, hypotension, hypothermia, lactic acidosis, AKI on admission -Improved with IV fluids and antibiotics -Urine culture grew E. Coli -Patient was treated with ceftriaxone -We will continue with ceftriaxone  Right knee pain -Patient has bilateral leg swelling -Likely underlying arthritic changes -X-ray obtained showed degenerative changes with small joint effusion, no acute fracture -Continue supportive care -Continue as needed Tylenol  Atrial fibrillation with RVR -Presented with A. fib with RVR -Cardiology was consulted, due to hypotension he was started on amiodarone -Currently he is off beta-blockers and on amiodarone 200 mg p.o. twice daily  Mitral and tricuspid regurgitation -Severe TR and mild MR on TTE in May 2022 -Followed by cardiology Lasix on hold  Hypothermia -Likely due to sepsis due to  UTI -Resolved  Hypothyroidism -TSH is normal -Continue Synthroid  Hypokalemia -Potassium replaced -Follow BMP in am    Medications     acetaminophen  650 mg Oral QID   amiodarone  200 mg Oral BID   apixaban  5 mg Oral BID   collagenase   Topical Daily   dapsone  50 mg Oral Daily   feeding supplement  237 mL Oral TID BM   levothyroxine  50 mcg Oral Daily   mouth rinse  15 mL Mouth Rinse BID   polyethylene glycol  17 g Oral Daily   senna-docusate  1 tablet Oral BID   sodium chloride flush  3 mL Intravenous Q12H     Data Reviewed:   CBG:  No results for input(s): GLUCAP in the last 168 hours.  SpO2: 98 %    Vitals:   12/09/21 0541 12/09/21 0628 12/09/21 0747 12/09/21 1451  BP:  (!) 88/59 91/64   Pulse: (!) 105 (!) 102 (!) 103   Resp: 14 18 18    Temp:  98.4 F (36.9 C)  98.3 F (36.8 C)  TempSrc:  Oral    SpO2: 95% 96% 98%   Weight:      Height:         Intake/Output Summary (Last 24 hours) at 12/09/2021 1734 Last data filed at 12/09/2021 0900 Gross per 24 hour  Intake 1831.84 ml  Output 1050 ml  Net 781.84 ml    12/19 1901 - 12/21 0700 In: 1471.8 [I.V.:1471.8] Out: 450 [Urine:450]  Filed Weights   12/05/21 0306 12/06/21 0514 12/09/21  0500  Weight: 66.7 kg 70.4 kg 78 kg    Data Reviewed: Basic Metabolic Panel: Recent Labs  Lab 12/05/21 0138 12/06/21 0235 12/07/21 0310 12/08/21 0423 12/09/21 0405  NA 137 138 136 135 133*  K 4.5 3.9 3.5 3.3* 3.2*  CL 104 105 106 106 106  CO2 16* 26 23 23 22   GLUCOSE 110* 150* 140* 118* 113*  BUN 68* 48* 27* 21 24*  CREATININE 1.43* 0.93 0.70 0.59* 0.64  CALCIUM 8.6* 8.2* 7.8* 7.7* 7.6*  MG  --   --   --   --  1.9   Liver Function Tests: Recent Labs  Lab 12/05/21 0138 12/06/21 0235 12/07/21 0310 12/08/21 0423 12/09/21 0405  AST 103* 71* 70* 47* 44*  ALT 58* 43 52* 44 39  ALKPHOS 67 54 54 53 51  BILITOT 2.0* 1.1 1.0 1.0 0.9  PROT 6.8 5.5* 5.3* 5.2* 5.0*  ALBUMIN 3.3* 2.5* 2.2* 2.1* 2.0*    No results for input(s): LIPASE, AMYLASE in the last 168 hours. No results for input(s): AMMONIA in the last 168 hours. CBC: Recent Labs  Lab 12/05/21 0138 12/06/21 0235 12/07/21 0310 12/08/21 0423 12/09/21 0405  WBC 14.3* 9.2 8.2 8.4 8.6  NEUTROABS 12.4*  --   --   --  6.4  HGB 11.3* 9.6* 9.5* 9.6* 10.0*  HCT 35.2* 30.0* 28.7* 28.6* 29.7*  MCV 107.6* 104.9* 104.7* 104.8* 104.2*  PLT 194 140* 128* 137* 140*   Cardiac Enzymes: Recent Labs  Lab 12/05/21 0138 12/06/21 0235 12/07/21 0310 12/08/21 0423  CKTOTAL 1,889* 676* 312 143   BNP (last 3 results) No results for input(s): BNP in the last 8760 hours.  ProBNP (last 3 results) No results for input(s): PROBNP in the last 8760 hours.  CBG: No results for input(s): GLUCAP in the last 168 hours.     Radiology Reports  DG Knee 1-2 Views Right  Result Date: 12/08/2021 CLINICAL DATA:  Right knee pain following fall several days ago, initial encounter EXAM: RIGHT KNEE - 2 VIEW COMPARISON:  None. FINDINGS: Small joint effusion is noted. Degenerative changes in the patellofemoral space are seen. No acute fracture or dislocation is noted. Diffuse meniscal calcifications and vascular calcifications are seen. IMPRESSION: Degenerative change and small joint effusion. No acute bony abnormality noted. Electronically Signed   By: Inez Catalina M.D.   On: 12/08/2021 15:29       Antibiotics: Anti-infectives (From admission, onward)    Start     Dose/Rate Route Frequency Ordered Stop   12/06/21 1500  dapsone tablet 50 mg        50 mg Oral Daily 12/06/21 1408     12/05/21 1045  cefTRIAXone (ROCEPHIN) 1 g in sodium chloride 0.9 % 100 mL IVPB  Status:  Discontinued        1 g 200 mL/hr over 30 Minutes Intravenous Every 24 hours 12/05/21 1038 12/07/21 1618         DVT prophylaxis: Apixaban  Code Status: Full code  Family Communication: No family at bedside   Consultants:   Procedures:     Objective    Physical  Examination:   General-appears in no acute distress Heart-S1-S2, regular, no murmur auscultated Lungs-clear to auscultation bilaterally, no wheezing or crackles auscultated Abdomen-soft, nontender, no organomegaly Extremities-no edema in the lower extremities Neuro-alert, oriented x3, no focal deficit noted   Status is: Inpatient  Dispo: The patient is from: Home  Anticipated d/c is to: Skilled nursing facility              Anticipated d/c date is: 12/10/2021              Patient currently stable for discharge  Barrier to discharge-awaiting bed at skilled nursing facility  COVID-19 Labs  No results for input(s): DDIMER, FERRITIN, LDH, CRP in the last 72 hours.  Lab Results  Component Value Date   [place] NEGATIVE 12/09/2021   [place] NEGATIVE 12/05/2021     Pressure Injury 12/05/21 Sacrum Unstageable - Full thickness tissue loss in which the base of the injury is covered by slough (yellow, tan, gray, green or brown) and/or eschar (tan, brown or black) in the wound bed. (Active)  12/05/21 2100  Location: Sacrum  Location Orientation:   Staging: Unstageable - Full thickness tissue loss in which the base of the injury is covered by slough (yellow, tan, gray, green or brown) and/or eschar (tan, brown or black) in the wound bed.  Wound Description (Comments):   Present on Admission: Yes     Pressure Injury 12/06/21 Heel Right Deep Tissue Pressure Injury - Purple or maroon localized area of discolored intact skin or blood-filled blister due to damage of underlying soft tissue from pressure and/or shear. (Active)  12/06/21 1800  Location: Heel  Location Orientation: Right  Staging: Deep Tissue Pressure Injury - Purple or maroon localized area of discolored intact skin or blood-filled blister due to damage of underlying soft tissue from pressure and/or shear.  Wound Description (Comments):   Present on Admission: Yes     Pressure Injury 12/07/21 Hip Left  Stage 1 -  Intact skin with non-blanchable redness of a localized area usually over a bony prominence. (Active)  12/07/21   Location: Hip  Location Orientation: Left  Staging: Stage 1 -  Intact skin with non-blanchable redness of a localized area usually over a bony prominence.  Wound Description (Comments):   Present on Admission: Yes     Pressure Injury 12/07/21 Vertebral column Medial Deep Tissue Pressure Injury - Purple or maroon localized area of discolored intact skin or blood-filled blister due to damage of underlying soft tissue from pressure and/or shear. 3 areas of deep tissue pr (Active)  12/07/21   Location: Vertebral column  Location Orientation: Medial  Staging: Deep Tissue Pressure Injury - Purple or maroon localized area of discolored intact skin or blood-filled blister due to damage of underlying soft tissue from pressure and/or shear.  Wound Description (Comments): 3 areas of deep tissue pressure injuries, each approx 2X2cm  Present on Admission: Yes        Recent Results (from the past 240 hour(s))  Urine Culture     Status: Abnormal   Collection Time: 12/05/21  1:38 AM   Specimen: Urine, Clean Catch  Result Value Ref Range Status   Specimen Description URINE, CLEAN CATCH  Final   Special Requests   Final    NONE Performed at [facility], [place] [address], [place], [place] [address]    Culture >=100,000 COLONIES/mL ESCHERICHIA COLI (A)  Final   Report Status 12/07/2021 FINAL  Final   Organism ID, Bacteria ESCHERICHIA COLI (A)  Final      Susceptibility   Escherichia coli - MIC*    AMPICILLIN >=32 RESISTANT Resistant     CEFAZOLIN 32 INTERMEDIATE Intermediate     CEFEPIME <=0.12 SENSITIVE Sensitive     CEFTRIAXONE <=0.25 SENSITIVE Sensitive     CIPROFLOXACIN <=0.25 SENSITIVE  Sensitive     GENTAMICIN <=1 SENSITIVE Sensitive     IMIPENEM <=0.25 SENSITIVE Sensitive     NITROFURANTOIN <=16 SENSITIVE Sensitive     TRIMETH/SULFA <=20 SENSITIVE Sensitive      AMPICILLIN/SULBACTAM >=32 RESISTANT Resistant     PIP/TAZO 32 INTERMEDIATE Intermediate     * >=100,000 COLONIES/mL ESCHERICHIA COLI  Blood culture (routine x 2)     Status: None (Preliminary result)   Collection Time: 12/05/21  1:45 AM   Specimen: BLOOD  Result Value Ref Range Status   Specimen Description BLOOD RIGHT ARM  Final   Special Requests   Final    BOTTLES DRAWN AEROBIC AND ANAEROBIC Blood Culture results may not be optimal due to an excessive volume of blood received in culture bottles   Culture   Final    NO GROWTH 4 DAYS Performed at [facility], [place] [address] [place], [place] [address]    Report Status PENDING  Incomplete  Resp Panel by RT-PCR (Flu A&B, Covid) Nasopharyngeal Swab     Status: None   Collection Time: 12/05/21  2:30 AM   Specimen: Nasopharyngeal Swab; Nasopharyngeal(NP) swabs in vial transport medium  Result Value Ref Range Status   SARS Coronavirus 2 by RT PCR NEGATIVE NEGATIVE Final    Comment: (NOTE) SARS-CoV-2 target nucleic acids are NOT DETECTED.  The SARS-CoV-2 RNA is generally detectable in upper respiratory specimens during the acute phase of infection. The lowest concentration of SARS-CoV-2 viral copies this assay can detect is 138 copies/mL. A negative result does not preclude SARS-Cov-2 infection and should not be used as the sole basis for treatment or other patient management decisions. A negative result may occur with  improper specimen collection/handling, submission of specimen other than nasopharyngeal swab, presence of viral mutation(s) within the areas targeted by this assay, and inadequate number of viral copies(<138 copies/mL). A negative result must be combined with clinical observations, patient history, and epidemiological information. The expected result is Negative.  Fact Sheet for Patients:  EntrepreneurPulse.com.au  Fact Sheet for Healthcare Providers:   IncredibleEmployment.be  This test is no t yet approved or cleared by the [place] FDA and  has been authorized for detection and/or diagnosis of SARS-CoV-2 by FDA under an Emergency Use Authorization (EUA). This EUA will remain  in effect (meaning this test can be used) for the duration of the COVID-19 declaration under Section 564(b)(1) of the Act, 21 U.S.C.section 360bbb-3(b)(1), unless the authorization is terminated  or revoked sooner.       Influenza A by PCR NEGATIVE NEGATIVE Final   Influenza B by PCR NEGATIVE NEGATIVE Final    Comment: (NOTE) The Xpert Xpress SARS-CoV-2/FLU/RSV plus assay is intended as an aid in the diagnosis of influenza from Nasopharyngeal swab specimens and should not be used as a sole basis for treatment. Nasal washings and aspirates are unacceptable for Xpert Xpress SARS-CoV-2/FLU/RSV testing.  Fact Sheet for Patients: EntrepreneurPulse.com.au  Fact Sheet for Healthcare Providers: IncredibleEmployment.be  This test is not yet approved or cleared by the [place] FDA and has been authorized for detection and/or diagnosis of SARS-CoV-2 by FDA under an Emergency Use Authorization (EUA). This EUA will remain in effect (meaning this test can be used) for the duration of the COVID-19 declaration under Section 564(b)(1) of the Act, 21 U.S.C. section 360bbb-3(b)(1), unless the authorization is terminated or revoked.  Performed at [facility], [place] [address], [place], [place] [address]   Blood culture (routine x 2)  Status: None (Preliminary result)   Collection Time: 12/05/21  8:35 AM   Specimen: BLOOD LEFT FOREARM  Result Value Ref Range Status   Specimen Description BLOOD LEFT FOREARM  Final   Special Requests   Final    BOTTLES DRAWN AEROBIC AND ANAEROBIC Blood Culture results may not be optimal due to an inadequate volume of blood received in culture bottles   Culture    Final    NO GROWTH 4 DAYS Performed at [facility], [place] [address] [place], [place] [address]    Report Status PENDING  Incomplete  MRSA Next Gen by PCR, Nasal     Status: None   Collection Time: 12/07/21  7:54 PM   Specimen: Nasal Mucosa; Nasal Swab  Result Value Ref Range Status   MRSA by PCR Next Gen NOT DETECTED NOT DETECTED Final    Comment: (NOTE) The GeneXpert MRSA Assay (FDA approved for NASAL specimens only), is one component of a comprehensive MRSA colonization surveillance program. It is not intended to diagnose MRSA infection nor to guide or monitor treatment for MRSA infections. Test performance is not FDA approved in patients less than 18 years old. Performed at [facility], [place] [address], [place],  [address]   Resp Panel by RT-PCR (Flu A&B, Covid) Nasopharyngeal Swab     Status: None   Collection Time: 12/09/21 10:39 AM   Specimen: Nasopharyngeal Swab; Nasopharyngeal(NP) swabs in vial transport medium  Result Value Ref Range Status   SARS Coronavirus 2 by RT PCR NEGATIVE NEGATIVE Final    Comment: (NOTE) SARS-CoV-2 target nucleic acids are NOT DETECTED.  The SARS-CoV-2 RNA is generally detectable in upper respiratory specimens during the acute phase of infection. The lowest concentration of SARS-CoV-2 viral copies this assay can detect is 138 copies/mL. A negative result does not preclude SARS-Cov-2 infection and should not be used as the sole basis for treatment or other patient management decisions. A negative result may occur with  improper specimen collection/handling, submission of specimen other than nasopharyngeal swab, presence of viral mutation(s) within the areas targeted by this assay, and inadequate number of viral copies(<138 copies/mL). A negative result must be combined with clinical observations, patient history, and epidemiological information. The expected result is Negative.  Fact Sheet for Patients:   EntrepreneurPulse.com.au  Fact Sheet for Healthcare Providers:  IncredibleEmployment.be  This test is no t yet approved or cleared by the [place] FDA and  has been authorized for detection and/or diagnosis of SARS-CoV-2 by FDA under an Emergency Use Authorization (EUA). This EUA will remain  in effect (meaning this test can be used) for the duration of the COVID-19 declaration under Section 564(b)(1) of the Act, 21 U.S.C.section 360bbb-3(b)(1), unless the authorization is terminated  or revoked sooner.       Influenza A by PCR NEGATIVE NEGATIVE Final   Influenza B by PCR NEGATIVE NEGATIVE Final    Comment: (NOTE) The Xpert Xpress SARS-CoV-2/FLU/RSV plus assay is intended as an aid in the diagnosis of influenza from Nasopharyngeal swab specimens and should not be used as a sole basis for treatment. Nasal washings and aspirates are unacceptable for Xpert Xpress SARS-CoV-2/FLU/RSV testing.  Fact Sheet for Patients: EntrepreneurPulse.com.au  Fact Sheet for Healthcare Providers: IncredibleEmployment.be  This test is not yet approved or cleared by the [place] FDA and has been authorized for detection and/or diagnosis of SARS-CoV-2 by FDA under an Emergency Use Authorization (EUA). This EUA will remain in effect (meaning this test can  be used) for the duration of the COVID-19 declaration under Section 564(b)(1) of the Act, 21 U.S.C. section 360bbb-3(b)(1), unless the authorization is terminated or revoked.  Performed at [facility], [place] [address] [place], [place] [address]     [facility] If 7PM-7AM, please contact night-coverage at [email or web address], Office  [phone]   12/09/2021, 5:34 PM  LOS: 4 days

## 2021-12-09 NOTE — Plan of Care (Signed)
  Problem: Nutrition: Goal: Adequate nutrition will be maintained Outcome: Progressing   Problem: Elimination: Goal: Will not experience complications related to bowel motility Outcome: Progressing   Problem: Pain Managment: Goal: General experience of comfort will improve Outcome: Progressing   Problem: Safety: Goal: Ability to remain free from injury will improve Outcome: Progressing   

## 2021-12-09 NOTE — TOC Progression Note (Addendum)
Transition of Care [facility]) - Progression Note    Patient Details  Name: CHOZEN LATULIPPE MRN: [id number] Date of Birth: March 17, 1930  Transition [facility]) CM/SW Contact  Joanne Chars, LCSW Phone Number: 12/09/2021, 11:16 AM  Clinical Narrative:   Message from son Merry Proud.  They are touring [facility].  Also asked about clapps/PG.    CSW contacted Olivia Mackie at [facility] and they do not have any beds currently.  Merry Proud informed.  CSW spoke with Claiborne Billings at [facility] and they will have bed available tomorrow.    1300: TC son Merry Proud.  Tour at [facility] was actually today, they do want to accept bed offer there.      Expected Discharge Plan: [place] Barriers to Discharge: Continued Medical Work up  Expected Discharge Plan and Services Expected Discharge Plan: [place] In-house Referral: Clinical Social Work     Living arrangements for the past 2 months: Single Family Home                                       Social Determinants of Health (SDOH) Interventions    Readmission Risk Interventions No flowsheet data found.

## 2021-12-09 NOTE — Progress Notes (Signed)
Progress Note  Patient Name: [name] Date of Encounter: 12/09/2021  [facility] Cardiologist: Kirk Ruths, MD   Subjective   No CP or dyspnea  Inpatient Medications    Scheduled Meds:  acetaminophen  650 mg Oral QID   apixaban  5 mg Oral BID   collagenase   Topical Daily   dapsone  50 mg Oral Daily   feeding supplement  237 mL Oral TID BM   levothyroxine  50 mcg Oral Daily   mouth rinse  15 mL Mouth Rinse BID   metoprolol succinate  25 mg Oral Daily   polyethylene glycol  17 g Oral Daily   senna-docusate  1 tablet Oral BID   sodium chloride flush  3 mL Intravenous Q12H   Continuous Infusions:  sodium chloride 75 mL/hr at 12/09/21 0735   amiodarone 30 mg/hr (12/08/21 1938)   PRN Meds: acetaminophen **OR** acetaminophen, metoprolol tartrate   Vital Signs    Vitals:   12/09/21 0533 12/09/21 0541 12/09/21 0628 12/09/21 0747  BP:   (!) 88/59 91/64  Pulse: 100 (!) 105 (!) 102   Resp: 15 14 18    Temp:   98.4 F (36.9 C)   TempSrc:   Oral   SpO2: 95% 95% 96%   Weight:      Height:        Intake/Output Summary (Last 24 hours) at 12/09/2021 0750 Last data filed at 12/08/2021 1736 Gross per 24 hour  Intake 1471.84 ml  Output 450 ml  Net 1021.84 ml    Last 3 Weights 12/09/2021 12/06/2021 12/05/2021  Weight (lbs) 171 lb 15.3 oz 155 lb 3.3 oz 147 lb 0.8 oz  Weight (kg) 78 kg 70.4 kg 66.7 kg      Telemetry    Atrial fibrillation rate upper normal - Personally Reviewed  Physical Exam   GEN: NAD Neck: JVP not elevated Cardiac: irregular, 2/6 systolic murmur Respiratory: CTA anteriorly GI: Soft, NT/ND, no masses MS: 1+ ankle edema Neuro:  No focal findings Psych: Normal affect   Labs     Chemistry Recent Labs  Lab 12/07/21 0310 12/08/21 0423 12/09/21 0405  NA 136 135 133*  K 3.5 3.3* 3.2*  CL 106 106 106  CO2 23 23 22   GLUCOSE 140* 118* 113*  BUN 27* 21 24*  CREATININE 0.70 0.59* 0.64  CALCIUM 7.8* 7.7* 7.6*  MG  --   --   1.9  PROT 5.3* 5.2* 5.0*  ALBUMIN 2.2* 2.1* 2.0*  AST 70* 47* 44*  ALT 52* 44 39  ALKPHOS 54 53 51  BILITOT 1.0 1.0 0.9  GFRNONAA >60 >60 >60  ANIONGAP 7 6 5     Hematology Recent Labs  Lab 12/07/21 0310 12/08/21 0423 12/09/21 0405  WBC 8.2 8.4 8.6  RBC 2.74* 2.73* 2.85*  HGB 9.5* 9.6* 10.0*  HCT 28.7* 28.6* 29.7*  MCV 104.7* 104.8* 104.2*  MCH 34.7* 35.2* 35.1*  MCHC 33.1 33.6 33.7  RDW 14.8 15.3 15.4  PLT 128* 137* 140*    Thyroid  Recent Labs  Lab 12/05/21 1930  TSH 2.229      TTE 04/2021: IMPRESSIONS   1. Left ventricular ejection fraction, by estimation, is 60 to 65%. The  left ventricle has normal function. The left ventricle has no regional  wall motion abnormalities. Left ventricular diastolic parameters are  indeterminate.   2. Right ventricular systolic function is normal. The right ventricular  size is mildly enlarged. There is mildly elevated pulmonary artery  systolic pressure.   3. Left atrial size was severely dilated.   4. Right atrial size was mildly dilated.   5. The mitral valve is normal in structure. Mild mitral valve  regurgitation. No evidence of mitral stenosis.   6. Tricuspid valve regurgitation is severe.   7. The aortic valve is tricuspid. There is moderate calcification of the  aortic valve. There is moderate thickening of the aortic valve. Aortic  valve regurgitation is not visualized. Mild to moderate aortic valve  sclerosis/calcification is present,  without any evidence of aortic stenosis.   8. There is mild dilatation of the aortic root, measuring 40 mm.   9. The inferior vena cava is dilated in size with >50% respiratory  variability, suggesting right atrial pressure of 8 mmHg.   Patient Profile     87 y.o. male with history of persistent Afib, prior CVA, hypothyroidism, and mild TAA who presented after a mechanical fall where he was down for 2 days found to have rhabdomyolysis and Afib with RVR. Cardiology is consulted  regarding Afib with RVR.  Assessment & Plan    1 Permanent atrial fibrillation-heart rate upper normal.  Blood pressure is borderline with systolic of 88 this morning.  Discontinue IV amiodarone and begin 200 mg by mouth twice daily for 1 week then 200 mg daily thereafter.  Discontinue Toprol.  If heart rate increases can add low-dose digoxin.  Continue apixaban.  Echocardiogram with normal LV function.    2 s/p Fall with rhabdomyolysis-patient appears to be euvolemic at present with some volume excess with lower extremity edema.  We will discontinue IV fluids.  Will likely need to resume low-dose Lasix at discharge.  Follow exam closely.    3 MR/TR-conservative measures given pt's age and overall medical condition.  For questions or updates, please contact [place] Please consult [email or web address] for contact info under        Signed, Kirk Ruths, MD  12/09/2021, 7:50 AM

## 2021-12-09 NOTE — Progress Notes (Signed)
Patient transferred to a air mattress, specialty bed.

## 2021-12-10 ENCOUNTER — Other Ambulatory Visit: Payer: Self-pay | Admitting: Internal Medicine

## 2021-12-10 DIAGNOSIS — E038 Other specified hypothyroidism: Secondary | ICD-10-CM

## 2021-12-10 LAB — CBC WITH DIFFERENTIAL/PLATELET
Abs Immature Granulocytes: 0.08 10*3/uL — ABNORMAL HIGH (ref 0.00–0.07)
Basophils Absolute: 0 10*3/uL (ref 0.0–0.1)
Basophils Relative: 0 %
Eosinophils Absolute: 0.1 10*3/uL (ref 0.0–0.5)
Eosinophils Relative: 1 %
HCT: 29.5 % — ABNORMAL LOW (ref 39.0–52.0)
Hemoglobin: 9.5 g/dL — ABNORMAL LOW (ref 13.0–17.0)
Immature Granulocytes: 1 %
Lymphocytes Relative: 15 %
Lymphs Abs: 1.3 10*3/uL (ref 0.7–4.0)
MCH: 34.1 pg — ABNORMAL HIGH (ref 26.0–34.0)
MCHC: 32.2 g/dL (ref 30.0–36.0)
MCV: 105.7 fL — ABNORMAL HIGH (ref 80.0–100.0)
Monocytes Absolute: 0.7 10*3/uL (ref 0.1–1.0)
Monocytes Relative: 7 %
Neutro Abs: 7 10*3/uL (ref 1.7–7.7)
Neutrophils Relative %: 76 %
Platelets: 165 10*3/uL (ref 150–400)
RBC: 2.79 MIL/uL — ABNORMAL LOW (ref 4.22–5.81)
RDW: 15.4 % (ref 11.5–15.5)
WBC: 9.3 10*3/uL (ref 4.0–10.5)
nRBC: 0 % (ref 0.0–0.2)

## 2021-12-10 LAB — CBC
HCT: 32.3 % — ABNORMAL LOW (ref 39.0–52.0)
Hemoglobin: 10.6 g/dL — ABNORMAL LOW (ref 13.0–17.0)
MCH: 35 pg — ABNORMAL HIGH (ref 26.0–34.0)
MCHC: 32.8 g/dL (ref 30.0–36.0)
MCV: 106.6 fL — ABNORMAL HIGH (ref 80.0–100.0)
Platelets: 190 10*3/uL (ref 150–400)
RBC: 3.03 MIL/uL — ABNORMAL LOW (ref 4.22–5.81)
RDW: 15.5 % (ref 11.5–15.5)
WBC: 9.4 10*3/uL (ref 4.0–10.5)
nRBC: 0 % (ref 0.0–0.2)

## 2021-12-10 LAB — CULTURE, BLOOD (ROUTINE X 2)
Culture: NO GROWTH
Culture: NO GROWTH

## 2021-12-10 LAB — COMPREHENSIVE METABOLIC PANEL
ALT: 42 U/L (ref 0–44)
AST: 49 U/L — ABNORMAL HIGH (ref 15–41)
Albumin: 1.9 g/dL — ABNORMAL LOW (ref 3.5–5.0)
Alkaline Phosphatase: 58 U/L (ref 38–126)
Anion gap: 5 (ref 5–15)
BUN: 23 mg/dL (ref 8–23)
CO2: 22 mmol/L (ref 22–32)
Calcium: 7.8 mg/dL — ABNORMAL LOW (ref 8.9–10.3)
Chloride: 105 mmol/L (ref 98–111)
Creatinine, Ser: 0.63 mg/dL (ref 0.61–1.24)
GFR, Estimated: >60 mL/min (ref >60)
Glucose, Bld: 118 mg/dL — ABNORMAL HIGH (ref 70–99)
Potassium: 4.2 mmol/L (ref 3.5–5.1)
Sodium: 132 mmol/L — ABNORMAL LOW (ref 135–145)
Total Bilirubin: 0.9 mg/dL (ref 0.3–1.2)
Total Protein: 5.1 g/dL — ABNORMAL LOW (ref 6.5–8.1)

## 2021-12-10 LAB — MAGNESIUM: Magnesium: 2 mg/dL (ref 1.7–2.4)

## 2021-12-10 MED ORDER — DIGOXIN 125 MCG PO TABS
0.1250 mg | ORAL_TABLET | Freq: Every day | ORAL | Status: DC
  Administered 2021-12-10 – 2021-12-15 (×6): 0.125 mg via ORAL
  Filled 2021-12-10 (×6): qty 1

## 2021-12-10 NOTE — Progress Notes (Signed)
Progress Note  Patient Name: [name] Date of Encounter: 12/10/2021  [facility] Cardiologist: Kirk Ruths, MD   Subjective   Denies CP or dyspnea  Inpatient Medications    Scheduled Meds:  acetaminophen  650 mg Oral QID   amiodarone  200 mg Oral BID   apixaban  5 mg Oral BID   collagenase   Topical Daily   dapsone  50 mg Oral Daily   feeding supplement  237 mL Oral TID BM   levothyroxine  50 mcg Oral Daily   mouth rinse  15 mL Mouth Rinse BID   polyethylene glycol  17 g Oral Daily   senna-docusate  1 tablet Oral BID   sodium chloride flush  3 mL Intravenous Q12H   Continuous Infusions:  cefTRIAXone (ROCEPHIN)  IV 1 g (12/09/21 1823)   PRN Meds: acetaminophen **OR** acetaminophen, magic mouthwash, metoprolol tartrate, oxyCODONE   Vital Signs    Vitals:   12/09/21 1451 12/09/21 2045 12/10/21 0259 12/10/21 0746  BP:  104/80 106/77 105/79  Pulse:  (!) 114 (!) 109 (!) 108  Resp:  20 14 17   Temp: 98.3 F (36.8 C)   97.7 F (36.5 C)  TempSrc:    Oral  SpO2:  98%  94%  Weight:      Height:        Intake/Output Summary (Last 24 hours) at 12/10/2021 0759 Last data filed at 12/09/2021 1900 Gross per 24 hour  Intake 1026.29 ml  Output 1400 ml  Net -373.71 ml    Last 3 Weights 12/09/2021 12/06/2021 12/05/2021  Weight (lbs) 171 lb 15.3 oz 155 lb 3.3 oz 147 lb 0.8 oz  Weight (kg) 78 kg 70.4 kg 66.7 kg      Telemetry    Atrial fibrillation rate upper normal to mildly increased - Personally Reviewed  Physical Exam   GEN: NAD, elderly Neck: supple Cardiac: irregular, 2/6 systolic murmur, no rub Respiratory: CTA anteriorly; no wheeze GI: Soft, NT/ND MS: trace ankle edema Neuro:  grossly intact Psych: Normal affect   Labs     Chemistry Recent Labs  Lab 12/08/21 0423 12/09/21 0405 12/10/21 0241  NA 135 133* 132*  K 3.3* 3.2* 4.2  CL 106 106 105  CO2 23 22 22   GLUCOSE 118* 113* 118*  BUN 21 24* 23  CREATININE 0.59* 0.64 0.63   CALCIUM 7.7* 7.6* 7.8*  MG  --  1.9 2.0  PROT 5.2* 5.0* 5.1*  ALBUMIN 2.1* 2.0* 1.9*  AST 47* 44* 49*  ALT 44 39 42  ALKPHOS 53 51 58  BILITOT 1.0 0.9 0.9  GFRNONAA >60 >60 >60  ANIONGAP 6 5 5     Hematology Recent Labs  Lab 12/08/21 0423 12/09/21 0405 12/10/21 0241  WBC 8.4 8.6 9.3  RBC 2.73* 2.85* 2.79*  HGB 9.6* 10.0* 9.5*  HCT 28.6* 29.7* 29.5*  MCV 104.8* 104.2* 105.7*  MCH 35.2* 35.1* 34.1*  MCHC 33.6 33.7 32.2  RDW 15.3 15.4 15.4  PLT 137* 140* 165    Thyroid  Recent Labs  Lab 12/05/21 1930  TSH 2.229      TTE 04/2021: IMPRESSIONS   1. Left ventricular ejection fraction, by estimation, is 60 to 65%. The  left ventricle has normal function. The left ventricle has no regional  wall motion abnormalities. Left ventricular diastolic parameters are  indeterminate.   2. Right ventricular systolic function is normal. The right ventricular  size is mildly enlarged. There is mildly elevated pulmonary artery  systolic pressure.   3. Left atrial size was severely dilated.   4. Right atrial size was mildly dilated.   5. The mitral valve is normal in structure. Mild mitral valve  regurgitation. No evidence of mitral stenosis.   6. Tricuspid valve regurgitation is severe.   7. The aortic valve is tricuspid. There is moderate calcification of the  aortic valve. There is moderate thickening of the aortic valve. Aortic  valve regurgitation is not visualized. Mild to moderate aortic valve  sclerosis/calcification is present,  without any evidence of aortic stenosis.   8. There is mild dilatation of the aortic root, measuring 40 mm.   9. The inferior vena cava is dilated in size with >50% respiratory  variability, suggesting right atrial pressure of 8 mmHg.   Patient Profile     87 y.o. male with history of persistent Afib, prior CVA, hypothyroidism, and mild TAA who presented after a mechanical fall where he was down for 2 days found to have rhabdomyolysis and Afib  with RVR. Cardiology is consulted regarding Afib with RVR.  Assessment & Plan    1 Permanent atrial fibrillation-heart rate upper normalto mildly increased this AM; continue amiodarone 200 mg BID and decrease to 200 mg daily in six days; add digoxin 0.125 mg daily. Will need digoxin level 1 week (follow closely given pt's age and potential amiodarone interaction). Continue apixaban.  Echocardiogram with normal LV function.    2 s/p Fall with rhabdomyolysis-patient appears to be euvolemic at present with some volume excess with lower extremity edema.  Resume lasix 20 mg daily at DC.  3 MR/TR-conservative measures given pt's age and overall medical condition.  For questions or updates, please contact [place] Please consult [email or web address] for contact info under        Signed, Kirk Ruths, MD  12/10/2021, 7:59 AM

## 2021-12-10 NOTE — Progress Notes (Signed)
[facility]  PROGRESS NOTE  DEANGLEO PASSAGE ZSW:[id number] DOB: 1930-08-16 DOA: 12/05/2021 PCP: [name], MD   Brief HPI:   87 year old male who was found on the floor by his son.  Patient has been on floor after a fall on 12/02/2021.  He was too weak to get up to get to the phone.  In the ED he was found to have A. fib with RVR and UTI.  He has past medical history of atrial fibrillation, on Eliquis, CKD stage II, hypothyroidism, mitral and tricuspid regurgitation.  CK was found to be elevated at 1889 with elevation of creatinine.  He was given IV fluids and CK down trended. Cardiology was consulted for the patient's atrial flutter with RVR.  He improved on amiodarone    Subjective   This morning patient started having rectal bleeding and also bleeding from the decubitus ulcer while getting hydrotherapy.   Assessment/Plan:    Rhabdomyolysis/AKI superimposed on CKD stage II\ -Presented on 12/14 after a fall, CK was elevated at 889, BUN 68, creatinine 1.43, bicarb 16 -Started on fluid resuscitation in the ED -Improved with isotonic bicarbonate IVF -CK is down to 143 -He is off IV fluids  Rectal bleeding -Patient is on Eliquis -We will hold Eliquis at this time as patient is having active bleed -He has a history of internal hemorrhoids seen on colonoscopy in 2011 -Repeat hemoglobin this morning was 10.6  Bleeding from decubitus ulcer -Patient developed bleeding from decubitus ulcer while getting hydrotherapy -Bleeding stopped by applying pressure -Eliquis will be on hold as above -Can restart Eliquis after hydrotherapy  Sepsis due to UTI -Presented with tachycardia, hypotension, hypothermia, lactic acidosis, AKI on admission -Improved with IV fluids and antibiotics -Urine culture grew E. Coli -Patient was treated with ceftriaxone -We will continue with ceftriaxone  Right knee pain -Patient has bilateral leg swelling -Likely underlying arthritic changes -X-ray  obtained showed degenerative changes with small joint effusion, no acute fracture -Continue supportive care -Continue as needed Tylenol  Atrial fibrillation with RVR -Presented with A. fib with RVR -Cardiology was consulted, due to hypotension he was started on amiodarone -Currently he is off beta-blockers and on amiodarone 200 mg p.o. twice daily  Mitral and tricuspid regurgitation -Severe TR and mild MR on TTE in May 2022 -Followed by cardiology Lasix on hold  Hypothermia -Likely due to sepsis due to UTI -Resolved  Hypothyroidism -TSH is normal -Continue Synthroid  Hypokalemia -Potassium replaced -Follow BMP in am    Medications     amiodarone  200 mg Oral BID   collagenase   Topical Daily   dapsone  50 mg Oral Daily   digoxin  0.125 mg Oral Daily   feeding supplement  237 mL Oral TID BM   levothyroxine  50 mcg Oral Daily   mouth rinse  15 mL Mouth Rinse BID   polyethylene glycol  17 g Oral Daily   senna-docusate  1 tablet Oral BID   sodium chloride flush  3 mL Intravenous Q12H     Data Reviewed:   CBG:  No results for input(s): GLUCAP in the last 168 hours.  SpO2: 94 %    Vitals:   12/09/21 2045 12/10/21 0259 12/10/21 0746 12/10/21 1300  BP: 104/80 106/77 105/79 104/74  Pulse: (!) 114 (!) 109 (!) 108   Resp: 20 14 17    Temp:   97.7 F (36.5 C) 97.6 F (36.4 C)  TempSrc:   Oral Oral  SpO2: 98%  94%  Weight:      Height:         Intake/Output Summary (Last 24 hours) at 12/10/2021 1509 Last data filed at 12/10/2021 0900 Gross per 24 hour  Intake 906.29 ml  Output 800 ml  Net 106.29 ml    12/20 1901 - 12/22 0700 In: 1026.3 [P.O.:720; I.V.:206.3] Out: 1400 [Urine:1400]  Filed Weights   12/05/21 0306 12/06/21 0514 12/09/21 0500  Weight: 66.7 kg 70.4 kg 78 kg    Data Reviewed: Basic Metabolic Panel: Recent Labs  Lab 12/06/21 0235 12/07/21 0310 12/08/21 0423 12/09/21 0405 12/10/21 0241  NA 138 136 135 133* 132*  K 3.9 3.5 3.3*  3.2* 4.2  CL 105 106 106 106 105  CO2 26 23 23 22 22   GLUCOSE 150* 140* 118* 113* 118*  BUN 48* 27* 21 24* 23  CREATININE 0.93 0.70 0.59* 0.64 0.63  CALCIUM 8.2* 7.8* 7.7* 7.6* 7.8*  MG  --   --   --  1.9 2.0   Liver Function Tests: Recent Labs  Lab 12/06/21 0235 12/07/21 0310 12/08/21 0423 12/09/21 0405 12/10/21 0241  AST 71* 70* 47* 44* 49*  ALT 43 52* 44 39 42  ALKPHOS 54 54 53 51 58  BILITOT 1.1 1.0 1.0 0.9 0.9  PROT 5.5* 5.3* 5.2* 5.0* 5.1*  ALBUMIN 2.5* 2.2* 2.1* 2.0* 1.9*   No results for input(s): LIPASE, AMYLASE in the last 168 hours. No results for input(s): AMMONIA in the last 168 hours. CBC: Recent Labs  Lab 12/05/21 0138 12/06/21 0235 12/07/21 0310 12/08/21 0423 12/09/21 0405 12/10/21 0241 12/10/21 1213  WBC 14.3*   < > 8.2 8.4 8.6 9.3 9.4  NEUTROABS 12.4*  --   --   --  6.4 7.0  --   HGB 11.3*   < > 9.5* 9.6* 10.0* 9.5* 10.6*  HCT 35.2*   < > 28.7* 28.6* 29.7* 29.5* 32.3*  MCV 107.6*   < > 104.7* 104.8* 104.2* 105.7* 106.6*  PLT 194   < > 128* 137* 140* 165 190   < > = values in this interval not displayed.   Cardiac Enzymes: Recent Labs  Lab 12/05/21 0138 12/06/21 0235 12/07/21 0310 12/08/21 0423  CKTOTAL 1,889* 676* 312 143   BNP (last 3 results) No results for input(s): BNP in the last 8760 hours.  ProBNP (last 3 results) No results for input(s): PROBNP in the last 8760 hours.  CBG: No results for input(s): GLUCAP in the last 168 hours.     Radiology Reports  No results found.     Antibiotics: Anti-infectives (From admission, onward)    Start     Dose/Rate Route Frequency Ordered Stop   12/09/21 1830  cefTRIAXone (ROCEPHIN) 1 g in sodium chloride 0.9 % 100 mL IVPB        1 g 200 mL/hr over 30 Minutes Intravenous Every 24 hours 12/09/21 1742     12/06/21 1500  dapsone tablet 50 mg        50 mg Oral Daily 12/06/21 1408     12/05/21 1045  cefTRIAXone (ROCEPHIN) 1 g in sodium chloride 0.9 % 100 mL IVPB  Status:  Discontinued         1 g 200 mL/hr over 30 Minutes Intravenous Every 24 hours 12/05/21 1038 12/07/21 1618         DVT prophylaxis: Apixaban  Code Status: Full code  Family Communication: No family at bedside   Consultants:   Procedures:     Objective  Physical Examination:   General-appears in no acute distress Heart-S1-S2, regular, no murmur auscultated Lungs-clear to auscultation bilaterally, no wheezing or crackles auscultated Abdomen-soft, nontender, no organomegaly Extremities-no edema in the lower extremities Skin-large decubitus ulcer on lower back noted Neuro-alert, oriented x3, no focal deficit noted   Status is: Inpatient  Dispo: The patient is from: Home              Anticipated d/c is to: Skilled nursing facility              Anticipated d/c date is: 12/11/2021              Patient currently stable for discharge  Barrier to discharge-awaiting bed at skilled nursing facility  COVID-19 Labs  No results for input(s): DDIMER, FERRITIN, LDH, CRP in the last 72 hours.  Lab Results  Component Value Date   [place] NEGATIVE 12/09/2021   [place] NEGATIVE 12/05/2021     Pressure Injury 12/05/21 Sacrum Unstageable - Full thickness tissue loss in which the base of the injury is covered by slough (yellow, tan, gray, green or brown) and/or eschar (tan, brown or black) in the wound bed. (Active)  12/05/21 2100  Location: Sacrum  Location Orientation:   Staging: Unstageable - Full thickness tissue loss in which the base of the injury is covered by slough (yellow, tan, gray, green or brown) and/or eschar (tan, brown or black) in the wound bed.  Wound Description (Comments):   Present on Admission: Yes     Pressure Injury 12/06/21 Heel Right Deep Tissue Pressure Injury - Purple or maroon localized area of discolored intact skin or blood-filled blister due to damage of underlying soft tissue from pressure and/or shear. (Active)  12/06/21 1800  Location: Heel   Location Orientation: Right  Staging: Deep Tissue Pressure Injury - Purple or maroon localized area of discolored intact skin or blood-filled blister due to damage of underlying soft tissue from pressure and/or shear.  Wound Description (Comments):   Present on Admission: Yes     Pressure Injury 12/07/21 Hip Left Stage 1 -  Intact skin with non-blanchable redness of a localized area usually over a bony prominence. (Active)  12/07/21   Location: Hip  Location Orientation: Left  Staging: Stage 1 -  Intact skin with non-blanchable redness of a localized area usually over a bony prominence.  Wound Description (Comments):   Present on Admission: Yes     Pressure Injury 12/07/21 Vertebral column Medial Deep Tissue Pressure Injury - Purple or maroon localized area of discolored intact skin or blood-filled blister due to damage of underlying soft tissue from pressure and/or shear. 3 areas of deep tissue pr (Active)  12/07/21   Location: Vertebral column  Location Orientation: Medial  Staging: Deep Tissue Pressure Injury - Purple or maroon localized area of discolored intact skin or blood-filled blister due to damage of underlying soft tissue from pressure and/or shear.  Wound Description (Comments): 3 areas of deep tissue pressure injuries, each approx 2X2cm  Present on Admission: Yes        Recent Results (from the past 240 hour(s))  Urine Culture     Status: Abnormal   Collection Time: 12/05/21  1:38 AM   Specimen: Urine, Clean Catch  Result Value Ref Range Status   Specimen Description URINE, CLEAN CATCH  Final   Special Requests   Final    NONE Performed at [facility], [place] [address], [place], [place] [address]    Culture >=100,000 COLONIES/mL ESCHERICHIA  COLI (A)  Final   Report Status 12/07/2021 FINAL  Final   Organism ID, Bacteria ESCHERICHIA COLI (A)  Final      Susceptibility   Escherichia coli - MIC*    AMPICILLIN >=32 RESISTANT Resistant     CEFAZOLIN 32  INTERMEDIATE Intermediate     CEFEPIME <=0.12 SENSITIVE Sensitive     CEFTRIAXONE <=0.25 SENSITIVE Sensitive     CIPROFLOXACIN <=0.25 SENSITIVE Sensitive     GENTAMICIN <=1 SENSITIVE Sensitive     IMIPENEM <=0.25 SENSITIVE Sensitive     NITROFURANTOIN <=16 SENSITIVE Sensitive     TRIMETH/SULFA <=20 SENSITIVE Sensitive     AMPICILLIN/SULBACTAM >=32 RESISTANT Resistant     PIP/TAZO 32 INTERMEDIATE Intermediate     * >=100,000 COLONIES/mL ESCHERICHIA COLI  Blood culture (routine x 2)     Status: None   Collection Time: 12/05/21  1:45 AM   Specimen: BLOOD  Result Value Ref Range Status   Specimen Description BLOOD RIGHT ARM  Final   Special Requests   Final    BOTTLES DRAWN AEROBIC AND ANAEROBIC Blood Culture results may not be optimal due to an excessive volume of blood received in culture bottles   Culture   Final    NO GROWTH 5 DAYS Performed at [facility], [address], [place], [place] [address]    Report Status 12/10/2021 FINAL  Final  Resp Panel by RT-PCR (Flu A&B, Covid) Nasopharyngeal Swab     Status: None   Collection Time: 12/05/21  2:30 AM   Specimen: Nasopharyngeal Swab; Nasopharyngeal(NP) swabs in vial transport medium  Result Value Ref Range Status   SARS Coronavirus 2 by RT PCR NEGATIVE NEGATIVE Final    Comment: (NOTE) SARS-CoV-2 target nucleic acids are NOT DETECTED.  The SARS-CoV-2 RNA is generally detectable in upper respiratory specimens during the acute phase of infection. The lowest concentration of SARS-CoV-2 viral copies this assay can detect is 138 copies/mL. A negative result does not preclude SARS-Cov-2 infection and should not be used as the sole basis for treatment or other patient management decisions. A negative result may occur with  improper specimen collection/handling, submission of specimen other than nasopharyngeal swab, presence of viral mutation(s) within the areas targeted by this assay, and inadequate number of viral copies(<138  copies/mL). A negative result must be combined with clinical observations, patient history, and epidemiological information. The expected result is Negative.  Fact Sheet for Patients:  EntrepreneurPulse.com.au  Fact Sheet for Healthcare Providers:  IncredibleEmployment.be  This test is no t yet approved or cleared by the [place] FDA and  has been authorized for detection and/or diagnosis of SARS-CoV-2 by FDA under an Emergency Use Authorization (EUA). This EUA will remain  in effect (meaning this test can be used) for the duration of the COVID-19 declaration under Section 564(b)(1) of the Act, 21 U.S.C.section 360bbb-3(b)(1), unless the authorization is terminated  or revoked sooner.       Influenza A by PCR NEGATIVE NEGATIVE Final   Influenza B by PCR NEGATIVE NEGATIVE Final    Comment: (NOTE) The Xpert Xpress SARS-CoV-2/FLU/RSV plus assay is intended as an aid in the diagnosis of influenza from Nasopharyngeal swab specimens and should not be used as a sole basis for treatment. Nasal washings and aspirates are unacceptable for Xpert Xpress SARS-CoV-2/FLU/RSV testing.  Fact Sheet for Patients: EntrepreneurPulse.com.au  Fact Sheet for Healthcare Providers: IncredibleEmployment.be  This test is not yet approved or cleared by the [place] and has been authorized for  detection and/or diagnosis of SARS-CoV-2 by FDA under an Emergency Use Authorization (EUA). This EUA will remain in effect (meaning this test can be used) for the duration of the COVID-19 declaration under Section 564(b)(1) of the Act, 21 U.S.C. section 360bbb-3(b)(1), unless the authorization is terminated or revoked.  Performed at [facility], [place] [address], [place], [place] [address]   Blood culture (routine x 2)     Status: None   Collection Time: 12/05/21  8:35 AM   Specimen: BLOOD LEFT FOREARM  Result Value Ref  Range Status   Specimen Description BLOOD LEFT FOREARM  Final   Special Requests   Final    BOTTLES DRAWN AEROBIC AND ANAEROBIC Blood Culture results may not be optimal due to an inadequate volume of blood received in culture bottles   Culture   Final    NO GROWTH 5 DAYS Performed at [facility], [place] [address], [place], [place] [address]    Report Status 12/10/2021 FINAL  Final  MRSA Next Gen by PCR, Nasal     Status: None   Collection Time: 12/07/21  7:54 PM   Specimen: Nasal Mucosa; Nasal Swab  Result Value Ref Range Status   MRSA by PCR Next Gen NOT DETECTED NOT DETECTED Final    Comment: (NOTE) The GeneXpert MRSA Assay (FDA approved for NASAL specimens only), is one component of a comprehensive MRSA colonization surveillance program. It is not intended to diagnose MRSA infection nor to guide or monitor treatment for MRSA infections. Test performance is not FDA approved in patients less than 32 years old. Performed at [facility], [place] [address] [place], [place] [address]   Resp Panel by RT-PCR (Flu A&B, Covid) Nasopharyngeal Swab     Status: None   Collection Time: 12/09/21 10:39 AM   Specimen: Nasopharyngeal Swab; Nasopharyngeal(NP) swabs in vial transport medium  Result Value Ref Range Status   SARS Coronavirus 2 by RT PCR NEGATIVE NEGATIVE Final    Comment: (NOTE) SARS-CoV-2 target nucleic acids are NOT DETECTED.  The SARS-CoV-2 RNA is generally detectable in upper respiratory specimens during the acute phase of infection. The lowest concentration of SARS-CoV-2 viral copies this assay can detect is 138 copies/mL. A negative result does not preclude SARS-Cov-2 infection and should not be used as the sole basis for treatment or other patient management decisions. A negative result may occur with  improper specimen collection/handling, submission of specimen other than nasopharyngeal swab, presence of viral mutation(s) within the areas targeted  by this assay, and inadequate number of viral copies(<138 copies/mL). A negative result must be combined with clinical observations, patient history, and epidemiological information. The expected result is Negative.  Fact Sheet for Patients:  EntrepreneurPulse.com.au  Fact Sheet for Healthcare Providers:  IncredibleEmployment.be  This test is no t yet approved or cleared by the [place] FDA and  has been authorized for detection and/or diagnosis of SARS-CoV-2 by FDA under an Emergency Use Authorization (EUA). This EUA will remain  in effect (meaning this test can be used) for the duration of the COVID-19 declaration under Section 564(b)(1) of the Act, 21 U.S.C.section 360bbb-3(b)(1), unless the authorization is terminated  or revoked sooner.       Influenza A by PCR NEGATIVE NEGATIVE Final   Influenza B by PCR NEGATIVE NEGATIVE Final    Comment: (NOTE) The Xpert Xpress SARS-CoV-2/FLU/RSV plus assay is intended as an aid in the diagnosis of influenza from Nasopharyngeal swab specimens and should not be  used as a sole basis for treatment. Nasal washings and aspirates are unacceptable for Xpert Xpress SARS-CoV-2/FLU/RSV testing.  Fact Sheet for Patients: EntrepreneurPulse.com.au  Fact Sheet for Healthcare Providers: IncredibleEmployment.be  This test is not yet approved or cleared by the [place] FDA and has been authorized for detection and/or diagnosis of SARS-CoV-2 by FDA under an Emergency Use Authorization (EUA). This EUA will remain in effect (meaning this test can be used) for the duration of the COVID-19 declaration under Section 564(b)(1) of the Act, 21 U.S.C. section 360bbb-3(b)(1), unless the authorization is terminated or revoked.  Performed at [facility], [place] [address] [place], [place] [address]     [facility] If 7PM-7AM, please contact  night-coverage at [email or web address], Office  [phone]   12/10/2021, 3:09 PM  LOS: 5 days

## 2021-12-10 NOTE — Progress Notes (Signed)
Physical Therapy Wound Treatment Patient Details  Name: [name] MRN: [id number] Date of Birth: 05-01-1930  Today's Date: 12/10/2021 Time: 1115-1200 Time Calculation (min): 45 min  Subjective  Subjective Assessment Subjective: Pt agreeable to hydrotherapy treatment; pt son present initially Patient and Family Stated Goals: for wounds to heal Date of Onset:  (unknown) Prior Treatments:  (dressing change)  Pain Score:  2/10 premedicated  Wound Assessment  Pressure Injury 12/05/21 Sacrum Unstageable - Full thickness tissue loss in which the base of the injury is covered by slough (yellow, tan, gray, green or brown) and/or eschar (tan, brown or black) in the wound bed. (Active)  Wound Image   12/08/21 1109  Dressing Type Barrier Film (skin prep);Gauze (Comment);ABD;Moist to dry;Santyl;Normal saline moist dressing 12/10/21 1307  Dressing Clean;Dry;Intact 12/10/21 1307  Dressing Change Frequency Daily 12/10/21 1307  State of Healing Eschar 12/10/21 1307  Site / Wound Assessment Brown;Red;Yellow 12/10/21 1307  % Wound base Red or Granulating 5% 12/10/21 1307  % Wound base Yellow/Fibrinous Exudate 85% 12/10/21 1307  % Wound base Black/Eschar 10% 12/10/21 1307  % Wound base Other/Granulation Tissue (Comment) 0% 12/10/21 1307  Peri-wound Assessment Erythema (blanchable);Erythema (non-blanchable) 12/10/21 1307  Wound Length (cm) 6 cm 12/08/21 1109  Wound Width (cm) 7 cm 12/08/21 1109  Wound Depth (cm) 0.1 cm 12/08/21 1109  Wound Surface Area (cm^2) 42 cm^2 12/08/21 1109  Wound Volume (cm^3) 4.2 cm^3 12/08/21 1109  Margins Unattached edges (unapproximated) 12/10/21 1307  Drainage Amount Moderate 12/10/21 1307  Drainage Description Serous;Sanguineous 12/10/21 1307  Treatment Cleansed;Debridement (Selective);Hydrotherapy (Pulse lavage);Packing (Saline gauze) 12/10/21 1307      Hydrotherapy Pulsed lavage therapy - wound location: sacrum Pulsed Lavage with Suction (psi): 12 psi  (8-12) Pulsed Lavage with Suction - Normal Saline Used: 1000 mL Pulsed Lavage Tip: Tip with splash shield Selective Debridement Selective Debridement - Location: sacrum Selective Debridement - Tools Used: Forceps, Scalpel, Scissors Selective Debridement - Tissue Removed: eschar    Wound Assessment and Plan  Wound Therapy - Assess/Plan/Recommendations Wound Therapy - Clinical Statement: Pt presents to hydrotherapy with unstageable sacral wound. Will benefit from pulsatile lavage and selective debridement to remove necrotic tissue and promote healing. Wound Therapy - Functional Problem List: decreased mobility Factors Delaying/Impairing Wound Healing: Immobility Hydrotherapy Plan: Debridement, Dressing change, Patient/family education, Pulsatile lavage with suction Wound Therapy - Frequency: 6X / week Wound Therapy - Follow Up Recommendations: dressing changes by RN  Wound Therapy Goals- Improve the function of patient's integumentary system by progressing the wound(s) through the phases of wound healing (inflammation - proliferation - remodeling) by: Wound Therapy Goals - Improve the function of patient's integumentary system by progressing the wound(s) through the phases of wound healing by: Decrease Necrotic Tissue to: 50 Decrease Necrotic Tissue - Progress: Progressing toward goal Increase Granulation Tissue to: 50 Increase Granulation Tissue - Progress: Progressing toward goal Goals/treatment plan/discharge plan were made with and agreed upon by patient/family: Yes Time For Goal Achievement: 7 days Wound Therapy - Potential for Goals: Fair  Goals will be updated until maximal potential achieved or discharge criteria met.  Discharge criteria: when goals achieved, discharge from hospital, MD decision/surgical intervention, no progress towards goals, refusal/missing three consecutive treatments without notification or medical reason.  GP     Charges PT Wound Care Charges $Wound  Debridement up to 20 cm: < or equal to 20 cm $ Wound Debridement each add'l 20 sqcm: 2 $PT PLS Gun and Tip: 1 Supply $PT Hydrotherapy Visit: 1 Visit  [name] 12/10/2021, 1:10 PM 12/10/2021  [name]., PT Acute Rehabilitation Services [phone]  (pager) [phone]  (office)

## 2021-12-10 NOTE — Progress Notes (Signed)
Occupational Therapy Treatment Patient Details Name: [name] MRN: [id number] DOB: 03/14/1930 Today's Date: 12/10/2021   History of present illness 87 y.o. male presents to [facility] hospital on 12/05/2021 after experiencing a fall at home on 12/14. Pt was down for multiple before his son was able to find him. PT admitted for rhabdomyolysis, afib with RVR. PMH includes atrial fibrillation on Eliquis, CKD stage II, hypothyroidism, mitral and tricuspid cuspid regurgitation.   OT comments  Pt progressing towards established OT goals. Pt performing sit<>stand with stedy x3; requiring Min A for power up from elevated surface and then Max A for power up from very low BSC. Total A for peri care. Pt transferring to recliner for breakfast and encouraging pt to perform self feeding as much as he can to increase BUE strength. Placing blue geo-mat in recliner, plan for back to bed in one hour with PT, and notified RN of small wound on L upper back (below scapula). HR 130-140s; max 146. Despite fatigue, pt very motivated to participate in therapy. Continue to recommend dc to SNF and will continue to follow acutely as admitted.    Recommendations for follow up therapy are one component of a multi-disciplinary discharge planning process, led by the attending physician.  Recommendations may be updated based on patient status, additional functional criteria and insurance authorization.    Follow Up Recommendations  Skilled nursing-short term rehab (<3 hours/day)    Assistance Recommended at Discharge Frequent or constant Supervision/Assistance  Equipment Recommendations  BSC/3in1    Recommendations for Other Services PT consult    Precautions / Restrictions Precautions Precautions: Fall Precaution Comments: afib RVR       Mobility Bed Mobility Overal bed mobility: Needs Assistance Bed Mobility: Supine to Sit     Supine to sit: Max assist     General bed mobility comments: Max A for bring BLEs  towards EOB and then elevating trunk    Transfers Overall transfer level: Needs assistance Equipment used: Ambulation equipment used [employer]) Transfers: Sit to/from Stand Sit to Stand: Min assist;Max assist           General transfer comment: Min A for power up form elevated surface. Max A for BSC as it is low surface     Balance Overall balance assessment: Needs assistance Sitting-balance support: Bilateral upper extremity supported;Feet supported Sitting balance-Leahy Scale: Fair Sitting balance - Comments: minG   Standing balance support: Bilateral upper extremity supported;During functional activity Standing balance-Leahy Scale: Poor Standing balance comment: reliant on UE support and physical A                           ADL either performed or assessed with clinical judgement   ADL Overall ADL's : Needs assistance/impaired   Eating/Feeding Details (indicate cue type and reason): Encouraging pt to eat breakfast himself instead of having son feed him - though if he fatigues, he may need assistance                     Toilet Transfer: Total assistance;+2 for physical assistance Toilet Transfer Details (indicate cue type and reason): Total A with use of stedy to get to [facility]. Min A for power up form elevated surface. Max A for BSC as it is low surface Toileting- Clothing Manipulation and Hygiene: Total assistance Toileting - Clothing Manipulation Details (indicate cue type and reason): Total A for peri care after BM     Functional mobility during ADLs:  Minimal assistance;Maximal assistance (sit<>stand with stedy) General ADL Comments: Pt performing sit<>stand with stedy x 3. Performing toileting and then transfer to recliner for breakfast    Extremity/Trunk Assessment Upper Extremity Assessment Upper Extremity Assessment: RUE deficits/detail;LUE deficits/detail RUE Deficits / Details: Limited strength and ROM. Difficulty raising RUE to reach for stedy  bar. Noting edema RUE Coordination: decreased fine motor;decreased gross motor LUE Deficits / Details: Limited strength and AROM.   Lower Extremity Assessment Lower Extremity Assessment: Defer to PT evaluation        Vision       Perception     Praxis      Cognition Arousal/Alertness: Awake/alert Behavior During Therapy: WFL for tasks assessed/performed Overall Cognitive Status: Within Functional Limits for tasks assessed                                 General Comments: Very motivated and able to problem solve compensatory techniques          Exercises     Shoulder Instructions       General Comments HR elevating to 146 max. Son present throughout.    Pertinent Vitals/ Pain       Pain Assessment: Faces Faces Pain Scale: Hurts even more Pain Location: BLE Pain Descriptors / Indicators: Sore Pain Intervention(s): Monitored during session;Limited activity within patient's tolerance;Repositioned  Home Living                                          Prior Functioning/Environment              Frequency  Min 2X/week        Progress Toward Goals  OT Goals(current goals can now be found in the care plan section)  Progress towards OT goals: Progressing toward goals  Acute Rehab OT Goals OT Goal Formulation: With patient/family Time For Goal Achievement: 12/22/21 Potential to Achieve Goals: Good ADL Goals Pt Will Perform Upper Body Dressing: with min assist;sitting Pt Will Perform Lower Body Dressing: with mod assist;with adaptive equipment;sit to/from stand Pt Will Transfer to Toilet: with min assist;bedside commode;stand pivot transfer Pt Will Perform Toileting - Clothing Manipulation and hygiene: with min assist;sit to/from stand;sitting/lateral leans Additional ADL Goal #1: Pt will perform bed mobility with Min A in preparation for ADLs  Plan Discharge plan remains appropriate    Co-evaluation                  AM-PAC OT "6 Clicks" Daily Activity     Outcome Measure   Help from another person eating meals?: A Little Help from another person taking care of personal grooming?: A Little Help from another person toileting, which includes using toliet, bedpan, or urinal?: A Lot Help from another person bathing (including washing, rinsing, drying)?: A Lot Help from another person to put on and taking off regular upper body clothing?: A Lot Help from another person to put on and taking off regular lower body clothing?: A Lot 6 Click Score: 14    End of Session Equipment Utilized During Treatment: Gait belt;Other (comment) (stedy)  OT Visit Diagnosis: Unsteadiness on feet (R26.81);Other abnormalities of gait and mobility (R26.89);Muscle weakness (generalized) (M62.81);History of falling (Z91.81);Pain Pain - Right/Left: Right Pain - part of body: Knee   Activity Tolerance Patient tolerated treatment well   Patient Left with  call bell/phone within reach;in chair;with chair alarm set;with family/visitor present;with nursing/sitter in room   Nurse Communication Mobility status;Other (comment);Need for lift equipment (Small wound at L back)        Time: 0301-3143 OT Time Calculation (min): 38 min  Charges: OT General Charges $OT Visit: 1 Visit OT Treatments $Self Care/Home Management : 38-52 mins  [place], OTR/L Acute Rehab Pager: [phone] Office: [place] 12/10/2021, 10:19 AM

## 2021-12-10 NOTE — Progress Notes (Signed)
Patient transferred to air mattress.

## 2021-12-10 NOTE — TOC Progression Note (Signed)
Transition of Care [facility]) - Progression Note    Patient Details  Name: [name] MRN: [id number] Date of Birth: 1930-02-16  Transition of Care [facility]) CM/SW Contact  Joanne Chars, LCSW Phone Number: 12/10/2021, 12:54 PM  Clinical Narrative:   Per MD, no DC today.  Claiborne Billings at [place] and pt son Merry Proud both informed.      Expected Discharge Plan: [place] Barriers to Discharge: Continued Medical Work up  Expected Discharge Plan and Services Expected Discharge Plan: [place] In-house Referral: Clinical Social Work     Living arrangements for the past 2 months: Single Family Home                                       Social Determinants of Health (SDOH) Interventions    Readmission Risk Interventions No flowsheet data found.

## 2021-12-10 NOTE — Progress Notes (Signed)
Physical Therapy Treatment Patient Details Name: [name] MRN: [id number] DOB: 06-Nov-1930 Today's Date: 12/10/2021   History of Present Illness 87 y.o. male presents to [facility] hospital on 12/05/2021 after experiencing a fall at home on 12/14. Pt was down for multiple before his son was able to find him. PT admitted for rhabdomyolysis, afib with RVR. PMH includes atrial fibrillation on Eliquis, CKD stage II, hypothyroidism, mitral and tricuspid cuspid regurgitation.    PT Comments    Pt admitted with above diagnosis. Pt was able to tolerate standing x 4 however cannot tolerate for longer than about 15 seconds and needs constant cues to stand tall.  Pt with flexed trunk and knees and pts posture worsens as he fatigues.  Notified nurse of blood in stool.  Overall tolerated stands well.  Pt currently with functional limitations due to balance and endurance deficits. Pt will benefit from skilled PT to increase their independence and safety with mobility to allow discharge to the venue listed below.      Recommendations for follow up therapy are one component of a multi-disciplinary discharge planning process, led by the attending physician.  Recommendations may be updated based on patient status, additional functional criteria and insurance authorization.  Follow Up Recommendations  Skilled nursing-short term rehab (<3 hours/day)     Assistance Recommended at Discharge Intermittent Supervision/Assistance  Equipment Recommendations  Wheelchair (measurements PT);Wheelchair cushion (measurements PT);Hospital bed    Recommendations for Other Services       Precautions / Restrictions Precautions Precautions: Fall Precaution Comments: afib RVR Restrictions Weight Bearing Restrictions: No     Mobility  Bed Mobility Overal bed mobility: Needs Assistance Bed Mobility: Supine to Sit     Supine to sit: Max assist Sit to supine: Total assist;+2 for physical assistance   General bed  mobility comments: total assist to lie back in bed and position ofr hydro.    Transfers Overall transfer level: Needs assistance Equipment used: Ambulation equipment used [name]) Transfers: Sit to/from Stand Sit to Stand: Min assist;Max assist           General transfer comment: Min A for power up form elevated surface. Max A of 2 from lower surface.  Pt stood from the recliner to the [place]. Then moved him to the 3N1. He had BM that was bloody. Notified nurse. Then Moved pt to bed as hydro was coming to do wound care.    Ambulation/Gait                   Stairs             Wheelchair Mobility    Modified Rankin (Stroke Patients Only)       Balance Overall balance assessment: Needs assistance Sitting-balance support: Bilateral upper extremity supported;Feet supported Sitting balance-Leahy Scale: Fair Sitting balance - Comments: minG Postural control: Posterior lean Standing balance support: Bilateral upper extremity supported;During functional activity Standing balance-Leahy Scale: Poor Standing balance comment: reliant on UE support and physical A                            Cognition Arousal/Alertness: Awake/alert Behavior During Therapy: WFL for tasks assessed/performed Overall Cognitive Status: Within Functional Limits for tasks assessed                                 General Comments: Very motivated and able to problem solve compensatory  techniques        Exercises General Exercises - Lower Extremity Long Arc Quad: AROM;Both;5 reps;Seated (right weaker than left)    General Comments General comments (skin integrity, edema, etc.): HR up to 152 bpm with activity. other VSS. Son present      Pertinent Vitals/Pain Pain Assessment: Faces Faces Pain Scale: Hurts even more Pain Location: BLE Pain Descriptors / Indicators: Sore Pain Intervention(s): Limited activity within patient's tolerance;Monitored during  session;Repositioned    Home Living                          Prior Function            PT Goals (current goals can now be found in the care plan section) Acute Rehab PT Goals Patient Stated Goal: to reduce pain and improve mobility quality Progress towards PT goals: Progressing toward goals    Frequency    Min 2X/week      PT Plan Current plan remains appropriate    Co-evaluation              AM-PAC PT "6 Clicks" Mobility   Outcome Measure  Help needed turning from your back to your side while in a flat bed without using bedrails?: Total Help needed moving from lying on your back to sitting on the side of a flat bed without using bedrails?: Total Help needed moving to and from a bed to a chair (including a wheelchair)?: Total Help needed standing up from a chair using your arms (e.g., wheelchair or bedside chair)?: Total Help needed to walk in hospital room?: Total Help needed climbing 3-5 steps with a railing? : Total 6 Click Score: 6    End of Session Equipment Utilized During Treatment: Gait belt Activity Tolerance: Patient limited by pain;Patient limited by fatigue Patient left: in bed;with call bell/phone within reach;with bed alarm set;with family/visitor present (hydro therapist present) Nurse Communication: Mobility status;Need for lift equipment PT Visit Diagnosis: Other abnormalities of gait and mobility (R26.89);Muscle weakness (generalized) (M62.81);Pain Pain - part of body:  (back and BLE)     Time: 3267-1245 PT Time Calculation (min) (ACUTE ONLY): 30 min  Charges:  $Therapeutic Activity: 23-37 mins                     [name] M,PT Acute Rehab Services [phone] (pager)    [name] 12/10/2021, 1:50 PM

## 2021-12-10 NOTE — Plan of Care (Signed)
°  Problem: Coping: Goal: Level of anxiety will decrease Outcome: Progressing   Problem: Elimination: Goal: Will not experience complications related to bowel motility Outcome: Progressing Goal: Will not experience complications related to urinary retention Outcome: Progressing   Problem: Pain Managment: Goal: General experience of comfort will improve Outcome: Progressing   Problem: Safety: Goal: Ability to remain free from injury will improve Outcome: Progressing

## 2021-12-11 DIAGNOSIS — I4891 Unspecified atrial fibrillation: Secondary | ICD-10-CM | POA: Diagnosis not present

## 2021-12-11 LAB — COMPREHENSIVE METABOLIC PANEL
ALT: 37 U/L (ref 0–44)
AST: 41 U/L (ref 15–41)
Albumin: 2 g/dL — ABNORMAL LOW (ref 3.5–5.0)
Alkaline Phosphatase: 64 U/L (ref 38–126)
Anion gap: 4 — ABNORMAL LOW (ref 5–15)
BUN: 19 mg/dL (ref 8–23)
CO2: 22 mmol/L (ref 22–32)
Calcium: 8 mg/dL — ABNORMAL LOW (ref 8.9–10.3)
Chloride: 107 mmol/L (ref 98–111)
Creatinine, Ser: 0.65 mg/dL (ref 0.61–1.24)
GFR, Estimated: >60 mL/min (ref >60)
Glucose, Bld: 89 mg/dL (ref 70–99)
Potassium: 4.3 mmol/L (ref 3.5–5.1)
Sodium: 133 mmol/L — ABNORMAL LOW (ref 135–145)
Total Bilirubin: 0.7 mg/dL (ref 0.3–1.2)
Total Protein: 5 g/dL — ABNORMAL LOW (ref 6.5–8.1)

## 2021-12-11 LAB — CBC WITH DIFFERENTIAL/PLATELET
Abs Immature Granulocytes: 0.1 10*3/uL — ABNORMAL HIGH (ref 0.00–0.07)
Basophils Absolute: 0 10*3/uL (ref 0.0–0.1)
Basophils Relative: 0 %
Eosinophils Absolute: 0.2 10*3/uL (ref 0.0–0.5)
Eosinophils Relative: 2 %
HCT: 28.1 % — ABNORMAL LOW (ref 39.0–52.0)
Hemoglobin: 9.3 g/dL — ABNORMAL LOW (ref 13.0–17.0)
Immature Granulocytes: 1 %
Lymphocytes Relative: 20 %
Lymphs Abs: 1.6 10*3/uL (ref 0.7–4.0)
MCH: 34.8 pg — ABNORMAL HIGH (ref 26.0–34.0)
MCHC: 33.1 g/dL (ref 30.0–36.0)
MCV: 105.2 fL — ABNORMAL HIGH (ref 80.0–100.0)
Monocytes Absolute: 0.7 10*3/uL (ref 0.1–1.0)
Monocytes Relative: 8 %
Neutro Abs: 5.3 10*3/uL (ref 1.7–7.7)
Neutrophils Relative %: 69 %
Platelets: 189 10*3/uL (ref 150–400)
RBC: 2.67 MIL/uL — ABNORMAL LOW (ref 4.22–5.81)
RDW: 15.4 % (ref 11.5–15.5)
WBC: 7.9 10*3/uL (ref 4.0–10.5)
nRBC: 0 % (ref 0.0–0.2)

## 2021-12-11 LAB — MAGNESIUM: Magnesium: 2 mg/dL (ref 1.7–2.4)

## 2021-12-11 LAB — GLUCOSE, CAPILLARY: Glucose-Capillary: 90 mg/dL (ref 70–99)

## 2021-12-11 MED ORDER — FUROSEMIDE 10 MG/ML IJ SOLN
20.0000 mg | Freq: Once | INTRAMUSCULAR | Status: AC
  Administered 2021-12-11: 10:00:00 20 mg via INTRAVENOUS
  Filled 2021-12-11: qty 2

## 2021-12-11 NOTE — Progress Notes (Signed)
Ok to add stop date of ceftriaxone for a total of 7d per Dr. Darrick Meigs.  Onnie Boer, PharmD, BCIDP, AAHIVP, CPP Infectious Disease Pharmacist 12/11/2021 8:55 AM

## 2021-12-11 NOTE — Care Management Important Message (Signed)
Important Message  Patient Details  Name: [name] MRN: [id number] Date of Birth: 17-Jul-1930   Medicare Important Message Given:  Yes     Hannah Beat 12/11/2021, 12:47 PM

## 2021-12-11 NOTE — Progress Notes (Signed)
[facility]  PROGRESS NOTE  [name] QHU:[id number] DOB: 10/08/1930 DOA: 12/05/2021 PCP: [name], MD   Brief HPI:   87 year old male who was found on the floor by his son.  Patient has been on floor after a fall on 12/02/2021.  He was too weak to get up to get to the phone.  In the ED he was found to have A. fib with RVR and UTI.  He has past medical history of atrial fibrillation, on Eliquis, CKD stage II, hypothyroidism, mitral and tricuspid regurgitation.  CK was found to be elevated at 1889 with elevation of creatinine.  He was given IV fluids and CK down trended. Cardiology was consulted for the patient's atrial flutter with RVR.  He improved on amiodarone    Subjective   Patient seen and examined, denies any complaints.  No more episodes of rectal bleeding.  Hydrotherapy of decubitus ulcer done today, still black but no difficulty controlling bleeding.  Eliquis has been stopped due to bleeding with  hydrotherapy.   Assessment/Plan:    Rhabdomyolysis/AKI superimposed on CKD stage II\ -Presented on 12/14 after a fall, CK was elevated at 889, BUN 68, creatinine 1.43, bicarb 16 -Started on fluid resuscitation in the ED -Improved with isotonic bicarbonate IVF -CK is down to 143 -He is off IV fluids  Rectal bleeding -Patient is on Eliquis -Eliquis was held as patient was having rectal bleeding  -No more bleeding since Eliquis stopped yesterday  -He has a history of internal hemorrhoids seen on colonoscopy in 2011 -Repeat hemoglobin this morning was 9.3 -Consider restarting Eliquis on Sunday after patient completes hydrotherapy  Bleeding from decubitus ulcer -Patient developed bleeding from decubitus ulcer while getting hydrotherapy -Bleeding stopped by applying pressure -Eliquis will be on hold as above -Can restart Eliquis on Sunday after hydrotherapy  Sepsis due to UTI -Presented with tachycardia, hypotension, hypothermia, lactic acidosis, AKI on  admission -Improved with IV fluids and antibiotics -Urine culture grew E. Coli -Patient was treated with ceftriaxone -Today will be last day of IV ceftriaxone  Right knee pain -Patient has bilateral leg swelling -Likely underlying arthritic changes -X-ray obtained showed degenerative changes with small joint effusion, no acute fracture -Continue supportive care -Continue as needed Tylenol  Atrial fibrillation with RVR -Presented with A. fib with RVR -Cardiology was consulted, due to hypotension he was started on amiodarone -Currently he is off beta-blockers and on amiodarone 200 mg p.o. twice daily -Cardiology recommends to Continue amiodarone 200 mg twice daily for another 5 days then decrease to 200 mg daily thereafter.  Continue digoxin.  Will need level drawn in approximately 1 to 2 weeks. -Anticoagulation on hold as above  Mitral and tricuspid regurgitation -Severe TR and mild MR on TTE in May 2022 -Followed by cardiology Lasix on hold  Hypothermia -Likely due to sepsis due to UTI -Resolved  Hypothyroidism -TSH is normal -Continue Synthroid  Hypokalemia -Replete    Medications     amiodarone  200 mg Oral BID   collagenase   Topical Daily   dapsone  50 mg Oral Daily   digoxin  0.125 mg Oral Daily   feeding supplement  237 mL Oral TID BM   levothyroxine  50 mcg Oral Daily   mouth rinse  15 mL Mouth Rinse BID   polyethylene glycol  17 g Oral Daily   senna-docusate  1 tablet Oral BID   sodium chloride flush  3 mL Intravenous Q12H     Data Reviewed:  CBG:  Recent Labs  Lab 12/11/21 0844  GLUCAP 90    SpO2: 94 %    Vitals:   12/10/21 2028 12/10/21 2056 12/11/21 0511 12/11/21 0700  BP: 100/73  107/80 110/72  Pulse: 82  (!) 104   Resp: 19  18   Temp:  98.5 F (36.9 C) 98.5 F (36.9 C) 97.6 F (36.4 C)  TempSrc:  Oral Oral Oral  SpO2:      Weight:      Height:         Intake/Output Summary (Last 24 hours) at 12/11/2021 1509 Last data  filed at 12/11/2021 5974 Gross per 24 hour  Intake --  Output 800 ml  Net -800 ml    12/21 1901 - 12/23 0700 In: 240 [P.O.:240] Out: 800 [Urine:800]  Filed Weights   12/05/21 0306 12/06/21 0514 12/09/21 0500  Weight: 66.7 kg 70.4 kg 78 kg    Data Reviewed: Basic Metabolic Panel: Recent Labs  Lab 12/07/21 0310 12/08/21 0423 12/09/21 0405 12/10/21 0241 12/11/21 0248  NA 136 135 133* 132* 133*  K 3.5 3.3* 3.2* 4.2 4.3  CL 106 106 106 105 107  CO2 23 23 22 22 22   GLUCOSE 140* 118* 113* 118* 89  BUN 27* 21 24* 23 19  CREATININE 0.70 0.59* 0.64 0.63 0.65  CALCIUM 7.8* 7.7* 7.6* 7.8* 8.0*  MG  --   --  1.9 2.0 2.0   Liver Function Tests: Recent Labs  Lab 12/07/21 0310 12/08/21 0423 12/09/21 0405 12/10/21 0241 12/11/21 0248  AST 70* 47* 44* 49* 41  ALT 52* 44 39 42 37  ALKPHOS 54 53 51 58 64  BILITOT 1.0 1.0 0.9 0.9 0.7  PROT 5.3* 5.2* 5.0* 5.1* 5.0*  ALBUMIN 2.2* 2.1* 2.0* 1.9* 2.0*   No results for input(s): LIPASE, AMYLASE in the last 168 hours. No results for input(s): AMMONIA in the last 168 hours. CBC: Recent Labs  Lab 12/05/21 0138 12/06/21 0235 12/08/21 0423 12/09/21 0405 12/10/21 0241 12/10/21 1213 12/11/21 0248  WBC 14.3*   < > 8.4 8.6 9.3 9.4 7.9  NEUTROABS 12.4*  --   --  6.4 7.0  --  5.3  HGB 11.3*   < > 9.6* 10.0* 9.5* 10.6* 9.3*  HCT 35.2*   < > 28.6* 29.7* 29.5* 32.3* 28.1*  MCV 107.6*   < > 104.8* 104.2* 105.7* 106.6* 105.2*  PLT 194   < > 137* 140* 165 190 189   < > = values in this interval not displayed.   Cardiac Enzymes: Recent Labs  Lab 12/05/21 0138 12/06/21 0235 12/07/21 0310 12/08/21 0423  CKTOTAL 1,889* 676* 312 143   BNP (last 3 results) No results for input(s): BNP in the last 8760 hours.  ProBNP (last 3 results) No results for input(s): PROBNP in the last 8760 hours.  CBG: Recent Labs  Lab 12/11/21 0844  GLUCAP 90       Radiology Reports  No results found.     Antibiotics: Anti-infectives (From  admission, onward)    Start     Dose/Rate Route Frequency Ordered Stop   12/09/21 1830  cefTRIAXone (ROCEPHIN) 1 g in sodium chloride 0.9 % 100 mL IVPB        1 g 200 mL/hr over 30 Minutes Intravenous Every 24 hours 12/09/21 1742 12/11/21 2359   12/06/21 1500  dapsone tablet 50 mg        50 mg Oral Daily 12/06/21 1408     12/05/21 1045  cefTRIAXone (ROCEPHIN) 1 g in sodium chloride 0.9 % 100 mL IVPB  Status:  Discontinued        1 g 200 mL/hr over 30 Minutes Intravenous Every 24 hours 12/05/21 1038 12/07/21 1618         DVT prophylaxis: Apixaban  Code Status: Full code  Family Communication: No family at bedside   Consultants:   Procedures:     Objective    Physical Examination:   General-appears in no acute distress Heart-S1-S2, regular, no murmur auscultated Lungs-clear to auscultation bilaterally, no wheezing or crackles auscultated Abdomen-soft, nontender, no organomegaly Extremities-no edema in the lower extremities Neuro-alert, oriented x3, no focal deficit noted   Status is: Inpatient  Dispo: The patient is from: Home              Anticipated d/c is to: Skilled nursing facility              Anticipated d/c date is: 12/14/2021              Patient currently stable for discharge  Barrier to discharge-awaiting bed at skilled nursing facility  COVID-19 Labs  No results for input(s): DDIMER, FERRITIN, LDH, CRP in the last 72 hours.  Lab Results  Component Value Date   [place] NEGATIVE 12/09/2021   [place] NEGATIVE 12/05/2021     Pressure Injury 12/05/21 Sacrum Unstageable - Full thickness tissue loss in which the base of the injury is covered by slough (yellow, tan, gray, green or brown) and/or eschar (tan, brown or black) in the wound bed. (Active)  12/05/21 2100  Location: Sacrum  Location Orientation:   Staging: Unstageable - Full thickness tissue loss in which the base of the injury is covered by slough (yellow, tan, gray, green or  brown) and/or eschar (tan, brown or black) in the wound bed.  Wound Description (Comments):   Present on Admission: Yes     Pressure Injury 12/06/21 Heel Right Deep Tissue Pressure Injury - Purple or maroon localized area of discolored intact skin or blood-filled blister due to damage of underlying soft tissue from pressure and/or shear. (Active)  12/06/21 1800  Location: Heel  Location Orientation: Right  Staging: Deep Tissue Pressure Injury - Purple or maroon localized area of discolored intact skin or blood-filled blister due to damage of underlying soft tissue from pressure and/or shear.  Wound Description (Comments):   Present on Admission: Yes     Pressure Injury 12/07/21 Hip Left Stage 1 -  Intact skin with non-blanchable redness of a localized area usually over a bony prominence. (Active)  12/07/21   Location: Hip  Location Orientation: Left  Staging: Stage 1 -  Intact skin with non-blanchable redness of a localized area usually over a bony prominence.  Wound Description (Comments):   Present on Admission: Yes     Pressure Injury 12/07/21 Vertebral column Medial Deep Tissue Pressure Injury - Purple or maroon localized area of discolored intact skin or blood-filled blister due to damage of underlying soft tissue from pressure and/or shear. 3 areas of deep tissue pr (Active)  12/07/21   Location: Vertebral column  Location Orientation: Medial  Staging: Deep Tissue Pressure Injury - Purple or maroon localized area of discolored intact skin or blood-filled blister due to damage of underlying soft tissue from pressure and/or shear.  Wound Description (Comments): 3 areas of deep tissue pressure injuries, each approx 2X2cm  Present on Admission: Yes        Recent Results (from the past 240 hour(s))  Urine Culture     Status: Abnormal   Collection Time: 12/05/21  1:38 AM   Specimen: Urine, Clean Catch  Result Value Ref Range Status   Specimen Description URINE, CLEAN CATCH   Final   Special Requests   Final    NONE Performed at [facility], [address] [place], [place] [address]    Culture >=100,000 COLONIES/mL ESCHERICHIA COLI (A)  Final   Report Status 12/07/2021 FINAL  Final   Organism ID, Bacteria ESCHERICHIA COLI (A)  Final      Susceptibility   Escherichia coli - MIC*    AMPICILLIN >=32 RESISTANT Resistant     CEFAZOLIN 32 INTERMEDIATE Intermediate     CEFEPIME <=0.12 SENSITIVE Sensitive     CEFTRIAXONE <=0.25 SENSITIVE Sensitive     CIPROFLOXACIN <=0.25 SENSITIVE Sensitive     GENTAMICIN <=1 SENSITIVE Sensitive     IMIPENEM <=0.25 SENSITIVE Sensitive     NITROFURANTOIN <=16 SENSITIVE Sensitive     TRIMETH/SULFA <=20 SENSITIVE Sensitive     AMPICILLIN/SULBACTAM >=32 RESISTANT Resistant     PIP/TAZO 32 INTERMEDIATE Intermediate     * >=100,000 COLONIES/mL ESCHERICHIA COLI  Blood culture (routine x 2)     Status: None   Collection Time: 12/05/21  1:45 AM   Specimen: BLOOD  Result Value Ref Range Status   Specimen Description BLOOD RIGHT ARM  Final   Special Requests   Final    BOTTLES DRAWN AEROBIC AND ANAEROBIC Blood Culture results may not be optimal due to an excessive volume of blood received in culture bottles   Culture   Final    NO GROWTH 5 DAYS Performed at [facility], [place] [address] [place], [place] [address]    Report Status 12/10/2021 FINAL  Final  Resp Panel by RT-PCR (Flu A&B, Covid) Nasopharyngeal Swab     Status: None   Collection Time: 12/05/21  2:30 AM   Specimen: Nasopharyngeal Swab; Nasopharyngeal(NP) swabs in vial transport medium  Result Value Ref Range Status   SARS Coronavirus 2 by RT PCR NEGATIVE NEGATIVE Final    Comment: (NOTE) SARS-CoV-2 target nucleic acids are NOT DETECTED.  The SARS-CoV-2 RNA is generally detectable in upper respiratory specimens during the acute phase of infection. The lowest concentration of SARS-CoV-2 viral copies this assay can detect is 138 copies/mL. A negative  result does not preclude SARS-Cov-2 infection and should not be used as the sole basis for treatment or other patient management decisions. A negative result may occur with  improper specimen collection/handling, submission of specimen other than nasopharyngeal swab, presence of viral mutation(s) within the areas targeted by this assay, and inadequate number of viral copies(<138 copies/mL). A negative result must be combined with clinical observations, patient history, and epidemiological information. The expected result is Negative.  Fact Sheet for Patients:  EntrepreneurPulse.com.au  Fact Sheet for Healthcare Providers:  IncredibleEmployment.be  This test is no t yet approved or cleared by the [place] FDA and  has been authorized for detection and/or diagnosis of SARS-CoV-2 by FDA under an Emergency Use Authorization (EUA). This EUA will remain  in effect (meaning this test can be used) for the duration of the COVID-19 declaration under Section 564(b)(1) of the Act, 21 U.S.C.section 360bbb-3(b)(1), unless the authorization is terminated  or revoked sooner.       Influenza A by PCR NEGATIVE NEGATIVE Final   Influenza B by PCR NEGATIVE NEGATIVE Final    Comment: (NOTE) The Xpert Xpress SARS-CoV-2/FLU/RSV plus assay  is intended as an aid in the diagnosis of influenza from Nasopharyngeal swab specimens and should not be used as a sole basis for treatment. Nasal washings and aspirates are unacceptable for Xpert Xpress SARS-CoV-2/FLU/RSV testing.  Fact Sheet for Patients: EntrepreneurPulse.com.au  Fact Sheet for Healthcare Providers: IncredibleEmployment.be  This test is not yet approved or cleared by the [place] FDA and has been authorized for detection and/or diagnosis of SARS-CoV-2 by FDA under an Emergency Use Authorization (EUA). This EUA will remain in effect (meaning this test can be used)  for the duration of the COVID-19 declaration under Section 564(b)(1) of the Act, 21 U.S.C. section 360bbb-3(b)(1), unless the authorization is terminated or revoked.  Performed at [facility], [place] [address] [place], [place] [address]   Blood culture (routine x 2)     Status: None   Collection Time: 12/05/21  8:35 AM   Specimen: BLOOD LEFT FOREARM  Result Value Ref Range Status   Specimen Description BLOOD LEFT FOREARM  Final   Special Requests   Final    BOTTLES DRAWN AEROBIC AND ANAEROBIC Blood Culture results may not be optimal due to an inadequate volume of blood received in culture bottles   Culture   Final    NO GROWTH 5 DAYS Performed at [facility], [place] [address], [place], [place] [address]    Report Status 12/10/2021 FINAL  Final  MRSA Next Gen by PCR, Nasal     Status: None   Collection Time: 12/07/21  7:54 PM   Specimen: Nasal Mucosa; Nasal Swab  Result Value Ref Range Status   MRSA by PCR Next Gen NOT DETECTED NOT DETECTED Final    Comment: (NOTE) The GeneXpert MRSA Assay (FDA approved for NASAL specimens only), is one component of a comprehensive MRSA colonization surveillance program. It is not intended to diagnose MRSA infection nor to guide or monitor treatment for MRSA infections. Test performance is not FDA approved in patients less than 56 years old. Performed at [facility], [place] [address] [place], [place] [address]   Resp Panel by RT-PCR (Flu A&B, Covid) Nasopharyngeal Swab     Status: None   Collection Time: 12/09/21 10:39 AM   Specimen: Nasopharyngeal Swab; Nasopharyngeal(NP) swabs in vial transport medium  Result Value Ref Range Status   SARS Coronavirus 2 by RT PCR NEGATIVE NEGATIVE Final    Comment: (NOTE) SARS-CoV-2 target nucleic acids are NOT DETECTED.  The SARS-CoV-2 RNA is generally detectable in upper respiratory specimens during the acute phase of infection. The lowest concentration of SARS-CoV-2 viral  copies this assay can detect is 138 copies/mL. A negative result does not preclude SARS-Cov-2 infection and should not be used as the sole basis for treatment or other patient management decisions. A negative result may occur with  improper specimen collection/handling, submission of specimen other than nasopharyngeal swab, presence of viral mutation(s) within the areas targeted by this assay, and inadequate number of viral copies(<138 copies/mL). A negative result must be combined with clinical observations, patient history, and epidemiological information. The expected result is Negative.  Fact Sheet for Patients:  EntrepreneurPulse.com.au  Fact Sheet for Healthcare Providers:  IncredibleEmployment.be  This test is no t yet approved or cleared by the [place] FDA and  has been authorized for detection and/or diagnosis of SARS-CoV-2 by FDA under an Emergency Use Authorization (EUA). This EUA will remain  in effect (meaning this test can be used) for the duration of the COVID-19 declaration under  Section 564(b)(1) of the Act, 21 U.S.C.section 360bbb-3(b)(1), unless the authorization is terminated  or revoked sooner.       Influenza A by PCR NEGATIVE NEGATIVE Final   Influenza B by PCR NEGATIVE NEGATIVE Final    Comment: (NOTE) The Xpert Xpress SARS-CoV-2/FLU/RSV plus assay is intended as an aid in the diagnosis of influenza from Nasopharyngeal swab specimens and should not be used as a sole basis for treatment. Nasal washings and aspirates are unacceptable for Xpert Xpress SARS-CoV-2/FLU/RSV testing.  Fact Sheet for Patients: EntrepreneurPulse.com.au  Fact Sheet for Healthcare Providers: IncredibleEmployment.be  This test is not yet approved or cleared by the [place] FDA and has been authorized for detection and/or diagnosis of SARS-CoV-2 by FDA under an Emergency Use Authorization (EUA). This  EUA will remain in effect (meaning this test can be used) for the duration of the COVID-19 declaration under Section 564(b)(1) of the Act, 21 U.S.C. section 360bbb-3(b)(1), unless the authorization is terminated or revoked.  Performed at [facility], [place] [address] [place],  [address]     [facility] If 7PM-7AM, please contact night-coverage at [email or web address], Office  [phone]   12/11/2021, 3:09 PM  LOS: 6 days

## 2021-12-11 NOTE — Progress Notes (Signed)
Progress Note  Patient Name: [name] Date of Encounter: 12/11/2021  [facility] Cardiologist: Kirk Ruths, MD   Subjective   No CP or dyspnea  Inpatient Medications    Scheduled Meds:  amiodarone  200 mg Oral BID   collagenase   Topical Daily   dapsone  50 mg Oral Daily   digoxin  0.125 mg Oral Daily   feeding supplement  237 mL Oral TID BM   levothyroxine  50 mcg Oral Daily   mouth rinse  15 mL Mouth Rinse BID   polyethylene glycol  17 g Oral Daily   senna-docusate  1 tablet Oral BID   sodium chloride flush  3 mL Intravenous Q12H   Continuous Infusions:  cefTRIAXone (ROCEPHIN)  IV 1 g (12/10/21 1751)   PRN Meds: acetaminophen **OR** acetaminophen, magic mouthwash, metoprolol tartrate, oxyCODONE   Vital Signs    Vitals:   12/10/21 1755 12/10/21 2028 12/10/21 2056 12/11/21 0511  BP: 99/74 100/73  107/80  Pulse: (!) 111 82  (!) 104  Resp: 19 19  18   Temp: 97.6 F (36.4 C)  98.5 F (36.9 C) 98.5 F (36.9 C)  TempSrc:   Oral Oral  SpO2:      Weight:      Height:        Intake/Output Summary (Last 24 hours) at 12/11/2021 0740 Last data filed at 12/11/2021 4818 Gross per 24 hour  Intake 240 ml  Output 800 ml  Net -560 ml    Last 3 Weights 12/09/2021 12/06/2021 12/05/2021  Weight (lbs) 171 lb 15.3 oz 155 lb 3.3 oz 147 lb 0.8 oz  Weight (kg) 78 kg 70.4 kg 66.7 kg      Telemetry    Atrial fibrillation rate upper normal- Personally Reviewed  Physical Exam   GEN: NAD, somewhat frail Neck: JVP elevated Cardiac: irregular, 2/6 systolic murmur Respiratory: CTA anteriorly; no rhonchi GI: Soft, NT/ND, no masses MS: 1+ ankle edema Neuro: no focal findings Psych: Normal affect   Labs     Chemistry Recent Labs  Lab 12/09/21 0405 12/10/21 0241 12/11/21 0248  NA 133* 132* 133*  K 3.2* 4.2 4.3  CL 106 105 107  CO2 22 22 22   GLUCOSE 113* 118* 89  BUN 24* 23 19  CREATININE 0.64 0.63 0.65  CALCIUM 7.6* 7.8* 8.0*  MG 1.9 2.0 2.0   PROT 5.0* 5.1* 5.0*  ALBUMIN 2.0* 1.9* 2.0*  AST 44* 49* 41  ALT 39 42 37  ALKPHOS 51 58 64  BILITOT 0.9 0.9 0.7  GFRNONAA >60 >60 >60  ANIONGAP 5 5 4*    Hematology Recent Labs  Lab 12/10/21 0241 12/10/21 1213 12/11/21 0248  WBC 9.3 9.4 7.9  RBC 2.79* 3.03* 2.67*  HGB 9.5* 10.6* 9.3*  HCT 29.5* 32.3* 28.1*  MCV 105.7* 106.6* 105.2*  MCH 34.1* 35.0* 34.8*  MCHC 32.2 32.8 33.1  RDW 15.4 15.5 15.4  PLT 165 190 189    Thyroid  Recent Labs  Lab 12/05/21 1930  TSH 2.229      TTE 04/2021: IMPRESSIONS   1. Left ventricular ejection fraction, by estimation, is 60 to 65%. The  left ventricle has normal function. The left ventricle has no regional  wall motion abnormalities. Left ventricular diastolic parameters are  indeterminate.   2. Right ventricular systolic function is normal. The right ventricular  size is mildly enlarged. There is mildly elevated pulmonary artery  systolic pressure.   3. Left atrial size was severely  dilated.   4. Right atrial size was mildly dilated.   5. The mitral valve is normal in structure. Mild mitral valve  regurgitation. No evidence of mitral stenosis.   6. Tricuspid valve regurgitation is severe.   7. The aortic valve is tricuspid. There is moderate calcification of the  aortic valve. There is moderate thickening of the aortic valve. Aortic  valve regurgitation is not visualized. Mild to moderate aortic valve  sclerosis/calcification is present,  without any evidence of aortic stenosis.   8. There is mild dilatation of the aortic root, measuring 40 mm.   9. The inferior vena cava is dilated in size with >50% respiratory  variability, suggesting right atrial pressure of 8 mmHg.   Patient Profile     87 y.o. male with history of persistent Afib, prior CVA, hypothyroidism, and mild TAA who presented after a mechanical fall where he was down for 2 days found to have rhabdomyolysis and Afib with RVR. Cardiology is consulted regarding  Afib with RVR.  Assessment & Plan    1 Permanent atrial fibrillation-heart rate controlled.  Continue amiodarone 200 mg twice daily for another 5 days then decrease to 200 mg daily thereafter.  Continue digoxin.  Will need level drawn in approximately 1 to 2 weeks.  Apixaban has been placed on hold due to bleeding from decubitus ulcer.  Will resume when this improves.  Patient will be at higher risk of embolic event off of anticoagulation.  2 s/p Fall with rhabdomyolysis-improved.  Patient now appears to be mildly volume overloaded.  We will give Lasix 20 mg IV x1 today.  Will need to resume 20 mg daily at discharge.  3 MR/TR-conservative measures given pt's age and overall medical condition.  For questions or updates, please contact [place] Please consult [email or web address] for contact info under        Signed, Kirk Ruths, MD  12/11/2021, 7:40 AM

## 2021-12-11 NOTE — TOC Progression Note (Signed)
Transition of Care [facility]) - Progression Note    Patient Details  Name: [name] MRN: [id number] Date of Birth: 02/26/1930  Transition of Care [facility]) CM/SW [place], [place] Phone Number: 12/11/2021, 11:34 AM  Clinical Narrative:    CSW confirmed with MD that pt is not medically ready to DC today. [facility] cannot accept over the weekend, but will be able to accept Monday. Pt will need a new covid test on Sunday. Family notified and plan is to DC to [facility] Monday. TOC will continue to follow for DC needs.   Expected Discharge Plan: [place] Barriers to Discharge: Continued Medical Work up  Expected Discharge Plan and Services Expected Discharge Plan: [place] In-house Referral: Clinical Social Work     Living arrangements for the past 2 months: Single Family Home                                       Social Determinants of Health (SDOH) Interventions    Readmission Risk Interventions No flowsheet data found.

## 2021-12-11 NOTE — Progress Notes (Signed)
Physical Therapy Wound Treatment Patient Details  Name: [name] MRN: [id number] Date of Birth: 1930/06/29  Today's Date: 12/11/2021 Time: 3818-2993 Time Calculation (min): 44 min  Subjective  Subjective Assessment Subjective: Pt agreeable to hydrotherapy treatment; pt son present initially Patient and Family Stated Goals: for wounds to heal Date of Onset:  (unknown) Prior Treatments:  (dressing change)  Pain Score:  2-4/10  with premedication  Wound Assessment  Pressure Injury 12/05/21 Sacrum Unstageable - Full thickness tissue loss in which the base of the injury is covered by slough (yellow, tan, gray, green or brown) and/or eschar (tan, brown or black) in the wound bed. (Active)  Wound Image   12/08/21 1109  Dressing Type Barrier Film (skin prep);ABD;Gauze (Comment);Moist to dry;Normal saline moist dressing;Santyl 12/11/21 1131  Dressing Changed;Clean;Dry;Intact 12/11/21 1131  Dressing Change Frequency Daily 12/11/21 1131  State of Healing Other (Comment) 12/11/21 1131  Site / Wound Assessment Purple;Pink;Red;Yellow;Brown 12/11/21 1131  % Wound base Red or Granulating 10% 12/11/21 1131  % Wound base Yellow/Fibrinous Exudate 75% 12/11/21 1131  % Wound base Black/Eschar 5% 12/11/21 1131  % Wound base Other/Granulation Tissue (Comment) 10% 12/11/21 1131  Peri-wound Assessment Erythema (blanchable) 12/11/21 1131  Wound Length (cm) 6 cm 12/08/21 1109  Wound Width (cm) 7 cm 12/08/21 1109  Wound Depth (cm) 0.1 cm 12/08/21 1109  Wound Surface Area (cm^2) 42 cm^2 12/08/21 1109  Wound Volume (cm^3) 4.2 cm^3 12/08/21 1109  Margins Unattached edges (unapproximated) 12/11/21 1131  Drainage Amount Moderate 12/11/21 1131  Drainage Description Serous 12/11/21 1131  Treatment Cleansed;Debridement (Selective);Hydrotherapy (Pulse lavage);Packing (Saline gauze) 12/11/21 1131      Hydrotherapy Pulsed lavage therapy - wound location: sacrum Pulsed Lavage with Suction (psi): 12 psi  (8-12) Pulsed Lavage with Suction - Normal Saline Used: 1000 mL Pulsed Lavage Tip: Tip with splash shield Selective Debridement Selective Debridement - Location: sacrum Selective Debridement - Tools Used: Forceps, Scalpel, Scissors Selective Debridement - Tissue Removed: eschar    Wound Assessment and Plan  Wound Therapy - Assess/Plan/Recommendations Wound Therapy - Clinical Statement: Pt presents to hydrotherapy with unstageable sacral wound. Will benefit from pulsatile lavage and selective debridement to remove necrotic tissue and promote healing. Wound Therapy - Functional Problem List: decreased mobility Factors Delaying/Impairing Wound Healing: Immobility Hydrotherapy Plan: Debridement, Dressing change, Patient/family education, Pulsatile lavage with suction Wound Therapy - Frequency: 6X / week Wound Therapy - Follow Up Recommendations: dressing changes by RN  Wound Therapy Goals- Improve the function of patient's integumentary system by progressing the wound(s) through the phases of wound healing (inflammation - proliferation - remodeling) by: Wound Therapy Goals - Improve the function of patient's integumentary system by progressing the wound(s) through the phases of wound healing by: Decrease Necrotic Tissue to: 50 Decrease Necrotic Tissue - Progress: Progressing toward goal Increase Granulation Tissue to: 50 Increase Granulation Tissue - Progress: Progressing toward goal Goals/treatment plan/discharge plan were made with and agreed upon by patient/family: Yes Time For Goal Achievement: 7 days Wound Therapy - Potential for Goals: Fair  Goals will be updated until maximal potential achieved or discharge criteria met.  Discharge criteria: when goals achieved, discharge from hospital, MD decision/surgical intervention, no progress towards goals, refusal/missing three consecutive treatments without notification or medical reason.  GP     Charges PT Wound Care Charges $Wound  Debridement up to 20 cm: < or equal to 20 cm $ Wound Debridement each add'l 20 sqcm: 2 $PT PLS Gun and Tip: 1 Supply $PT Hydrotherapy Visit: 1 Visit  Tessie Fass Mahdiya Mossberg 12/11/2021, 11:36 AM 12/11/2021  [name]., PT Acute Rehabilitation Services [phone]  (pager) [phone]  (office)

## 2021-12-12 DIAGNOSIS — I4891 Unspecified atrial fibrillation: Secondary | ICD-10-CM | POA: Diagnosis not present

## 2021-12-12 LAB — COMPREHENSIVE METABOLIC PANEL
ALT: 35 U/L (ref 0–44)
AST: 58 U/L — ABNORMAL HIGH (ref 15–41)
Albumin: 2 g/dL — ABNORMAL LOW (ref 3.5–5.0)
Alkaline Phosphatase: 71 U/L (ref 38–126)
Anion gap: 5 (ref 5–15)
BUN: 18 mg/dL (ref 8–23)
CO2: 24 mmol/L (ref 22–32)
Calcium: 8.1 mg/dL — ABNORMAL LOW (ref 8.9–10.3)
Chloride: 104 mmol/L (ref 98–111)
Creatinine, Ser: 0.72 mg/dL (ref 0.61–1.24)
GFR, Estimated: >60 mL/min (ref >60)
Glucose, Bld: 104 mg/dL — ABNORMAL HIGH (ref 70–99)
Potassium: 4.6 mmol/L (ref 3.5–5.1)
Sodium: 133 mmol/L — ABNORMAL LOW (ref 135–145)
Total Bilirubin: 0.9 mg/dL (ref 0.3–1.2)
Total Protein: 5 g/dL — ABNORMAL LOW (ref 6.5–8.1)

## 2021-12-12 LAB — MAGNESIUM: Magnesium: 1.9 mg/dL (ref 1.7–2.4)

## 2021-12-12 MED ORDER — FUROSEMIDE 10 MG/ML IJ SOLN
20.0000 mg | Freq: Once | INTRAMUSCULAR | Status: AC
  Administered 2021-12-12: 12:00:00 20 mg via INTRAVENOUS
  Filled 2021-12-12: qty 2

## 2021-12-12 NOTE — Progress Notes (Signed)
Physical Therapy Wound Treatment and Discharge Patient Details  Name: [name] MRN: [id number] Date of Birth: 1930-11-30  Today's Date: 12/12/2021 Time: 6720-9470 Time Calculation (min): 37 min  Subjective  Subjective Assessment Subjective: Pt agreeable to hydrotherapy treatment Patient and Family Stated Goals: for wounds to heal Date of Onset:  (unknown) Prior Treatments:  (dressing change)  Pain Score:    Wound Assessment  Pressure Injury 12/05/21 Sacrum Unstageable - Full thickness tissue loss in which the base of the injury is covered by slough (yellow, tan, gray, green or brown) and/or eschar (tan, brown or black) in the wound bed. (Active)  Wound Image  12/08/21 1109  Dressing Type Barrier Film (skin prep);Foam - Lift dressing to assess site every shift;Gauze (Comment);Santyl 12/12/21 1110  Dressing Changed 12/12/21 1110  Dressing Change Frequency Daily 12/12/21 1110  State of Healing Other (Comment) 12/12/21 1110  Site / Wound Assessment Bleeding;Yellow;Pink 12/12/21 1110  % Wound base Red or Granulating 10% 12/12/21 1110  % Wound base Yellow/Fibrinous Exudate 80% 12/12/21 1110  % Wound base Black/Eschar 0% 12/12/21 1110  % Wound base Other/Granulation Tissue (Comment) 10% 12/12/21 1110  Peri-wound Assessment Erythema (blanchable) 12/12/21 1110  Wound Length (cm) 6 cm 12/08/21 1109  Wound Width (cm) 7 cm 12/08/21 1109  Wound Depth (cm) 0.1 cm 12/08/21 1109  Wound Surface Area (cm^2) 42 cm^2 12/08/21 1109  Wound Volume (cm^3) 4.2 cm^3 12/08/21 1109  Margins Unattached edges (unapproximated) 12/12/21 1110  Drainage Amount Moderate 12/12/21 1110  Drainage Description Serosanguineous;No odor 12/12/21 1110  Treatment Debridement (Selective);Hydrotherapy (Pulse lavage);Packing (Saline gauze) 12/12/21 1110      Hydrotherapy Pulsed lavage therapy - wound location: sacrum Pulsed Lavage with Suction (psi): 8 psi Pulsed Lavage with Suction - Normal Saline Used: 1000  mL Pulsed Lavage Tip: Tip with splash shield Selective Debridement Selective Debridement - Location: sacrum Selective Debridement - Tools Used: Forceps, Scalpel Selective Debridement - Tissue Removed: eschar    Wound Assessment and Plan  Wound Therapy - Assess/Plan/Recommendations Wound Therapy - Clinical Statement: Yellow adherent tissue remains. Noted plans to stop hydrotherapy as pt will resume blood thinners (and had been bleeding with sharp debridement). Wound Therapy - Functional Problem List: decreased mobility Factors Delaying/Impairing Wound Healing: Immobility Hydrotherapy Plan: Debridement, Dressing change, Patient/family education, Pulsatile lavage with suction Wound Therapy - Frequency: 6X / week Wound Therapy - Follow Up Recommendations: dressing changes by RN  Wound Therapy Goals- Improve the function of patient's integumentary system by progressing the wound(s) through the phases of wound healing (inflammation - proliferation - remodeling) by: Wound Therapy Goals - Improve the function of patient's integumentary system by progressing the wound(s) through the phases of wound healing by: Decrease Necrotic Tissue to: 50 Decrease Necrotic Tissue - Progress: Not met Increase Granulation Tissue to: 50 Increase Granulation Tissue - Progress: Not met Goals/treatment plan/discharge plan were made with and agreed upon by patient/family: Yes Time For Goal Achievement: 7 days Wound Therapy - Potential for Goals: Fair  Goals will be updated until maximal potential achieved or discharge criteria met.  Discharge criteria: when goals achieved, discharge from hospital, MD decision/surgical intervention, no progress towards goals, refusal/missing three consecutive treatments without notification or medical reason.  GP     Charges PT Wound Care Charges $Wound Debridement up to 20 cm: < or equal to 20 cm $ Wound Debridement each add'l 20 sqcm: 2 $PT PLS Gun and Tip: 1 Supply $PT  Hydrotherapy Visit: 1 Visit     Patient is being discharged  from PT services secondary to:  Will be resuming blood thinners with plan to stop hydrotherapy due to excessive bleeding with procedure when on thinners.   Please see above for current wound status and progress toward goals.  Progress and discharge plan and discussed with patient/caregiver and they  [place], [place]  Pager [phone] Office [phone]   Rexanne Mano 12/12/2021, 11:17 AM

## 2021-12-12 NOTE — Progress Notes (Signed)
PROGRESS NOTE    [name]  IZT:[id number] DOB: October 29, 1930 DOA: 12/05/2021 PCP: [name], MD   Chief Complain: Found on the floor  Brief Narrative: Patient is a 87 -year-old male with past medical history of A. fib on Eliquis, CKD stage II, hypothyroidism who was found to be fallen on the floor by his son and was brought to the hospital.  On presentation he was found to be in A. fib with RVR and possible UTI.  On prednisone CK was found to be elevated, lab work showed AKI.  Started on IV fluids, cardiology consulted for A. fib with RVR.  PT/OT recommending skilled nursing facility on discharge.  Currently on hydrotherapy by PT for unstageable sacral wound.  Plan for discharge on Monday  Assessment & Plan:   Principal Problem:   Rhabdomyolysis Active Problems:   Macrocytic anemia   Hypothyroidism   Atrial fibrillation with RVR (HCC)   Mitral regurgitation   Acute renal failure superimposed on stage 2 chronic kidney disease (HCC)   Metabolic acidosis   Hypothermia   Pressure injury of skin   Fall/rhabdomyolysis/AKI on CKD stage II: Found to be on the floor by son at home.  On presentation, creatinine was elevated, elevated CK.  Started on IV fluids.  IV fluid has been discontinued  A. fib with RVR: Noted to be in A. fib with RVR on presentation.  Cardiology was consulted.  Currently on amiodarone.  Currently on twice a day plan for a week then continue 20 mg daily.  Also on digoxin.  Beta-blockers discontinued, on Eliquis for anticoagulation.  Currently rate is controlled  Rectal bleeding: Eliquis has been held.  Hemoglobin currently stable in the range of 9.  Has history of internal hemorrhoids as seen on the colonoscopy in 2011.  Will plan to restart Eliquis after patient completes chemotherapy on Sunday.  Bleeding from decubitus ulcer: Currently on hydrotherapy.  Eliquis on hold.  Currently hemoglobin stable.  Sepsis due to UTI: Presented with tachycardia, hypotension,  hypothermia, lactic stenosis, AKI.  Urine culture showed E. coli.  Treated with ceftriaxone, completed course.  Sepsis physiology has resolved.  Right knee pain: Most likely from osteoarthritis.  X-ray showed degenerative changes with a small joint effusion, no acute fracture or dislocation.  Continue supportive care, pain management  History of mitral/tricuspid regurgitation: Severe TR and mild MR was noted on echo on May 2022.  He will follow-up with cardiology as an outpatient  Hypothyroidism: Continue Synthroid  Hypokalemia: Supplemented and corrected  Hyponatremia: Mild.  Sodium in the range of 133.  Continue to monitor  Debility/deconditioning: PT/OT recommended skilled NF discharge             DVT prophylaxis:Eliquis Code Status: Full Family Communication: None at the bedside Patient status:Inpatient  Dispo: The patient is from: home              Anticipated d/c is to: SNF              Anticipated d/c date is: Monday  Consultants: Cardiology  Procedures: Hydrotherapy  Antimicrobials:  Anti-infectives (From admission, onward)    Start     Dose/Rate Route Frequency Ordered Stop   12/09/21 1830  cefTRIAXone (ROCEPHIN) 1 g in sodium chloride 0.9 % 100 mL IVPB        1 g 200 mL/hr over 30 Minutes Intravenous Every 24 hours 12/09/21 1742 12/12/21 0837   12/06/21 1500  dapsone tablet 50 mg  50 mg Oral Daily 12/06/21 1408     12/05/21 1045  cefTRIAXone (ROCEPHIN) 1 g in sodium chloride 0.9 % 100 mL IVPB  Status:  Discontinued        1 g 200 mL/hr over 30 Minutes Intravenous Every 24 hours 12/05/21 1038 12/07/21 1618       Subjective: Patient seen and examined at the bedside this morning.  Hemodynamically stable.  Sitting on the bed.  Denies any new complaints today.  He was not comfortable for examining the wound on his sacrum so wound examination was deferred. Objective: Vitals:   12/11/21 2324 12/12/21 0410 12/12/21 0414 12/12/21 0800  BP: 113/76 112/72   100/79  Pulse: (!) 104 (!) 108  100  Resp: 18 19  16   Temp: 98.2 F (36.8 C) 98.8 F (37.1 C)  98.5 F (36.9 C)  TempSrc: Oral Oral  Oral  SpO2: 93% 94%    Weight:   83 kg   Height:        Intake/Output Summary (Last 24 hours) at 12/12/2021 1028 Last data filed at 12/12/2021 0900 Gross per 24 hour  Intake 840 ml  Output 2050 ml  Net -1210 ml   Filed Weights   12/06/21 0514 12/09/21 0500 12/12/21 0414  Weight: 70.4 kg 78 kg 83 kg    Examination:  General exam: Overall comfortable, not in distress, pleasant elderly male, deconditioned HEENT: PERRL Respiratory system:  no wheezes or crackles  Cardiovascular system: Irregularly irregular rhythm Gastrointestinal system: Abdomen is nondistended, soft and nontender. Central nervous system: Alert and oriented Extremities: No edema, no clubbing ,no cyanosis Skin: Sacral decubitus ulcer on the back    Data Reviewed: I have personally reviewed following labs and imaging studies  CBC: Recent Labs  Lab 12/08/21 0423 12/09/21 0405 12/10/21 0241 12/10/21 1213 12/11/21 0248  WBC 8.4 8.6 9.3 9.4 7.9  NEUTROABS  --  6.4 7.0  --  5.3  HGB 9.6* 10.0* 9.5* 10.6* 9.3*  HCT 28.6* 29.7* 29.5* 32.3* 28.1*  MCV 104.8* 104.2* 105.7* 106.6* 105.2*  PLT 137* 140* 165 190 539   Basic Metabolic Panel: Recent Labs  Lab 12/08/21 0423 12/09/21 0405 12/10/21 0241 12/11/21 0248 12/12/21 0239  NA 135 133* 132* 133* 133*  K 3.3* 3.2* 4.2 4.3 4.6  CL 106 106 105 107 104  CO2 23 22 22 22 24   GLUCOSE 118* 113* 118* 89 104*  BUN 21 24* 23 19 18   CREATININE 0.59* 0.64 0.63 0.65 0.72  CALCIUM 7.7* 7.6* 7.8* 8.0* 8.1*  MG  --  1.9 2.0 2.0 1.9   GFR: Estimated Creatinine Clearance: 68.7 mL/min (by C-G formula based on SCr of 0.72 mg/dL). Liver Function Tests: Recent Labs  Lab 12/08/21 0423 12/09/21 0405 12/10/21 0241 12/11/21 0248 12/12/21 0239  AST 47* 44* 49* 41 58*  ALT 44 39 42 37 35  ALKPHOS 53 51 58 64 71  BILITOT 1.0  0.9 0.9 0.7 0.9  PROT 5.2* 5.0* 5.1* 5.0* 5.0*  ALBUMIN 2.1* 2.0* 1.9* 2.0* 2.0*   No results for input(s): LIPASE, AMYLASE in the last 168 hours. No results for input(s): AMMONIA in the last 168 hours. Coagulation Profile: No results for input(s): INR, PROTIME in the last 168 hours. Cardiac Enzymes: Recent Labs  Lab 12/06/21 0235 12/07/21 0310 12/08/21 0423  CKTOTAL 676* 312 143   BNP (last 3 results) No results for input(s): PROBNP in the last 8760 hours. HbA1C: No results for input(s): HGBA1C in the last 72 hours.  CBG: Recent Labs  Lab 12/11/21 0844  GLUCAP 90   Lipid Profile: No results for input(s): CHOL, HDL, LDLCALC, TRIG, CHOLHDL, LDLDIRECT in the last 72 hours. Thyroid Function Tests: No results for input(s): TSH, T4TOTAL, FREET4, T3FREE, THYROIDAB in the last 72 hours. Anemia Panel: No results for input(s): VITAMINB12, FOLATE, FERRITIN, TIBC, IRON, RETICCTPCT in the last 72 hours. Sepsis Labs: No results for input(s): PROCALCITON, LATICACIDVEN in the last 168 hours.  Recent Results (from the past 240 hour(s))  Urine Culture     Status: Abnormal   Collection Time: 12/05/21  1:38 AM   Specimen: Urine, Clean Catch  Result Value Ref Range Status   Specimen Description URINE, CLEAN CATCH  Final   Special Requests   Final    NONE Performed at [facility], [address], [place], [place] [address]    Culture >=100,000 COLONIES/mL ESCHERICHIA COLI (A)  Final   Report Status 12/07/2021 FINAL  Final   Organism ID, Bacteria ESCHERICHIA COLI (A)  Final      Susceptibility   Escherichia coli - MIC*    AMPICILLIN >=32 RESISTANT Resistant     CEFAZOLIN 32 INTERMEDIATE Intermediate     CEFEPIME <=0.12 SENSITIVE Sensitive     CEFTRIAXONE <=0.25 SENSITIVE Sensitive     CIPROFLOXACIN <=0.25 SENSITIVE Sensitive     GENTAMICIN <=1 SENSITIVE Sensitive     IMIPENEM <=0.25 SENSITIVE Sensitive     NITROFURANTOIN <=16 SENSITIVE Sensitive     TRIMETH/SULFA <=20  SENSITIVE Sensitive     AMPICILLIN/SULBACTAM >=32 RESISTANT Resistant     PIP/TAZO 32 INTERMEDIATE Intermediate     * >=100,000 COLONIES/mL ESCHERICHIA COLI  Blood culture (routine x 2)     Status: None   Collection Time: 12/05/21  1:45 AM   Specimen: BLOOD  Result Value Ref Range Status   Specimen Description BLOOD RIGHT ARM  Final   Special Requests   Final    BOTTLES DRAWN AEROBIC AND ANAEROBIC Blood Culture results may not be optimal due to an excessive volume of blood received in culture bottles   Culture   Final    NO GROWTH 5 DAYS Performed at [facility], [place] [address] [place], [place] [address]    Report Status 12/10/2021 FINAL  Final  Resp Panel by RT-PCR (Flu A&B, Covid) Nasopharyngeal Swab     Status: None   Collection Time: 12/05/21  2:30 AM   Specimen: Nasopharyngeal Swab; Nasopharyngeal(NP) swabs in vial transport medium  Result Value Ref Range Status   SARS Coronavirus 2 by RT PCR NEGATIVE NEGATIVE Final    Comment: (NOTE) SARS-CoV-2 target nucleic acids are NOT DETECTED.  The SARS-CoV-2 RNA is generally detectable in upper respiratory specimens during the acute phase of infection. The lowest concentration of SARS-CoV-2 viral copies this assay can detect is 138 copies/mL. A negative result does not preclude SARS-Cov-2 infection and should not be used as the sole basis for treatment or other patient management decisions. A negative result may occur with  improper specimen collection/handling, submission of specimen other than nasopharyngeal swab, presence of viral mutation(s) within the areas targeted by this assay, and inadequate number of viral copies(<138 copies/mL). A negative result must be combined with clinical observations, patient history, and epidemiological information. The expected result is Negative.  Fact Sheet for Patients:  EntrepreneurPulse.com.au  Fact Sheet for Healthcare Providers:   IncredibleEmployment.be  This test is no t yet approved or cleared by the [place] and  has been authorized  for detection and/or diagnosis of SARS-CoV-2 by FDA under an Emergency Use Authorization (EUA). This EUA will remain  in effect (meaning this test can be used) for the duration of the COVID-19 declaration under Section 564(b)(1) of the Act, 21 U.S.C.section 360bbb-3(b)(1), unless the authorization is terminated  or revoked sooner.       Influenza A by PCR NEGATIVE NEGATIVE Final   Influenza B by PCR NEGATIVE NEGATIVE Final    Comment: (NOTE) The Xpert Xpress SARS-CoV-2/FLU/RSV plus assay is intended as an aid in the diagnosis of influenza from Nasopharyngeal swab specimens and should not be used as a sole basis for treatment. Nasal washings and aspirates are unacceptable for Xpert Xpress SARS-CoV-2/FLU/RSV testing.  Fact Sheet for Patients: EntrepreneurPulse.com.au  Fact Sheet for Healthcare Providers: IncredibleEmployment.be  This test is not yet approved or cleared by the [place] FDA and has been authorized for detection and/or diagnosis of SARS-CoV-2 by FDA under an Emergency Use Authorization (EUA). This EUA will remain in effect (meaning this test can be used) for the duration of the COVID-19 declaration under Section 564(b)(1) of the Act, 21 U.S.C. section 360bbb-3(b)(1), unless the authorization is terminated or revoked.  Performed at [facility], [place] [address] [place], [place] [address]   Blood culture (routine x 2)     Status: None   Collection Time: 12/05/21  8:35 AM   Specimen: BLOOD LEFT FOREARM  Result Value Ref Range Status   Specimen Description BLOOD LEFT FOREARM  Final   Special Requests   Final    BOTTLES DRAWN AEROBIC AND ANAEROBIC Blood Culture results may not be optimal due to an inadequate volume of blood received in culture bottles   Culture   Final    NO GROWTH 5  DAYS Performed at [facility], [place] [address], [place], [place] [address]    Report Status 12/10/2021 FINAL  Final  MRSA Next Gen by PCR, Nasal     Status: None   Collection Time: 12/07/21  7:54 PM   Specimen: Nasal Mucosa; Nasal Swab  Result Value Ref Range Status   MRSA by PCR Next Gen NOT DETECTED NOT DETECTED Final    Comment: (NOTE) The GeneXpert MRSA Assay (FDA approved for NASAL specimens only), is one component of a comprehensive MRSA colonization surveillance program. It is not intended to diagnose MRSA infection nor to guide or monitor treatment for MRSA infections. Test performance is not FDA approved in patients less than 72 years old. Performed at [facility], [place] [address] [place], [place] [address]   Resp Panel by RT-PCR (Flu A&B, Covid) Nasopharyngeal Swab     Status: None   Collection Time: 12/09/21 10:39 AM   Specimen: Nasopharyngeal Swab; Nasopharyngeal(NP) swabs in vial transport medium  Result Value Ref Range Status   SARS Coronavirus 2 by RT PCR NEGATIVE NEGATIVE Final    Comment: (NOTE) SARS-CoV-2 target nucleic acids are NOT DETECTED.  The SARS-CoV-2 RNA is generally detectable in upper respiratory specimens during the acute phase of infection. The lowest concentration of SARS-CoV-2 viral copies this assay can detect is 138 copies/mL. A negative result does not preclude SARS-Cov-2 infection and should not be used as the sole basis for treatment or other patient management decisions. A negative result may occur with  improper specimen collection/handling, submission of specimen other than nasopharyngeal swab, presence of viral mutation(s) within the areas targeted by this assay, and inadequate number of viral copies(<138 copies/mL). A negative result must be  combined with clinical observations, patient history, and epidemiological information. The expected result is Negative.  Fact Sheet for Patients:   EntrepreneurPulse.com.au  Fact Sheet for Healthcare Providers:  IncredibleEmployment.be  This test is no t yet approved or cleared by the [place] FDA and  has been authorized for detection and/or diagnosis of SARS-CoV-2 by FDA under an Emergency Use Authorization (EUA). This EUA will remain  in effect (meaning this test can be used) for the duration of the COVID-19 declaration under Section 564(b)(1) of the Act, 21 U.S.C.section 360bbb-3(b)(1), unless the authorization is terminated  or revoked sooner.       Influenza A by PCR NEGATIVE NEGATIVE Final   Influenza B by PCR NEGATIVE NEGATIVE Final    Comment: (NOTE) The Xpert Xpress SARS-CoV-2/FLU/RSV plus assay is intended as an aid in the diagnosis of influenza from Nasopharyngeal swab specimens and should not be used as a sole basis for treatment. Nasal washings and aspirates are unacceptable for Xpert Xpress SARS-CoV-2/FLU/RSV testing.  Fact Sheet for Patients: EntrepreneurPulse.com.au  Fact Sheet for Healthcare Providers: IncredibleEmployment.be  This test is not yet approved or cleared by the [place] FDA and has been authorized for detection and/or diagnosis of SARS-CoV-2 by FDA under an Emergency Use Authorization (EUA). This EUA will remain in effect (meaning this test can be used) for the duration of the COVID-19 declaration under Section 564(b)(1) of the Act, 21 U.S.C. section 360bbb-3(b)(1), unless the authorization is terminated or revoked.  Performed at [facility], [place] [address], [place], [place] [address]          Radiology Studies: No results found.      Scheduled Meds:  amiodarone  200 mg Oral BID   collagenase   Topical Daily   dapsone  50 mg Oral Daily   digoxin  0.125 mg Oral Daily   feeding supplement  237 mL Oral TID BM   levothyroxine  50 mcg Oral Daily   mouth rinse  15 mL Mouth Rinse BID    polyethylene glycol  17 g Oral Daily   senna-docusate  1 tablet Oral BID   sodium chloride flush  3 mL Intravenous Q12H   Continuous Infusions:   LOS: 7 days    Time spent: 25 mins.More than 50% of that time was spent in counseling and/or coordination of care.      Shelly Coss, MD [facility] P12/24/2022, 10:28 AM

## 2021-12-12 NOTE — Plan of Care (Signed)
°  Problem: Coping: Goal: Level of anxiety will decrease Outcome: Progressing   Problem: Elimination: Goal: Will not experience complications related to bowel motility Outcome: Progressing Goal: Will not experience complications related to urinary retention Outcome: Progressing   Problem: Pain Managment: Goal: General experience of comfort will improve Outcome: Progressing   Problem: Safety: Goal: Ability to remain free from injury will improve Outcome: Progressing

## 2021-12-12 NOTE — Progress Notes (Addendum)
Progress Note  Patient Name: [name] Date of Encounter: 12/12/2021  [facility] Cardiologist: Kirk Ruths, MD   Subjective   Feeling well. Only complaint is back pain.  Inpatient Medications    Scheduled Meds:  amiodarone  200 mg Oral BID   collagenase   Topical Daily   dapsone  50 mg Oral Daily   digoxin  0.125 mg Oral Daily   feeding supplement  237 mL Oral TID BM   levothyroxine  50 mcg Oral Daily   mouth rinse  15 mL Mouth Rinse BID   polyethylene glycol  17 g Oral Daily   senna-docusate  1 tablet Oral BID   sodium chloride flush  3 mL Intravenous Q12H   Continuous Infusions:   PRN Meds: acetaminophen **OR** acetaminophen, magic mouthwash, metoprolol tartrate, oxyCODONE   Vital Signs    Vitals:   12/11/21 2324 12/12/21 0410 12/12/21 0414 12/12/21 0800  BP: 113/76 112/72  100/79  Pulse: (!) 104 (!) 108  100  Resp: 18 19  16   Temp: 98.2 F (36.8 C) 98.8 F (37.1 C)  98.5 F (36.9 C)  TempSrc: Oral Oral  Oral  SpO2: 93% 94%    Weight:   83 kg   Height:        Intake/Output Summary (Last 24 hours) at 12/12/2021 1157 Last data filed at 12/12/2021 0900 Gross per 24 hour  Intake 840 ml  Output 2050 ml  Net -1210 ml    Last 3 Weights 12/12/2021 12/09/2021 12/06/2021  Weight (lbs) 182 lb 15.7 oz 171 lb 15.3 oz 155 lb 3.3 oz  Weight (kg) 83 kg 78 kg 70.4 kg      Telemetry    Atrial fibrillation - personally reviewed  Physical Exam   GEN: Well nourished, well developed, in no acute distress  HEENT: normal  Neck: 8-10 cm JVD, carotid bruits, or masses Cardiac: irreguar; no murmurs, rubs, or gallops,no edema  Respiratory:  clear to auscultation bilaterally, normal work of breathing GI: soft, nontender, nondistended, + BS MS: no deformity or atrophy  Skin: warm and dry Neuro:  Strength and sensation are intact Psych: euthymic mood, full affect   Labs     Chemistry Recent Labs  Lab 12/10/21 0241 12/11/21 0248 12/12/21 0239   NA 132* 133* 133*  K 4.2 4.3 4.6  CL 105 107 104  CO2 22 22 24   GLUCOSE 118* 89 104*  BUN 23 19 18   CREATININE 0.63 0.65 0.72  CALCIUM 7.8* 8.0* 8.1*  MG 2.0 2.0 1.9  PROT 5.1* 5.0* 5.0*  ALBUMIN 1.9* 2.0* 2.0*  AST 49* 41 58*  ALT 42 37 35  ALKPHOS 58 64 71  BILITOT 0.9 0.7 0.9  GFRNONAA >60 >60 >60  ANIONGAP 5 4* 5    Hematology Recent Labs  Lab 12/10/21 0241 12/10/21 1213 12/11/21 0248  WBC 9.3 9.4 7.9  RBC 2.79* 3.03* 2.67*  HGB 9.5* 10.6* 9.3*  HCT 29.5* 32.3* 28.1*  MCV 105.7* 106.6* 105.2*  MCH 34.1* 35.0* 34.8*  MCHC 32.2 32.8 33.1  RDW 15.4 15.5 15.4  PLT 165 190 189    Thyroid  Recent Labs  Lab 12/05/21 1930  TSH 2.229      TTE 04/2021: IMPRESSIONS   1. Left ventricular ejection fraction, by estimation, is 60 to 65%. The  left ventricle has normal function. The left ventricle has no regional  wall motion abnormalities. Left ventricular diastolic parameters are  indeterminate.   2. Right ventricular systolic  function is normal. The right ventricular  size is mildly enlarged. There is mildly elevated pulmonary artery  systolic pressure.   3. Left atrial size was severely dilated.   4. Right atrial size was mildly dilated.   5. The mitral valve is normal in structure. Mild mitral valve  regurgitation. No evidence of mitral stenosis.   6. Tricuspid valve regurgitation is severe.   7. The aortic valve is tricuspid. There is moderate calcification of the  aortic valve. There is moderate thickening of the aortic valve. Aortic  valve regurgitation is not visualized. Mild to moderate aortic valve  sclerosis/calcification is present,  without any evidence of aortic stenosis.   8. There is mild dilatation of the aortic root, measuring 40 mm.   9. The inferior vena cava is dilated in size with >50% respiratory  variability, suggesting right atrial pressure of 8 mmHg.   Patient Profile     87 y.o. male with history of persistent Afib, prior CVA,  hypothyroidism, and mild TAA who presented after a mechanical fall where he was down for 2 days found to have rhabdomyolysis and Afib with RVR. Cardiology is consulted regarding Afib with RVR.  Assessment & Plan    Permanent atrial fibrillation: rate controlled. Continue amiodarone 200 mg BID for 4 days, then 200 mg daily for rate control if BP improves [name] be able to adjust to metoprolol. Would resume eliquis once bleeding from ulcer has resolved.  2. Rhabdomyolysis: post fall. Continue volume overload. Continue lasix.  3. MR/RT: conservative measures due to overall age and medical condition  Cardiology to see as needed this weekend. [name] see again Monday.  For questions or updates, please contact [place] Please consult [email or web address] for contact info under        Signed, [name] Meredith Leeds, MD  12/12/2021, 11:57 AM

## 2021-12-13 LAB — CBC WITH DIFFERENTIAL/PLATELET
Abs Immature Granulocytes: 0.1 10*3/uL — ABNORMAL HIGH (ref 0.00–0.07)
Basophils Absolute: 0 10*3/uL (ref 0.0–0.1)
Basophils Relative: 0 %
Eosinophils Absolute: 0.1 10*3/uL (ref 0.0–0.5)
Eosinophils Relative: 1 %
HCT: 28.5 % — ABNORMAL LOW (ref 39.0–52.0)
Hemoglobin: 9.6 g/dL — ABNORMAL LOW (ref 13.0–17.0)
Immature Granulocytes: 1 %
Lymphocytes Relative: 22 %
Lymphs Abs: 1.8 10*3/uL (ref 0.7–4.0)
MCH: 35 pg — ABNORMAL HIGH (ref 26.0–34.0)
MCHC: 33.7 g/dL (ref 30.0–36.0)
MCV: 104 fL — ABNORMAL HIGH (ref 80.0–100.0)
Monocytes Absolute: 0.7 10*3/uL (ref 0.1–1.0)
Monocytes Relative: 9 %
Neutro Abs: 5.4 10*3/uL (ref 1.7–7.7)
Neutrophils Relative %: 67 %
Platelets: 250 10*3/uL (ref 150–400)
RBC: 2.74 MIL/uL — ABNORMAL LOW (ref 4.22–5.81)
RDW: 15.3 % (ref 11.5–15.5)
WBC: 8.1 10*3/uL (ref 4.0–10.5)
nRBC: 0 % (ref 0.0–0.2)

## 2021-12-13 LAB — RESP PANEL BY RT-PCR (FLU A&B, COVID) ARPGX2
Influenza A by PCR: NEGATIVE
Influenza B by PCR: NEGATIVE
SARS Coronavirus 2 by RT PCR: NEGATIVE

## 2021-12-13 MED ORDER — APIXABAN 5 MG PO TABS
5.0000 mg | ORAL_TABLET | Freq: Two times a day (BID) | ORAL | Status: DC
  Administered 2021-12-13 – 2021-12-15 (×5): 5 mg via ORAL
  Filled 2021-12-13 (×5): qty 1

## 2021-12-13 NOTE — Plan of Care (Signed)

## 2021-12-13 NOTE — Progress Notes (Signed)
PROGRESS NOTE    [name]  GGY:[id number] DOB: 01/28/1930 DOA: 12/05/2021 PCP: [name], MD   Chief Complain: Found on the floor  Brief Narrative: Patient is a 87 -year-old male with past medical history of A. fib on Eliquis, CKD stage II, hypothyroidism who was found to be fallen on the floor by his son and was brought to the hospital.  On presentation he was found to be in A. fib with RVR and possible UTI.  On presentation,CK was found to be elevated, lab work showed AKI.  Started on IV fluids, cardiology consulted for A. fib with RVR.  PT/OT recommending skilled nursing facility on discharge. Treated with  hydrotherapy by PT for unstageable sacral wound.  Currently hemodynamically stable. plan for discharge on Monday  Assessment & Plan:   Principal Problem:   Rhabdomyolysis Active Problems:   Macrocytic anemia   Hypothyroidism   Atrial fibrillation with RVR (HCC)   Mitral regurgitation   Acute renal failure superimposed on stage 2 chronic kidney disease (HCC)   Metabolic acidosis   Hypothermia   Pressure injury of skin   Fall/rhabdomyolysis/AKI on CKD stage II: Found to be on the floor by son at home.  On presentation, creatinine was elevated, elevated CK.  Started on IV fluids.  IV fluid has been discontinued  A. fib with RVR: Noted to be in A. fib with RVR on presentation.  Cardiology was consulted.  Currently on amiodarone.  Currently on twice a day plan for a week then continue 20 mg daily.  Also on digoxin.  Beta-blockers discontinued, on Eliquis for anticoagulation.  Currently rate is controlled  Rectal bleeding: resolved.  Hemoglobin currently stable in the range of 9.  Has history of internal hemorrhoids as seen on the colonoscopy in 2011.  Will plan to restart Eliquis today  Bleeding from decubitus ulcer:Treated with hydrotherapy.  Sepsis due to UTI: Presented with tachycardia, hypotension, hypothermia, lactic stenosis, AKI.  Urine culture showed E. coli.   Treated with ceftriaxone, completed course.  Sepsis physiology has resolved.  Right knee pain: Most likely from osteoarthritis.  X-ray showed degenerative changes with a small joint effusion, no acute fracture or dislocation.  Continue supportive care, pain management  History of mitral/tricuspid regurgitation: Severe TR and mild MR was noted on echo on May 2022.  He will follow-up with cardiology as an outpatient  Hypothyroidism: Continue Synthroid  Hypokalemia: Supplemented and corrected  Hyponatremia: Mild.    Continue to monitor  Debility/deconditioning: PT/OT recommended skilled NF discharge             DVT prophylaxis:Eliquis Code Status: Full Family Communication: None at the bedside Patient status:Inpatient  Dispo: The patient is from: home              Anticipated d/c is to: SNF              Anticipated d/c date is: Monday  Consultants: Cardiology  Procedures: Hydrotherapy  Antimicrobials:  Anti-infectives (From admission, onward)    Start     Dose/Rate Route Frequency Ordered Stop   12/09/21 1830  cefTRIAXone (ROCEPHIN) 1 g in sodium chloride 0.9 % 100 mL IVPB        1 g 200 mL/hr over 30 Minutes Intravenous Every 24 hours 12/09/21 1742 12/12/21 0837   12/06/21 1500  dapsone tablet 50 mg        50 mg Oral Daily 12/06/21 1408     12/05/21 1045  cefTRIAXone (ROCEPHIN) 1 g in sodium  chloride 0.9 % 100 mL IVPB  Status:  Discontinued        1 g 200 mL/hr over 30 Minutes Intravenous Every 24 hours 12/05/21 1038 12/07/21 1618       Subjective:  Patient seen and examined at the bedside this morning.  Hemodynamically stable.  Lying on bed.  Denies any new complaints today.  Could not examine the sacral wound because patient cannot turn on the bed   Objective: Vitals:   12/11/21 2324 12/12/21 0410 12/12/21 0414 12/12/21 0800  BP: 113/76 112/72  100/79  Pulse: (!) 104 (!) 108  100  Resp: 18 19  16   Temp: 98.2 F (36.8 C) 98.8 F (37.1 C)  98.5 F (36.9  C)  TempSrc: Oral Oral  Oral  SpO2: 93% 94%    Weight:   83 kg   Height:        Intake/Output Summary (Last 24 hours) at 12/13/2021 0835 Last data filed at 12/12/2021 1813 Gross per 24 hour  Intake 600 ml  Output 1100 ml  Net -500 ml   Filed Weights   12/06/21 0514 12/09/21 0500 12/12/21 0414  Weight: 70.4 kg 78 kg 83 kg    Examination:   General exam: Overall comfortable, not in distress, pleasant elderly male, deconditioned HEENT: PERRL Respiratory system:  no wheezes or crackles  Cardiovascular system: Irregularly irregular rhythm Gastrointestinal system: Abdomen is nondistended, soft and nontender. Central nervous system: Alert and oriented Extremities: No edema, no clubbing ,no cyanosis Skin: Sacral decubitus ulcer on the back( not examined because patient was too uncomfortable to roll over)   Data Reviewed: I have personally reviewed following labs and imaging studies  CBC: Recent Labs  Lab 12/09/21 0405 12/10/21 0241 12/10/21 1213 12/11/21 0248 12/13/21 0406  WBC 8.6 9.3 9.4 7.9 8.1  NEUTROABS 6.4 7.0  --  5.3 5.4  HGB 10.0* 9.5* 10.6* 9.3* 9.6*  HCT 29.7* 29.5* 32.3* 28.1* 28.5*  MCV 104.2* 105.7* 106.6* 105.2* 104.0*  PLT 140* 165 190 189 903   Basic Metabolic Panel: Recent Labs  Lab 12/08/21 0423 12/09/21 0405 12/10/21 0241 12/11/21 0248 12/12/21 0239  NA 135 133* 132* 133* 133*  K 3.3* 3.2* 4.2 4.3 4.6  CL 106 106 105 107 104  CO2 23 22 22 22 24   GLUCOSE 118* 113* 118* 89 104*  BUN 21 24* 23 19 18   CREATININE 0.59* 0.64 0.63 0.65 0.72  CALCIUM 7.7* 7.6* 7.8* 8.0* 8.1*  MG  --  1.9 2.0 2.0 1.9   GFR: Estimated Creatinine Clearance: 68.7 mL/min (by C-G formula based on SCr of 0.72 mg/dL). Liver Function Tests: Recent Labs  Lab 12/08/21 0423 12/09/21 0405 12/10/21 0241 12/11/21 0248 12/12/21 0239  AST 47* 44* 49* 41 58*  ALT 44 39 42 37 35  ALKPHOS 53 51 58 64 71  BILITOT 1.0 0.9 0.9 0.7 0.9  PROT 5.2* 5.0* 5.1* 5.0* 5.0*   ALBUMIN 2.1* 2.0* 1.9* 2.0* 2.0*   No results for input(s): LIPASE, AMYLASE in the last 168 hours. No results for input(s): AMMONIA in the last 168 hours. Coagulation Profile: No results for input(s): INR, PROTIME in the last 168 hours. Cardiac Enzymes: Recent Labs  Lab 12/07/21 0310 12/08/21 0423  CKTOTAL 312 143   BNP (last 3 results) No results for input(s): PROBNP in the last 8760 hours. HbA1C: No results for input(s): HGBA1C in the last 72 hours. CBG: Recent Labs  Lab 12/11/21 0844  GLUCAP 90   Lipid Profile: No  results for input(s): CHOL, HDL, LDLCALC, TRIG, CHOLHDL, LDLDIRECT in the last 72 hours. Thyroid Function Tests: No results for input(s): TSH, T4TOTAL, FREET4, T3FREE, THYROIDAB in the last 72 hours. Anemia Panel: No results for input(s): VITAMINB12, FOLATE, FERRITIN, TIBC, IRON, RETICCTPCT in the last 72 hours. Sepsis Labs: No results for input(s): PROCALCITON, LATICACIDVEN in the last 168 hours.  Recent Results (from the past 240 hour(s))  Urine Culture     Status: Abnormal   Collection Time: 12/05/21  1:38 AM   Specimen: Urine, Clean Catch  Result Value Ref Range Status   Specimen Description URINE, CLEAN CATCH  Final   Special Requests   Final    NONE Performed at [facility], [address], [place], [place] [address]    Culture >=100,000 COLONIES/mL ESCHERICHIA COLI (A)  Final   Report Status 12/07/2021 FINAL  Final   Organism ID, Bacteria ESCHERICHIA COLI (A)  Final      Susceptibility   Escherichia coli - MIC*    AMPICILLIN >=32 RESISTANT Resistant     CEFAZOLIN 32 INTERMEDIATE Intermediate     CEFEPIME <=0.12 SENSITIVE Sensitive     CEFTRIAXONE <=0.25 SENSITIVE Sensitive     CIPROFLOXACIN <=0.25 SENSITIVE Sensitive     GENTAMICIN <=1 SENSITIVE Sensitive     IMIPENEM <=0.25 SENSITIVE Sensitive     NITROFURANTOIN <=16 SENSITIVE Sensitive     TRIMETH/SULFA <=20 SENSITIVE Sensitive     AMPICILLIN/SULBACTAM >=32 RESISTANT Resistant      PIP/TAZO 32 INTERMEDIATE Intermediate     * >=100,000 COLONIES/mL ESCHERICHIA COLI  Blood culture (routine x 2)     Status: None   Collection Time: 12/05/21  1:45 AM   Specimen: BLOOD  Result Value Ref Range Status   Specimen Description BLOOD RIGHT ARM  Final   Special Requests   Final    BOTTLES DRAWN AEROBIC AND ANAEROBIC Blood Culture results may not be optimal due to an excessive volume of blood received in culture bottles   Culture   Final    NO GROWTH 5 DAYS Performed at [facility], [place] [address], [place], [place] [address]    Report Status 12/10/2021 FINAL  Final  Resp Panel by RT-PCR (Flu A&B, Covid) Nasopharyngeal Swab     Status: None   Collection Time: 12/05/21  2:30 AM   Specimen: Nasopharyngeal Swab; Nasopharyngeal(NP) swabs in vial transport medium  Result Value Ref Range Status   SARS Coronavirus 2 by RT PCR NEGATIVE NEGATIVE Final    Comment: (NOTE) SARS-CoV-2 target nucleic acids are NOT DETECTED.  The SARS-CoV-2 RNA is generally detectable in upper respiratory specimens during the acute phase of infection. The lowest concentration of SARS-CoV-2 viral copies this assay can detect is 138 copies/mL. A negative result does not preclude SARS-Cov-2 infection and should not be used as the sole basis for treatment or other patient management decisions. A negative result may occur with  improper specimen collection/handling, submission of specimen other than nasopharyngeal swab, presence of viral mutation(s) within the areas targeted by this assay, and inadequate number of viral copies(<138 copies/mL). A negative result must be combined with clinical observations, patient history, and epidemiological information. The expected result is Negative.  Fact Sheet for Patients:  EntrepreneurPulse.com.au  Fact Sheet for Healthcare Providers:  IncredibleEmployment.be  This test is no t yet approved or cleared by the [place]  FDA and  has been authorized for detection and/or diagnosis of SARS-CoV-2 by FDA under an Emergency Use Authorization (EUA). This  EUA will remain  in effect (meaning this test can be used) for the duration of the COVID-19 declaration under Section 564(b)(1) of the Act, 21 U.S.C.section 360bbb-3(b)(1), unless the authorization is terminated  or revoked sooner.       Influenza A by PCR NEGATIVE NEGATIVE Final   Influenza B by PCR NEGATIVE NEGATIVE Final    Comment: (NOTE) The Xpert Xpress SARS-CoV-2/FLU/RSV plus assay is intended as an aid in the diagnosis of influenza from Nasopharyngeal swab specimens and should not be used as a sole basis for treatment. Nasal washings and aspirates are unacceptable for Xpert Xpress SARS-CoV-2/FLU/RSV testing.  Fact Sheet for Patients: EntrepreneurPulse.com.au  Fact Sheet for Healthcare Providers: IncredibleEmployment.be  This test is not yet approved or cleared by the [place] FDA and has been authorized for detection and/or diagnosis of SARS-CoV-2 by FDA under an Emergency Use Authorization (EUA). This EUA will remain in effect (meaning this test can be used) for the duration of the COVID-19 declaration under Section 564(b)(1) of the Act, 21 U.S.C. section 360bbb-3(b)(1), unless the authorization is terminated or revoked.  Performed at [facility], [place] [address], [place], [place] [address]   Blood culture (routine x 2)     Status: None   Collection Time: 12/05/21  8:35 AM   Specimen: BLOOD LEFT FOREARM  Result Value Ref Range Status   Specimen Description BLOOD LEFT FOREARM  Final   Special Requests   Final    BOTTLES DRAWN AEROBIC AND ANAEROBIC Blood Culture results may not be optimal due to an inadequate volume of blood received in culture bottles   Culture   Final    NO GROWTH 5 DAYS Performed at [facility], [place] [address] [place], [place] [address]    Report Status  12/10/2021 FINAL  Final  MRSA Next Gen by PCR, Nasal     Status: None   Collection Time: 12/07/21  7:54 PM   Specimen: Nasal Mucosa; Nasal Swab  Result Value Ref Range Status   MRSA by PCR Next Gen NOT DETECTED NOT DETECTED Final    Comment: (NOTE) The GeneXpert MRSA Assay (FDA approved for NASAL specimens only), is one component of a comprehensive MRSA colonization surveillance program. It is not intended to diagnose MRSA infection nor to guide or monitor treatment for MRSA infections. Test performance is not FDA approved in patients less than 34 years old. Performed at [facility], [place] [address], [place], [place] [address]   Resp Panel by RT-PCR (Flu A&B, Covid) Nasopharyngeal Swab     Status: None   Collection Time: 12/09/21 10:39 AM   Specimen: Nasopharyngeal Swab; Nasopharyngeal(NP) swabs in vial transport medium  Result Value Ref Range Status   SARS Coronavirus 2 by RT PCR NEGATIVE NEGATIVE Final    Comment: (NOTE) SARS-CoV-2 target nucleic acids are NOT DETECTED.  The SARS-CoV-2 RNA is generally detectable in upper respiratory specimens during the acute phase of infection. The lowest concentration of SARS-CoV-2 viral copies this assay can detect is 138 copies/mL. A negative result does not preclude SARS-Cov-2 infection and should not be used as the sole basis for treatment or other patient management decisions. A negative result may occur with  improper specimen collection/handling, submission of specimen other than nasopharyngeal swab, presence of viral mutation(s) within the areas targeted by this assay, and inadequate number of viral copies(<138 copies/mL). A negative result must be combined with clinical observations, patient history, and epidemiological information. The expected result is Negative.  Fact Sheet for Patients:  EntrepreneurPulse.com.au  Fact Sheet for Healthcare Providers:   IncredibleEmployment.be  This test is no t yet approved or cleared by the [place] FDA and  has been authorized for detection and/or diagnosis of SARS-CoV-2 by FDA under an Emergency Use Authorization (EUA). This EUA will remain  in effect (meaning this test can be used) for the duration of the COVID-19 declaration under Section 564(b)(1) of the Act, 21 U.S.C.section 360bbb-3(b)(1), unless the authorization is terminated  or revoked sooner.       Influenza A by PCR NEGATIVE NEGATIVE Final   Influenza B by PCR NEGATIVE NEGATIVE Final    Comment: (NOTE) The Xpert Xpress SARS-CoV-2/FLU/RSV plus assay is intended as an aid in the diagnosis of influenza from Nasopharyngeal swab specimens and should not be used as a sole basis for treatment. Nasal washings and aspirates are unacceptable for Xpert Xpress SARS-CoV-2/FLU/RSV testing.  Fact Sheet for Patients: EntrepreneurPulse.com.au  Fact Sheet for Healthcare Providers: IncredibleEmployment.be  This test is not yet approved or cleared by the [place] FDA and has been authorized for detection and/or diagnosis of SARS-CoV-2 by FDA under an Emergency Use Authorization (EUA). This EUA will remain in effect (meaning this test can be used) for the duration of the COVID-19 declaration under Section 564(b)(1) of the Act, 21 U.S.C. section 360bbb-3(b)(1), unless the authorization is terminated or revoked.  Performed at [facility], [place] [address], [place], [place] [address]          Radiology Studies: No results found.      Scheduled Meds:  amiodarone  200 mg Oral BID   collagenase   Topical Daily   dapsone  50 mg Oral Daily   digoxin  0.125 mg Oral Daily   feeding supplement  237 mL Oral TID BM   levothyroxine  50 mcg Oral Daily   mouth rinse  15 mL Mouth Rinse BID   polyethylene glycol  17 g Oral Daily   senna-docusate  1 tablet Oral BID   sodium  chloride flush  3 mL Intravenous Q12H   Continuous Infusions:   LOS: 8 days    Time spent: 25 mins.More than 50% of that time was spent in counseling and/or coordination of care.      Shelly Coss, MD [facility] P12/25/2022, 8:35 AM

## 2021-12-14 DIAGNOSIS — I4891 Unspecified atrial fibrillation: Secondary | ICD-10-CM | POA: Diagnosis not present

## 2021-12-14 MED ORDER — ACETAMINOPHEN 500 MG PO TABS: 500.0000 mg | ORAL_TABLET | Freq: Four times a day (QID) | ORAL | 0 refills | Status: AC | PRN

## 2021-12-14 MED ORDER — COLLAGENASE 250 UNIT/GM EX OINT: TOPICAL_OINTMENT | Freq: Every day | CUTANEOUS | 0 refills | Status: DC

## 2021-12-14 MED ORDER — ENSURE ENLIVE PO LIQD: 237.0000 mL | Freq: Three times a day (TID) | ORAL | 12 refills | Status: DC

## 2021-12-14 MED ORDER — SENNOSIDES-DOCUSATE SODIUM 8.6-50 MG PO TABS: 1.0000 | ORAL_TABLET | Freq: Two times a day (BID) | ORAL | Status: DC

## 2021-12-14 MED ORDER — AMIODARONE HCL 200 MG PO TABS: 200.0000 mg | ORAL_TABLET | Freq: Every day | ORAL | Status: DC

## 2021-12-14 MED ORDER — POLYETHYLENE GLYCOL 3350 17 G PO PACK: 17.0000 g | PACK | Freq: Every day | ORAL | 0 refills | Status: DC

## 2021-12-14 MED ORDER — DIGOXIN 125 MCG PO TABS: 0.1250 mg | ORAL_TABLET | Freq: Every day | ORAL | Status: DC

## 2021-12-14 MED ORDER — AMIODARONE HCL 200 MG PO TABS
200.0000 mg | ORAL_TABLET | Freq: Every day | ORAL | Status: DC
  Administered 2021-12-15: 10:00:00 200 mg via ORAL
  Filled 2021-12-14: qty 1

## 2021-12-14 NOTE — Evaluation (Signed)
Clinical/Bedside Swallow Evaluation Patient Details  Name: [name] MRN: [id number] Date of Birth: [birth year]  Today's Date: 12/14/2021 Time: SLP Start Time (ACUTE ONLY): 4 SLP Stop Time (ACUTE ONLY): 4 SLP Time Calculation (min) (ACUTE ONLY): 10 min  Past Medical History:  Past Medical History:  Diagnosis Date   Alcohol abuse    Allergic dermatitis    Atrial fibrillation ([place])    Celiac disease    Constipation    CVA (cerebral infarction) 1987   blood clot, right side and speech issues, recovered quickly   DJD (degenerative joint disease)    Macrocytic anemia    Nonspecific (abnormal) findings on radiological and other examination of other intrathoracic organs    Nonspecific abnormal results of thyroid function study    Olecranon bursitis    Prostate cancer ([place])    Pure hypercholesterolemia    Unspecified hemorrhoids without mention of complication    Past Surgical History:  Past Surgical History:  Procedure Laterality Date   COLONOSCOPY     KYPHOPLASTY N/A 07/13/2018   Procedure: KYPHOPLASTY L2, L3, L5;  Surgeon: Melina Schools, MD;  Location: [facility];  Service: Orthopedics;  Laterality: N/A;  120 mins   prostate biopsy and see implantation     WRIST FRACTURE SURGERY     HPI:  Patient is a 87 -year-old male with past medical history of A. fib on Eliquis, CKD stage II, hypothyroidism who was found to be fallen on the floor by his son and was brought to the hospital.  On presentation he was found to be in A. fib with RVR and possible UTI.  On presentation,CK was found to be elevated, lab work showed AKI.  Started on IV fluids, cardiology consulted for A. fib with RVR.  PT/OT recommending skilled nursing facility on discharge. Treated with  hydrotherapy by PT for unstageable sacral wound.    Assessment / Plan / Recommendation  Clinical Impression  Pts son arrived with denture adhesive and dentures finally able to be placed; pt demonstrated adequate mastication as  long as liquid wash available given lack of saliva. Pt does prefer his foods cut up. Will initiate a mech soft diet per his preference with thin liquids. No SLP f/u needed will sign off. SLP Visit Diagnosis: Dysphagia, unspecified (R13.10)    Aspiration Risk  Mild aspiration risk    Diet Recommendation Dysphagia 3 (Mech soft);Thin liquid   Liquid Administration via: Cup;Straw Medication Administration: Whole meds with liquid Supervision: Patient able to self feed Compensations: Follow solids with liquid Postural Changes: Seated upright at 90 degrees    Other  Recommendations Oral Care Recommendations: Oral care BID    Recommendations for follow up therapy are one component of a multi-disciplinary discharge planning process, led by the attending physician.  Recommendations may be updated based on patient status, additional functional criteria and insurance authorization.   Swallow Study   General HPI: Patient is a 87 -year-old male with past medical history of A. fib on Eliquis, CKD stage II, hypothyroidism who was found to be fallen on the floor by his son and was brought to the hospital.  On presentation he was found to be in A. fib with RVR and possible UTI.  On presentation,CK was found to be elevated, lab work showed AKI.  Started on IV fluids, cardiology consulted for A. fib with RVR.  PT/OT recommending skilled nursing facility on discharge. Treated with  hydrotherapy by PT for unstageable sacral wound. Type of Study: Bedside Swallow Evaluation Previous Swallow  Assessment: none Diet Prior to this Study: Dysphagia 1 (puree);Thin liquids Temperature Spikes Noted: No Respiratory Status: Room air History of Recent Intubation: No Behavior/Cognition: Alert;Cooperative;Pleasant mood Oral Cavity Assessment: Within Functional Limits Oral Care Completed by SLP: Yes Oral Cavity - Dentition: Dentures, top;Dentures, bottom Vision: Functional for self-feeding Self-Feeding Abilities: Able to  feed self Patient Positioning: Upright in bed Baseline Vocal Quality: Normal Volitional Cough: Strong Volitional Swallow: Able to elicit    Oral/Motor/Sensory Function Overall Oral Motor/Sensory Function: Within functional limits   Ice Chips     Thin Liquid Thin Liquid: Within functional limits    Nectar Thick Nectar Thick Liquid: Not tested   Honey Thick Honey Thick Liquid: Not tested   Puree Puree: Within functional limits   Solid     Solid: Impaired Presentation: Self Fed Oral Phase Impairments: Other (comment) (decreased saliva, needs liquid wash)      Glyn Zendejas, Katherene Ponto 12/14/2021,10:58 AM

## 2021-12-14 NOTE — Progress Notes (Signed)
Progress Note  Patient Name: [name] Date of Encounter: 12/14/2021  [facility] Cardiologist: Kirk Ruths, MD   Subjective   Breathing is OK  NO CP   Inpatient Medications    Scheduled Meds:  amiodarone  200 mg Oral BID   apixaban  5 mg Oral BID   collagenase   Topical Daily   dapsone  50 mg Oral Daily   digoxin  0.125 mg Oral Daily   feeding supplement  237 mL Oral TID BM   levothyroxine  50 mcg Oral Daily   mouth rinse  15 mL Mouth Rinse BID   polyethylene glycol  17 g Oral Daily   senna-docusate  1 tablet Oral BID   sodium chloride flush  3 mL Intravenous Q12H   Continuous Infusions:   Vital Signs    Vitals:   12/12/21 0414 12/12/21 0800 12/13/21 1234 12/14/21 0755  BP:  100/79 97/70 115/64  Pulse:  100 92 94  Resp:  16 20 (!) 21  Temp:  98.5 F (36.9 C) 98 F (36.7 C) 98.4 F (36.9 C)  TempSrc:  Oral Oral Oral  SpO2:   94% 95%  Weight: 83 kg     Height:       No intake or output data in the 24 hours ending 12/14/21 0925 Last 3 Weights 12/12/2021 12/09/2021 12/06/2021  Weight (lbs) 182 lb 15.7 oz 171 lb 15.3 oz 155 lb 3.3 oz  Weight (kg) 83 kg 78 kg 70.4 kg      Telemetry    Atrial fibrillation 80s - personally reviewed  Physical Exam   GEN: Well nourished, well developed, in no acute distress  HEENT: normal  Neck: JVP is not elevated   Cardiac: irregularly irreg   No S3  no murmurs Trivial LE edema  Respiratory:  clear to auscultation bilaterally, normal work of breathing GI: soft, nontender, nondistended, + BS MS: no deformity or atrophy  Skin: warm and dry Neuro:  Strength and sensation are intact Psych: euthymic mood, full affect   Labs     Chemistry Recent Labs  Lab 12/10/21 0241 12/11/21 0248 12/12/21 0239  NA 132* 133* 133*  K 4.2 4.3 4.6  CL 105 107 104  CO2 22 22 24   GLUCOSE 118* 89 104*  BUN 23 19 18   CREATININE 0.63 0.65 0.72  CALCIUM 7.8* 8.0* 8.1*  MG 2.0 2.0 1.9  PROT 5.1* 5.0* 5.0*  ALBUMIN 1.9*  2.0* 2.0*  AST 49* 41 58*  ALT 42 37 35  ALKPHOS 58 64 71  BILITOT 0.9 0.7 0.9  GFRNONAA >60 >60 >60  ANIONGAP 5 4* 5   Hematology Recent Labs  Lab 12/10/21 1213 12/11/21 0248 12/13/21 0406  WBC 9.4 7.9 8.1  RBC 3.03* 2.67* 2.74*  HGB 10.6* 9.3* 9.6*  HCT 32.3* 28.1* 28.5*  MCV 106.6* 105.2* 104.0*  MCH 35.0* 34.8* 35.0*  MCHC 32.8 33.1 33.7  RDW 15.5 15.4 15.3  PLT 190 189 250   Thyroid  No results for input(s): TSH, FREET4 in the last 168 hours.   TTE 04/2021: IMPRESSIONS   1. Left ventricular ejection fraction, by estimation, is 60 to 65%. The  left ventricle has normal function. The left ventricle has no regional  wall motion abnormalities. Left ventricular diastolic parameters are  indeterminate.   2. Right ventricular systolic function is normal. The right ventricular  size is mildly enlarged. There is mildly elevated pulmonary artery  systolic pressure.   3. Left atrial  size was severely dilated.   4. Right atrial size was mildly dilated.   5. The mitral valve is normal in structure. Mild mitral valve  regurgitation. No evidence of mitral stenosis.   6. Tricuspid valve regurgitation is severe.   7. The aortic valve is tricuspid. There is moderate calcification of the  aortic valve. There is moderate thickening of the aortic valve. Aortic  valve regurgitation is not visualized. Mild to moderate aortic valve  sclerosis/calcification is present,  without any evidence of aortic stenosis.   8. There is mild dilatation of the aortic root, measuring 40 mm.   9. The inferior vena cava is dilated in size with >50% respiratory  variability, suggesting right atrial pressure of 8 mmHg.   Patient Profile     87 y.o. male with history of persistent Afib, prior CVA, hypothyroidism, and mild TAA who presented after a mechanical fall where he was down for 2 days found to have rhabdomyolysis and Afib with RVR. Cardiology is consulted regarding Afib with RVR.  Assessment &  Plan    Permanent atrial fibrillation  Pt was tachycardiic on admit   Amiodarone used for rate control given low BPs   Plan to continue for 200 bid for 4 day then 200 daily .  Patient is also on digoxin   This will need to be followed closely with Digoxin level in 1 wk   Will check today   D/c bp improves and get back to metoprolol  (he was only on 25 mg at home )As BP improves can be d/c'd and continued on metoprolol as he had been prior to admit    Eliquis restarted  Will need close follow up of Hgb.     2. Rhabdomyolysis: post fall.   Recoveriong  Renal function is good   For questions or updates, please contact [place] Please consult [email or web address] for contact info under        Signed, Dorris Carnes, MD  12/14/2021, 9:25 AM

## 2021-12-14 NOTE — Discharge Summary (Addendum)
Physician Discharge Summary  [name] FXT:[id number] DOB: January 25, 1930 DOA: 12/05/2021  PCP: [name], MD  Admit date: 12/05/2021 Discharge date: 12/15/2021  Admitted From: Home Disposition:  Home  Discharge Condition:Stable CODE STATUS:FULL Diet recommendation:Dysphagia 3  Brief/Interim Summary:  Patient is a 87 -year-old male with past medical history of A. fib on Eliquis, CKD stage II, hypothyroidism who was found to be fallen on the floor by his son and was brought to the hospital.  On presentation he was found to be in A. fib with RVR and possible UTI.  On presentation,CK was found to be elevated, lab work showed AKI.  Started on IV fluids, cardiology consulted for A. fib with RVR.  PT/OT recommending skilled nursing facility on discharge. Treated with  hydrotherapy by PT for unstageable sacral wound.  Hemodynamically stable for discharge to skilled nursing facility whenever possible.  Following problems were addressed during hospitalization:  Fall/rhabdomyolysis/AKI on CKD stage II: Found to be on the floor by son at home.  On presentation, creatinine was elevated, elevated CK.  Started on IV fluids.  IV fluid has been discontinued   A. fib with RVR: Noted to be in A. fib with RVR on presentation.  Cardiology was consulted.  Currently on amiodarone.  Also on digoxin.  Beta-blockers discontinued, because of soft blood pressure, on Eliquis for anticoagulation.  Currently rate is controlled.  Check digoxin level in a week.   Rectal bleeding: resolved.  Hemoglobin currently stable in the range of 9.  Has history of internal hemorrhoids as seen on the colonoscopy in 2011.    Bleeding from decubitus ulcer:Treated with hydrotherapy.   Sepsis due to UTI: Presented with tachycardia, hypotension, hypothermia, lactic stenosis, AKI.  Urine culture showed E. coli.  Treated with ceftriaxone, completed course.  Sepsis physiology has resolved.   Right knee pain: Most likely from  osteoarthritis.  X-ray showed degenerative changes with a small joint effusion, no acute fracture or dislocation.  Continue supportive care   History of mitral/tricuspid regurgitation: Severe TR and mild MR was noted on echo on May 2022.  He will follow-up with cardiology as an outpatient   Hypothyroidism: Continue Synthroid   Hypokalemia: Supplemented and corrected   Hyponatremia: Mild.    Continue to monitor as an outpatient   Debility/deconditioning: PT/OT recommended skilled NF discharge      Discharge Diagnoses:  Principal Problem:   Rhabdomyolysis Active Problems:   Macrocytic anemia   Hypothyroidism   Atrial fibrillation with RVR (HCC)   Mitral regurgitation   Acute renal failure superimposed on stage 2 chronic kidney disease (HCC)   Metabolic acidosis   Hypothermia   Pressure injury of skin    Discharge Instructions  Discharge Instructions     Diet general   Complete by: As directed    Dysphagia 3   Discharge instructions   Complete by: As directed    1)Please take prescribed medication as instructed 2)Check digoxin level in a week 3)Monitor your blood pressure   Discharge wound care:   Complete by: As directed    As per wound care   Discharge wound care:   Complete by: As directed    As per wound care   Increase activity slowly   Complete by: As directed       Allergies as of 12/15/2021   No Known Allergies      Medication List     STOP taking these medications    furosemide 20 MG tablet Commonly known as: LASIX  metoprolol succinate 25 MG 24 hr tablet Commonly known as: TOPROL-XL       TAKE these medications    acetaminophen 500 MG tablet Commonly known as: TYLENOL Take 1 tablet (500 mg total) by mouth every 6 (six) hours as needed.   amiodarone 200 MG tablet Commonly known as: PACERONE Take 1 tablet (200 mg total) by mouth daily.   atorvastatin 20 MG tablet Commonly known as: LIPITOR TAKE 1 TABLET BY MOUTH EVERY DAY    collagenase ointment Commonly known as: SANTYL Apply topically daily.   cyanocobalamin 1000 MCG tablet Take 1,000 mcg by mouth daily.   dapsone 100 MG tablet Take 0.5 tablets (50 mg total) by mouth daily.   digoxin 0.125 MG tablet Commonly known as: LANOXIN Take 1 tablet (0.125 mg total) by mouth daily.   Eliquis 5 MG Tabs tablet Generic drug: apixaban Take 1 tablet (5 mg total) by mouth 2 (two) times daily.   feeding supplement Liqd Take 237 mLs by mouth 3 (three) times daily between meals.   levothyroxine 50 MCG tablet Commonly known as: SYNTHROID TAKE 1 TABLET BY MOUTH EVERY DAY What changed: when to take this   polyethylene glycol 17 g packet Commonly known as: MIRALAX / GLYCOLAX Take 17 g by mouth daily.   senna-docusate 8.6-50 MG tablet Commonly known as: Senokot-S Take 1 tablet by mouth 2 (two) times daily.               Discharge Care Instructions  (From admission, onward)           Start     Ordered   12/14/21 0000  Discharge wound care:       Comments: As per wound care   12/14/21 1132   12/14/21 0000  Discharge wound care:       Comments: As per wound care   12/14/21 1133            Contact information for after-discharge care     Destination     [facility] .   Service: Skilled Nursing Contact information: [address] [place] [phone]                    No Known Allergies  Consultations: Cardiology   Procedures/Studies: DG Chest 1 View  Result Date: 12/05/2021 CLINICAL DATA:  Fall, chest pain EXAM: CHEST  1 VIEW COMPARISON:  07/20/2013 FINDINGS: Lungs are clear. No pneumothorax or pleural effusion. Cardiac size is within normal limits when accounting for supine positioning. Pulmonary vascularity is normal. Multiple thoracic compression deformities are noted. IMPRESSION: Multiple thoracic compression deformities, age indeterminate. No radiographic evidence of  acute cardiopulmonary disease. Electronically Signed   By: Fidela Salisbury M.D.   On: 12/05/2021 03:11   DG Lumbar Spine Complete  Result Date: 12/05/2021 CLINICAL DATA:  Fall, left hip bruising, low back injury EXAM: LUMBAR SPINE - COMPLETE 4+ VIEW COMPARISON:  MRI 06/27/2018 FINDINGS: The osseous structures are diffusely markedly osteopenic. Interval kyphoplasty of L2, L3, and L5. There is interval development of moderate to severe anterior wedge compression fractures of T11, T12, and L1, likely remote in nature given the lack of associated paravertebral soft tissue swelling. No listhesis. The paraspinal soft tissues are unremarkable save for vascular calcifications within the abdominal aorta. IMPRESSION: Multiple insufficiency fractures of the thoracolumbar spine, as described above, likely remote in nature. No definite acute fracture or listhesis. Severe osteopenia. Electronically Signed   By: Linwood Dibbles.D.  On: 12/05/2021 03:05   DG Elbow Complete Left  Result Date: 12/05/2021 CLINICAL DATA:  Fall, left elbow pain EXAM: LEFT ELBOW - COMPLETE 3+ VIEW COMPARISON:  None. FINDINGS: Normal alignment. No acute fracture or dislocation. Mild chondrocalcinosis noted, likely degenerative in nature. Obliquity on lateral examination precludes evaluation for an effusion. Mild soft tissue swelling posterior to the olecranon. IMPRESSION: Soft tissue swelling.  No acute fracture or dislocation. Electronically Signed   By: Fidela Salisbury M.D.   On: 12/05/2021 03:08   DG Knee 1-2 Views Right  Result Date: 12/08/2021 CLINICAL DATA:  Right knee pain following fall several days ago, initial encounter EXAM: RIGHT KNEE - 2 VIEW COMPARISON:  None. FINDINGS: Small joint effusion is noted. Degenerative changes in the patellofemoral space are seen. No acute fracture or dislocation is noted. Diffuse meniscal calcifications and vascular calcifications are seen. IMPRESSION: Degenerative change and small joint effusion.  No acute bony abnormality noted. Electronically Signed   By: Inez Catalina M.D.   On: 12/08/2021 15:29   CT HEAD WO CONTRAST (5MM)  Result Date: 12/05/2021 CLINICAL DATA:  Fall EXAM: CT HEAD WITHOUT CONTRAST CT CERVICAL SPINE WITHOUT CONTRAST TECHNIQUE: Multidetector CT imaging of the head and cervical spine was performed following the standard protocol without intravenous contrast. Multiplanar CT image reconstructions of the cervical spine were also generated. COMPARISON:  CT head 06/26/2009, no prior CT of the cervical spine. FINDINGS: CT HEAD FINDINGS Brain: No evidence of acute infarction, hemorrhage, cerebral edema, mass, mass effect, or midline shift. No hydrocephalus or extra-axial fluid collection. Periventricular white matter changes, likely the sequela of chronic small vessel ischemic disease. Ventricles and sulci are commensurate in size. Vascular: No hyperdense vessel. Skull: Normal. Negative for fracture or focal lesion. Sinuses/Orbits: No acute finding. Status post bilateral lens replacements. Other: The mastoid air cells are well aerated. CT CERVICAL SPINE FINDINGS Alignment: No listhesis. Skull base and vertebrae: No acute fracture or suspicious osseous lesion. Osteopenia. Soft tissues and spinal canal: No prevertebral fluid or swelling. No visible canal hematoma. Disc levels: Multilevel degenerative changes, without significant spinal canal stenosis. Multilevel facet and uncovertebral hypertrophy, which causes severe bilateral neural foraminal narrowing at C5-C6 and C6-C7. Upper chest: Negative. Other: None. IMPRESSION: 1.  No acute intracranial process. 2.  No acute fracture or traumatic listhesis in the cervical spine. Electronically Signed   By: Merilyn Baba M.D.   On: 12/05/2021 02:37   CT Cervical Spine Wo Contrast  Result Date: 12/05/2021 CLINICAL DATA:  Fall EXAM: CT HEAD WITHOUT CONTRAST CT CERVICAL SPINE WITHOUT CONTRAST TECHNIQUE: Multidetector CT imaging of the head and cervical  spine was performed following the standard protocol without intravenous contrast. Multiplanar CT image reconstructions of the cervical spine were also generated. COMPARISON:  CT head 06/26/2009, no prior CT of the cervical spine. FINDINGS: CT HEAD FINDINGS Brain: No evidence of acute infarction, hemorrhage, cerebral edema, mass, mass effect, or midline shift. No hydrocephalus or extra-axial fluid collection. Periventricular white matter changes, likely the sequela of chronic small vessel ischemic disease. Ventricles and sulci are commensurate in size. Vascular: No hyperdense vessel. Skull: Normal. Negative for fracture or focal lesion. Sinuses/Orbits: No acute finding. Status post bilateral lens replacements. Other: The mastoid air cells are well aerated. CT CERVICAL SPINE FINDINGS Alignment: No listhesis. Skull base and vertebrae: No acute fracture or suspicious osseous lesion. Osteopenia. Soft tissues and spinal canal: No prevertebral fluid or swelling. No visible canal hematoma. Disc levels: Multilevel degenerative changes, without significant spinal canal stenosis. Multilevel facet  and uncovertebral hypertrophy, which causes severe bilateral neural foraminal narrowing at C5-C6 and C6-C7. Upper chest: Negative. Other: None. IMPRESSION: 1.  No acute intracranial process. 2.  No acute fracture or traumatic listhesis in the cervical spine. Electronically Signed   By: Merilyn Baba M.D.   On: 12/05/2021 02:37   ECHOCARDIOGRAM COMPLETE  Result Date: 12/07/2021    ECHOCARDIOGRAM REPORT   Patient Name:   [name] Date of Exam: 12/07/2021 Medical Rec #:  [id number]       Height:       72.0 in Accession #:    [id number]      Weight:       155.2 lb Date of Birth:  1930-06-14        BSA:          1.913 m Patient Age:    21 years        BP:           113/68 mmHg Patient Gender: M               HR:           94 bpm. Exam Location:  Inpatient Procedure: 2D Echo, Color Doppler and Cardiac Doppler Indications:    Afib   History:        Patient has prior history of Echocardiogram examinations.                 Arrythmias:Atrial Fibrillation.  Sonographer:    Jyl Heinz Referring Phys: Dry Prong  1. Left ventricular ejection fraction, by estimation, is 55 to 60%. The left ventricle has normal function. The left ventricle has no regional wall motion abnormalities. There is mild left ventricular hypertrophy. Left ventricular diastolic parameters are indeterminate.  2. Right ventricular systolic function is mildly reduced. The right ventricular size is normal. There is mildly elevated pulmonary artery systolic pressure. The estimated right ventricular systolic pressure is 53.6 mmHg.  3. Left atrial size was mildly dilated.  4. The mitral valve is degenerative. Mild mitral valve regurgitation. No evidence of mitral stenosis. Moderate mitral annular calcification.  5. The tricuspid valve is abnormal. Tricuspid valve regurgitation is moderate to severe.  6. The aortic valve is tricuspid. Aortic valve regurgitation is not visualized. Aortic valve sclerosis/calcification is present, without any evidence of aortic stenosis.  7. The inferior vena cava is dilated in size with <50% respiratory variability, suggesting right atrial pressure of 15 mmHg. FINDINGS  Left Ventricle: Left ventricular ejection fraction, by estimation, is 55 to 60%. The left ventricle has normal function. The left ventricle has no regional wall motion abnormalities. The left ventricular internal cavity size was normal in size. There is  mild left ventricular hypertrophy. Left ventricular diastolic parameters are indeterminate. Right Ventricle: The right ventricular size is normal. Right vetricular wall thickness was not well visualized. Right ventricular systolic function is mildly reduced. There is mildly elevated pulmonary artery systolic pressure. The tricuspid regurgitant velocity is 2.66 m/s, and with an assumed right atrial pressure of 15  mmHg, the estimated right ventricular systolic pressure is 46.8 mmHg. Left Atrium: Left atrial size was mildly dilated. Right Atrium: Right atrial size was normal in size. Pericardium: Trivial pericardial effusion is present. Mitral Valve: The mitral valve is degenerative in appearance. Moderate mitral annular calcification. Mild mitral valve regurgitation. No evidence of mitral valve stenosis. Tricuspid Valve: The tricuspid valve is abnormal. Tricuspid valve regurgitation is moderate to severe. Aortic Valve: The aortic valve is tricuspid.  Aortic valve regurgitation is not visualized. Aortic valve sclerosis/calcification is present, without any evidence of aortic stenosis. Aortic valve peak gradient measures 6.9 mmHg. Pulmonic Valve: The pulmonic valve was grossly normal. Pulmonic valve regurgitation is trivial. Aorta: The aortic root and ascending aorta are structurally normal, with no evidence of dilitation. Venous: The inferior vena cava is dilated in size with less than 50% respiratory variability, suggesting right atrial pressure of 15 mmHg. IAS/Shunts: No atrial level shunt detected by color flow Doppler.  LEFT VENTRICLE PLAX 2D LVIDd:         3.90 cm     Diastology LVIDs:         3.00 cm     LV e' medial:    7.40 cm/s LV PW:         1.20 cm     LV E/e' medial:  16.1 LV IVS:        1.10 cm     LV e' lateral:   12.20 cm/s LVOT diam:     2.10 cm     LV E/e' lateral: 9.8 LV SV:         32 LV SV Index:   17 LVOT Area:     3.46 cm  LV Volumes (MOD) LV vol d, MOD A2C: 63.3 ml LV vol d, MOD A4C: 50.8 ml LV vol s, MOD A2C: 27.0 ml LV vol s, MOD A4C: 24.9 ml LV SV MOD A2C:     36.3 ml LV SV MOD A4C:     50.8 ml LV SV MOD BP:      30.4 ml RIGHT VENTRICLE            IVC RV Basal diam:  3.30 cm    IVC diam: 2.60 cm RV Mid diam:    2.40 cm RV S prime:     9.68 cm/s TAPSE (M-mode): 1.4 cm LEFT ATRIUM             Index        RIGHT ATRIUM           Index LA diam:        5.00 cm 2.61 cm/m   RA Area:     18.30 cm LA Vol  (A2C):   69.5 ml 36.33 ml/m  RA Volume:   45.50 ml  23.79 ml/m LA Vol (A4C):   69.0 ml 36.07 ml/m LA Biplane Vol: 70.7 ml 36.96 ml/m  AORTIC VALVE AV Area (Vmax): 1.58 cm AV Vmax:        131.00 cm/s AV Peak Grad:   6.9 mmHg LVOT Vmax:      59.90 cm/s LVOT Vmean:     44.800 cm/s LVOT VTI:       0.092 m  AORTA Ao Root diam: 3.60 cm Ao Asc diam:  3.40 cm MITRAL VALVE                TRICUSPID VALVE MV Area (PHT): 5.09 cm     TR Peak grad:   28.3 mmHg MV Decel Time: 149 msec     TR Vmax:        266.00 cm/s MR Peak grad: 74.3 mmHg MR Mean grad: 59.0 mmHg     SHUNTS MR Vmax:      431.00 cm/s   Systemic VTI:  0.09 m MR Vmean:     369.0 cm/s    Systemic Diam: 2.10 cm MV E velocity: 119.00 cm/s Oswaldo Milian MD Electronically signed by Oswaldo Milian MD Signature Date/Time:  12/07/2021/1:18:00 PM    Final    DG HIP UNILAT W OR W/O PELVIS 2-3 VIEWS LEFT  Result Date: 12/05/2021 CLINICAL DATA:  Fall, left hip pain EXAM: DG HIP (WITH OR WITHOUT PELVIS) 2-3V LEFT COMPARISON:  None. FINDINGS: The osseous structures are are osteopenic. No acute fracture or dislocation. Hip joint spaces appear preserved. Vascular calcifications are seen within the pelvis and medial left thigh. Brachytherapy seeds are seen within the expected prostate gland. IMPRESSION: No acute fracture or dislocation. Osteopenia. Electronically Signed   By: Fidela Salisbury M.D.   On: 12/05/2021 03:07      Subjective: Patient seen and examined at the bedside this morning.  Hemodynamically stable for discharge soon as possible.  No new complaints  Discharge Exam: Vitals:   12/14/21 1454 12/14/21 2128  BP: 94/63 111/63  Pulse: 99 99  Resp: 20 19  Temp: 97.9 F (36.6 C) 98.1 F (36.7 C)  SpO2: 95% 95%   Vitals:   12/13/21 1234 12/14/21 0755 12/14/21 1454 12/14/21 2128  BP: 97/70 115/64 94/63 111/63  Pulse: 92 94 99 99  Resp: 20 (!) 21 20 19   Temp: 98 F (36.7 C) 98.4 F (36.9 C) 97.9 F (36.6 C) 98.1 F (36.7 C)   TempSrc: Oral Oral Oral Oral  SpO2: 94% 95% 95% 95%  Weight:      Height:        General: Pt is alert, awake, not in acute distress,very deconditioned Cardiovascular: RRR, S1/S2 +, no rubs, no gallops Respiratory: CTA bilaterally, no wheezing, no rhonchi Abdominal: Soft, NT, ND, bowel sounds + Extremities: no edema, no cyanosis,sacral pressure ulcer    The results of significant diagnostics from this hospitalization (including imaging, microbiology, ancillary and laboratory) are listed below for reference.     Microbiology: Recent Results (from the past 240 hour(s))  MRSA Next Gen by PCR, Nasal     Status: None   Collection Time: 12/07/21  7:54 PM   Specimen: Nasal Mucosa; Nasal Swab  Result Value Ref Range Status   MRSA by PCR Next Gen NOT DETECTED NOT DETECTED Final    Comment: (NOTE) The GeneXpert MRSA Assay (FDA approved for NASAL specimens only), is one component of a comprehensive MRSA colonization surveillance program. It is not intended to diagnose MRSA infection nor to guide or monitor treatment for MRSA infections. Test performance is not FDA approved in patients less than 51 years old. Performed at [facility], [place] [address] [place], [place] [address]   Resp Panel by RT-PCR (Flu A&B, Covid) Nasopharyngeal Swab     Status: None   Collection Time: 12/09/21 10:39 AM   Specimen: Nasopharyngeal Swab; Nasopharyngeal(NP) swabs in vial transport medium  Result Value Ref Range Status   SARS Coronavirus 2 by RT PCR NEGATIVE NEGATIVE Final    Comment: (NOTE) SARS-CoV-2 target nucleic acids are NOT DETECTED.  The SARS-CoV-2 RNA is generally detectable in upper respiratory specimens during the acute phase of infection. The lowest concentration of SARS-CoV-2 viral copies this assay can detect is 138 copies/mL. A negative result does not preclude SARS-Cov-2 infection and should not be used as the sole basis for treatment or other patient management  decisions. A negative result may occur with  improper specimen collection/handling, submission of specimen other than nasopharyngeal swab, presence of viral mutation(s) within the areas targeted by this assay, and inadequate number of viral copies(<138 copies/mL). A negative result must be combined with clinical observations, patient history, and epidemiological information. The expected result  is Negative.  Fact Sheet for Patients:  EntrepreneurPulse.com.au  Fact Sheet for Healthcare Providers:  IncredibleEmployment.be  This test is no t yet approved or cleared by the [place] FDA and  has been authorized for detection and/or diagnosis of SARS-CoV-2 by FDA under an Emergency Use Authorization (EUA). This EUA will remain  in effect (meaning this test can be used) for the duration of the COVID-19 declaration under Section 564(b)(1) of the Act, 21 U.S.C.section 360bbb-3(b)(1), unless the authorization is terminated  or revoked sooner.       Influenza A by PCR NEGATIVE NEGATIVE Final   Influenza B by PCR NEGATIVE NEGATIVE Final    Comment: (NOTE) The Xpert Xpress SARS-CoV-2/FLU/RSV plus assay is intended as an aid in the diagnosis of influenza from Nasopharyngeal swab specimens and should not be used as a sole basis for treatment. Nasal washings and aspirates are unacceptable for Xpert Xpress SARS-CoV-2/FLU/RSV testing.  Fact Sheet for Patients: EntrepreneurPulse.com.au  Fact Sheet for Healthcare Providers: IncredibleEmployment.be  This test is not yet approved or cleared by the [place] FDA and has been authorized for detection and/or diagnosis of SARS-CoV-2 by FDA under an Emergency Use Authorization (EUA). This EUA will remain in effect (meaning this test can be used) for the duration of the COVID-19 declaration under Section 564(b)(1) of the Act, 21 U.S.C. section 360bbb-3(b)(1), unless the  authorization is terminated or revoked.  Performed at [facility], [place] [address] [place], [place] [address]   Resp Panel by RT-PCR (Flu A&B, Covid) Nasopharyngeal Swab     Status: None   Collection Time: 12/13/21 11:20 AM   Specimen: Nasopharyngeal Swab; Nasopharyngeal(NP) swabs in vial transport medium  Result Value Ref Range Status   SARS Coronavirus 2 by RT PCR NEGATIVE NEGATIVE Final    Comment: (NOTE) SARS-CoV-2 target nucleic acids are NOT DETECTED.  The SARS-CoV-2 RNA is generally detectable in upper respiratory specimens during the acute phase of infection. The lowest concentration of SARS-CoV-2 viral copies this assay can detect is 138 copies/mL. A negative result does not preclude SARS-Cov-2 infection and should not be used as the sole basis for treatment or other patient management decisions. A negative result may occur with  improper specimen collection/handling, submission of specimen other than nasopharyngeal swab, presence of viral mutation(s) within the areas targeted by this assay, and inadequate number of viral copies(<138 copies/mL). A negative result must be combined with clinical observations, patient history, and epidemiological information. The expected result is Negative.  Fact Sheet for Patients:  EntrepreneurPulse.com.au  Fact Sheet for Healthcare Providers:  IncredibleEmployment.be  This test is no t yet approved or cleared by the [place] FDA and  has been authorized for detection and/or diagnosis of SARS-CoV-2 by FDA under an Emergency Use Authorization (EUA). This EUA will remain  in effect (meaning this test can be used) for the duration of the COVID-19 declaration under Section 564(b)(1) of the Act, 21 U.S.C.section 360bbb-3(b)(1), unless the authorization is terminated  or revoked sooner.       Influenza A by PCR NEGATIVE NEGATIVE Final   Influenza B by PCR NEGATIVE NEGATIVE Final     Comment: (NOTE) The Xpert Xpress SARS-CoV-2/FLU/RSV plus assay is intended as an aid in the diagnosis of influenza from Nasopharyngeal swab specimens and should not be used as a sole basis for treatment. Nasal washings and aspirates are unacceptable for Xpert Xpress SARS-CoV-2/FLU/RSV testing.  Fact Sheet for Patients: EntrepreneurPulse.com.au  Fact Sheet for Healthcare Providers: IncredibleEmployment.be  This test  is not yet approved or cleared by the [place] and has been authorized for detection and/or diagnosis of SARS-CoV-2 by FDA under an Emergency Use Authorization (EUA). This EUA will remain in effect (meaning this test can be used) for the duration of the COVID-19 declaration under Section 564(b)(1) of the Act, 21 U.S.C. section 360bbb-3(b)(1), unless the authorization is terminated or revoked.  Performed at [facility], [place] [address], [place], [place] [address]      Labs: BNP (last 3 results) No results for input(s): BNP in the last 8760 hours. Basic Metabolic Panel: Recent Labs  Lab 12/09/21 0405 12/10/21 0241 12/11/21 0248 12/12/21 0239  NA 133* 132* 133* 133*  K 3.2* 4.2 4.3 4.6  CL 106 105 107 104  CO2 22 22 22 24   GLUCOSE 113* 118* 89 104*  BUN 24* 23 19 18   CREATININE 0.64 0.63 0.65 0.72  CALCIUM 7.6* 7.8* 8.0* 8.1*  MG 1.9 2.0 2.0 1.9   Liver Function Tests: Recent Labs  Lab 12/09/21 0405 12/10/21 0241 12/11/21 0248 12/12/21 0239  AST 44* 49* 41 58*  ALT 39 42 37 35  ALKPHOS 51 58 64 71  BILITOT 0.9 0.9 0.7 0.9  PROT 5.0* 5.1* 5.0* 5.0*  ALBUMIN 2.0* 1.9* 2.0* 2.0*   No results for input(s): LIPASE, AMYLASE in the last 168 hours. No results for input(s): AMMONIA in the last 168 hours. CBC: Recent Labs  Lab 12/09/21 0405 12/10/21 0241 12/10/21 1213 12/11/21 0248 12/13/21 0406  WBC 8.6 9.3 9.4 7.9 8.1  NEUTROABS 6.4 7.0  --  5.3 5.4  HGB 10.0* 9.5* 10.6* 9.3* 9.6*  HCT 29.7*  29.5* 32.3* 28.1* 28.5*  MCV 104.2* 105.7* 106.6* 105.2* 104.0*  PLT 140* 165 190 189 250   Cardiac Enzymes: No results for input(s): CKTOTAL, CKMB, CKMBINDEX, TROPONINI in the last 168 hours.  BNP: Invalid input(s): POCBNP CBG: Recent Labs  Lab 12/11/21 0844  GLUCAP 90   D-Dimer No results for input(s): DDIMER in the last 72 hours. Hgb A1c No results for input(s): HGBA1C in the last 72 hours. Lipid Profile No results for input(s): CHOL, HDL, LDLCALC, TRIG, CHOLHDL, LDLDIRECT in the last 72 hours. Thyroid function studies No results for input(s): TSH, T4TOTAL, T3FREE, THYROIDAB in the last 72 hours.  Invalid input(s): FREET3 Anemia work up No results for input(s): VITAMINB12, FOLATE, FERRITIN, TIBC, IRON, RETICCTPCT in the last 72 hours. Urinalysis    Component Value Date/Time   COLORURINE YELLOW 12/05/2021 0548   APPEARANCEUR CLOUDY (A) 12/05/2021 0548   LABSPEC 1.025 12/05/2021 0548   PHURINE 5.5 12/05/2021 0548   GLUCOSEU NEGATIVE 12/05/2021 0548   GLUCOSEU NEGATIVE 11/25/2020 1221   HGBUR LARGE (A) 12/05/2021 0548   BILIRUBINUR NEGATIVE 12/05/2021 0548   KETONESUR 40 (A) 12/05/2021 0548   PROTEINUR 30 (A) 12/05/2021 0548   UROBILINOGEN 1.0 11/25/2020 1221   NITRITE POSITIVE (A) 12/05/2021 0548   LEUKOCYTESUR MODERATE (A) 12/05/2021 0548   Sepsis Labs Invalid input(s): PROCALCITONIN,  WBC,  LACTICIDVEN Microbiology Recent Results (from the past 240 hour(s))  MRSA Next Gen by PCR, Nasal     Status: None   Collection Time: 12/07/21  7:54 PM   Specimen: Nasal Mucosa; Nasal Swab  Result Value Ref Range Status   MRSA by PCR Next Gen NOT DETECTED NOT DETECTED Final    Comment: (NOTE) The GeneXpert MRSA Assay (FDA approved for NASAL specimens only), is one component of a comprehensive MRSA colonization surveillance program. It is not  intended to diagnose MRSA infection nor to guide or monitor treatment for MRSA infections. Test performance is not FDA approved in  patients less than 60 years old. Performed at [facility], [place] [address] [place], [place] [address]   Resp Panel by RT-PCR (Flu A&B, Covid) Nasopharyngeal Swab     Status: None   Collection Time: 12/09/21 10:39 AM   Specimen: Nasopharyngeal Swab; Nasopharyngeal(NP) swabs in vial transport medium  Result Value Ref Range Status   SARS Coronavirus 2 by RT PCR NEGATIVE NEGATIVE Final    Comment: (NOTE) SARS-CoV-2 target nucleic acids are NOT DETECTED.  The SARS-CoV-2 RNA is generally detectable in upper respiratory specimens during the acute phase of infection. The lowest concentration of SARS-CoV-2 viral copies this assay can detect is 138 copies/mL. A negative result does not preclude SARS-Cov-2 infection and should not be used as the sole basis for treatment or other patient management decisions. A negative result may occur with  improper specimen collection/handling, submission of specimen other than nasopharyngeal swab, presence of viral mutation(s) within the areas targeted by this assay, and inadequate number of viral copies(<138 copies/mL). A negative result must be combined with clinical observations, patient history, and epidemiological information. The expected result is Negative.  Fact Sheet for Patients:  EntrepreneurPulse.com.au  Fact Sheet for Healthcare Providers:  IncredibleEmployment.be  This test is no t yet approved or cleared by the [place] FDA and  has been authorized for detection and/or diagnosis of SARS-CoV-2 by FDA under an Emergency Use Authorization (EUA). This EUA will remain  in effect (meaning this test can be used) for the duration of the COVID-19 declaration under Section 564(b)(1) of the Act, 21 U.S.C.section 360bbb-3(b)(1), unless the authorization is terminated  or revoked sooner.       Influenza A by PCR NEGATIVE NEGATIVE Final   Influenza B by PCR NEGATIVE NEGATIVE Final     Comment: (NOTE) The Xpert Xpress SARS-CoV-2/FLU/RSV plus assay is intended as an aid in the diagnosis of influenza from Nasopharyngeal swab specimens and should not be used as a sole basis for treatment. Nasal washings and aspirates are unacceptable for Xpert Xpress SARS-CoV-2/FLU/RSV testing.  Fact Sheet for Patients: EntrepreneurPulse.com.au  Fact Sheet for Healthcare Providers: IncredibleEmployment.be  This test is not yet approved or cleared by the [place] FDA and has been authorized for detection and/or diagnosis of SARS-CoV-2 by FDA under an Emergency Use Authorization (EUA). This EUA will remain in effect (meaning this test can be used) for the duration of the COVID-19 declaration under Section 564(b)(1) of the Act, 21 U.S.C. section 360bbb-3(b)(1), unless the authorization is terminated or revoked.  Performed at [facility], [place] [address] [place], [place] [address]   Resp Panel by RT-PCR (Flu A&B, Covid) Nasopharyngeal Swab     Status: None   Collection Time: 12/13/21 11:20 AM   Specimen: Nasopharyngeal Swab; Nasopharyngeal(NP) swabs in vial transport medium  Result Value Ref Range Status   SARS Coronavirus 2 by RT PCR NEGATIVE NEGATIVE Final    Comment: (NOTE) SARS-CoV-2 target nucleic acids are NOT DETECTED.  The SARS-CoV-2 RNA is generally detectable in upper respiratory specimens during the acute phase of infection. The lowest concentration of SARS-CoV-2 viral copies this assay can detect is 138 copies/mL. A negative result does not preclude SARS-Cov-2 infection and should not be used as the sole basis for treatment or other patient management decisions. A negative result may occur with  improper specimen collection/handling, submission of specimen other than  nasopharyngeal swab, presence of viral mutation(s) within the areas targeted by this assay, and inadequate number of viral copies(<138 copies/mL). A negative  result must be combined with clinical observations, patient history, and epidemiological information. The expected result is Negative.  Fact Sheet for Patients:  EntrepreneurPulse.com.au  Fact Sheet for Healthcare Providers:  IncredibleEmployment.be  This test is no t yet approved or cleared by the [place] FDA and  has been authorized for detection and/or diagnosis of SARS-CoV-2 by FDA under an Emergency Use Authorization (EUA). This EUA will remain  in effect (meaning this test can be used) for the duration of the COVID-19 declaration under Section 564(b)(1) of the Act, 21 U.S.C.section 360bbb-3(b)(1), unless the authorization is terminated  or revoked sooner.       Influenza A by PCR NEGATIVE NEGATIVE Final   Influenza B by PCR NEGATIVE NEGATIVE Final    Comment: (NOTE) The Xpert Xpress SARS-CoV-2/FLU/RSV plus assay is intended as an aid in the diagnosis of influenza from Nasopharyngeal swab specimens and should not be used as a sole basis for treatment. Nasal washings and aspirates are unacceptable for Xpert Xpress SARS-CoV-2/FLU/RSV testing.  Fact Sheet for Patients: EntrepreneurPulse.com.au  Fact Sheet for Healthcare Providers: IncredibleEmployment.be  This test is not yet approved or cleared by the [place] FDA and has been authorized for detection and/or diagnosis of SARS-CoV-2 by FDA under an Emergency Use Authorization (EUA). This EUA will remain in effect (meaning this test can be used) for the duration of the COVID-19 declaration under Section 564(b)(1) of the Act, 21 U.S.C. section 360bbb-3(b)(1), unless the authorization is terminated or revoked.  Performed at [facility], [place] [address], [place], [place] [address]     Please note: You were cared for by a hospitalist during your hospital stay. Once you are discharged, your primary care physician will handle any further  medical issues. Please note that NO REFILLS for any discharge medications will be authorized once you are discharged, as it is imperative that you return to your primary care physician (or establish a relationship with a primary care physician if you do not have one) for your post hospital discharge needs so that they can reassess your need for medications and monitor your lab values.    Time coordinating discharge: 40 minutes  SIGNED:   Shelly Coss, MD  [facility] 12/15/2021, 10:03 AM Pager [phone]  If 7PM-7AM, please contact night-coverage [email or web address] Password [email or web address]

## 2021-12-14 NOTE — TOC Progression Note (Signed)
Transition of Care [facility]) - Progression Note    Patient Details  Name: [name] MRN: [id number] Date of Birth: 1930/10/17  Transition of Care [facility]) CM/SW Contact  Joanne Chars, LCSW Phone Number: 12/14/2021, 9:50 AM  Clinical Narrative:   CSW spoke with kelly at [facility].  They cannot accept pt until tomorrow.  MD informed.      Expected Discharge Plan: [place] Barriers to Discharge: Continued Medical Work up  Expected Discharge Plan and Services Expected Discharge Plan: [place] In-house Referral: Clinical Social Work     Living arrangements for the past 2 months: Single Family Home                                       Social Determinants of Health (SDOH) Interventions    Readmission Risk Interventions No flowsheet data found.

## 2021-12-14 NOTE — Progress Notes (Signed)
Physical Therapy Treatment Patient Details Name: [name] MRN: [id number] DOB: July 21, 1930 Today's Date: 12/14/2021   History of Present Illness 87 y.o. male presents to [facility] hospital on 12/05/2021 after experiencing a fall at home on 12/14. Pt was down for multiple before his son was able to find him. PT admitted for rhabdomyolysis, afib with RVR. PMH includes atrial fibrillation on Eliquis, CKD stage II, hypothyroidism, mitral and tricuspid cuspid regurgitation.    PT Comments    Pt tolerates multiple transfers during session with use of STEDY. Pt continues to require significant physical assistance to perform all functional mobility tasks at this time, limited by weakness. Pt will benefit from continued acute PT services to improve activity tolerance and reduce caregiver burden.   Recommendations for follow up therapy are one component of a multi-disciplinary discharge planning process, led by the attending physician.  Recommendations may be updated based on patient status, additional functional criteria and insurance authorization.  Follow Up Recommendations  Skilled nursing-short term rehab (<3 hours/day)     Assistance Recommended at Discharge Intermittent Supervision/Assistance  Equipment Recommendations  Wheelchair (measurements PT);Wheelchair cushion (measurements PT);Hospital bed    Recommendations for Other Services       Precautions / Restrictions Precautions Precautions: Fall Precaution Comments: afib RVR Restrictions Weight Bearing Restrictions: No     Mobility  Bed Mobility Overal bed mobility: Needs Assistance Bed Mobility: Supine to Sit     Supine to sit: Max assist;HOB elevated          Transfers Overall transfer level: Needs assistance Equipment used:  (STEDY) Transfers: Sit to/from Stand;Bed to chair/wheelchair/BSC Sit to Stand: Mod assist;Max assist;+2 physical assistance           General transfer comment: pt transfers from bed to STEDY,  STEDY to [facility], bedside commode to STEDY, and finally from STEDY to recliner. Verbal cues for hand placement and trunk flexion Transfer via Lift Equipment: Stedy  Ambulation/Gait                   Stairs             Wheelchair Mobility    Modified Rankin (Stroke Patients Only)       Balance Overall balance assessment: Needs assistance Sitting-balance support: Single extremity supported;Bilateral upper extremity supported;Feet supported Sitting balance-Leahy Scale: Poor Sitting balance - Comments: reliant on UE support of bed   Standing balance support: Bilateral upper extremity supported;Reliant on assistive device for balance Standing balance-Leahy Scale: Zero Standing balance comment: modA +2 to stand on STEDY                            Cognition Arousal/Alertness: Awake/alert Behavior During Therapy: WFL for tasks assessed/performed Overall Cognitive Status: Within Functional Limits for tasks assessed                                          Exercises      General Comments General comments (skin integrity, edema, etc.): VSS on RA, tachy up to 110s      Pertinent Vitals/Pain Pain Assessment: Faces Faces Pain Scale: Hurts little more Pain Location: buttocks Pain Descriptors / Indicators: Grimacing Pain Intervention(s): Monitored during session    Home Living  Prior Function            PT Goals (current goals can now be found in the care plan section) Acute Rehab PT Goals Patient Stated Goal: to reduce pain and improve mobility quality Progress towards PT goals: Progressing toward goals    Frequency    Min 2X/week      PT Plan Current plan remains appropriate    Co-evaluation              AM-PAC PT "6 Clicks" Mobility   Outcome Measure  Help needed turning from your back to your side while in a flat bed without using bedrails?: A Lot Help needed moving from lying  on your back to sitting on the side of a flat bed without using bedrails?: A Lot Help needed moving to and from a bed to a chair (including a wheelchair)?: Total Help needed standing up from a chair using your arms (e.g., wheelchair or bedside chair)?: Total Help needed to walk in hospital room?: Total Help needed climbing 3-5 steps with a railing? : Total 6 Click Score: 8    End of Session   Activity Tolerance: Patient limited by fatigue Patient left: in chair;with call bell/phone within reach;with chair alarm set Nurse Communication: Mobility status;Need for lift equipment PT Visit Diagnosis: Other abnormalities of gait and mobility (R26.89);Muscle weakness (generalized) (M62.81);Pain     Time: 1115-1150 PT Time Calculation (min) (ACUTE ONLY): 35 min  Charges:  $Therapeutic Activity: 23-37 mins                     Zenaida Niece, PT, DPT Acute Rehabilitation Pager: [phone] Office [place] Amb[place] Raphael 12/14/2021, 1:30 PM

## 2021-12-14 NOTE — Progress Notes (Signed)
Physical Therapy Treatment Patient Details Name: [name] MRN: [id number] DOB: April 05, 1930 Today's Date: 12/14/2021   History of Present Illness 87 y.o. male presents to [facility] hospital on 12/05/2021 after experiencing a fall at home on 12/14. Pt was down for multiple before his son was able to find him. PT admitted for rhabdomyolysis, afib with RVR. PMH includes atrial fibrillation on Eliquis, CKD stage II, hypothyroidism, mitral and tricuspid cuspid regurgitation.    PT Comments    PT returns to assist pt back to bed. Pt continues to demonstrate significant weakness, further fatigued from sitting in recliner. Pt remains a +2 assist for transfers at this time and will benefit from continued PT services to aide in improving strength and mobility quality.   Recommendations for follow up therapy are one component of a multi-disciplinary discharge planning process, led by the attending physician.  Recommendations may be updated based on patient status, additional functional criteria and insurance authorization.  Follow Up Recommendations  Skilled nursing-short term rehab (<3 hours/day)     Assistance Recommended at Discharge Intermittent Supervision/Assistance  Equipment Recommendations  Wheelchair (measurements PT);Wheelchair cushion (measurements PT);Hospital bed (hoyer lift)    Recommendations for Other Services       Precautions / Restrictions Precautions Precautions: Fall Precaution Comments: afib RVR Restrictions Weight Bearing Restrictions: No     Mobility  Bed Mobility Overal bed mobility: Needs Assistance Bed Mobility: Sit to Supine     Supine to sit: Max assist;HOB elevated Sit to supine: Total assist        Transfers Overall transfer level: Needs assistance Equipment used:  (STEDY) Transfers: Sit to/from Stand Sit to Stand: Max assist;+2 physical assistance (standing on STEDY)           General transfer comment: pt transfers from recliner to [facility] and  then STEDY to bed Transfer via Lift Equipment: Stedy  Ambulation/Gait                   Stairs             Wheelchair Mobility    Modified Rankin (Stroke Patients Only)       Balance Overall balance assessment: Needs assistance Sitting-balance support: Single extremity supported;Bilateral upper extremity supported;Feet supported Sitting balance-Leahy Scale: Poor Sitting balance - Comments: reliant on UE support of bed   Standing balance support: Bilateral upper extremity supported;Reliant on assistive device for balance Standing balance-Leahy Scale: Zero Standing balance comment: min-modA x2 to stand on STEDY                            Cognition Arousal/Alertness: Awake/alert Behavior During Therapy: WFL for tasks assessed/performed Overall Cognitive Status: Within Functional Limits for tasks assessed                                          Exercises      General Comments General comments (skin integrity, edema, etc.): VSS on RA      Pertinent Vitals/Pain Pain Assessment: Faces Faces Pain Scale: Hurts even more Pain Location: generalized Pain Descriptors / Indicators: Grimacing Pain Intervention(s): Monitored during session    Home Living                          Prior Function  PT Goals (current goals can now be found in the care plan section) Acute Rehab PT Goals Patient Stated Goal: to reduce pain and improve mobility quality Progress towards PT goals: Progressing toward goals    Frequency    Min 2X/week      PT Plan Current plan remains appropriate    Co-evaluation              AM-PAC PT "6 Clicks" Mobility   Outcome Measure  Help needed turning from your back to your side while in a flat bed without using bedrails?: A Lot Help needed moving from lying on your back to sitting on the side of a flat bed without using bedrails?: Total Help needed moving to and from a bed  to a chair (including a wheelchair)?: Total Help needed standing up from a chair using your arms (e.g., wheelchair or bedside chair)?: Total Help needed to walk in hospital room?: Total Help needed climbing 3-5 steps with a railing? : Total 6 Click Score: 7    End of Session   Activity Tolerance: Patient limited by fatigue Patient left: in bed;with call bell/phone within reach;with family/visitor present Nurse Communication: Mobility status;Need for lift equipment PT Visit Diagnosis: Other abnormalities of gait and mobility (R26.89);Muscle weakness (generalized) (M62.81);Pain     Time: 0370-9643 PT Time Calculation (min) (ACUTE ONLY): 15 min  Charges:  $Therapeutic Activity: 8-22 mins                     Zenaida Niece, PT, DPT Acute Rehabilitation Pager: [phone] Office [place] Carmella Kees 12/14/2021, 1:35 PM

## 2021-12-15 DIAGNOSIS — R52 Pain, unspecified: Secondary | ICD-10-CM | POA: Diagnosis not present

## 2021-12-15 DIAGNOSIS — D649 Anemia, unspecified: Secondary | ICD-10-CM | POA: Diagnosis not present

## 2021-12-15 DIAGNOSIS — R55 Syncope and collapse: Secondary | ICD-10-CM | POA: Diagnosis present

## 2021-12-15 DIAGNOSIS — I2510 Atherosclerotic heart disease of native coronary artery without angina pectoris: Secondary | ICD-10-CM | POA: Diagnosis not present

## 2021-12-15 DIAGNOSIS — S31809A Unspecified open wound of unspecified buttock, initial encounter: Secondary | ICD-10-CM | POA: Diagnosis not present

## 2021-12-15 DIAGNOSIS — Z8679 Personal history of other diseases of the circulatory system: Secondary | ICD-10-CM | POA: Diagnosis not present

## 2021-12-15 DIAGNOSIS — R2689 Other abnormalities of gait and mobility: Secondary | ICD-10-CM | POA: Diagnosis not present

## 2021-12-15 DIAGNOSIS — R001 Bradycardia, unspecified: Secondary | ICD-10-CM | POA: Diagnosis not present

## 2021-12-15 DIAGNOSIS — M6282 Rhabdomyolysis: Secondary | ICD-10-CM | POA: Diagnosis not present

## 2021-12-15 DIAGNOSIS — I480 Paroxysmal atrial fibrillation: Secondary | ICD-10-CM | POA: Diagnosis not present

## 2021-12-15 DIAGNOSIS — R531 Weakness: Secondary | ICD-10-CM | POA: Diagnosis not present

## 2021-12-15 DIAGNOSIS — Z79899 Other long term (current) drug therapy: Secondary | ICD-10-CM | POA: Diagnosis not present

## 2021-12-15 DIAGNOSIS — L89610 Pressure ulcer of right heel, unstageable: Secondary | ICD-10-CM | POA: Diagnosis not present

## 2021-12-15 DIAGNOSIS — L89159 Pressure ulcer of sacral region, unspecified stage: Secondary | ICD-10-CM | POA: Diagnosis not present

## 2021-12-15 DIAGNOSIS — M545 Low back pain, unspecified: Secondary | ICD-10-CM | POA: Diagnosis not present

## 2021-12-15 DIAGNOSIS — I129 Hypertensive chronic kidney disease with stage 1 through stage 4 chronic kidney disease, or unspecified chronic kidney disease: Secondary | ICD-10-CM | POA: Diagnosis not present

## 2021-12-15 DIAGNOSIS — F4323 Adjustment disorder with mixed anxiety and depressed mood: Secondary | ICD-10-CM | POA: Diagnosis not present

## 2021-12-15 DIAGNOSIS — K900 Celiac disease: Secondary | ICD-10-CM | POA: Diagnosis not present

## 2021-12-15 DIAGNOSIS — R42 Dizziness and giddiness: Secondary | ICD-10-CM | POA: Diagnosis not present

## 2021-12-15 DIAGNOSIS — L089 Local infection of the skin and subcutaneous tissue, unspecified: Secondary | ICD-10-CM | POA: Diagnosis not present

## 2021-12-15 DIAGNOSIS — I4891 Unspecified atrial fibrillation: Secondary | ICD-10-CM | POA: Diagnosis not present

## 2021-12-15 DIAGNOSIS — Z7901 Long term (current) use of anticoagulants: Secondary | ICD-10-CM | POA: Diagnosis not present

## 2021-12-15 DIAGNOSIS — F4321 Adjustment disorder with depressed mood: Secondary | ICD-10-CM | POA: Diagnosis not present

## 2021-12-15 DIAGNOSIS — L89153 Pressure ulcer of sacral region, stage 3: Secondary | ICD-10-CM | POA: Diagnosis not present

## 2021-12-15 DIAGNOSIS — L89619 Pressure ulcer of right heel, unspecified stage: Secondary | ICD-10-CM | POA: Diagnosis not present

## 2021-12-15 DIAGNOSIS — M6281 Muscle weakness (generalized): Secondary | ICD-10-CM | POA: Diagnosis not present

## 2021-12-15 DIAGNOSIS — L989 Disorder of the skin and subcutaneous tissue, unspecified: Secondary | ICD-10-CM | POA: Diagnosis not present

## 2021-12-15 DIAGNOSIS — L89150 Pressure ulcer of sacral region, unstageable: Secondary | ICD-10-CM | POA: Diagnosis not present

## 2021-12-15 DIAGNOSIS — I951 Orthostatic hypotension: Secondary | ICD-10-CM | POA: Diagnosis not present

## 2021-12-15 DIAGNOSIS — L8990 Pressure ulcer of unspecified site, unspecified stage: Secondary | ICD-10-CM | POA: Diagnosis not present

## 2021-12-15 DIAGNOSIS — E872 Acidosis, unspecified: Secondary | ICD-10-CM | POA: Diagnosis not present

## 2021-12-15 DIAGNOSIS — D539 Nutritional anemia, unspecified: Secondary | ICD-10-CM | POA: Diagnosis not present

## 2021-12-15 DIAGNOSIS — Z7689 Persons encountering health services in other specified circumstances: Secondary | ICD-10-CM | POA: Diagnosis not present

## 2021-12-15 DIAGNOSIS — N179 Acute kidney failure, unspecified: Secondary | ICD-10-CM | POA: Diagnosis not present

## 2021-12-15 DIAGNOSIS — R7989 Other specified abnormal findings of blood chemistry: Secondary | ICD-10-CM | POA: Diagnosis not present

## 2021-12-15 DIAGNOSIS — L602 Onychogryphosis: Secondary | ICD-10-CM | POA: Diagnosis not present

## 2021-12-15 DIAGNOSIS — L89154 Pressure ulcer of sacral region, stage 4: Secondary | ICD-10-CM | POA: Diagnosis present

## 2021-12-15 DIAGNOSIS — I10 Essential (primary) hypertension: Secondary | ICD-10-CM | POA: Diagnosis not present

## 2021-12-15 DIAGNOSIS — Z7401 Bed confinement status: Secondary | ICD-10-CM | POA: Diagnosis not present

## 2021-12-15 DIAGNOSIS — E785 Hyperlipidemia, unspecified: Secondary | ICD-10-CM | POA: Diagnosis not present

## 2021-12-15 DIAGNOSIS — R4182 Altered mental status, unspecified: Secondary | ICD-10-CM | POA: Diagnosis not present

## 2021-12-15 DIAGNOSIS — I361 Nonrheumatic tricuspid (valve) insufficiency: Secondary | ICD-10-CM | POA: Diagnosis not present

## 2021-12-15 DIAGNOSIS — N182 Chronic kidney disease, stage 2 (mild): Secondary | ICD-10-CM | POA: Diagnosis not present

## 2021-12-15 DIAGNOSIS — E871 Hypo-osmolality and hyponatremia: Secondary | ICD-10-CM | POA: Diagnosis not present

## 2021-12-15 DIAGNOSIS — Z8546 Personal history of malignant neoplasm of prostate: Secondary | ICD-10-CM | POA: Diagnosis not present

## 2021-12-15 DIAGNOSIS — M79671 Pain in right foot: Secondary | ICD-10-CM | POA: Diagnosis not present

## 2021-12-15 DIAGNOSIS — M1990 Unspecified osteoarthritis, unspecified site: Secondary | ICD-10-CM | POA: Diagnosis not present

## 2021-12-15 DIAGNOSIS — Z743 Need for continuous supervision: Secondary | ICD-10-CM | POA: Diagnosis not present

## 2021-12-15 DIAGNOSIS — R5383 Other fatigue: Secondary | ICD-10-CM | POA: Diagnosis not present

## 2021-12-15 DIAGNOSIS — E039 Hypothyroidism, unspecified: Secondary | ICD-10-CM | POA: Diagnosis not present

## 2021-12-15 DIAGNOSIS — N390 Urinary tract infection, site not specified: Secondary | ICD-10-CM | POA: Diagnosis not present

## 2021-12-15 DIAGNOSIS — K5900 Constipation, unspecified: Secondary | ICD-10-CM | POA: Diagnosis not present

## 2021-12-15 DIAGNOSIS — M533 Sacrococcygeal disorders, not elsewhere classified: Secondary | ICD-10-CM | POA: Diagnosis not present

## 2021-12-15 DIAGNOSIS — Z9181 History of falling: Secondary | ICD-10-CM | POA: Diagnosis not present

## 2021-12-15 LAB — DIGOXIN LEVEL: Digoxin Level: 0.7 ng/mL — ABNORMAL LOW (ref 0.8–2.0)

## 2021-12-15 NOTE — Progress Notes (Signed)
Patient seen and examined at bedside this morning.  Hemodynamically stable for discharge today.  He has a bed at the skilled nursing facility.  No new change in the  medical management

## 2021-12-15 NOTE — Plan of Care (Signed)

## 2021-12-15 NOTE — Care Management Important Message (Signed)
Important Message  Patient Details  Name: [name] MRN: [id number] Date of Birth: 18-Mar-1930   Medicare Important Message Given:  Yes     Hannah Beat 12/15/2021, 1:32 PM

## 2021-12-15 NOTE — Discharge Instructions (Signed)

## 2021-12-15 NOTE — TOC Transition Note (Signed)
Transition of Care [facility]) - CM/SW Discharge Note   Patient Details  Name: [name] MRN: [id number] Date of Birth: 10-May-1930  Transition of Care [facility]) CM/SW Contact:  Joanne Chars, LCSW Phone Number: 12/15/2021, 11:38 AM   Clinical Narrative:   Pt discharging to [facility].  RN call report to [phone].  PTAR called [phone].     Final next level of care: Skilled Nursing Facility Barriers to Discharge: Barriers Resolved   Patient Goals and CMS Choice Patient states their goals for this hospitalization and ongoing recovery are:: SNF CMS Medicare.gov Compare Post Acute Care list provided to:: Patient Choice offered to / list presented to : Patient  Discharge Placement              Patient chooses bed at:  [facility]) Patient to be transferred to facility by: [place] Name of family member notified: son Merry Proud Patient and family notified of of transfer: 12/15/21  Discharge Plan and Services In-house Referral: Clinical Social Work                                   Social Determinants of Health ([place]) Interventions     Readmission Risk Interventions No flowsheet data found.

## 2021-12-15 NOTE — Progress Notes (Signed)
Discharge packet placed in drawer, IV taken out and patients belongings collected

## 2021-12-22 ENCOUNTER — Other Ambulatory Visit: Payer: Self-pay | Admitting: Internal Medicine

## 2021-12-22 DIAGNOSIS — K900 Celiac disease: Secondary | ICD-10-CM

## 2021-12-23 ENCOUNTER — Other Ambulatory Visit: Payer: Self-pay | Admitting: Cardiology

## 2021-12-23 DIAGNOSIS — R609 Edema, unspecified: Secondary | ICD-10-CM

## 2021-12-24 ENCOUNTER — Other Ambulatory Visit: Payer: Self-pay | Admitting: *Deleted

## 2021-12-24 DIAGNOSIS — L089 Local infection of the skin and subcutaneous tissue, unspecified: Secondary | ICD-10-CM | POA: Diagnosis not present

## 2021-12-24 DIAGNOSIS — I4891 Unspecified atrial fibrillation: Secondary | ICD-10-CM | POA: Diagnosis not present

## 2021-12-24 DIAGNOSIS — L8990 Pressure ulcer of unspecified site, unspecified stage: Secondary | ICD-10-CM | POA: Diagnosis not present

## 2021-12-24 NOTE — Patient Outreach (Addendum)
Per [place] eligible member resides in  [facility] SNF.  Screened for potential [facility] care coordination services.   Communication sent to facility SW to inquire about transition plans.  Will collaborate with facility SW and follow up with resident as appropriate.  Will continue to follow for potential [facility] needs and transition plans while member resides in SNF.    Marthenia Rolling, MSN, RN,BSN [place] Acute Care Coordinator [phone] [facility]) [phone]  (Toll free office)

## 2021-12-25 DIAGNOSIS — I361 Nonrheumatic tricuspid (valve) insufficiency: Secondary | ICD-10-CM | POA: Diagnosis not present

## 2021-12-25 DIAGNOSIS — N179 Acute kidney failure, unspecified: Secondary | ICD-10-CM | POA: Diagnosis not present

## 2021-12-25 DIAGNOSIS — I480 Paroxysmal atrial fibrillation: Secondary | ICD-10-CM | POA: Diagnosis not present

## 2021-12-25 DIAGNOSIS — Z9181 History of falling: Secondary | ICD-10-CM | POA: Diagnosis not present

## 2022-01-01 ENCOUNTER — Other Ambulatory Visit: Payer: Self-pay | Admitting: *Deleted

## 2022-01-01 NOTE — Patient Outreach (Signed)
[facility] Post- Acute Care Coordinator follow up. Per [place] eligible member resides in  [facility] SNF.  Screened for potential [facility] care coordination services as a benefit of [employer] plan.  [name] admitted to SNF on 12/17/21 after hospitalization.  Facility site visit to [facility] skilled nursing facility. Met with [name] with Admissions concerning member's progress, transition plan, and potential [facility] needs. [name] lived alone prior. Son [name] is primary contact/support.  Anticipated transition plan is transfer to [facility] facility in [place] vs possible LTC.  Spoke with [name] in room at [facility] to make aware writer is following for potential [facility] care coordination needs if transition plans were to change.   Member's PCP at [facility] has [place] care coordination team..   Provided [place] [employer] brochure, 24-hr nurse advice line magnet, and writer's contact information.   Will continue to collaborate with SNF SW and follow transition plans/needs while member resides in SNF.   Jerry Rolling, MSN, RN,BSN [place] Acute Care Coordinator [phone] [facility]) [phone]  (Toll free office)

## 2022-01-06 DIAGNOSIS — L89150 Pressure ulcer of sacral region, unstageable: Secondary | ICD-10-CM | POA: Diagnosis not present

## 2022-01-07 ENCOUNTER — Other Ambulatory Visit: Payer: Self-pay

## 2022-01-07 ENCOUNTER — Encounter (HOSPITAL_BASED_OUTPATIENT_CLINIC_OR_DEPARTMENT_OTHER): Payer: Medicare Other | Attending: Internal Medicine | Admitting: Internal Medicine

## 2022-01-07 DIAGNOSIS — L89154 Pressure ulcer of sacral region, stage 4: Secondary | ICD-10-CM | POA: Diagnosis not present

## 2022-01-07 DIAGNOSIS — L89153 Pressure ulcer of sacral region, stage 3: Secondary | ICD-10-CM | POA: Insufficient documentation

## 2022-01-07 DIAGNOSIS — N182 Chronic kidney disease, stage 2 (mild): Secondary | ICD-10-CM | POA: Diagnosis not present

## 2022-01-07 DIAGNOSIS — I129 Hypertensive chronic kidney disease with stage 1 through stage 4 chronic kidney disease, or unspecified chronic kidney disease: Secondary | ICD-10-CM | POA: Insufficient documentation

## 2022-01-07 DIAGNOSIS — E039 Hypothyroidism, unspecified: Secondary | ICD-10-CM

## 2022-01-07 DIAGNOSIS — L89610 Pressure ulcer of right heel, unstageable: Secondary | ICD-10-CM | POA: Diagnosis not present

## 2022-01-07 NOTE — Progress Notes (Signed)
[name] ([id number]) Visit Report for 01/07/2022 Abuse/Suicide Risk Screen Details Patient Name: Date of Service: [name], [name] 01/07/2022 1:15 PM Medical Record Number: [id number] Patient Account Number: [id number] Date of Birth/Sex: Treating RN: October 13, 1930 (88 y.o. [name] Primary Care Twanda Stakes: [name] Other Clinician: Referring Bambie Pizzolato: Treating [name]/Extender: [name] in Treatment: 0 Abuse/Suicide Risk Screen Items Answer ABUSE RISK SCREEN: Has anyone close to you tried to hurt or harm you recentlyo No Do you feel uncomfortable with anyone in your familyo No Has anyone forced you do things that you didnt want to doo No Electronic Signature(s) Signed: 01/07/2022 5:42:58 PM By: Dellie Catholic RN Entered By: Dellie Catholic on 01/07/2022 14:17:06 -------------------------------------------------------------------------------- Activities of Daily Living Details Patient Name: Date of Service: [name], [name] 01/07/2022 1:15 PM Medical Record Number: [id number] Patient Account Number: [id number] Date of Birth/Sex: Treating RN: December 31, 1929 (88 y.o. [name] Primary Care Kathee Tumlin: [name] Other Clinician: Referring [name]: Treating Zakaree Mcclenahan/Extender: [name] in Treatment: 0 Activities of Daily Living Items Answer Activities of Daily Living (Please select one for each item) Drive Automobile Not Able T Medications ake Need Assistance Use T elephone Need Assistance Care for Appearance Need Assistance Use T oilet Need Assistance Bath / Shower Need Assistance Dress Self Need Assistance Feed Self Completely Able Walk Need Assistance Get In / Out Bed Need Assistance Housework Not Able Prepare Meals Not Able Handle Money Not Able Shop for Self Not Able Electronic Signature(s) Signed: 01/07/2022 5:42:58 PM By: Dellie Catholic RN Entered By: Dellie Catholic on 01/07/2022  14:18:20 -------------------------------------------------------------------------------- Education Screening Details Patient Name: Date of Service: [name] 01/07/2022 1:15 PM Medical Record Number: [id number] Patient Account Number: [id number] Date of Birth/Sex: Treating RN: [birth year] (88 y.o. [name] Primary Care [name]: [name] Other Clinician: Referring [name]: Treating [name]/Extender: [name] in Treatment: 0 Learning Preferences/Education Level/Primary Language Learning Preference: Explanation, Demonstration, Printed Material Highest Education Level: College or Above Preferred Language: English Cognitive Barrier Language Barrier: No Translator Needed: No Memory Deficit: No Emotional Barrier: No Cultural/Religious Beliefs Affecting Medical Care: No Physical Barrier Impaired Vision: No Impaired Hearing: No Decreased Hand dexterity: No Knowledge/Comprehension Knowledge Level: High Comprehension Level: High Ability to understand written instructions: High Ability to understand verbal instructions: High Motivation Anxiety Level: Calm Cooperation: Cooperative Education Importance: Acknowledges Need Interest in Health Problems: Asks Questions Perception: Coherent Willingness to Engage in Self-Management High Activities: Readiness to Engage in Self-Management High Activities: Electronic Signature(s) Signed: 01/07/2022 5:42:58 PM By: Dellie Catholic RN Entered By: Dellie Catholic on 01/07/2022 14:19:14 -------------------------------------------------------------------------------- Fall Risk Assessment Details Patient Name: Date of Service: [name], [name] 01/07/2022 1:15 PM Medical Record Number: [id number] Patient Account Number: [id number] Date of Birth/Sex: Treating RN: 09-19-1930 (88 y.o. [name] Primary Care [name]: [name] Other Clinician: Referring [name]: Treating  [name]/Extender: [name] in Treatment: 0 Fall Risk Assessment Items Have you had 2 or more falls in the last 12 monthso 0 No Have you had any fall that resulted in injury in the last 12 monthso 0 Yes FALLS RISK SCREEN History of falling - immediate or within 3 months 25 Yes Secondary diagnosis (Do you have 2 or more medical diagnoseso) 0 No Ambulatory aid None/bed rest/wheelchair/nurse 0 Yes Crutches/cane/walker 15 Yes Furniture 0 No Intravenous therapy Access/Saline/Heparin Lock 0 No Gait/Transferring Normal/ bed rest/ wheelchair 0 No Weak (short steps with or without shuffle, stooped but able  to lift head while walking, may seek 0 No support from furniture) Impaired (short steps with shuffle, may have difficulty arising from chair, head down, impaired 20 Yes balance) Mental Status Oriented to own ability 0 No Electronic Signature(s) Signed: 01/07/2022 5:42:58 PM By: Dellie Catholic RN Entered By: Dellie Catholic on 01/07/2022 14:20:32 -------------------------------------------------------------------------------- Foot Assessment Details Patient Name: Date of Service: [name] 01/07/2022 1:15 PM Medical Record Number: [id number] Patient Account Number: [id number] Date of Birth/Sex: Treating RN: November 25, 1930 (88 y.o. [name] Primary Care [name]: [name] Other Clinician: Referring [name]: Treating [name]/Extender: [name] in Treatment: 0 Foot Assessment Items Site Locations + = Sensation present, - = Sensation absent, C = Callus, U = Ulcer R = Redness, W = Warmth, M = Maceration, PU = Pre-ulcerative lesion F = Fissure, S = Swelling, D = Dryness Assessment Right: Left: Other Deformity: No No Prior Foot Ulcer: No No Prior Amputation: No No Charcot Joint: No No Ambulatory Status: Non-ambulatory Assistance Device: Wheelchair Gait: Administrator, arts) Signed: 01/07/2022  5:42:58 PM By: Dellie Catholic RN Entered By: Dellie Catholic on 01/07/2022 14:25:50 -------------------------------------------------------------------------------- Nutrition Risk Screening Details Patient Name: Date of Service: [name], [name] 01/07/2022 1:15 PM Medical Record Number: [id number] Patient Account Number: [id number] Date of Birth/Sex: Treating RN: 03-09-1930 (88 y.o. [name] Primary Care Casimir Barcellos: [name] Other Clinician: Referring Tyeson Tanimoto: Treating Dijon Cosens/Extender: [name] in Treatment: 0 Height (in): 73 Weight (lbs): 145 Body Mass Index (BMI): 19.1 Nutrition Risk Screening Items Score Screening NUTRITION RISK SCREEN: I have an illness or condition that made me change the kind and/or amount of food I eat 0 No I eat fewer than two meals per day 0 No I eat few fruits and vegetables, or milk products 0 No I have three or more drinks of beer, liquor or wine almost every day 0 No I have tooth or mouth problems that make it hard for me to eat 0 No I don't always have enough money to buy the food I need 0 No I eat alone most of the time 0 No I take three or more different prescribed or over-the-counter drugs a day 1 Yes Without wanting to, I have lost or gained 10 pounds in the last six months 2 Yes I am not always physically able to shop, cook and/or feed myself 0 No Nutrition Protocols Good Risk Protocol Moderate Risk Protocol 0 Provide education on nutrition High Risk Proctocol Risk Level: Moderate Risk Score: 3 Electronic Signature(s) Signed: 01/07/2022 5:42:58 PM By: Dellie Catholic RN Entered By: Dellie Catholic on 01/07/2022 14:21:43

## 2022-01-11 DIAGNOSIS — L89150 Pressure ulcer of sacral region, unstageable: Secondary | ICD-10-CM | POA: Diagnosis not present

## 2022-01-11 DIAGNOSIS — E871 Hypo-osmolality and hyponatremia: Secondary | ICD-10-CM | POA: Diagnosis not present

## 2022-01-11 DIAGNOSIS — Z8679 Personal history of other diseases of the circulatory system: Secondary | ICD-10-CM | POA: Diagnosis not present

## 2022-01-11 DIAGNOSIS — D649 Anemia, unspecified: Secondary | ICD-10-CM | POA: Diagnosis not present

## 2022-01-11 NOTE — Progress Notes (Signed)
CHRSITOPHER, WIK ([id number]) Visit Report for 01/07/2022 Chief Complaint Document Details Patient Name: Date of Service: [name], [name] 01/07/2022 1:15 PM Medical Record Number: [id number] Patient Account Number: [id number] Date of Birth/Sex: Treating RN: 1930-03-02 (88 y.o. [name] Primary Care Provider: [name] Other Clinician: Referring Provider: Treating Provider/Extender: [name] in Treatment: 0 Information Obtained from: Patient Chief Complaint Sacral and left heel pressure ulcers Electronic Signature(s) Signed: 01/07/2022 4:30:25 PM By: [name] DO Entered By: [name] on 01/07/2022 15:29:20 -------------------------------------------------------------------------------- Debridement Details Patient Name: Date of Service: [name] 01/07/2022 1:15 PM Medical Record Number: [id number] Patient Account Number: [id number] Date of Birth/Sex: Treating RN: September 28, 1930 (88 y.o. [name] Primary Care Provider: [name] Other Clinician: Referring Provider: Treating Provider/Extender: [name] in Treatment: 0 Debridement Performed for Assessment: Wound #1 Left Calcaneus Performed By: Physician [name], DO Debridement Type: Debridement Level of Consciousness (Pre-procedure): Awake and Alert Pre-procedure Verification/Time Out Yes - 15:15 Taken: Start Time: 15:15 Pain Control: Other : Benzocaine 20% T Area Debrided (L x W): otal 2 (cm) x 3.1 (cm) = 6.2 (cm) Tissue and other material debrided: Non-Viable, Eschar Level: Non-Viable Tissue Debridement Description: Selective/Open Wound Instrument: Blade Bleeding: Minimum Hemostasis Achieved: Pressure End Time: 15:17 Procedural Pain: 2 Post Procedural Pain: 2 Response to Treatment: Procedure was tolerated well Level of Consciousness (Post- Awake and Alert procedure): Post Debridement Measurements of Total  Wound Length: (cm) 2 Stage: Unstageable/Unclassified Width: (cm) 3.1 Depth: (cm) 0.3 Volume: (cm) 1.461 Character of Wound/Ulcer Post Debridement: Requires Further Debridement Post Procedure Diagnosis Same as Pre-procedure Electronic Signature(s) Signed: 01/07/2022 4:30:25 PM By: [name] DO Signed: 01/07/2022 5:42:58 PM By: Dellie Catholic RN Entered By: Dellie Catholic on 01/07/2022 15:23:00 -------------------------------------------------------------------------------- Debridement Details Patient Name: Date of Service: [name] 01/07/2022 1:15 PM Medical Record Number: [id number] Patient Account Number: [id number] Date of Birth/Sex: Treating RN: 08/12/1930 (88 y.o. [name] Primary Care Provider: [name] Other Clinician: Referring Provider: Treating Provider/Extender: [name] in Treatment: 0 Debridement Performed for Assessment: Wound #2 Sacrum Performed By: Physician [name], DO Debridement Type: Chemical/Enzymatic/Mechanical Agent Used: Level of Consciousness (Pre-procedure): Awake and Alert Pre-procedure Verification/Time Out Yes - 15:15 Taken: Start Time: 15:15 Pain Control: Other : Benzocaine 20% Bleeding: Minimum Hemostasis Achieved: Pressure End Time: 15:17 Procedural Pain: 2 Post Procedural Pain: 2 Response to Treatment: Procedure was tolerated well Level of Consciousness (Post- Awake and Alert procedure): Post Debridement Measurements of Total Wound Length: (cm) 7 Stage: Category/Stage IV Width: (cm) 9.3 Depth: (cm) 2.2 Volume: (cm) 112.485 Character of Wound/Ulcer Post Debridement: Requires Further Debridement Post Procedure Diagnosis Same as Pre-procedure Electronic Signature(s) Signed: 01/07/2022 4:30:25 PM By: [name] DO Signed: 01/07/2022 5:42:58 PM By: Dellie Catholic RN Entered By: Dellie Catholic on 01/07/2022  15:26:32 -------------------------------------------------------------------------------- HPI Details Patient Name: Date of Service: [name], [name] 01/07/2022 1:15 PM Medical Record Number: [id number] Patient Account Number: [id number] Date of Birth/Sex: Treating RN: 1930/06/20 (88 y.o. [name] Primary Care Provider: [name] Other Clinician: Referring Provider: Treating Provider/Extender: [name] in Treatment: 0 History of Present Illness HPI Description: Admission 01/07/2022 [name] is an 88 year old male with a past medical history of prostate cancer, hypothyroidism and celiac disease that presents to the clinic for a 1 month history of ulcers to his sacrum and right heel. He was admitted to the hospital on 12/05/2021 for rhabdomyolysis secondary to  being on the floor for 2 days without being able to get up. He developed a pressure ulcer to his sacrum and right heel. In the hospital they were doing hydrotherapy. He was discharged on 12/27 to a skilled nursing facility. He has been using wet-to-dry dressings with Santyl to the sacrum and Betadine to the right heel wound. He currently denies systemic signs of infection. He has Prevalon boots and a group 2 air mattress. He has a hard time offloading the sacrum and is on his back most of the day. Electronic Signature(s) Signed: 01/07/2022 4:30:25 PM By: [name] DO Entered By: [name] on 01/07/2022 15:36:54 -------------------------------------------------------------------------------- Physical Exam Details Patient Name: Date of Service: [name], [name] 01/07/2022 1:15 PM Medical Record Number: [id number] Patient Account Number: [id number] Date of Birth/Sex: Treating RN: Mar 31, 1930 (88 y.o. [name] Primary Care Provider: [name] Other Clinician: Referring Provider: Treating Provider/Extender: [name] in Treatment:  0 Constitutional respirations regular, non-labored and within target range for patient.. Cardiovascular 2+ dorsalis pedis/posterior tibialis pulses. Psychiatric pleasant and cooperative. Notes Right lower extremity: T the heel there is an open wound with eschar throughout. Post debridement there is slough throughout. o T the sacrum there is an open wound with granulation tissue, undermining and tendon exposed. No surrounding signs of infection. o Electronic Signature(s) Signed: 01/07/2022 4:30:25 PM By: [name] DO Entered By: [name] on 01/07/2022 15:37:38 -------------------------------------------------------------------------------- Physician Orders Details Patient Name: Date of Service: [name] 01/07/2022 1:15 PM Medical Record Number: [id number] Patient Account Number: [id number] Date of Birth/Sex: Treating RN: May 16, 1930 (88 y.o. [name] Primary Care Provider: [name] Other Clinician: Referring Provider: Treating Provider/Extender: [name] in Treatment: 0 Verbal / Phone Orders: No Diagnosis Coding ICD-10 Coding Code Description L89.153 Pressure ulcer of sacral region, stage 3 L89.610 Pressure ulcer of right heel, unstageable E03.9 Hypothyroidism, unspecified Follow-up Appointments ppointment in 2 weeks. - Dr Heber [facility] Return A Bathing/ Shower/ Hygiene Other Bathing/Shower/Hygiene Orders/Instructions: - May have a sponge bath or shower Edema Control - Lymphedema / SCD / Other Right Lower Extremity Elevate legs to the level of the heart or above for 30 minutes daily and/or when sitting, a frequency of: - Throughout the day Moisturize legs daily. Off-Loading Low air-loss mattress (Group 2) Gel wheelchair cushion Turn and reposition every 2 hours Wound Treatment Wound #1 - Calcaneus Wound Laterality: Right Cleanser: Wound Cleanser 1 x Per Day/15 Days Discharge Instructions: Cleanse the wound with  wound cleanser prior to applying a clean dressing using gauze sponges, not tissue or cotton balls. Peri-Wound Care: Zinc Oxide Ointment 30g tube 1 x Per Day/15 Days Discharge Instructions: Apply Zinc Oxide to periwound with each dressing change Topical: Santyl Collagenase Ointment, 30 (gm), tube 1 x Per Day/15 Days Prim Dressing: Santyl Ointment 1 x Per Day/15 Days ary Discharge Instructions: Apply nickel thick amount to wound bed as instructed Secondary Dressing: Woven Gauze Sponge, Non-Sterile 4x4 in 1 x Per Day/15 Days Discharge Instructions: Apply over primary dressing as directed. Secondary Dressing: ALLEVYN Heel 4 1/2in x 5 1/2in / 10.5cm x 13.5cm 1 x Per Day/15 Days Discharge Instructions: Apply over primary dressing as directed. Secured With: [employer], 4.5x3.1 (in/yd) 1 x Per Day/15 Days Discharge Instructions: Secure with Kerlix as directed. Secured With: 66M Medipore H Soft Cloth Surgical T ape, 4 x 10 (in/yd) 1 x Per Day/15 Days Discharge Instructions: Secure with tape as directed. Wound #2 - Sacrum Cleanser: Wound  Cleanser 2 x Per VZD/63 Days Discharge Instructions: Cleanse the wound with wound cleanser prior to applying a clean dressing using gauze sponges, not tissue or cotton balls. Peri-Wound Care: Zinc Oxide Ointment 30g tube 2 x Per Day/15 Days Discharge Instructions: Apply Zinc Oxide to periwound with each dressing change Prim Dressing: Dakin's Solution 0.125%, 16 (oz) 2 x Per Day/15 Days ary Discharge Instructions: Moisten gauze with Dakin's solution Secondary Dressing: MPM [name] SAP Bordered Dressing, 7x6.7 (Sacral) (in/in) 2 x Per Day/15 Days Discharge Instructions: Apply silicone border over primary dressing as directed. Radiology X-ray, Right heel - Non healing wound on right heel - (ICD10 L89.610 - Pressure ulcer of right heel, unstageable) X-ray,sacrum - Non healing wound on sacrum - (ICD10 L89.153 - Pressure ulcer of sacral region, stage 3) Electronic  Signature(s) Signed: 01/07/2022 4:30:25 PM By: [name] DO Signed: 01/07/2022 5:42:58 PM By: Dellie Catholic RN Entered By: Dellie Catholic on 01/07/2022 15:41:54 Prescription 01/07/2022 -------------------------------------------------------------------------------- Arif, Ahmere C. [name] DO Patient Name: Provider: 06-14-1930 [id number] Date of Birth: NPI#: Jerilynn Mages [id number] Sex: DEA #: [phone] Phone #: License #: [place] Patient Address: [address] [place], [place] [address] [place], [place] [address] [phone] Allergies No Known Allergies Provider's Orders X-ray, Right heel - ICD10: L89.610 - Non healing wound on right heel Hand Signature: Date(s): Prescription 01/07/2022 Amsden, Zuhayr C. [name] DO Patient Name: Provider: 13-Jul-1930 [id number] Date of Birth: NPI#: Jerilynn Mages [id number] Sex: DEA #: [phone] Phone #: License #: [place] Patient Address: [address] [place], [place] [address] [place], [place] [address] [phone] Allergies No Known Allergies Provider's Orders X-ray,sacrum - ICD10: M35.361 - Non healing wound on sacrum Hand Signature: Date(s): Electronic Signature(s) Signed: 01/07/2022 4:30:25 PM By: [name] DO Signed: 01/07/2022 5:42:58 PM By: Dellie Catholic RN Entered By: Dellie Catholic on 01/07/2022 15:37:55 -------------------------------------------------------------------------------- Problem List Details Patient Name: Date of Service: [name], [name] 01/07/2022 1:15 PM Medical Record Number: [id number] Patient Account Number: [id number] Date of Birth/Sex: Treating RN: Oct 02, 1930 (88 y.o. [name] Primary Care Provider: [name] Other Clinician: Referring Provider: Treating Provider/Extender: [name] in Treatment: 0 Active Problems ICD-10 Encounter Code Description Active Date MDM Diagnosis L89.154 Pressure ulcer of sacral region, stage 4 01/07/2022 No Yes L89.610 Pressure ulcer of right heel, unstageable 01/07/2022 No Yes E03.9 Hypothyroidism, unspecified 01/07/2022 No Yes Inactive Problems Resolved Problems Electronic Signature(s) Signed: 01/07/2022 4:30:25 PM By: [name] DO Entered By: [name] on 01/07/2022 15:28:26 -------------------------------------------------------------------------------- Progress Note Details Patient Name: Date of Service: [name] 01/07/2022 1:15 PM Medical Record Number: [id number] Patient Account Number: [id number] Date of Birth/Sex: Treating RN: June 10, 1930 (88 y.o. [name] Primary Care Provider: [name] Other Clinician: Referring Provider: Treating Provider/Extender: [name] in Treatment: 0 Subjective Chief Complaint Information obtained from Patient Sacral and left heel pressure ulcers History of Present Illness (HPI) Admission 01/07/2022 [name] is an 88 year old male with a past medical history of prostate cancer, hypothyroidism and celiac disease that presents to the clinic for a 1 month history of ulcers to his sacrum and right heel. He was admitted to the hospital on 12/05/2021 for rhabdomyolysis secondary to being on the floor for 2 days without being able to get up. He developed a pressure ulcer to his sacrum and right heel. In the  hospital they were doing hydrotherapy. He was discharged on 12/27 to a skilled nursing facility. He has been using wet-to-dry dressings with Santyl to the sacrum and Betadine to the right heel wound. He currently denies systemic signs of infection. He has Prevalon boots and a group 2 air mattress. He has a hard time offloading the sacrum and is on his back most of the day. Patient History Information obtained from  Patient. Allergies No Known Allergies Family History Unknown History. Social History Never smoker, Marital Status - Widowed, Alcohol Use - Never, Drug Use - No History, Caffeine Use - Never. Medical History Eyes Patient has history of Cataracts - surgery on both eyes Denies history of Glaucoma, Optic Neuritis Cardiovascular Patient has history of Arrhythmia - A.Fib on Eliquis Denies history of Angina, Congestive Heart Failure, Coronary Artery Disease, Deep Vein Thrombosis, Hypertension, Hypotension, Myocardial Infarction, Peripheral Arterial Disease, Peripheral Venous Disease, Phlebitis, Vasculitis Endocrine Denies history of Type I Diabetes, Type II Diabetes Immunological Denies history of Lupus Erythematosus, Raynaudoos, Scleroderma Integumentary (Skin) Denies history of History of Burn Musculoskeletal Denies history of Gout, Rheumatoid Arthritis, Osteoarthritis, Osteomyelitis Neurologic Denies history of Dementia, Neuropathy, Quadriplegia, Paraplegia, Seizure Disorder Oncologic Denies history of Received Chemotherapy, Received Radiation Medical A Surgical History Notes nd Cardiovascular Hx Mitral regurgitation Endocrine Hx Hypothyroidism Genitourinary Recent UTI (had sepsis) CKD stage 2 Musculoskeletal Debility/Deconditioning Neurologic Pt. c/o Nerve damage in 2 fingers on Left hand (after fall) Oncologic Prostate cancer (had "seeds") Review of Systems (ROS) Constitutional Symptoms (General Health) Denies complaints or symptoms of Fatigue, Fever, Chills, Marked Weight Change. Eyes Complains or has symptoms of Glasses / Contacts. Denies complaints or symptoms of Dry Eyes, Vision Changes. Ear/Nose/Mouth/Throat Denies complaints or symptoms of Chronic sinus problems or rhinitis. Cardiovascular Denies complaints or symptoms of Chest pain. Gastrointestinal Denies complaints or symptoms of Frequent diarrhea, Nausea, Vomiting. Endocrine Denies complaints or symptoms  of Heat/cold intolerance. Genitourinary Denies complaints or symptoms of Frequent urination. Integumentary (Skin) Complains or has symptoms of Wounds - Wounds on sacrum and Right heel. Musculoskeletal Denies complaints or symptoms of Muscle Pain, Muscle Weakness. Neurologic Denies complaints or symptoms of Numbness/parasthesias. Psychiatric Denies complaints or symptoms of Claustrophobia, Suicidal. Objective Constitutional respirations regular, non-labored and within target range for patient.. Vitals Time Taken: 1:56 PM, Height: 73 in, Source: Stated, Weight: 145 lbs, Source: Stated, BMI: 19.1, Temperature: 98.6 F, Pulse: 72 bpm, Respiratory Rate: 16 breaths/min, Blood Pressure: 109/75 mmHg. Cardiovascular 2+ dorsalis pedis/posterior tibialis pulses. Psychiatric pleasant and cooperative. General Notes: Right lower extremity: T the heel there is an open wound with eschar throughout. Post debridement there is slough throughout. T the sacrum o o there is an open wound with granulation tissue, undermining and tendon exposed. No surrounding signs of infection. Integumentary (Hair, Skin) Wound #1 status is Open. Original cause of wound was Pressure Injury. The date acquired was: 12/02/2021. The wound is located on the Left Calcaneus. The wound measures 2cm length x 3.1cm width x 0.3cm depth; 4.869cm^2 area and 1.461cm^3 volume. There is Fat Layer (Subcutaneous Tissue) exposed. There is no tunneling noted, however, there is undermining starting at 4:00 and ending at 6:00 with a maximum distance of 0.3cm. There is a medium amount of serosanguineous drainage noted. The wound margin is thickened. There is no granulation within the wound bed. There is a large (67-100%) amount of necrotic tissue within the wound bed including Eschar. Wound #2 status is Open. Original cause of wound was Pressure Injury. The date acquired was: 12/02/2021. The wound is  located on the Sacrum. The wound measures 7cm  length x 9.3cm width x 2.2cm depth; 51.129cm^2 area and 112.485cm^3 volume. Tunneling has been noted at 6:00 with a maximum distance of 2.9cm. There is additional tunneling and at 8:00 with a maximum distance of 4.5cm. Undermining begins at 5:00 and ends at 12:00 with a maximum distance of 3.2cm. There is medium (34-66%) red granulation within the wound bed. There is a medium (34-66%) amount of necrotic tissue within the wound bed including Adherent Slough. Assessment Active Problems ICD-10 Pressure ulcer of sacral region, stage 4 Pressure ulcer of right heel, unstageable Hypothyroidism, unspecified Patient presents with a 1 month history of wounds to his sacrum and right heel caused by pressure due to being on the floor for several days without being able to move. I debrided nonviable tissue. No signs of surrounding infection on exam. At this time I recommended Dakin's wet-to-dry dressing twice daily to the sacrum and Santyl daily to the right heel wound. I Would also like to obtain x-rays of both wound sites to assess for any bony destruction. We discussed the importance of aggressive offloading to the wound beds and wound healing. He has Prevalon boots and a group 2 air mattress. I recommended he stay off of the sacrum the best he could and to reposition every 1-2 hours. Follow-up in 2 weeks. 46 minutes was spent on the encounter including face-to-face, EMR review and coordination of care Procedures Wound #1 Pre-procedure diagnosis of Wound #1 is a Pressure Ulcer located on the Left Calcaneus . There was a Selective/Open Wound Non-Viable Tissue Debridement with a total area of 6.2 sq cm performed by [name], DO. With the following instrument(s): Blade to remove Non-Viable tissue/material. Material removed includes Eschar after achieving pain control using Other (Benzocaine 20%). No specimens were taken. A time out was conducted at 15:15, prior to the start of the procedure. A Minimum  amount of bleeding was controlled with Pressure. The procedure was tolerated well with a pain level of 2 throughout and a pain level of 2 following the procedure. Post Debridement Measurements: 2cm length x 3.1cm width x 0.3cm depth; 1.461cm^3 volume. Post debridement Stage noted as Unstageable/Unclassified. Character of Wound/Ulcer Post Debridement requires further debridement. Post procedure Diagnosis Wound #1: Same as Pre-Procedure Wound #2 Pre-procedure diagnosis of Wound #2 is a Pressure Ulcer located on the Sacrum . There was a Chemical/Enzymatic/Mechanical debridement performed by [name], DO. to remove Non-Viable tissue/material. Material removed includes Eschar after achieving pain control using Other (Benzocaine 20%). A time out was conducted at 15:15, prior to the start of the procedure. A Minimum amount of bleeding was controlled with Pressure. The procedure was tolerated well with a pain level of 2 throughout and a pain level of 2 following the procedure. Post Debridement Measurements: 7cm length x 9.3cm width x 2.2cm depth; 112.485cm^3 volume. Post debridement Stage noted as Category/Stage IV. Character of Wound/Ulcer Post Debridement requires further debridement. Post procedure Diagnosis Wound #2: Same as Pre-Procedure Plan Follow-up Appointments: Return Appointment in 2 weeks. - Dr Heber [place] Bathing/ Shower/ Hygiene: Other Bathing/Shower/Hygiene Orders/Instructions: - May have a sponge bath or shower Edema Control - Lymphedema / SCD / Other: Elevate legs to the level of the heart or above for 30 minutes daily and/or when sitting, a frequency of: - Throughout the day Moisturize legs daily. WOUND #1: - Calcaneus Wound Laterality: Left Cleanser: Soap and Water Discharge Instructions: May shower and wash wound with dial antibacterial soap and water prior to dressing  change. Peri-Wound Care: Zinc Oxide Ointment 30g tube Discharge Instructions: Apply Zinc Oxide to periwound  with each dressing change Topical: Santyl Collagenase Ointment, 30 (gm), tube Secondary Dressing: Woven Gauze Sponge, Non-Sterile 4x4 in Discharge Instructions: Apply over primary dressing as directed. Secured With: [employer], 4.5x3.1 (in/yd) Discharge Instructions: Secure with Kerlix as directed. Secured With: 66M Medipore H Soft Cloth Surgical T ape, 4 x 10 (in/yd) Discharge Instructions: Secure with tape as directed. 1. In office sharp debridement 2. X-rays of the sacrum and right heel 3. Santyl 4. Dakin's wet-to-dry dressings 5. Follow-up in 2 weeks Electronic Signature(s) Signed: 01/07/2022 4:30:25 PM By: [name] DO Entered By: [name] on 01/07/2022 15:40:27 -------------------------------------------------------------------------------- HxROS Details Patient Name: Date of Service: [name], [name] 01/07/2022 1:15 PM Medical Record Number: [id number] Patient Account Number: [id number] Date of Birth/Sex: Treating RN: December 19, 1930 (88 y.o. [name] Primary Care Provider: [name] Other Clinician: Referring Provider: Treating Provider/Extender: [name] in Treatment: 0 Information Obtained From Patient Constitutional Symptoms (General Health) Complaints and Symptoms: Negative for: Fatigue; Fever; Chills; Marked Weight Change Eyes Complaints and Symptoms: Positive for: Glasses / Contacts Negative for: Dry Eyes; Vision Changes Medical History: Positive for: Cataracts - surgery on both eyes Negative for: Glaucoma; Optic Neuritis Ear/Nose/Mouth/Throat Complaints and Symptoms: Negative for: Chronic sinus problems or rhinitis Cardiovascular Complaints and Symptoms: Negative for: Chest pain Medical History: Positive for: Arrhythmia - A.Fib on Eliquis Negative for: Angina; Congestive Heart Failure; Coronary Artery Disease; Deep Vein Thrombosis; Hypertension; Hypotension; Myocardial Infarction; Peripheral  Arterial Disease; Peripheral Venous Disease; Phlebitis; Vasculitis Past Medical History Notes: Hx Mitral regurgitation Gastrointestinal Complaints and Symptoms: Negative for: Frequent diarrhea; Nausea; Vomiting Endocrine Complaints and Symptoms: Negative for: Heat/cold intolerance Medical History: Negative for: Type I Diabetes; Type II Diabetes Past Medical History Notes: Hx Hypothyroidism Genitourinary Complaints and Symptoms: Negative for: Frequent urination Medical History: Past Medical History Notes: Recent UTI (had sepsis) CKD stage 2 Integumentary (Skin) Complaints and Symptoms: Positive for: Wounds - Wounds on sacrum and Right heel Medical History: Negative for: History of Burn Musculoskeletal Complaints and Symptoms: Negative for: Muscle Pain; Muscle Weakness Medical History: Negative for: Gout; Rheumatoid Arthritis; Osteoarthritis; Osteomyelitis Past Medical History Notes: Debility/Deconditioning Neurologic Complaints and Symptoms: Negative for: Numbness/parasthesias Medical History: Negative for: Dementia; Neuropathy; Quadriplegia; Paraplegia; Seizure Disorder Past Medical History Notes: Pt. c/o Nerve damage in 2 fingers on Left hand (after fall) Psychiatric Complaints and Symptoms: Negative for: Claustrophobia; Suicidal Immunological Medical History: Negative for: Lupus Erythematosus; Raynauds; Scleroderma Oncologic Medical History: Negative for: Received Chemotherapy; Received Radiation Past Medical History Notes: Prostate cancer (had "seeds") HBO Extended History Items Eyes: Cataracts Immunizations Pneumococcal Vaccine: Received Pneumococcal Vaccination: Yes Received Pneumococcal Vaccination On or After 60th Birthday: Yes Implantable Devices No devices added Family and Social History Unknown History: Yes; Never smoker; Marital Status - Widowed; Alcohol Use: Never; Drug Use: No History; Caffeine Use: Never; Financial Concerns: No; Food,  Clothing or Shelter Needs: No; Support System Lacking: No; Transportation Concerns: No Electronic Signature(s) Signed: 01/07/2022 4:30:25 PM By: [name] DO Signed: 01/07/2022 5:42:58 PM By: Dellie Catholic RN Entered By: Dellie Catholic on 01/07/2022 14:16:43 -------------------------------------------------------------------------------- [place] Details Patient Name: Date of Service: [name] 01/07/2022 Medical Record Number: [id number] Patient Account Number: [id number] Date of Birth/Sex: Treating RN: 06/14/1930 (88 y.o. 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, unstageable E03.9 Hypothyroidism, unspecified Facility Procedures CPT4 Code: [id number] Description: [place] VISIT-LEV 3 EST PT Modifier: 25 Quantity: 1 CPT4 Code: 75916384 Description: 66599 - DEBRIDE W/O ANES NON SELECT ICD-10 Diagnosis Description L89.154 Pressure ulcer of sacral region, stage 4 Modifier: Quantity: 1 CPT4 Code: 35701779 Description: 39030 - DEBRIDE WOUND 1ST 20 SQ CM OR < ICD-10 Diagnosis Description L89.610 Pressure ulcer of right heel, unstageable Modifier: Quantity: 1 Physician Procedures : CPT4 Code Description Modifier [id number] - WC PHYS LEVEL 4 - NEW PT ICD-10 Diagnosis Description L89.154 Pressure ulcer of sacral region, stage 4 L89.610 Pressure ulcer of right heel, unstageable E03.9 Hypothyroidism, unspecified Quantity: 1 : [id number] 25638 - WC PHYS DEBR WO ANESTH 20 SQ CM ICD-10 Diagnosis Description L89.610 Pressure ulcer of right heel, unstageable Quantity: 1 Electronic Signature(s) Signed: 01/08/2022 9:10:14 AM By: [name] DO Signed: 01/11/2022 5:11:23 PM By: Levan Hurst RN,  BSN Previous Signature: 01/07/2022 4:30:25 PM Version By: [name] DO Entered By: Levan Hurst on 01/07/2022 17:06:42

## 2022-01-11 NOTE — Progress Notes (Signed)
[name], [name] ([id number]) Visit Report for 01/07/2022 Allergy List Details Patient Name: Date of Service: [name], [name] 01/07/2022 1:15 PM Medical Record Number: [id number] Patient Account Number: [id number] Date of Birth/Sex: Treating RN: [birth year] (88 y.o. [name] Primary Care Leelan Rajewski: [name] Other Clinician: Referring Penina Reisner: Treating Zaleah Ternes/Extender: [name] in Treatment: 0 Allergies Active Allergies No Known Allergies Allergy Notes Electronic Signature(s) Signed: 01/07/2022 5:42:58 PM By: Dellie Catholic RN Entered By: Dellie Catholic on 01/07/2022 14:00:22 -------------------------------------------------------------------------------- Arrival Information Details Patient Name: Date of Service: [name] 01/07/2022 1:15 PM Medical Record Number: [id number] Patient Account Number: [id number] Date of Birth/Sex: Treating RN: 02/13/1930 (88 y.o. [name] Primary Care Consuela Widener: [name] Other Clinician: Referring Noah Pelaez: Treating Teva Bronkema/Extender: [name] in Treatment: 0 Visit Information Patient Arrived: Wheel Chair Arrival Time: 13:53 Accompanied By: son Transfer Assistance: Manual Patient Identification Verified: Yes Secondary Verification Process Completed: Yes Patient Has Alerts: Yes Patient Alerts: Patient on Blood Thinner R ABI non compressible Notes ********Transferring pt. to chair required 3 people. Pt. on Eliquis Electronic Signature(s) Signed: 01/07/2022 5:42:58 PM By: Dellie Catholic RN Entered By: Dellie Catholic on 01/07/2022 15:05:05 -------------------------------------------------------------------------------- Clinic Level of Care Assessment Details Patient Name: Date of Service: [name], [name] 01/07/2022 1:15 PM Medical Record Number: [id number] Patient Account Number: [id number] Date of Birth/Sex: Treating RN: 1930/08/13 (88 y.o. Janyth Contes Primary Care Filiberto Wamble: [name] Other Clinician: Referring Zuria Fosdick: Treating Dayan Desa/Extender: [name] in Treatment: 0 Clinic Level of Care Assessment Items TOOL 1 Quantity Score X- 1 0 Use when EandM and Procedure is performed on INITIAL visit ASSESSMENTS - Nursing Assessment / Reassessment X- 1 20 General Physical Exam (combine w/ comprehensive assessment (listed just below) when performed on new pt. evals) X- 1 25 Comprehensive Assessment (HX, ROS, Risk Assessments, Wounds Hx, etc.) ASSESSMENTS - Wound and Skin Assessment / Reassessment []  - 0 Dermatologic / Skin Assessment (not related to wound area) ASSESSMENTS - Ostomy and/or Continence Assessment and Care []  - 0 Incontinence Assessment and Management []  - 0 Ostomy Care Assessment and Management (repouching, etc.) PROCESS - Coordination of Care X - Simple Patient / Family Education for ongoing care 1 15 []  - 0 Complex (extensive) Patient / Family Education for ongoing care X- 1 10 Staff obtains Programmer, systems, Records, T Results / Process Orders est X- 1 10 Staff telephones HHA, Nursing Homes / Clarify orders / etc []  - 0 Routine Transfer to another Facility (non-emergent condition) []  - 0 Routine Hospital Admission (non-emergent condition) X- 1 15 New Admissions / Biomedical engineer / Ordering NPWT Apligraf, etc. , []  - 0 Emergency Hospital Admission (emergent condition) PROCESS - Special Needs []  - 0 Pediatric / Minor Patient Management []  - 0 Isolation Patient Management []  - 0 Hearing / Language / Visual special needs []  - 0 Assessment of Community assistance (transportation, D/C planning, etc.) []  - 0 Additional assistance / Altered mentation []  - 0 Support Surface(s) Assessment (bed, cushion, seat, etc.) INTERVENTIONS - Miscellaneous []  - 0 External ear exam []  - 0 Patient Transfer (multiple staff / Civil Service fast streamer / Similar devices) []  - 0 Simple  Staple / Suture removal (25 or less) []  - 0 Complex Staple / Suture removal (26 or more) []  - 0 Hypo/Hyperglycemic Management (do not check if billed separately) X- 1 15 Ankle / Brachial Index (ABI) - do not check if billed separately Has the patient been seen at the hospital  within the last three years: Yes Total Score: 110 Level Of Care: New/Established - Level 3 Electronic Signature(s) Signed: 01/11/2022 5:11:23 PM By: Levan Hurst RN, BSN Signed: 01/11/2022 5:11:23 PM By: Levan Hurst RN, BSN Entered By: Levan Hurst on 01/07/2022 17:06:31 -------------------------------------------------------------------------------- Encounter Discharge Information Details Patient Name: Date of Service: [name], [name] 01/07/2022 1:15 PM Medical Record Number: [id number] Patient Account Number: [id number] Date of Birth/Sex: Treating RN: Mar 31, 1930 (88 y.o. Janyth Contes Primary Care Carisa Backhaus: [name] Other Clinician: Referring Fusaye Wachtel: Treating Avea Mcgowen/Extender: [name] in Treatment: 0 Encounter Discharge Information Items Post Procedure Vitals Discharge Condition: Stable Temperature (F): 98.6 Ambulatory Status: Wheelchair Pulse (bpm): 72 Discharge Destination: Home Respiratory Rate (breaths/min): 16 Transportation: Private Auto Blood Pressure (mmHg): 109/75 Accompanied By: son Schedule Follow-up Appointment: Yes Clinical Summary of Care: Patient Declined Electronic Signature(s) Signed: 01/11/2022 5:11:23 PM By: Levan Hurst RN, BSN Entered By: Levan Hurst on 01/07/2022 17:08:04 -------------------------------------------------------------------------------- Lower Extremity Assessment Details Patient Name: Date of Service: [name] 01/07/2022 1:15 PM Medical Record Number: [id number] Patient Account Number: [id number] Date of Birth/Sex: Treating RN: 03/12/1930 (88 y.o. [name] Primary Care Teren Zurcher: [name] Other Clinician: Referring Kaylon Laroche: Treating Sinan Tuch/Extender: [name] in Treatment: 0 Edema Assessment Assessed: Shirlyn Goltz: No] Patrice Paradise: No] Edema: [Left: No] [Right: No] Calf Left: Right: Point of Measurement: 32 cm From Medial Instep 31 cm Ankle Left: Right: Point of Measurement: 12 cm From Medial Instep 21.6 cm Knee To Floor Left: Right: From Medial Instep 50 cm Vascular Assessment Pulses: Dorsalis Pedis Palpable: [Right:Yes] Electronic Signature(s) Signed: 01/07/2022 5:42:58 PM By: Dellie Catholic RN Entered By: Dellie Catholic on 01/07/2022 15:06:07 -------------------------------------------------------------------------------- Multi Wound Chart Details Patient Name: Date of Service: [name], [name] 01/07/2022 1:15 PM Medical Record Number: [id number] Patient Account Number: [id number] Date of Birth/Sex: Treating RN: 1930-11-29 (88 y.o. Janyth Contes Primary Care Almir Botts: [name] Other Clinician: Referring Lyrik Dockstader: Treating Shalik Sanfilippo/Extender: [name] in Treatment: 0 Vital Signs Height(in): 73 Pulse(bpm): 72 Weight(lbs): 145 Blood Pressure(mmHg): 109/75 Body Mass Index(BMI): 19 Temperature(F): 98.6 Respiratory Rate(breaths/min): 16 Photos: [N/A:N/A] Left Calcaneus Sacrum N/A Wound Location: Pressure Injury Pressure Injury N/A Wounding Event: Pressure Ulcer Pressure Ulcer N/A Primary Etiology: Cataracts, Arrhythmia Cataracts, Arrhythmia N/A Comorbid History: 12/02/2021 12/02/2021 N/A Date Acquired: 0 0 N/A Weeks of Treatment: Open Open N/A Wound Status: 2x3.1x0.3 7x9.3x2.2 N/A Measurements L x W x D (cm) 4.869 51.129 N/A A (cm) : rea 1.461 112.485 N/A Volume (cm) : 0.00% 0.00% N/A % Reduction in A rea: 0.00% 0.00% N/A % Reduction in Volume: 6 Position 1 (o'clock): 2.9 Maximum Distance 1 (cm): 8 Position 2 (o'clock): 4.5 Maximum Distance 2 (cm): 4  5 Starting Position 1 (o'clock): 6 12 Ending Position 1 (o'clock): 0.3 3.2 Maximum Distance 1 (cm): No Yes N/A Tunneling: Yes Yes N/A Undermining: Unstageable/Unclassified Category/Stage IV N/A Classification: Medium N/A N/A Exudate A mount: Serosanguineous N/A N/A Exudate Type: red, brown N/A N/A Exudate Color: Thickened N/A N/A Wound Margin: None Present (0%) Medium (34-66%) N/A Granulation A mount: N/A Red N/A Granulation Quality: Large (67-100%) Medium (34-66%) N/A Necrotic A mount: Eschar Adherent Slough N/A Necrotic Tissue: Fat Layer (Subcutaneous Tissue): Yes Fascia: No N/A Exposed Structures: Fascia: No Fat Layer (Subcutaneous Tissue): No Tendon: No Tendon: No Muscle: No Muscle: No Joint: No Joint: No Bone: No Bone: No None N/A N/A Epithelialization: Debridement - Selective/Open Wound Chemical/Enzymatic/Mechanical - N/A Debridement: Selective/Open Wound Pre-procedure Verification/Time Out 15:15 15:15  N/A Taken: Other Other N/A Pain Control: Necrotic/Eschar Necrotic/Eschar N/A Tissue Debrided: Non-Viable Tissue N/A N/A Level: 6.2 N/A N/A Debridement A (sq cm): rea Blade N/A N/A Instrument: Minimum Minimum N/A Bleeding: Pressure Pressure N/A Hemostasis A chieved: 2 2 N/A Procedural Pain: 2 2 N/A Post Procedural Pain: Procedure was tolerated well Procedure was tolerated well N/A Debridement Treatment Response: 2x3.1x0.3 7x9.3x2.2 N/A Post Debridement Measurements L x W x D (cm) 1.461 112.485 N/A Post Debridement Volume: (cm) Unstageable/Unclassified Category/Stage IV N/A Post Debridement Stage: Debridement Debridement N/A Procedures Performed: Treatment Notes Electronic Signature(s) Signed: 01/07/2022 4:30:25 PM By: [name] DO Signed: 01/11/2022 5:11:23 PM By: Levan Hurst RN, BSN Entered By: [name] on 01/07/2022  15:28:41 -------------------------------------------------------------------------------- Multi-Disciplinary Care Plan Details Patient Name: Date of Service: [name] 01/07/2022 1:15 PM Medical Record Number: [id number] Patient Account Number: [id number] Date of Birth/Sex: Treating RN: August 11, 1930 (88 y.o. Janyth Contes Primary Care Calleigh Lafontant: [name] Other Clinician: Referring Alexie Lanni: Treating Keirra Zeimet/Extender: [name] in Treatment: 0 Multidisciplinary Care Plan reviewed with physician Active Inactive Abuse / Safety / Falls / Self Care Management Nursing Diagnoses: History of Falls Potential for injury related to falls Goals: Patient will not experience any injury related to falls Date Initiated: 01/07/2022 Target Resolution Date: 02/05/2022 Goal Status: Active Patient/caregiver will verbalize/demonstrate measures taken to prevent injury and/or falls Date Initiated: 01/07/2022 Target Resolution Date: 02/05/2022 Goal Status: Active Interventions: Assess Activities of Daily Living upon admission and as needed Assess fall risk on admission and as needed Assess: immobility, friction, shearing, incontinence upon admission and as needed Assess impairment of mobility on admission and as needed per policy Assess personal safety and home safety (as indicated) on admission and as needed Assess self care needs on admission and as needed Provide education on personal and home safety Notes: Pressure Nursing Diagnoses: Knowledge deficit related to causes and risk factors for pressure ulcer development Knowledge deficit related to management of pressures ulcers Potential for impaired tissue integrity related to pressure, friction, moisture, and shear Goals: Patient/caregiver will verbalize risk factors for pressure ulcer development Date Initiated: 01/07/2022 Target Resolution Date: 02/05/2022 Goal Status: Active Patient/caregiver will  verbalize understanding of pressure ulcer management Date Initiated: 01/07/2022 Target Resolution Date: 02/05/2022 Goal Status: Active Interventions: Assess: immobility, friction, shearing, incontinence upon admission and as needed Assess offloading mechanisms upon admission and as needed Assess potential for pressure ulcer upon admission and as needed Provide education on pressure ulcers Notes: Wound/Skin Impairment Nursing Diagnoses: Impaired tissue integrity Knowledge deficit related to ulceration/compromised skin integrity Goals: Patient/caregiver will verbalize understanding of skin care regimen Date Initiated: 01/07/2022 Target Resolution Date: 02/05/2022 Goal Status: Active Ulcer/skin breakdown will have a volume reduction of 30% by week 4 Date Initiated: 01/07/2022 Target Resolution Date: 02/05/2022 Goal Status: Active Interventions: Assess patient/caregiver ability to obtain necessary supplies Assess patient/caregiver ability to perform ulcer/skin care regimen upon admission and as needed Assess ulceration(s) every visit Provide education on ulcer and skin care Notes: Electronic Signature(s) Signed: 01/11/2022 5:11:23 PM By: Levan Hurst RN, BSN Entered By: Levan Hurst on 01/07/2022 17:04:37 -------------------------------------------------------------------------------- Pain Assessment Details Patient Name: Date of Service: [name] 01/07/2022 1:15 PM Medical Record Number: [id number] Patient Account Number: [id number] Date of Birth/Sex: Treating RN: 1930-07-05 (88 y.o. [name] Primary Care Tramon Crescenzo: [name] Other Clinician: Referring Kambrea Carrasco: Treating Zameer Borman/Extender: [name] in Treatment: 0 Active Problems Location of Pain Severity and Description of Pain Patient Has Paino No Site Locations  Pain Management and Medication Current Pain Management: Electronic Signature(s) Signed: 01/07/2022 5:42:58 PM  By: Dellie Catholic RN Entered By: Dellie Catholic on 01/07/2022 14:55:20 -------------------------------------------------------------------------------- Patient/Caregiver Education Details Patient Name: Date of Service: [name] 1/19/2023andnbsp1:15 PM Medical Record Number: [id number] Patient Account Number: [id number] Date of Birth/Gender: Treating RN: 01/15/1930 (88 y.o. Janyth Contes Primary Care Physician: [name] Other Clinician: Referring Physician: Treating Physician/Extender: [name] in Treatment: 0 Education Assessment Education Provided To: Patient Education Topics Provided Pressure: Methods: Explain/Verbal Responses: State content correctly Safety: Methods: Explain/Verbal Responses: State content correctly Wound/Skin Impairment: Methods: Explain/Verbal Responses: State content correctly Electronic Signature(s) Signed: 01/11/2022 5:11:23 PM By: Levan Hurst RN, BSN Entered By: Levan Hurst on 01/07/2022 17:04:50 -------------------------------------------------------------------------------- Wound Assessment Details Patient Name: Date of Service: [name] 01/07/2022 1:15 PM Medical Record Number: [id number] Patient Account Number: [id number] Date of Birth/Sex: Treating RN: December 15, 1930 (88 y.o. [name] Primary Care Itsel Opfer: [name] Other Clinician: Referring Ayeza Therriault: Treating Fantasy Donald/Extender: [name] in Treatment: 0 Wound Status Wound Number: 1 Primary Etiology: Pressure Ulcer Wound Location: Right Calcaneus Wound Status: Open Wounding Event: Pressure Injury Comorbid History: Cataracts, Arrhythmia Date Acquired: 12/02/2021 Weeks Of Treatment: 0 Clustered Wound: No Photos Wound Measurements Length: (cm) 2 Width: (cm) 3.1 Depth: (cm) 0.3 Area: (cm) 4.869 Volume: (cm) 1.461 % Reduction in Area: 0% % Reduction in Volume:  0% Epithelialization: None Tunneling: No Undermining: Yes Starting Position (o'clock): 4 Ending Position (o'clock): 6 Maximum Distance: (cm) 0.3 Wound Description Classification: Unstageable/Unclassified Wound Margin: Thickened Exudate Amount: Medium Exudate Type: Serosanguineous Exudate Color: red, brown Foul Odor After Cleansing: No Slough/Fibrino Yes Wound Bed Granulation Amount: None Present (0%) Exposed Structure Necrotic Amount: Large (67-100%) Fascia Exposed: No Necrotic Quality: Eschar Fat Layer (Subcutaneous Tissue) Exposed: Yes Tendon Exposed: No Muscle Exposed: No Joint Exposed: No Bone Exposed: No Treatment Notes Wound #1 (Calcaneus) Wound Laterality: Right Cleanser Wound Cleanser Discharge Instruction: Cleanse the wound with wound cleanser prior to applying a clean dressing using gauze sponges, not tissue or cotton balls. Peri-Wound Care Zinc Oxide Ointment 30g tube Discharge Instruction: Apply Zinc Oxide to periwound with each dressing change Topical Santyl Collagenase Ointment, 30 (gm), tube Primary Dressing Santyl Ointment Discharge Instruction: Apply nickel thick amount to wound bed as instructed Secondary Dressing Woven Gauze Sponge, Non-Sterile 4x4 in Discharge Instruction: Apply over primary dressing as directed. ALLEVYN Heel 4 1/2in x 5 1/2in / 10.5cm x 13.5cm Discharge Instruction: Apply over primary dressing as directed. Secured With [employer], 4.5x3.1 (in/yd) Discharge Instruction: Secure with Kerlix as directed. 74M Medipore H Soft Cloth Surgical T ape, 4 x 10 (in/yd) Discharge Instruction: Secure with tape as directed. Compression Wrap Compression Stockings Add-Ons Electronic Signature(s) Signed: 01/07/2022 5:42:58 PM By: Dellie Catholic RN Entered By: Dellie Catholic on 01/07/2022 15:41:12 -------------------------------------------------------------------------------- Wound Assessment Details Patient Name: Date of  Service: [name], [name] 01/07/2022 1:15 PM Medical Record Number: [id number] Patient Account Number: [id number] Date of Birth/Sex: Treating RN: [birth year] (88 y.o. [name] Primary Care Anjolie Majer: [name] Other Clinician: Referring Iver Miklas: Treating Larene Ascencio/Extender: [name] in Treatment: 0 Wound Status Wound Number: 2 Primary Etiology: Pressure Ulcer Wound Location: Sacrum Wound Status: Open Wounding Event: Pressure Injury Comorbid History: Cataracts, Arrhythmia Date Acquired: 12/02/2021 Weeks Of Treatment: 0 Clustered Wound: No Photos Wound Measurements Length: (cm) 7 Width: (cm) 9.3 Depth: (cm) 2.2 Area: (cm) 51.129 Volume: (cm) 112.485 % Reduction in Area: 0% % Reduction in  Volume: 0% Tunneling: Yes Location 1 Position (o'clock): 6 Maximum Distance: (cm) 2.9 Location 2 Position (o'clock): 8 Maximum Distance: (cm) 4.5 Undermining: Yes Starting Position (o'clock): 5 Ending Position (o'clock): 12 Maximum Distance: (cm) 3.2 Wound Description Classification: Category/Stage IV Exudate Amount: Medium Exudate Type: Serosanguineous Exudate Color: red, brown Foul Odor After Cleansing: No Slough/Fibrino Yes Wound Bed Granulation Amount: Medium (34-66%) Exposed Structure Granulation Quality: Red Fascia Exposed: No Necrotic Amount: Medium (34-66%) Fat Layer (Subcutaneous Tissue) Exposed: Yes Necrotic Quality: Adherent Slough Tendon Exposed: Yes Muscle Exposed: No Joint Exposed: No Bone Exposed: No Treatment Notes Wound #2 (Sacrum) Cleanser Wound Cleanser Discharge Instruction: Cleanse the wound with wound cleanser prior to applying a clean dressing using gauze sponges, not tissue or cotton balls. Peri-Wound Care Zinc Oxide Ointment 30g tube Discharge Instruction: Apply Zinc Oxide to periwound with each dressing change Topical Primary Dressing Dakin's Solution 0.125%, 16 (oz) Discharge Instruction: Moisten  gauze with Dakin's solution Secondary Dressing MPM [name] SAP Bordered Dressing, 7x6.7 (Sacral) (in/in) Discharge Instruction: Apply silicone border over primary dressing as directed. Secured With Compression Wrap Compression Stockings Environmental education officer) Signed: 01/07/2022 5:42:58 PM By: Dellie Catholic RN Entered By: Dellie Catholic on 01/07/2022 15:40:05 -------------------------------------------------------------------------------- Vitals Details Patient Name: Date of Service: [name], [name] 01/07/2022 1:15 PM Medical Record Number: [id number] Patient Account Number: [id number] Date of Birth/Sex: Treating RN: 15-Jun-1930 (88 y.o. [name] Primary Care Vinie Charity: [name] Other Clinician: Referring Casy Tavano: Treating Bernardo Brayman/Extender: [name] in Treatment: 0 Vital Signs Time Taken: 13:56 Temperature (F): 98.6 Height (in): 73 Pulse (bpm): 72 Source: Stated Respiratory Rate (breaths/min): 16 Weight (lbs): 145 Blood Pressure (mmHg): 109/75 Source: Stated Reference Range: 80 - 120 mg / dl Body Mass Index (BMI): 19.1 Electronic Signature(s) Signed: 01/07/2022 5:42:58 PM By: Dellie Catholic RN Entered By: Dellie Catholic on 01/07/2022 13:59:45

## 2022-01-21 ENCOUNTER — Encounter (HOSPITAL_BASED_OUTPATIENT_CLINIC_OR_DEPARTMENT_OTHER): Payer: Medicare Other | Attending: Internal Medicine | Admitting: Internal Medicine

## 2022-01-21 ENCOUNTER — Other Ambulatory Visit: Payer: Self-pay

## 2022-01-21 DIAGNOSIS — K900 Celiac disease: Secondary | ICD-10-CM | POA: Diagnosis not present

## 2022-01-21 DIAGNOSIS — Z7901 Long term (current) use of anticoagulants: Secondary | ICD-10-CM | POA: Diagnosis not present

## 2022-01-21 DIAGNOSIS — R5383 Other fatigue: Secondary | ICD-10-CM | POA: Diagnosis not present

## 2022-01-21 DIAGNOSIS — L89610 Pressure ulcer of right heel, unstageable: Secondary | ICD-10-CM | POA: Diagnosis not present

## 2022-01-21 DIAGNOSIS — L89154 Pressure ulcer of sacral region, stage 4: Secondary | ICD-10-CM | POA: Diagnosis not present

## 2022-01-21 DIAGNOSIS — N182 Chronic kidney disease, stage 2 (mild): Secondary | ICD-10-CM | POA: Insufficient documentation

## 2022-01-21 DIAGNOSIS — E039 Hypothyroidism, unspecified: Secondary | ICD-10-CM | POA: Insufficient documentation

## 2022-01-21 DIAGNOSIS — Z8546 Personal history of malignant neoplasm of prostate: Secondary | ICD-10-CM | POA: Insufficient documentation

## 2022-01-22 DIAGNOSIS — M6282 Rhabdomyolysis: Secondary | ICD-10-CM | POA: Diagnosis not present

## 2022-01-22 NOTE — Progress Notes (Signed)
BLAKELY, GLUTH ([id number]) Visit Report for 01/21/2022 Arrival Information Details Patient Name: Date of Service: CHRIST, FULLENWIDER 01/21/2022 12:45 PM Medical Record Number: [id number] Patient Account Number: [id number] Date of Birth/Sex: Treating RN: 02/01/1930 (88 y.o. [name] Primary Care Shayden Gingrich: [name] Other Clinician: Referring Anissia Wessells: Treating Graviela Nodal/Extender: [name] in Treatment: 2 Visit Information History Since Last Visit All ordered tests and consults were completed: Yes Patient Arrived: Wheel Chair Added or deleted any medications: No Arrival Time: 12:49 Any new allergies or adverse reactions: No Accompanied By: son Had a fall or experienced change in No Transfer Assistance: Manual activities of daily living that may affect Patient Identification Verified: Yes risk of falls: Secondary Verification Process Completed: Yes Signs or symptoms of abuse/neglect since last visito No Patient Has Alerts: Yes Hospitalized since last visit: No Patient Alerts: Patient on Blood Thinner Implantable device outside of the clinic excluding No R ABI non compressible cellular tissue based products placed in the center since last visit: Has Dressing in Place as Prescribed: Yes Pain Present Now: No Electronic Signature(s) Signed: 01/22/2022 1:32:11 PM By: [name] Entered By: [name] on 01/21/2022 12:59:16 -------------------------------------------------------------------------------- Encounter Discharge Information Details Patient Name: Date of Service: [name] 01/21/2022 12:45 PM Medical Record Number: [id number] Patient Account Number: [id number] Date of Birth/Sex: Treating RN: 04/19/1930 (88 y.o. [name] Primary Care Kayleeann Huxford: [name] Other Clinician: Referring Corky Blumstein: Treating [place] Kielbasa/Extender: [name] in Treatment: 2 Encounter Discharge Information Items  Post Procedure Vitals Discharge Condition: Stable Temperature (F): 97.4 Ambulatory Status: Wheelchair Pulse (bpm): 67 Discharge Destination: Home Respiratory Rate (breaths/min): 18 Transportation: Private Auto Blood Pressure (mmHg): 104/64 Accompanied By: Son Schedule Follow-up Appointment: Yes Clinical Summary of Care: Provided on 01/21/2022 Form Type Recipient Paper Patient Patient Electronic Signature(s) Signed: 01/21/2022 4:43:44 PM By: [name] Entered By: [name] on 01/21/2022 16:43:44 -------------------------------------------------------------------------------- Lower Extremity Assessment Details Patient Name: Date of Service: ROBLEY, MATASSA 01/21/2022 12:45 PM Medical Record Number: [id number] Patient Account Number: [id number] Date of Birth/Sex: Treating RN: [birth year] (88 y.o. [name] Primary Care Venicia Vandall: [name] Other Clinician: Referring Azura Tufaro: Treating Caragh Gasper/Extender: [name] in Treatment: 2 Edema Assessment Assessed: Shirlyn Goltz: No] Patrice Paradise: Yes] Edema: [Left: N] [Right: o] Calf Left: Right: Point of Measurement: 32 cm From Medial Instep 30 cm Ankle Left: Right: Point of Measurement: 12 cm From Medial Instep 21 cm Vascular Assessment Pulses: Dorsalis Pedis Palpable: [Right:Yes] Electronic Signature(s) Signed: 01/22/2022 1:32:11 PM By: [name] Entered By: [name] on 01/21/2022 13:12:08 -------------------------------------------------------------------------------- Multi Wound Chart Details Patient Name: Date of Service: [name] 01/21/2022 12:45 PM Medical Record Number: [id number] Patient Account Number: [id number] Date of Birth/Sex: Treating RN: 17-May-1930 (88 y.o. [name] Primary Care Markel Kurtenbach: [name] Other Clinician: Referring Infantof Villagomez: Treating Sanae Willetts/Extender: [name] in Treatment: 2 Vital Signs Height(in):  73 Pulse(bpm): 82 Weight(lbs): 145 Blood Pressure(mmHg): 104/64 Body Mass Index(BMI): 19.1 Temperature(F): 97.4 Respiratory Rate(breaths/min): 18 Photos: [1:Right Calcaneus] [2:Sacrum] [N/A:N/A N/A] Wound Location: [1:Pressure Injury] [2:Pressure Injury] [N/A:N/A] Wounding Event: [1:Pressure Ulcer] [2:Pressure Ulcer] [N/A:N/A] Primary Etiology: [1:Cataracts, Arrhythmia] [2:Cataracts, Arrhythmia] [N/A:N/A] Comorbid History: [1:12/02/2021] [2:12/02/2021] [N/A:N/A] Date Acquired: [1:2] [2:2] [N/A:N/A] Weeks of Treatment: [1:Open] [2:Open] [N/A:N/A] Wound Status: [1:No] [2:No] [N/A:N/A] Wound Recurrence: [1:2.2x2.1x0.3] [2:6.6x7x1.5] [N/A:N/A] Measurements L x W x D (cm) [1:3.629] [2:36.285] [N/A:N/A] A (cm) : rea [1:1.089] [2:54.428] [N/A:N/A] Volume (cm) : [1:25.50%] [2:29.00%] [N/A:N/A] % Reduction in A rea: [  1:25.50%] [2:51.60%] [N/A:N/A] % Reduction in Volume: [2:6] Position 1 (o'clock): [2:3] Maximum Distance 1 (cm): [2:7] Position 2 (o'clock): [2:2.2] Maximum Distance 2 (cm): [1:4] [2:5] Starting Position 1 (o'clock): [1:8] [2:8] Ending Position 1 (o'clock): [1:0.3] [2:2] Maximum Distance 1 (cm): [1:No] [2:Yes] [N/A:N/A] Tunneling: [1:Yes] [2:Yes] [N/A:N/A] Undermining: [1:Unstageable/Unclassified] [2:Category/Stage IV] [N/A:N/A] Classification: [1:Medium] [2:Medium] [N/A:N/A] Exudate A mount: [1:Serosanguineous] [2:Serosanguineous] [N/A:N/A] Exudate Type: [1:red, brown] [2:red, brown] [N/A:N/A] Exudate Color: [1:Distinct, outline attached] [2:Well defined, not attached] [N/A:N/A] Wound Margin: [1:Small (1-33%)] [2:Large (67-100%)] [N/A:N/A] Granulation A mount: [1:Pink] [2:Red] [N/A:N/A] Granulation Quality: [1:Large (67-100%)] [2:Small (1-33%)] [N/A:N/A] Necrotic A mount: [1:Fat Layer (Subcutaneous Tissue): Yes Fat Layer (Subcutaneous Tissue): Yes N/A] Exposed Structures: [1:Fascia: No Tendon: No Muscle: No Joint: No Bone: No None] [2:Tendon: Yes Fascia: No Muscle:  No Joint: No Bone: No Small (1-33%)] [N/A:N/A] Epithelialization: [1:Debridement - Excisional] [2:N/A] [N/A:N/A] Debridement: Pre-procedure Verification/Time Out 13:19 [2:N/A] [N/A:N/A] Taken: [1:Other] [2:N/A] [N/A:N/A] Pain Control: [1:Subcutaneous, Slough] [2:N/A] [N/A:N/A] Tissue Debrided: [1:Skin/Subcutaneous Tissue] [2:N/A] [N/A:N/A] Level: [1:4.62] [2:N/A] [N/A:N/A] Debridement A (sq cm): [1:rea Curette] [2:N/A] [N/A:N/A] Instrument: [1:Minimum] [2:N/A] [N/A:N/A] Bleeding: [1:Pressure] [2:N/A] [N/A:N/A] Hemostasis A chieved: [1:Procedure was tolerated well] [2:N/A] [N/A:N/A] Debridement Treatment Response: [1:2.2x2.1x0.3] [2:N/A] [N/A:N/A] Post Debridement Measurements L x W x D (cm) [1:1.089] [2:N/A] [N/A:N/A] Post Debridement Volume: (cm) [1:Unstageable/Unclassified] [2:N/A] [N/A:N/A] Post Debridement Stage: [1:Debridement] [2:N/A] [N/A:N/A] Treatment Notes Electronic Signature(s) Signed: 01/21/2022 2:05:44 PM By: [name] DO Signed: 01/22/2022 1:32:11 PM By: [name] Entered By: [name] on 01/21/2022 13:45:40 -------------------------------------------------------------------------------- Multi-Disciplinary Care Plan Details Patient Name: Date of Service: [name] 01/21/2022 12:45 PM Medical Record Number: [id number] Patient Account Number: [id number] Date of Birth/Sex: Treating RN: Dec 23, 1929 (88 y.o. [name] Primary Care Kaizley Aja: [name] Other Clinician: Referring Onofre Gains: Treating Braydin Aloi/Extender: [name] in Treatment: 2 Multidisciplinary Care Plan reviewed with physician Active Inactive Abuse / Safety / Falls / Self Care Management Nursing Diagnoses: History of Falls Potential for injury related to falls Goals: Patient will not experience any injury related to falls Date Initiated: 01/07/2022 Target Resolution Date: 02/05/2022 Goal Status: Active Patient/caregiver will  verbalize/demonstrate measures taken to prevent injury and/or falls Date Initiated: 01/07/2022 Target Resolution Date: 02/05/2022 Goal Status: Active Interventions: Assess Activities of Daily Living upon admission and as needed Assess fall risk on admission and as needed Assess: immobility, friction, shearing, incontinence upon admission and as needed Assess impairment of mobility on admission and as needed per policy Assess personal safety and home safety (as indicated) on admission and as needed Assess self care needs on admission and as needed Provide education on personal and home safety Notes: Pressure Nursing Diagnoses: Knowledge deficit related to causes and risk factors for pressure ulcer development Knowledge deficit related to management of pressures ulcers Potential for impaired tissue integrity related to pressure, friction, moisture, and shear Goals: Patient/caregiver will verbalize risk factors for pressure ulcer development Date Initiated: 01/07/2022 Target Resolution Date: 02/05/2022 Goal Status: Active Patient/caregiver will verbalize understanding of pressure ulcer management Date Initiated: 01/07/2022 Target Resolution Date: 02/05/2022 Goal Status: Active Interventions: Assess: immobility, friction, shearing, incontinence upon admission and as needed Assess offloading mechanisms upon admission and as needed Assess potential for pressure ulcer upon admission and as needed Provide education on pressure ulcers Notes: Wound/Skin Impairment Nursing Diagnoses: Impaired tissue integrity Knowledge deficit related to ulceration/compromised skin integrity Goals: Patient/caregiver will verbalize understanding of skin care regimen Date Initiated: 01/07/2022 Target Resolution Date: 02/05/2022 Goal Status: Active Ulcer/skin breakdown will have a volume reduction  of 30% by week 4 Date Initiated: 01/07/2022 Target Resolution Date: 02/05/2022 Goal Status:  Active Interventions: Assess patient/caregiver ability to obtain necessary supplies Assess patient/caregiver ability to perform ulcer/skin care regimen upon admission and as needed Assess ulceration(s) every visit Provide education on ulcer and skin care Notes: Electronic Signature(s) Signed: 01/22/2022 1:32:11 PM By: [name] Signed: 01/22/2022 1:32:11 PM By: [name] Entered By: [name] on 01/21/2022 13:12:41 -------------------------------------------------------------------------------- Pain Assessment Details Patient Name: Date of Service: [name] 01/21/2022 12:45 PM Medical Record Number: [id number] Patient Account Number: [id number] Date of Birth/Sex: Treating RN: [birth year] (88 y.o. [name] Primary Care Ahnaf Caponi: [name] Other Clinician: Referring Juandavid Dallman: Treating Cherolyn Behrle/Extender: [name] in Treatment: 2 Active Problems Location of Pain Severity and Description of Pain Patient Has Paino No Site Locations Pain Management and Medication Current Pain Management: Electronic Signature(s) Signed: 01/22/2022 1:32:11 PM By: [name] Entered By: [name] on 01/21/2022 13:00:02 -------------------------------------------------------------------------------- Patient/Caregiver Education Details Patient Name: Date of Service: [name] 2/2/2023andnbsp12:45 PM Medical Record Number: [id number] Patient Account Number: [id number] Date of Birth/Gender: Treating RN: 02/12/1930 (88 y.o. [name] Primary Care Physician: [name] Other Clinician: Referring Physician: Treating Physician/Extender: [name] in Treatment: 2 Education Assessment Education Provided To: Patient Education Topics Provided Pressure: Methods: Explain/Verbal, Printed Responses: State content correctly Wound/Skin Impairment: Methods: Explain/Verbal, Printed Responses: State  content correctly Electronic Signature(s) Signed: 01/22/2022 1:32:11 PM By: [name] Entered By: [name] on 01/21/2022 13:13:36 -------------------------------------------------------------------------------- Wound Assessment Details Patient Name: Date of Service: [name] 01/21/2022 12:45 PM Medical Record Number: [id number] Patient Account Number: [id number] Date of Birth/Sex: Treating RN: 23-Dec-1929 (88 y.o. [name] Primary Care Reneta Niehaus: [name] Other Clinician: Referring Carla Whilden: Treating Xzander Gilham/Extender: [name] in Treatment: 2 Wound Status Wound Number: 1 Primary Etiology: Pressure Ulcer Wound Location: Right Calcaneus Wound Status: Open Wounding Event: Pressure Injury Comorbid History: Cataracts, Arrhythmia Date Acquired: 12/02/2021 Weeks Of Treatment: 2 Clustered Wound: No Photos Wound Measurements Length: (cm) 2.2 Width: (cm) 2.1 Depth: (cm) 0.3 Area: (cm) 3.629 Volume: (cm) 1.089 % Reduction in Area: 25.5% % Reduction in Volume: 25.5% Epithelialization: None Tunneling: No Undermining: Yes Starting Position (o'clock): 4 Ending Position (o'clock): 8 Maximum Distance: (cm) 0.3 Wound Description Classification: Unstageable/Unclassified Wound Margin: Distinct, outline attached Exudate Amount: Medium Exudate Type: Serosanguineous Exudate Color: red, brown Foul Odor After Cleansing: No Slough/Fibrino Yes Wound Bed Granulation Amount: Small (1-33%) Exposed Structure Granulation Quality: Pink Fascia Exposed: No Necrotic Amount: Large (67-100%) Fat Layer (Subcutaneous Tissue) Exposed: Yes Necrotic Quality: Adherent Slough Tendon Exposed: No Muscle Exposed: No Joint Exposed: No Bone Exposed: No Treatment Notes Wound #1 (Calcaneus) Wound Laterality: Right Cleanser Wound Cleanser Discharge Instruction: Cleanse the wound with wound cleanser prior to applying a clean dressing using gauze  sponges, not tissue or cotton balls. Peri-Wound Care Zinc Oxide Ointment 30g tube Discharge Instruction: Apply Zinc Oxide to periwound with each dressing change Topical Primary Dressing Santyl Ointment Discharge Instruction: Apply nickel thick amount to wound bed as instructed Secondary Dressing Woven Gauze Sponge, Non-Sterile 4x4 in Discharge Instruction: Apply over primary dressing as directed. ALLEVYN Heel 4 1/2in x 5 1/2in / 10.5cm x 13.5cm Discharge Instruction: Apply over primary dressing as directed. Secured With [employer], 4.5x3.1 (in/yd) Discharge Instruction: Secure with Kerlix as directed. 66M Medipore H Soft Cloth Surgical T ape, 4 x 10 (in/yd) Discharge Instruction: Secure with tape as directed. Compression Wrap Compression  Stockings Environmental education officer) Signed: 01/22/2022 1:32:11 PM By: [name] Entered By: [name] on 01/21/2022 13:04:56 -------------------------------------------------------------------------------- Wound Assessment Details Patient Name: Date of Service: LANDY, DUNNAVANT 01/21/2022 12:45 PM Medical Record Number: [id number] Patient Account Number: [id number] Date of Birth/Sex: Treating RN: September 16, 1930 (89 y.o. [name] Primary Care Lakeishia Truluck: [name] Other Clinician: Referring Schuyler Behan: Treating Mahalie Kanner/Extender: [name] in Treatment: 2 Wound Status Wound Number: 2 Primary Etiology: Pressure Ulcer Wound Location: Sacrum Wound Status: Open Wounding Event: Pressure Injury Comorbid History: Cataracts, Arrhythmia Date Acquired: 12/02/2021 Weeks Of Treatment: 2 Clustered Wound: No Photos Wound Measurements Length: (cm) 6.6 Width: (cm) 7 Depth: (cm) 1.5 Area: (cm) 36.285 Volume: (cm) 54.428 % Reduction in Area: 29% % Reduction in Volume: 51.6% Epithelialization: Small (1-33%) Tunneling: Yes Location 1 Position (o'clock): 6 Maximum Distance: (cm) 3 Location  2 Position (o'clock): 7 Maximum Distance: (cm) 2.2 Undermining: Yes Starting Position (o'clock): 5 Ending Position (o'clock): 8 Maximum Distance: (cm) 2 Wound Description Classification: Category/Stage IV Wound Margin: Well defined, not attached Exudate Amount: Medium Exudate Type: Serosanguineous Exudate Color: red, brown Foul Odor After Cleansing: No Slough/Fibrino Yes Wound Bed Granulation Amount: Large (67-100%) Exposed Structure Granulation Quality: Red Fascia Exposed: No Necrotic Amount: Small (1-33%) Fat Layer (Subcutaneous Tissue) Exposed: Yes Necrotic Quality: Adherent Slough Tendon Exposed: Yes Muscle Exposed: No Joint Exposed: No Bone Exposed: No Treatment Notes Wound #2 (Sacrum) Cleanser Wound Cleanser Discharge Instruction: Cleanse the wound with wound cleanser prior to applying a clean dressing using gauze sponges, not tissue or cotton balls. Peri-Wound Care Zinc Oxide Ointment 30g tube Discharge Instruction: Apply Zinc Oxide to periwound with each dressing change Topical Primary Dressing Dakin's Solution 0.125%, 16 (oz) Discharge Instruction: Moisten gauze with Dakin's solution Secondary Dressing MPM [name] SAP Bordered Dressing, 7x6.7 (Sacral) (in/in) Discharge Instruction: Apply silicone border over primary dressing as directed. Secured With Compression Wrap Compression Stockings Environmental education officer) Signed: 01/22/2022 1:32:11 PM By: [name] Entered By: [name] on 01/21/2022 13:10:07 -------------------------------------------------------------------------------- Vitals Details Patient Name: Date of Service: [name] 01/21/2022 12:45 PM Medical Record Number: [id number] Patient Account Number: [id number] Date of Birth/Sex: Treating RN: [birth year] (88 y.o. [name] Primary Care Samie Barclift: [name] Other Clinician: Referring Danyella Mcginty: Treating [employer]/Extender: [name] in  Treatment: 2 Vital Signs Time Taken: 12:59 Temperature (F): 97.4 Height (in): 73 Pulse (bpm): 67 Weight (lbs): 145 Respiratory Rate (breaths/min): 18 Body Mass Index (BMI): 19.1 Blood Pressure (mmHg): 104/64 Reference Range: 80 - 120 mg / dl Electronic Signature(s) Signed: 01/22/2022 1:32:11 PM By: [name] Entered By: [name] on 01/21/2022 12:59:38

## 2022-01-22 NOTE — Progress Notes (Signed)
[name], [name] ([id number]) Visit Report for 01/21/2022 Chief Complaint Document Details Patient Name: Date of Service: [name], [name] 01/21/2022 12:45 PM Medical Record Number: [id number] Patient Account Number: [id number] Date of Birth/Sex: Treating RN: [birth year] (88 y.o. [name] Primary Care Provider: [name] Other Clinician: Referring Provider: Treating Provider/Extender: [name] in Treatment: 2 Information Obtained from: Patient Chief Complaint Sacral and left heel pressure ulcers Electronic Signature(s) Signed: 01/21/2022 2:05:44 PM By: [name] DO Entered By: [name] on 01/21/2022 13:45:47 -------------------------------------------------------------------------------- Debridement Details Patient Name: Date of Service: [name] 01/21/2022 12:45 PM Medical Record Number: [id number] Patient Account Number: [id number] Date of Birth/Sex: Treating RN: 01-Dec-1930 (88 y.o. [name] Primary Care Provider: [name] Other Clinician: Referring Provider: Treating Provider/Extender: [name] in Treatment: 2 Debridement Performed for Assessment: Wound #1 Right Calcaneus Performed By: Physician [name], DO Debridement Type: Debridement Level of Consciousness (Pre-procedure): Awake and Alert Pre-procedure Verification/Time Out Yes - 13:19 Taken: Start Time: 13:20 Pain Control: Other : benzocaine 20% T Area Debrided (L x W): otal 2.2 (cm) x 2.1 (cm) = 4.62 (cm) Tissue and other material debrided: Non-Viable, Slough, Subcutaneous, Slough Level: Skin/Subcutaneous Tissue Debridement Description: Excisional Instrument: Curette Bleeding: Minimum Hemostasis Achieved: Pressure End Time: 13:23 Response to Treatment: Procedure was tolerated well Level of Consciousness (Post- Awake and Alert procedure): Post Debridement Measurements of Total Wound Length: (cm)  2.2 Stage: Unstageable/Unclassified Width: (cm) 2.1 Depth: (cm) 0.3 Volume: (cm) 1.089 Character of Wound/Ulcer Post Debridement: Stable Post Procedure Diagnosis Same as Pre-procedure Electronic Signature(s) Signed: 01/21/2022 2:05:44 PM By: [name] DO Signed: 01/22/2022 1:32:11 PM By: [name] Entered By: [name] on 01/21/2022 13:27:00 -------------------------------------------------------------------------------- HPI Details Patient Name: Date of Service: [name] 01/21/2022 12:45 PM Medical Record Number: [id number] Patient Account Number: [id number] Date of Birth/Sex: Treating RN: [birth year] (88 y.o. [name] Primary Care Provider: [name] Other Clinician: Referring Provider: Treating Provider/Extender: [name] in Treatment: 2 History of Present Illness HPI Description: Admission 01/07/2022 Mr. Jerry Deyoung is an 88 year old male with a past medical history of prostate cancer, hypothyroidism and celiac disease that presents to the clinic for a 1 month history of ulcers to his sacrum and right heel. He was admitted to the hospital on 12/05/2021 for rhabdomyolysis secondary to being on the floor for 2 days without being able to get up. He developed a pressure ulcer to his sacrum and right heel. In the hospital they were doing hydrotherapy. He was discharged on 12/27 to a skilled nursing facility. He has been using wet-to-dry dressings with Santyl to the sacrum and Betadine to the right heel wound. He currently denies systemic signs of infection. He has Prevalon boots and a group 2 air mattress. He has a hard time offloading the sacrum and is on his back most of the day. 2/2; patient presents for follow-up. Santyl has been used to the right heel wound and Dakin's wet-to-dry dressings to the sacral wound. He states he had imaging done to both wound bed areas. He has no issues or complaints today. He denies signs of  infection. Electronic Signature(s) Signed: 01/21/2022 2:05:44 PM By: [name] DO Entered By: [name] on 01/21/2022 13:47:27 -------------------------------------------------------------------------------- Physical Exam Details Patient Name: Date of Service: [name], [name] 01/21/2022 12:45 PM Medical Record Number: [id number] Patient Account Number: [id number] Date of Birth/Sex: Treating RN: Apr 23, 1930 (88 y.o. [name] Primary Care Provider:  [name] Other Clinician: Referring Provider: Treating Provider/Extender: [name] in Treatment: 2 Constitutional respirations regular, non-labored and within target range for patient.. Cardiovascular 2+ dorsalis pedis/posterior tibialis pulses. Psychiatric pleasant and cooperative. Notes Right lower extremity: T the heel there is an open wound with non viable tissue throughout. T the sacrum there is an open wound with granulation tissue, o o undermining and minimal tendon exposed. No surrounding signs of infection. Electronic Signature(s) Signed: 01/21/2022 2:05:44 PM By: [name] DO Entered By: [name] on 01/21/2022 13:53:34 -------------------------------------------------------------------------------- Physician Orders Details Patient Name: Date of Service: [name] 01/21/2022 12:45 PM Medical Record Number: [id number] Patient Account Number: [id number] Date of Birth/Sex: Treating RN: 07/15/1930 (88 y.o. [name] Primary Care Provider: [name] Other Clinician: Referring Provider: Treating Provider/Extender: [name] in Treatment: 2 Verbal / Phone Orders: No Diagnosis Coding ICD-10 Coding Code Description L89.154 Pressure ulcer of sacral region, stage 4 L89.610 Pressure ulcer of right heel, unstageable E03.9 Hypothyroidism, unspecified Follow-up Appointments ppointment in 2 weeks. - Dr Heber [place] Return  A Other: - ** Please fax xray results to [phone]** Bathing/ Shower/ Hygiene Other Bathing/Shower/Hygiene Orders/Instructions: - May have a sponge bath or shower Edema Control - Lymphedema / SCD / Other Right Lower Extremity Elevate legs to the level of the heart or above for 30 minutes daily and/or when sitting, a frequency of: - Throughout the day Moisturize legs daily. Off-Loading Heel suspension boot to: - Prevalon Boots to bilateral feet Low air-loss mattress (Group 2) Gel wheelchair cushion Turn and reposition every 2 hours Additional Orders / Instructions Follow Nutritious Diet - High protein for wound healing Wound Treatment Wound #1 - Calcaneus Wound Laterality: Right Cleanser: Wound Cleanser 1 x Per Day/15 Days Discharge Instructions: Cleanse the wound with wound cleanser prior to applying a clean dressing using gauze sponges, not tissue or cotton balls. Peri-Wound Care: Zinc Oxide Ointment 30g tube 1 x Per Day/15 Days Discharge Instructions: Apply Zinc Oxide to periwound with each dressing change Prim Dressing: Santyl Ointment 1 x Per Day/15 Days ary Discharge Instructions: Apply nickel thick amount to wound bed as instructed Secondary Dressing: Woven Gauze Sponge, Non-Sterile 4x4 in 1 x Per Day/15 Days Discharge Instructions: Apply over primary dressing as directed. Secondary Dressing: ALLEVYN Heel 4 1/2in x 5 1/2in / 10.5cm x 13.5cm 1 x Per Day/15 Days Discharge Instructions: Apply over primary dressing as directed. Secured With: [employer], 4.5x3.1 (in/yd) 1 x Per Day/15 Days Discharge Instructions: Secure with Kerlix as directed. Secured With: 63M Medipore H Soft Cloth Surgical T ape, 4 x 10 (in/yd) 1 x Per Day/15 Days Discharge Instructions: Secure with tape as directed. Wound #2 - Sacrum Cleanser: Wound Cleanser 2 x Per PTW/65 Days Discharge Instructions: Cleanse the wound with wound cleanser prior to applying a clean dressing using gauze sponges, not  tissue or cotton balls. Peri-Wound Care: Zinc Oxide Ointment 30g tube 2 x Per Day/15 Days Discharge Instructions: Apply Zinc Oxide to periwound with each dressing change Prim Dressing: Dakin's Solution 0.125%, 16 (oz) 2 x Per Day/15 Days ary Discharge Instructions: Moisten gauze with Dakin's solution Secondary Dressing: MPM [name] SAP Bordered Dressing, 7x6.7 (Sacral) (in/in) 2 x Per Day/15 Days Discharge Instructions: Apply silicone border over primary dressing as directed. Electronic Signature(s) Signed: 01/21/2022 2:05:44 PM By: [name] DO Entered By: [name] on 01/21/2022 13:53:55 -------------------------------------------------------------------------------- Problem List Details Patient Name: Date of Service: [name] 01/21/2022 12:45 PM Medical Record  Number: [id number] Patient Account Number: [id number] Date of Birth/Sex: Treating RN: 21-Jul-1930 (88 y.o. [name] Primary Care Provider: [name] Other Clinician: Referring Provider: Treating Provider/Extender: [name] in Treatment: 2 Active Problems ICD-10 Encounter Code Description Active Date MDM Diagnosis L89.154 Pressure ulcer of sacral region, stage 4 01/07/2022 No Yes L89.610 Pressure ulcer of right heel, unstageable 01/07/2022 No Yes E03.9 Hypothyroidism, unspecified 01/07/2022 No Yes Inactive Problems Resolved Problems Electronic Signature(s) Signed: 01/21/2022 2:05:44 PM By: [name] DO Entered By: [name] on 01/21/2022 13:45:35 -------------------------------------------------------------------------------- Progress Note Details Patient Name: Date of Service: [name] 01/21/2022 12:45 PM Medical Record Number: [id number] Patient Account Number: [id number] Date of Birth/Sex: Treating RN: 1930/02/24 (88 y.o. [name] Primary Care Provider: [name] Other Clinician: Referring Provider: Treating  Provider/Extender: [name] in Treatment: 2 Subjective Chief Complaint Information obtained from Patient Sacral and left heel pressure ulcers History of Present Illness (HPI) Admission 01/07/2022 [name] is an 88 year old male with a past medical history of prostate cancer, hypothyroidism and celiac disease that presents to the clinic for a 1 month history of ulcers to his sacrum and right heel. He was admitted to the hospital on 12/05/2021 for rhabdomyolysis secondary to being on the floor for 2 days without being able to get up. He developed a pressure ulcer to his sacrum and right heel. In the hospital they were doing hydrotherapy. He was discharged on 12/27 to a skilled nursing facility. He has been using wet-to-dry dressings with Santyl to the sacrum and Betadine to the right heel wound. He currently denies systemic signs of infection. He has Prevalon boots and a group 2 air mattress. He has a hard time offloading the sacrum and is on his back most of the day. 2/2; patient presents for follow-up. Santyl has been used to the right heel wound and Dakin's wet-to-dry dressings to the sacral wound. He states he had imaging done to both wound bed areas. He has no issues or complaints today. He denies signs of infection. Patient History Information obtained from Patient. Family History Unknown History. Social History Never smoker, Marital Status - Widowed, Alcohol Use - Never, Drug Use - No History, Caffeine Use - Never. Medical History Eyes Patient has history of Cataracts - surgery on both eyes Denies history of Glaucoma, Optic Neuritis Cardiovascular Patient has history of Arrhythmia - A.Fib on Eliquis Denies history of Angina, Congestive Heart Failure, Coronary Artery Disease, Deep Vein Thrombosis, Hypertension, Hypotension, Myocardial Infarction, Peripheral Arterial Disease, Peripheral Venous Disease, Phlebitis, Vasculitis Endocrine Denies  history of Type I Diabetes, Type II Diabetes Immunological Denies history of Lupus Erythematosus, Raynaudoos, Scleroderma Integumentary (Skin) Denies history of History of Burn Musculoskeletal Denies history of Gout, Rheumatoid Arthritis, Osteoarthritis, Osteomyelitis Neurologic Denies history of Dementia, Neuropathy, Quadriplegia, Paraplegia, Seizure Disorder Oncologic Denies history of Received Chemotherapy, Received Radiation Medical A Surgical History Notes nd Cardiovascular Hx Mitral regurgitation Endocrine Hx Hypothyroidism Genitourinary Recent UTI (had sepsis) CKD stage 2 Musculoskeletal Debility/Deconditioning Neurologic Pt. c/o Nerve damage in 2 fingers on Left hand (after fall) Oncologic Prostate cancer (had "seeds") Objective Constitutional respirations regular, non-labored and within target range for patient.. Vitals Time Taken: 12:59 PM, Height: 73 in, Weight: 145 lbs, BMI: 19.1, Temperature: 97.4 F, Pulse: 67 bpm, Respiratory Rate: 18 breaths/min, Blood Pressure: 104/64 mmHg. Cardiovascular 2+ dorsalis pedis/posterior tibialis pulses. Psychiatric pleasant and cooperative. General Notes: Right lower extremity: T the heel there is an open wound with non viable  tissue throughout. T the sacrum there is an open wound with o o granulation tissue, undermining and minimal tendon exposed. No surrounding signs of infection. Integumentary (Hair, Skin) Wound #1 status is Open. Original cause of wound was Pressure Injury. The date acquired was: 12/02/2021. The wound has been in treatment 2 weeks. The wound is located on the Right Calcaneus. The wound measures 2.2cm length x 2.1cm width x 0.3cm depth; 3.629cm^2 area and 1.089cm^3 volume. There is Fat Layer (Subcutaneous Tissue) exposed. There is no tunneling noted, however, there is undermining starting at 4:00 and ending at 8:00 with a maximum distance of 0.3cm. There is a medium amount of serosanguineous drainage noted.  The wound margin is distinct with the outline attached to the wound base. There is small (1-33%) pink granulation within the wound bed. There is a large (67-100%) amount of necrotic tissue within the wound bed including Adherent Slough. Wound #2 status is Open. Original cause of wound was Pressure Injury. The date acquired was: 12/02/2021. The wound has been in treatment 2 weeks. The wound is located on the Sacrum. The wound measures 6.6cm length x 7cm width x 1.5cm depth; 36.285cm^2 area and 54.428cm^3 volume. There is tendon and Fat Layer (Subcutaneous Tissue) exposed. Tunneling has been noted at 6:00 with a maximum distance of 3cm. There is additional tunneling and at 7:00 with a maximum distance of 2.2cm. Undermining begins at 5:00 and ends at 8:00 with a maximum distance of 2cm. There is a medium amount of serosanguineous drainage noted. The wound margin is well defined and not attached to the wound base. There is large (67-100%) red granulation within the wound bed. There is a small (1-33%) amount of necrotic tissue within the wound bed including Adherent Slough. Assessment Active Problems ICD-10 Pressure ulcer of sacral region, stage 4 Pressure ulcer of right heel, unstageable Hypothyroidism, unspecified Patient's wounds have shown improvement in size and appearance since last clinic visit. T the right heel I debrided nonviable tissue. Both wound beds do not o show evidence of surrounding infection. He had x-rays done of the right heel and sacrum however we do not have these results. We have called over to the facility to have these faxed over. For now I recommended previous treatment of Dakin's wet-to-dry to the sacrum and Santyl to the right heel wound. Also recommended aggressive offloading. Follow-up in 2 weeks. Procedures Wound #1 Pre-procedure diagnosis of Wound #1 is a Pressure Ulcer located on the Right Calcaneus . There was a Excisional Skin/Subcutaneous Tissue Debridement  with a total area of 4.62 sq cm performed by [name], DO. With the following instrument(s): Curette to remove Non-Viable tissue/material. Material removed includes Subcutaneous Tissue and Slough and after achieving pain control using Other (benzocaine 20%). No specimens were taken. A time out was conducted at 13:19, prior to the start of the procedure. A Minimum amount of bleeding was controlled with Pressure. The procedure was tolerated well. Post Debridement Measurements: 2.2cm length x 2.1cm width x 0.3cm depth; 1.089cm^3 volume. Post debridement Stage noted as Unstageable/Unclassified. Character of Wound/Ulcer Post Debridement is stable. Post procedure Diagnosis Wound #1: Same as Pre-Procedure Plan Follow-up Appointments: Return Appointment in 2 weeks. - Dr Heber [place] Other: - ** Please fax xray results to [phone]** Bathing/ Shower/ Hygiene: Other Bathing/Shower/Hygiene Orders/Instructions: - May have a sponge bath or shower Edema Control - Lymphedema / SCD / Other: Elevate legs to the level of the heart or above for 30 minutes daily and/or when sitting, a frequency of: - Throughout  the day Moisturize legs daily. Off-Loading: Heel suspension boot to: - Prevalon Boots to bilateral feet Low air-loss mattress (Group 2) Gel wheelchair cushion Turn and reposition every 2 hours Additional Orders / Instructions: Follow Nutritious Diet - High protein for wound healing WOUND #1: - Calcaneus Wound Laterality: Right Cleanser: Wound Cleanser 1 x Per Day/15 Days Discharge Instructions: Cleanse the wound with wound cleanser prior to applying a clean dressing using gauze sponges, not tissue or cotton balls. Peri-Wound Care: Zinc Oxide Ointment 30g tube 1 x Per Day/15 Days Discharge Instructions: Apply Zinc Oxide to periwound with each dressing change Prim Dressing: Santyl Ointment 1 x Per Day/15 Days ary Discharge Instructions: Apply nickel thick amount to wound bed as  instructed Secondary Dressing: Woven Gauze Sponge, Non-Sterile 4x4 in 1 x Per Day/15 Days Discharge Instructions: Apply over primary dressing as directed. Secondary Dressing: ALLEVYN Heel 4 1/2in x 5 1/2in / 10.5cm x 13.5cm 1 x Per Day/15 Days Discharge Instructions: Apply over primary dressing as directed. Secured With: [employer], 4.5x3.1 (in/yd) 1 x Per Day/15 Days Discharge Instructions: Secure with Kerlix as directed. Secured With: 11M Medipore H Soft Cloth Surgical T ape, 4 x 10 (in/yd) 1 x Per Day/15 Days Discharge Instructions: Secure with tape as directed. WOUND #2: - Sacrum Wound Laterality: Cleanser: Wound Cleanser 2 x Per Day/15 Days Discharge Instructions: Cleanse the wound with wound cleanser prior to applying a clean dressing using gauze sponges, not tissue or cotton balls. Peri-Wound Care: Zinc Oxide Ointment 30g tube 2 x Per Day/15 Days Discharge Instructions: Apply Zinc Oxide to periwound with each dressing change Prim Dressing: Dakin's Solution 0.125%, 16 (oz) 2 x Per Day/15 Days ary Discharge Instructions: Moisten gauze with Dakin's solution Secondary Dressing: MPM [name] SAP Bordered Dressing, 7x6.7 (Sacral) (in/in) 2 x Per Day/15 Days Discharge Instructions: Apply silicone border over primary dressing as directed. 1. In office sharp debridement 2. Santyl 3. Dakin's wet-to-dry 4. Aggressive offloading 5. Follow-up in 2 weeks 6. Follow-up x-ray results Electronic Signature(s) Signed: 01/21/2022 2:05:44 PM By: [name] DO Entered By: [name] on 01/21/2022 14:04:40 -------------------------------------------------------------------------------- HxROS Details Patient Name: Date of Service: [name] 01/21/2022 12:45 PM Medical Record Number: [id number] Patient Account Number: [id number] Date of Birth/Sex: Treating RN: 10/04/1930 (88 y.o. [name] Primary Care Provider: [name] Other Clinician: Referring  Provider: Treating Provider/Extender: [name] in Treatment: 2 Information Obtained From Patient Eyes Medical History: Positive for: Cataracts - surgery on both eyes Negative for: Glaucoma; Optic Neuritis Cardiovascular Medical History: Positive for: Arrhythmia - A.Fib on Eliquis Negative for: Angina; Congestive Heart Failure; Coronary Artery Disease; Deep Vein Thrombosis; Hypertension; Hypotension; Myocardial Infarction; Peripheral Arterial Disease; Peripheral Venous Disease; Phlebitis; Vasculitis Past Medical History Notes: Hx Mitral regurgitation Endocrine Medical History: Negative for: Type I Diabetes; Type II Diabetes Past Medical History Notes: Hx Hypothyroidism Genitourinary Medical History: Past Medical History Notes: Recent UTI (had sepsis) CKD stage 2 Immunological Medical History: Negative for: Lupus Erythematosus; Raynauds; Scleroderma Integumentary (Skin) Medical History: Negative for: History of Burn Musculoskeletal Medical History: Negative for: Gout; Rheumatoid Arthritis; Osteoarthritis; Osteomyelitis Past Medical History Notes: Debility/Deconditioning Neurologic Medical History: Negative for: Dementia; Neuropathy; Quadriplegia; Paraplegia; Seizure Disorder Past Medical History Notes: Pt. c/o Nerve damage in 2 fingers on Left hand (after fall) Oncologic Medical History: Negative for: Received Chemotherapy; Received Radiation Past Medical History Notes: Prostate cancer (had "seeds") HBO Extended History Items Eyes: Cataracts Immunizations Pneumococcal Vaccine: Received Pneumococcal Vaccination: Yes Received Pneumococcal Vaccination On  or After 60th Birthday: Yes Implantable Devices No devices added Family and Social History Unknown History: Yes; Never smoker; Marital Status - Widowed; Alcohol Use: Never; Drug Use: No History; Caffeine Use: Never; Financial Concerns: No; Food, Clothing or Shelter Needs: No; Support  System Lacking: No; Transportation Concerns: No Electronic Signature(s) Signed: 01/21/2022 2:05:44 PM By: [name] DO Signed: 01/22/2022 1:32:11 PM By: [name] Entered By: [name] on 01/21/2022 13:47:37 -------------------------------------------------------------------------------- [place] Details Patient Name: Date of Service: [name] 01/21/2022 Medical Record Number: [id number] Patient Account Number: [id number] Date of Birth/Sex: Treating RN: [birth year] (88 y.o. [name] Primary Care Provider: [name] Other Clinician: Referring Provider: Treating Provider/Extender: [name] in Treatment: 2 Diagnosis Coding ICD-10 Codes Code Description [phone] Pressure ulcer of sacral region, stage 4 L89.610 Pressure ulcer of right heel, unstageable E03.9 Hypothyroidism, unspecified Facility Procedures CPT4 Code: [id number] Description: 62130 - DEB SUBQ TISSUE 20 SQ CM/< ICD-10 Diagnosis Description L89.610 Pressure ulcer of right heel, unstageable Modifier: Quantity: 1 Physician Procedures : CPT4 Code Description Modifier [id number] - WC PHYS SUBQ TISS 20 SQ CM ICD-10 Diagnosis Description L89.610 Pressure ulcer of right heel, unstageable Quantity: 1 Electronic Signature(s) Signed: 01/21/2022 2:05:44 PM By: [name] DO Entered By: [name] on 01/21/2022 14:04:53

## 2022-01-25 DIAGNOSIS — F4323 Adjustment disorder with mixed anxiety and depressed mood: Secondary | ICD-10-CM | POA: Diagnosis not present

## 2022-02-01 DIAGNOSIS — F4323 Adjustment disorder with mixed anxiety and depressed mood: Secondary | ICD-10-CM | POA: Diagnosis not present

## 2022-02-04 ENCOUNTER — Other Ambulatory Visit: Payer: Self-pay

## 2022-02-04 ENCOUNTER — Encounter (HOSPITAL_BASED_OUTPATIENT_CLINIC_OR_DEPARTMENT_OTHER): Payer: Medicare Other | Admitting: Internal Medicine

## 2022-02-04 DIAGNOSIS — L89154 Pressure ulcer of sacral region, stage 4: Secondary | ICD-10-CM | POA: Diagnosis not present

## 2022-02-04 DIAGNOSIS — L89610 Pressure ulcer of right heel, unstageable: Secondary | ICD-10-CM | POA: Diagnosis not present

## 2022-02-04 DIAGNOSIS — Z8546 Personal history of malignant neoplasm of prostate: Secondary | ICD-10-CM | POA: Diagnosis not present

## 2022-02-04 DIAGNOSIS — N182 Chronic kidney disease, stage 2 (mild): Secondary | ICD-10-CM | POA: Diagnosis not present

## 2022-02-04 DIAGNOSIS — E039 Hypothyroidism, unspecified: Secondary | ICD-10-CM

## 2022-02-04 DIAGNOSIS — Z7901 Long term (current) use of anticoagulants: Secondary | ICD-10-CM | POA: Diagnosis not present

## 2022-02-04 NOTE — Progress Notes (Signed)
[name] ([id number]) Visit Report for 02/04/2022 Chief Complaint Document Details Patient Name: Date of Service: [name], [name] 02/04/2022 12:30 PM Medical Record Number: [id number] Patient Account Number: [id number] Date of Birth/Sex: Treating RN: [birth year] (88 y.o. M) Primary Care Provider: [name] Other Clinician: Referring Provider: Treating Provider/Extender: [name] in Treatment: 4 Information Obtained from: Patient Chief Complaint Sacral and left heel pressure ulcers Electronic Signature(s) Signed: 02/04/2022 2:06:47 PM By: [name] DO Entered By: [name] on 02/04/2022 13:58:49 -------------------------------------------------------------------------------- HPI Details Patient Name: Date of Service: [name] 02/04/2022 12:30 PM Medical Record Number: [id number] Patient Account Number: [id number] Date of Birth/Sex: Treating RN: [birth year] (88 y.o. M) Primary Care Provider: [name] Other Clinician: Referring Provider: Treating Provider/Extender: [name] in Treatment: 4 History of Present Illness HPI Description: Admission 01/07/2022 [name] is an 88 year old male with a past medical history of prostate cancer, hypothyroidism and celiac disease that presents to the clinic for a 1 month history of ulcers to his sacrum and right heel. He was admitted to the hospital on 12/05/2021 for rhabdomyolysis secondary to being on the floor for 2 days without being able to get up. He developed a pressure ulcer to his sacrum and right heel. In the hospital they were doing hydrotherapy. He was discharged on 12/27 to a skilled nursing facility. He has been using wet-to-dry dressings with Santyl to the sacrum and Betadine to the right heel wound. He currently denies systemic signs of infection. He has Prevalon boots and a group 2 air mattress. He has a hard time offloading the sacrum  and is on his back most of the day. 2/2; patient presents for follow-up. Santyl has been used to the right heel wound and Dakin's wet-to-dry dressings to the sacral wound. He states he had imaging done to both wound bed areas. He has no issues or complaints today. He denies signs of infection. 2/16; patient presents for follow-up. He is using Dakin's to the sacral wound and Santyl to the left heel wound. He has no issues or complaints today. He denies signs of infection. Unfortunately he is unable to offload the sacral wound bed. He either sits in the bed all day or he is in a wheelchair. Electronic Signature(s) Signed: 02/04/2022 2:06:47 PM By: [name] DO Entered By: [name] on 02/04/2022 13:59:36 -------------------------------------------------------------------------------- Physical Exam Details Patient Name: Date of Service: [name], [name] 02/04/2022 12:30 PM Medical Record Number: [id number] Patient Account Number: [id number] Date of Birth/Sex: Treating RN: October 23, 1930 (88 y.o. M) Primary Care Provider: [name] Other Clinician: Referring Provider: Treating Provider/Extender: [name] in Treatment: 4 Constitutional respirations regular, non-labored and within target range for patient.. Cardiovascular 2+ dorsalis pedis/posterior tibialis pulses. Psychiatric pleasant and cooperative. Notes Right lower extremity: T the heel there is an open wound with granulation tissue throughout. o T the sacrum there is an open wound with granulation tissue and undermining. No probing to bone although this overlies bone. o Electronic Signature(s) Signed: 02/04/2022 2:06:47 PM By: [name] DO Entered By: [name] on 02/04/2022 14:00:25 -------------------------------------------------------------------------------- Physician Orders Details Patient Name: Date of Service: [name] 02/04/2022 12:30 PM Medical Record  Number: [id number] Patient Account Number: [id number] Date of Birth/Sex: Treating RN: 08/22/1930 (88 y.o. [place] Noe Primary Care Provider: [name] Other Clinician: Referring Provider: Treating Provider/Extender: [name] in Treatment: 4 Verbal / Phone Orders: No Diagnosis Coding Follow-up  Appointments ppointment in 2 weeks. - Dr Heber [place] Return A Other: - ** Please fax xray results to [phone]** Bathing/ Shower/ Hygiene Other Bathing/Shower/Hygiene Orders/Instructions: - May have a sponge bath or shower Edema Control - Lymphedema / SCD / Other Right Lower Extremity Elevate legs to the level of the heart or above for 30 minutes daily and/or when sitting, a frequency of: - Throughout the day Moisturize legs daily. Off-Loading Heel suspension boot to: - Prevalon Boots to bilateral feet Low air-loss mattress (Group 2) Gel wheelchair cushion Turn and reposition every 2 hours Additional Orders / Instructions Follow Nutritious Diet - High protein for wound healing Wound Treatment Wound #1 - Calcaneus Wound Laterality: Right Cleanser: Wound Cleanser 1 x Per Day/15 Days Discharge Instructions: Cleanse the wound with wound cleanser prior to applying a clean dressing using gauze sponges, not tissue or cotton balls. Peri-Wound Care: Zinc Oxide Ointment 30g tube 1 x Per Day/15 Days Discharge Instructions: Apply Zinc Oxide to periwound with each dressing change Prim Dressing: Hydrofera Blue Classic Foam, 4x4 in ary 1 x Per Day/15 Days Discharge Instructions: Moisten with saline prior to applying to wound bed Secondary Dressing: Woven Gauze Sponge, Non-Sterile 4x4 in 1 x Per Day/15 Days Discharge Instructions: Apply over primary dressing as directed. Secondary Dressing: ALLEVYN Heel 4 1/2in x 5 1/2in / 10.5cm x 13.5cm 1 x Per Day/15 Days Discharge Instructions: Apply over primary dressing as directed. Secured With: [employer], 4.5x3.1  (in/yd) 1 x Per Day/15 Days Discharge Instructions: Secure with Kerlix as directed. Secured With: 58M Medipore H Soft Cloth Surgical T ape, 4 x 10 (in/yd) 1 x Per Day/15 Days Discharge Instructions: Secure with tape as directed. Wound #2 - Sacrum Cleanser: Wound Cleanser 2 x Per WGN/56 Days Discharge Instructions: Cleanse the wound with wound cleanser prior to applying a clean dressing using gauze sponges, not tissue or cotton balls. Peri-Wound Care: Zinc Oxide Ointment 30g tube 2 x Per Day/15 Days Discharge Instructions: Apply Zinc Oxide to periwound with each dressing change Prim Dressing: Dakin's Solution 0.125%, 16 (oz) 2 x Per Day/15 Days ary Discharge Instructions: Moisten gauze with Dakin's solution Secondary Dressing: MPM [name] SAP Bordered Dressing, 7x6.7 (Sacral) (in/in) 2 x Per Day/15 Days Discharge Instructions: Apply silicone border over primary dressing as directed. Electronic Signature(s) Signed: 02/04/2022 2:06:47 PM By: [name] DO Entered By: [name] on 02/04/2022 14:00:44 -------------------------------------------------------------------------------- Problem List Details Patient Name: Date of Service: [name] 02/04/2022 12:30 PM Medical Record Number: [id number] Patient Account Number: [id number] Date of Birth/Sex: Treating RN: Oct 01, 1930 (88 y.o. M) Primary Care Provider: [name] Other Clinician: Referring Provider: Treating Provider/Extender: [name] in Treatment: 4 Active Problems ICD-10 Encounter Code Description Active Date MDM Diagnosis L89.154 Pressure ulcer of sacral region, stage 4 01/07/2022 No Yes L89.610 Pressure ulcer of right heel, unstageable 01/07/2022 No Yes E03.9 Hypothyroidism, unspecified 01/07/2022 No Yes Inactive Problems Resolved Problems Electronic Signature(s) Signed: 02/04/2022 2:06:47 PM By: [name] DO Signed: 02/04/2022 2:06:47 PM By: [name] DO Entered  By: [name] on 02/04/2022 13:58:30 -------------------------------------------------------------------------------- Progress Note Details Patient Name: Date of Service: [name] 02/04/2022 12:30 PM Medical Record Number: [id number] Patient Account Number: [id number] Date of Birth/Sex: Treating RN: 1930/03/10 (88 y.o. M) Primary Care Provider: [name] Other Clinician: Referring Provider: Treating Provider/Extender: [name] in Treatment: 4 Subjective Chief Complaint Information obtained from Patient Sacral and left heel pressure ulcers History of Present Illness (HPI) Admission 01/07/2022 Mr.  Clemons Bok is an 88 year old male with a past medical history of prostate cancer, hypothyroidism and celiac disease that presents to the clinic for a 1 month history of ulcers to his sacrum and right heel. He was admitted to the hospital on 12/05/2021 for rhabdomyolysis secondary to being on the floor for 2 days without being able to get up. He developed a pressure ulcer to his sacrum and right heel. In the hospital they were doing hydrotherapy. He was discharged on 12/27 to a skilled nursing facility. He has been using wet-to-dry dressings with Santyl to the sacrum and Betadine to the right heel wound. He currently denies systemic signs of infection. He has Prevalon boots and a group 2 air mattress. He has a hard time offloading the sacrum and is on his back most of the day. 2/2; patient presents for follow-up. Santyl has been used to the right heel wound and Dakin's wet-to-dry dressings to the sacral wound. He states he had imaging done to both wound bed areas. He has no issues or complaints today. He denies signs of infection. 2/16; patient presents for follow-up. He is using Dakin's to the sacral wound and Santyl to the left heel wound. He has no issues or complaints today. He denies signs of infection. Unfortunately he is unable to offload the  sacral wound bed. He either sits in the bed all day or he is in a wheelchair. Patient History Information obtained from Patient. Family History Unknown History. Social History Never smoker, Marital Status - Widowed, Alcohol Use - Never, Drug Use - No History, Caffeine Use - Never. Medical History Eyes Patient has history of Cataracts - surgery on both eyes Denies history of Glaucoma, Optic Neuritis Cardiovascular Patient has history of Arrhythmia - A.Fib on Eliquis Denies history of Angina, Congestive Heart Failure, Coronary Artery Disease, Deep Vein Thrombosis, Hypertension, Hypotension, Myocardial Infarction, Peripheral Arterial Disease, Peripheral Venous Disease, Phlebitis, Vasculitis Endocrine Denies history of Type I Diabetes, Type II Diabetes Immunological Denies history of Lupus Erythematosus, Raynaudoos, Scleroderma Integumentary (Skin) Denies history of History of Burn Musculoskeletal Denies history of Gout, Rheumatoid Arthritis, Osteoarthritis, Osteomyelitis Neurologic Denies history of Dementia, Neuropathy, Quadriplegia, Paraplegia, Seizure Disorder Oncologic Denies history of Received Chemotherapy, Received Radiation Medical A Surgical History Notes nd Cardiovascular Hx Mitral regurgitation Endocrine Hx Hypothyroidism Genitourinary Recent UTI (had sepsis) CKD stage 2 Musculoskeletal Debility/Deconditioning Neurologic Pt. c/o Nerve damage in 2 fingers on Left hand (after fall) Oncologic Prostate cancer (had "seeds") Objective Constitutional respirations regular, non-labored and within target range for patient.. Vitals Time Taken: 1:00 PM, Height: 73 in, Weight: 145 lbs, BMI: 19.1, Temperature: 98.7 F, Pulse: 64 bpm, Respiratory Rate: 17 breaths/min, Blood Pressure: 122/73 mmHg. Cardiovascular 2+ dorsalis pedis/posterior tibialis pulses. Psychiatric pleasant and cooperative. General Notes: Right lower extremity: T the heel there is an open wound with  granulation tissue throughout. T the sacrum there is an open wound with o o granulation tissue and undermining. No probing to bone although this overlies bone. Integumentary (Hair, Skin) Wound #1 status is Open. Original cause of wound was Pressure Injury. The date acquired was: 12/02/2021. The wound has been in treatment 4 weeks. The wound is located on the Right Calcaneus. The wound measures 2.5cm length x 1.4cm width x 0.3cm depth; 2.749cm^2 area and 0.825cm^3 volume. There is Fat Layer (Subcutaneous Tissue) exposed. There is no tunneling or undermining noted. There is a medium amount of serosanguineous drainage noted. The wound margin is distinct with the outline attached to the wound base. There  is large (67-100%) red, pink granulation within the wound bed. There is a small (1- 33%) amount of necrotic tissue within the wound bed including Adherent Slough. Wound #2 status is Open. Original cause of wound was Pressure Injury. The date acquired was: 12/02/2021. The wound has been in treatment 4 weeks. The wound is located on the Sacrum. The wound measures 5.3cm length x 6cm width x 2.4cm depth; 24.976cm^2 area and 59.942cm^3 volume. There is tendon and Fat Layer (Subcutaneous Tissue) exposed. There is no tunneling noted, however, there is undermining starting at 3:00 and ending at 9:00 with a maximum distance of 3.8cm. There is a medium amount of serosanguineous drainage noted. The wound margin is well defined and not attached to the wound base. There is large (67-100%) red granulation within the wound bed. There is no necrotic tissue within the wound bed. Assessment Active Problems ICD-10 Pressure ulcer of sacral region, stage 4 Pressure ulcer of right heel, unstageable Hypothyroidism, unspecified Patient's sacral wound is slightly deeper. He is unable to offload this area. The tissue appears healthy though with granulation tissue throughout. I recommended continuing Dakin's wet-to-dry  dressings. We had a long discussion about the importance of aggressive offloading and wound healing. He expressed understanding. The left heel wound has shown great improvement in size and appearance since last clinic visit. At this time he can switch from [facility] to [facility]. He has Prevalon boots. I recommended continuing to aggressively offload the heel. No signs of infection to either wound bed.We still do not have the x-rays completed at his facility for the left heel and the sacrum. We will again request these. Plan Follow-up Appointments: Return Appointment in 2 weeks. - Dr Heber [place] Other: - ** Please fax xray results to [phone]** Bathing/ Shower/ Hygiene: Other Bathing/Shower/Hygiene Orders/Instructions: - May have a sponge bath or shower Edema Control - Lymphedema / SCD / Other: Elevate legs to the level of the heart or above for 30 minutes daily and/or when sitting, a frequency of: - Throughout the day Moisturize legs daily. Off-Loading: Heel suspension boot to: - Prevalon Boots to bilateral feet Low air-loss mattress (Group 2) Gel wheelchair cushion Turn and reposition every 2 hours Additional Orders / Instructions: Follow Nutritious Diet - High protein for wound healing WOUND #1: - Calcaneus Wound Laterality: Right Cleanser: Wound Cleanser 1 x Per Day/15 Days Discharge Instructions: Cleanse the wound with wound cleanser prior to applying a clean dressing using gauze sponges, not tissue or cotton balls. Peri-Wound Care: Zinc Oxide Ointment 30g tube 1 x Per Day/15 Days Discharge Instructions: Apply Zinc Oxide to periwound with each dressing change Prim Dressing: Hydrofera Blue Classic Foam, 4x4 in 1 x Per Day/15 Days ary Discharge Instructions: Moisten with saline prior to applying to wound bed Secondary Dressing: Woven Gauze Sponge, Non-Sterile 4x4 in 1 x Per Day/15 Days Discharge Instructions: Apply over primary dressing as directed. Secondary Dressing: ALLEVYN Heel  4 1/2in x 5 1/2in / 10.5cm x 13.5cm 1 x Per Day/15 Days Discharge Instructions: Apply over primary dressing as directed. Secured With: [employer], 4.5x3.1 (in/yd) 1 x Per Day/15 Days Discharge Instructions: Secure with Kerlix as directed. Secured With: 55M Medipore H Soft Cloth Surgical T ape, 4 x 10 (in/yd) 1 x Per Day/15 Days Discharge Instructions: Secure with tape as directed. WOUND #2: - Sacrum Wound Laterality: Cleanser: Wound Cleanser 2 x Per Day/15 Days Discharge Instructions: Cleanse the wound with wound cleanser prior to applying a clean dressing using gauze sponges, not tissue or  cotton balls. Peri-Wound Care: Zinc Oxide Ointment 30g tube 2 x Per Day/15 Days Discharge Instructions: Apply Zinc Oxide to periwound with each dressing change Prim Dressing: Dakin's Solution 0.125%, 16 (oz) 2 x Per Day/15 Days ary Discharge Instructions: Moisten gauze with Dakin's solution Secondary Dressing: MPM [name] SAP Bordered Dressing, 7x6.7 (Sacral) (in/in) 2 x Per Day/15 Days Discharge Instructions: Apply silicone border over primary dressing as directed. 1. Aggressive offloading 2. Hydrofera Blue 3. Dakin's wet-to-dry 4. Follow-up in 2 weeks Electronic Signature(s) Signed: 02/04/2022 2:06:47 PM By: [name] DO Entered By: [name] on 02/04/2022 14:03:26 -------------------------------------------------------------------------------- HxROS Details Patient Name: Date of Service: [name] 02/04/2022 12:30 PM Medical Record Number: [id number] Patient Account Number: [id number] Date of Birth/Sex: Treating RN: 09/15/1930 (88 y.o. M) Primary Care Provider: [name] Other Clinician: Referring Provider: Treating Provider/Extender: [name] in Treatment: 4 Information Obtained From Patient Eyes Medical History: Positive for: Cataracts - surgery on both eyes Negative for: Glaucoma; Optic Neuritis Cardiovascular Medical  History: Positive for: Arrhythmia - A.Fib on Eliquis Negative for: Angina; Congestive Heart Failure; Coronary Artery Disease; Deep Vein Thrombosis; Hypertension; Hypotension; Myocardial Infarction; Peripheral Arterial Disease; Peripheral Venous Disease; Phlebitis; Vasculitis Past Medical History Notes: Hx Mitral regurgitation Endocrine Medical History: Negative for: Type I Diabetes; Type II Diabetes Past Medical History Notes: Hx Hypothyroidism Genitourinary Medical History: Past Medical History Notes: Recent UTI (had sepsis) CKD stage 2 Immunological Medical History: Negative for: Lupus Erythematosus; Raynauds; Scleroderma Integumentary (Skin) Medical History: Negative for: History of Burn Musculoskeletal Medical History: Negative for: Gout; Rheumatoid Arthritis; Osteoarthritis; Osteomyelitis Past Medical History Notes: Debility/Deconditioning Neurologic Medical History: Negative for: Dementia; Neuropathy; Quadriplegia; Paraplegia; Seizure Disorder Past Medical History Notes: Pt. c/o Nerve damage in 2 fingers on Left hand (after fall) Oncologic Medical History: Negative for: Received Chemotherapy; Received Radiation Past Medical History Notes: Prostate cancer (had "seeds") HBO Extended History Items Eyes: Cataracts Immunizations Pneumococcal Vaccine: Received Pneumococcal Vaccination: Yes Received Pneumococcal Vaccination On or After 60th Birthday: Yes Implantable Devices No devices added Family and Social History Unknown History: Yes; Never smoker; Marital Status - Widowed; Alcohol Use: Never; Drug Use: No History; Caffeine Use: Never; Financial Concerns: No; Food, Clothing or Shelter Needs: No; Support System Lacking: No; Transportation Concerns: No Electronic Signature(s) Signed: 02/04/2022 2:06:47 PM By: [name] DO Entered By: [name] on 02/04/2022  13:59:45 -------------------------------------------------------------------------------- SuperBill Details Patient Name: Date of Service: [name] 02/04/2022 Medical Record Number: [id number] Patient Account Number: [id number] Date of Birth/Sex: Treating RN: 10/12/1930 (88 y.o. Burnadette Pop, Lauren Primary Care Provider: [name] Other Clinician: Referring Provider: Treating Provider/Extender: [name] in Treatment: 4 Diagnosis Coding ICD-10 Codes Code Description [phone] Pressure ulcer of sacral region, stage 4 L89.610 Pressure ulcer of right heel, unstageable E03.9 Hypothyroidism, unspecified Facility Procedures CPT4 Code: [id number] Description: [phone] - WOUND CARE VISIT-LEV 5 EST PT Modifier: Quantity: 1 Physician Procedures : CPT4 Code Description Modifier [id number] - WC PHYS LEVEL 3 - EST PT ICD-10 Diagnosis Description L89.154 Pressure ulcer of sacral region, stage 4 L89.610 Pressure ulcer of right heel, unstageable E03.9 Hypothyroidism, unspecified Quantity: 1 Electronic Signature(s) Signed: 02/04/2022 2:06:47 PM By: [name] DO Entered By: [name] on 02/04/2022 14:03:52

## 2022-02-04 NOTE — Progress Notes (Signed)
[name], [name] ([id number]) Visit Report for 02/04/2022 Arrival Information Details Patient Name: Date of Service: [name], [name] 02/04/2022 12:30 PM Medical Record Number: [id number] Patient Account Number: [id number] Date of Birth/Sex: Treating RN: [birth year] (88 y.o. [name], [name] Primary Care [name]: [name] Other Clinician: Referring [name]: Treating [name]/Extender: [name] in Treatment: 4 Visit Information History Since Last Visit Added or deleted any medications: No Patient Arrived: Wheel Chair Any new allergies or adverse reactions: No Arrival Time: 12:51 Had a fall or experienced change in No Accompanied By: sons activities of daily living that may affect Transfer Assistance: Manual risk of falls: Patient Identification Verified: Yes Signs or symptoms of abuse/neglect since last visito No Secondary Verification Process Completed: Yes Hospitalized since last visit: No Patient Has Alerts: Yes Implantable device outside of the clinic excluding No Patient Alerts: Patient on Blood Thinner cellular tissue based products placed in the center R ABI non compressible since last visit: Has Dressing in Place as Prescribed: Yes Pain Present Now: No Electronic Signature(s) Signed: 02/04/2022 5:08:54 PM By: Jerry Hammock RN Entered By: [name] on 02/04/2022 12:51:59 -------------------------------------------------------------------------------- Clinic Level of Care Assessment Details Patient Name: Date of Service: Jerry, [name] 02/04/2022 12:30 PM Medical Record Number: [id number] Patient Account Number: [id number] Date of Birth/Sex: Treating RN: 03-27-1930 (88 y.o. [name], [name] Primary Care [name]: [name] Other Clinician: Referring [name]: Treating Marriah Sanderlin/Extender: [name] in Treatment: 4 Clinic Level of Care Assessment Items TOOL 4 Quantity Score X- 1  0 Use when only an EandM is performed on FOLLOW-UP visit ASSESSMENTS - Nursing Assessment / Reassessment X- 1 10 Reassessment of Co-morbidities (includes updates in patient status) X- 1 5 Reassessment of Adherence to Treatment Plan ASSESSMENTS - Wound and Skin A ssessment / Reassessment []  - 0 Simple Wound Assessment / Reassessment - one wound X- 2 5 Complex Wound Assessment / Reassessment - multiple wounds []  - 0 Dermatologic / Skin Assessment (not related to wound area) ASSESSMENTS - Focused Assessment X- 1 5 Circumferential Edema Measurements - multi extremities []  - 0 Nutritional Assessment / Counseling / Intervention []  - 0 Lower Extremity Assessment (monofilament, tuning fork, pulses) []  - 0 Peripheral Arterial Disease Assessment (using hand held doppler) ASSESSMENTS - Ostomy and/or Continence Assessment and Care []  - 0 Incontinence Assessment and Management []  - 0 Ostomy Care Assessment and Management (repouching, etc.) PROCESS - Coordination of Care []  - 0 Simple Patient / Family Education for ongoing care X- 1 20 Complex (extensive) Patient / Family Education for ongoing care X- 1 10 Staff obtains Programmer, systems, Records, T Results / Process Orders est X- 1 10 Staff telephones HHA, Nursing Homes / Clarify orders / etc []  - 0 Routine Transfer to another Facility (non-emergent condition) []  - 0 Routine Hospital Admission (non-emergent condition) []  - 0 New Admissions / Biomedical engineer / Ordering NPWT Apligraf, etc. , []  - 0 Emergency Hospital Admission (emergent condition) []  - 0 Simple Discharge Coordination X- 1 15 Complex (extensive) Discharge Coordination PROCESS - Special Needs []  - 0 Pediatric / Minor Patient Management []  - 0 Isolation Patient Management []  - 0 Hearing / Language / Visual special needs []  - 0 Assessment of Community assistance (transportation, D/C planning, etc.) []  - 0 Additional assistance / Altered mentation []  -  0 Support Surface(s) Assessment (bed, cushion, seat, etc.) INTERVENTIONS - Wound Cleansing / Measurement []  - 0 Simple Wound Cleansing - one wound X- 2 5 Complex Wound Cleansing -  multiple wounds X- 1 5 Wound Imaging (photographs - any number of wounds) []  - 0 Wound Tracing (instead of photographs) []  - 0 Simple Wound Measurement - one wound X- 2 5 Complex Wound Measurement - multiple wounds INTERVENTIONS - Wound Dressings []  - 0 Small Wound Dressing one or multiple wounds X- 2 15 Medium Wound Dressing one or multiple wounds []  - 0 Large Wound Dressing one or multiple wounds X- 1 5 Application of Medications - topical []  - 0 Application of Medications - injection INTERVENTIONS - Miscellaneous []  - 0 External ear exam []  - 0 Specimen Collection (cultures, biopsies, blood, body fluids, etc.) []  - 0 Specimen(s) / Culture(s) sent or taken to Lab for analysis X- 1 10 Patient Transfer (multiple staff / Civil Service fast streamer / Similar devices) []  - 0 Simple Staple / Suture removal (25 or less) []  - 0 Complex Staple / Suture removal (26 or more) []  - 0 Hypo / Hyperglycemic Management (close monitor of Blood Glucose) []  - 0 Ankle / Brachial Index (ABI) - do not check if billed separately X- 1 5 Vital Signs Has the patient been seen at the hospital within the last three years: Yes Total Score: 160 Level Of Care: New/Established - Level 5 Electronic Signature(s) Signed: 02/04/2022 5:08:54 PM By: Jerry Hammock RN Entered By: [name] on 02/04/2022 13:47:02 -------------------------------------------------------------------------------- Encounter Discharge Information Details Patient Name: Date of Service: [name], [name] 02/04/2022 12:30 PM Medical Record Number: [id number] Patient Account Number: [id number] Date of Birth/Sex: Treating RN: [birth year] (88 y.o. [name], [name] Primary Care [name]: [name] Other Clinician: Referring [name]: Treating  [name]/Extender: [name] in Treatment: 4 Encounter Discharge Information Items Discharge Condition: Stable Ambulatory Status: Wheelchair Discharge Destination: Skilled Nursing Facility Telephoned: No Orders Sent: Yes Transportation: Private Auto Accompanied By: son Schedule Follow-up Appointment: Yes Clinical Summary of Care: Patient Declined Electronic Signature(s) Signed: 02/04/2022 5:08:54 PM By: Jerry Hammock RN Entered By: [name] on 02/04/2022 13:48:31 -------------------------------------------------------------------------------- Lower Extremity Assessment Details Patient Name: Date of Service: [name] 02/04/2022 12:30 PM Medical Record Number: [id number] Patient Account Number: [id number] Date of Birth/Sex: Treating RN: July 22, 1930 (88 y.o. [name], [name] Primary Care Cortney Beissel: [name] Other Clinician: Referring Shantel Helwig: Treating Medrith Veillon/Extender: [name] in Treatment: 4 Edema Assessment Assessed: Shirlyn Goltz: No] Patrice Paradise: Yes] Edema: [Left: N] [Right: o] Calf Left: Right: Point of Measurement: 32 cm From Medial Instep 30 cm Ankle Left: Right: Point of Measurement: 12 cm From Medial Instep 21 cm Vascular Assessment Pulses: Dorsalis Pedis Palpable: [Right:Yes] Posterior Tibial Palpable: [Right:Yes] Electronic Signature(s) Signed: 02/04/2022 5:08:54 PM By: Jerry Hammock RN Entered By: [name] on 02/04/2022 12:52:24 -------------------------------------------------------------------------------- Multi Wound Chart Details Patient Name: Date of Service: [name] 02/04/2022 12:30 PM Medical Record Number: [id number] Patient Account Number: [id number] Date of Birth/Sex: Treating RN: 1930-09-17 (88 y.o. M) Primary Care Iori Gigante: [name] Other Clinician: Referring Marianita Botkin: Treating Colsen Modi/Extender: [name] in  Treatment: 4 Vital Signs Height(in): 73 Pulse(bpm): 23 Weight(lbs): 145 Blood Pressure(mmHg): 122/73 Body Mass Index(BMI): 19.1 Temperature(F): 98.7 Respiratory Rate(breaths/min): 17 Photos: [N/A:N/A] Right Calcaneus Sacrum N/A Wound Location: Pressure Injury Pressure Injury N/A Wounding Event: Pressure Ulcer Pressure Ulcer N/A Primary Etiology: Cataracts, Arrhythmia Cataracts, Arrhythmia N/A Comorbid History: 12/02/2021 12/02/2021 N/A Date Acquired: 4 4 N/A Weeks of Treatment: Open Open N/A Wound Status: No No N/A Wound Recurrence: 2.5x1.4x0.3 5.3x6x2.4 N/A Measurements L x W x D (cm) 2.749 24.976 N/A A (cm) :  rea 0.825 59.942 N/A Volume (cm) : 43.50% 51.20% N/A % Reduction in A rea: 43.50% 46.70% N/A % Reduction in Volume: 3 Starting Position 1 (o'clock): 9 Ending Position 1 (o'clock): 3.8 Maximum Distance 1 (cm): No Yes N/A Undermining: Unstageable/Unclassified Category/Stage IV N/A Classification: Medium Medium N/A Exudate A mount: Serosanguineous Serosanguineous N/A Exudate Type: red, brown red, brown N/A Exudate Color: Distinct, outline attached Well defined, not attached N/A Wound Margin: Large (67-100%) Large (67-100%) N/A Granulation A mount: Red, Pink Red N/A Granulation Quality: Small (1-33%) None Present (0%) N/A Necrotic A mount: Fat Layer (Subcutaneous Tissue): Yes Fat Layer (Subcutaneous Tissue): Yes N/A Exposed Structures: Fascia: No Tendon: Yes Tendon: No Fascia: No Muscle: No Muscle: No Joint: No Joint: No Bone: No Bone: No Small (1-33%) Small (1-33%) N/A Epithelialization: Treatment Notes Wound #1 (Calcaneus) Wound Laterality: Right Cleanser Wound Cleanser Discharge Instruction: Cleanse the wound with wound cleanser prior to applying a clean dressing using gauze sponges, not tissue or cotton balls. Peri-Wound Care Zinc Oxide Ointment 30g tube Discharge Instruction: Apply Zinc Oxide to periwound with each dressing  change Topical Primary Dressing Hydrofera Blue Classic Foam, 4x4 in Discharge Instruction: Moisten with saline prior to applying to wound bed Secondary Dressing Woven Gauze Sponge, Non-Sterile 4x4 in Discharge Instruction: Apply over primary dressing as directed. ALLEVYN Heel 4 1/2in x 5 1/2in / 10.5cm x 13.5cm Discharge Instruction: Apply over primary dressing as directed. Secured With [employer], 4.5x3.1 (in/yd) Discharge Instruction: Secure with Kerlix as directed. 71M Medipore H Soft Cloth Surgical T ape, 4 x 10 (in/yd) Discharge Instruction: Secure with tape as directed. Compression Wrap Compression Stockings Add-Ons Wound #2 (Sacrum) Cleanser Wound Cleanser Discharge Instruction: Cleanse the wound with wound cleanser prior to applying a clean dressing using gauze sponges, not tissue or cotton balls. Peri-Wound Care Zinc Oxide Ointment 30g tube Discharge Instruction: Apply Zinc Oxide to periwound with each dressing change Topical Primary Dressing Dakin's Solution 0.125%, 16 (oz) Discharge Instruction: Moisten gauze with Dakin's solution Secondary Dressing MPM [name] SAP Bordered Dressing, 7x6.7 (Sacral) (in/in) Discharge Instruction: Apply silicone border over primary dressing as directed. Secured With Compression Wrap Compression Stockings Add-Ons Electronic Signature(s) Signed: 02/04/2022 2:06:47 PM By: [name] DO Entered By: [name] on 02/04/2022 13:58:37 -------------------------------------------------------------------------------- Multi-Disciplinary Care Plan Details Patient Name: Date of Service: [name] 02/04/2022 12:30 PM Medical Record Number: [id number] Patient Account Number: [id number] Date of Birth/Sex: Treating RN: July 24, 1930 (88 y.o. [name], [name] Primary Care Valery Chance: [name] Other Clinician: Referring Steed Kanaan: Treating Lynnex Fulp/Extender: [name] in Treatment:  4 Multidisciplinary Care Plan reviewed with physician Active Inactive Abuse / Safety / Falls / Self Care Management Nursing Diagnoses: History of Falls Potential for injury related to falls Goals: Patient will not experience any injury related to falls Date Initiated: 01/07/2022 Target Resolution Date: 02/05/2022 Goal Status: Active Patient/caregiver will verbalize/demonstrate measures taken to prevent injury and/or falls Date Initiated: 01/07/2022 Target Resolution Date: 02/05/2022 Goal Status: Active Interventions: Assess Activities of Daily Living upon admission and as needed Assess fall risk on admission and as needed Assess: immobility, friction, shearing, incontinence upon admission and as needed Assess impairment of mobility on admission and as needed per policy Assess personal safety and home safety (as indicated) on admission and as needed Assess self care needs on admission and as needed Provide education on personal and home safety Notes: Pressure Nursing Diagnoses: Knowledge deficit related to causes and risk factors for pressure ulcer development Knowledge deficit related to management  of pressures ulcers Potential for impaired tissue integrity related to pressure, friction, moisture, and shear Goals: Patient/caregiver will verbalize risk factors for pressure ulcer development Date Initiated: 01/07/2022 Target Resolution Date: 02/05/2022 Goal Status: Active Patient/caregiver will verbalize understanding of pressure ulcer management Date Initiated: 01/07/2022 Target Resolution Date: 02/05/2022 Goal Status: Active Interventions: Assess: immobility, friction, shearing, incontinence upon admission and as needed Assess offloading mechanisms upon admission and as needed Assess potential for pressure ulcer upon admission and as needed Provide education on pressure ulcers Notes: Wound/Skin Impairment Nursing Diagnoses: Impaired tissue integrity Knowledge deficit related  to ulceration/compromised skin integrity Goals: Patient/caregiver will verbalize understanding of skin care regimen Date Initiated: 01/07/2022 Target Resolution Date: 02/05/2022 Goal Status: Active Ulcer/skin breakdown will have a volume reduction of 30% by week 4 Date Initiated: 01/07/2022 Target Resolution Date: 02/05/2022 Goal Status: Active Interventions: Assess patient/caregiver ability to obtain necessary supplies Assess patient/caregiver ability to perform ulcer/skin care regimen upon admission and as needed Assess ulceration(s) every visit Provide education on ulcer and skin care Notes: Electronic Signature(s) Signed: 02/04/2022 5:08:54 PM By: Jerry Hammock RN Entered By: [name] on 02/04/2022 13:37:11 -------------------------------------------------------------------------------- Pain Assessment Details Patient Name: Date of Service: [name] 02/04/2022 12:30 PM Medical Record Number: [id number] Patient Account Number: [id number] Date of Birth/Sex: Treating RN: 1930/06/30 (88 y.o. [name], [name] Primary Care Doneshia Hill: [name] Other Clinician: Referring Rashea Hoskie: Treating Evelisse Szalkowski/Extender: [name] in Treatment: 4 Active Problems Location of Pain Severity and Description of Pain Patient Has Paino No Site Locations Pain Management and Medication Current Pain Management: Electronic Signature(s) Signed: 02/04/2022 5:08:54 PM By: Jerry Hammock RN Entered By: [name] on 02/04/2022 12:52:08 -------------------------------------------------------------------------------- Patient/Caregiver Education Details Patient Name: Date of Service: [name] 2/16/2023andnbsp12:30 PM Medical Record Number: [id number] Patient Account Number: [id number] Date of Birth/Gender: Treating RN: 01-Feb-1930 (88 y.o. [name], [place] Primary Care Physician: [name] Other Clinician: Referring  Physician: Treating Physician/Extender: [name] in Treatment: 4 Education Assessment Education Provided To: Patient Education Topics Provided Pressure: Methods: Explain/Verbal Responses: Reinforcements needed, State content correctly Safety: Methods: Explain/Verbal Responses: Reinforcements needed, State content correctly Wound/Skin Impairment: Methods: Explain/Verbal Responses: Reinforcements needed, State content correctly Electronic Signature(s) Signed: 02/04/2022 5:08:54 PM By: Jerry Hammock RN Entered By: [name] on 02/04/2022 13:45:08 -------------------------------------------------------------------------------- Wound Assessment Details Patient Name: Date of Service: [name] 02/04/2022 12:30 PM Medical Record Number: [id number] Patient Account Number: [id number] Date of Birth/Sex: Treating RN: Apr 05, 1930 (88 y.o. [name], [name] Primary Care Leviathan Macera: [name] Other Clinician: Referring Cheick Suhr: Treating Latarsha Zani/Extender: [name] in Treatment: 4 Wound Status Wound Number: 1 Primary Etiology: Pressure Ulcer Wound Location: Right Calcaneus Wound Status: Open Wounding Event: Pressure Injury Comorbid History: Cataracts, Arrhythmia Date Acquired: 12/02/2021 Weeks Of Treatment: 4 Clustered Wound: No Photos Wound Measurements Length: (cm) 2.5 Width: (cm) 1.4 Depth: (cm) 0.3 Area: (cm) 2.749 Volume: (cm) 0.825 % Reduction in Area: 43.5% % Reduction in Volume: 43.5% Epithelialization: Small (1-33%) Tunneling: No Undermining: No Wound Description Classification: Unstageable/Unclassified Wound Margin: Distinct, outline attached Exudate Amount: Medium Exudate Type: Serosanguineous Exudate Color: red, brown Foul Odor After Cleansing: No Slough/Fibrino Yes Wound Bed Granulation Amount: Large (67-100%) Exposed Structure Granulation Quality: Red, Pink Fascia Exposed:  No Necrotic Amount: Small (1-33%) Fat Layer (Subcutaneous Tissue) Exposed: Yes Necrotic Quality: Adherent Slough Tendon Exposed: No Muscle Exposed: No Joint Exposed: No Bone Exposed: No Treatment Notes Wound #1 (Calcaneus) Wound Laterality: Right Cleanser Wound Cleanser Discharge Instruction:  Cleanse the wound with wound cleanser prior to applying a clean dressing using gauze sponges, not tissue or cotton balls. Peri-Wound Care Zinc Oxide Ointment 30g tube Discharge Instruction: Apply Zinc Oxide to periwound with each dressing change Topical Primary Dressing Hydrofera Blue Classic Foam, 4x4 in Discharge Instruction: Moisten with saline prior to applying to wound bed Secondary Dressing Woven Gauze Sponge, Non-Sterile 4x4 in Discharge Instruction: Apply over primary dressing as directed. ALLEVYN Heel 4 1/2in x 5 1/2in / 10.5cm x 13.5cm Discharge Instruction: Apply over primary dressing as directed. Secured With [employer], 4.5x3.1 (in/yd) Discharge Instruction: Secure with Kerlix as directed. 64M Medipore H Soft Cloth Surgical T ape, 4 x 10 (in/yd) Discharge Instruction: Secure with tape as directed. Compression Wrap Compression Stockings Add-Ons Electronic Signature(s) Signed: 02/04/2022 5:08:54 PM By: Jerry Hammock RN Signed: 02/04/2022 5:08:54 PM By: Jerry Hammock RN Entered By: [name] on 02/04/2022 13:00:16 -------------------------------------------------------------------------------- Wound Assessment Details Patient Name: Date of Service: [name] 02/04/2022 12:30 PM Medical Record Number: [id number] Patient Account Number: [id number] Date of Birth/Sex: Treating RN: 1930-05-20 (88 y.o. [name], [name] Primary Care Kaylan Yates: [name] Other Clinician: Referring Maria Coin: Treating Bryer Cozzolino/Extender: [name] in Treatment: 4 Wound Status Wound Number: 2 Primary Etiology: Pressure Ulcer Wound  Location: Sacrum Wound Status: Open Wounding Event: Pressure Injury Comorbid History: Cataracts, Arrhythmia Date Acquired: 12/02/2021 Weeks Of Treatment: 4 Clustered Wound: No Photos Wound Measurements Length: (cm) 5.3 Width: (cm) 6 Depth: (cm) 2.4 Area: (cm) 24.976 Volume: (cm) 59.942 % Reduction in Area: 51.2% % Reduction in Volume: 46.7% Epithelialization: Small (1-33%) Tunneling: No Undermining: Yes Starting Position (o'clock): 3 Ending Position (o'clock): 9 Maximum Distance: (cm) 3.8 Wound Description Classification: Category/Stage IV Wound Margin: Well defined, not attached Exudate Amount: Medium Exudate Type: Serosanguineous Exudate Color: red, brown Foul Odor After Cleansing: No Slough/Fibrino No Wound Bed Granulation Amount: Large (67-100%) Exposed Structure Granulation Quality: Red Fascia Exposed: No Necrotic Amount: None Present (0%) Fat Layer (Subcutaneous Tissue) Exposed: Yes Tendon Exposed: Yes Muscle Exposed: No Joint Exposed: No Bone Exposed: No Treatment Notes Wound #2 (Sacrum) Cleanser Wound Cleanser Discharge Instruction: Cleanse the wound with wound cleanser prior to applying a clean dressing using gauze sponges, not tissue or cotton balls. Peri-Wound Care Zinc Oxide Ointment 30g tube Discharge Instruction: Apply Zinc Oxide to periwound with each dressing change Topical Primary Dressing Dakin's Solution 0.125%, 16 (oz) Discharge Instruction: Moisten gauze with Dakin's solution Secondary Dressing MPM [name] SAP Bordered Dressing, 7x6.7 (Sacral) (in/in) Discharge Instruction: Apply silicone border over primary dressing as directed. Secured With Compression Wrap Compression Stockings Environmental education officer) Signed: 02/04/2022 5:08:54 PM By: Jerry Hammock RN Entered By: [name] on 02/04/2022 13:09:07 -------------------------------------------------------------------------------- Vitals Details Patient Name: Date  of Service: [name], [name]. 02/04/2022 12:30 PM Medical Record Number: [id number] Patient Account Number: [id number] Date of Birth/Sex: Treating RN: 28-Jun-1930 (88 y.o. [name], [name] Primary Care Inga Noller: [name] Other Clinician: Referring Donya Hitch: Treating Joleigh Mineau/Extender: [name] in Treatment: 4 Vital Signs Time Taken: 13:00 Temperature (F): 98.7 Height (in): 73 Pulse (bpm): 64 Weight (lbs): 145 Respiratory Rate (breaths/min): 17 Body Mass Index (BMI): 19.1 Blood Pressure (mmHg): 122/73 Reference Range: 80 - 120 mg / dl Electronic Signature(s) Signed: 02/04/2022 5:08:54 PM By: Jerry Hammock RN Entered By: [name] on 02/04/2022 13:00:42

## 2022-02-18 ENCOUNTER — Other Ambulatory Visit: Payer: Self-pay

## 2022-02-18 ENCOUNTER — Encounter (HOSPITAL_BASED_OUTPATIENT_CLINIC_OR_DEPARTMENT_OTHER): Payer: Medicare Other | Attending: Internal Medicine | Admitting: Internal Medicine

## 2022-02-18 DIAGNOSIS — K900 Celiac disease: Secondary | ICD-10-CM | POA: Insufficient documentation

## 2022-02-18 DIAGNOSIS — Z8546 Personal history of malignant neoplasm of prostate: Secondary | ICD-10-CM | POA: Diagnosis not present

## 2022-02-18 DIAGNOSIS — N182 Chronic kidney disease, stage 2 (mild): Secondary | ICD-10-CM | POA: Diagnosis not present

## 2022-02-18 DIAGNOSIS — Z7901 Long term (current) use of anticoagulants: Secondary | ICD-10-CM | POA: Insufficient documentation

## 2022-02-18 DIAGNOSIS — E039 Hypothyroidism, unspecified: Secondary | ICD-10-CM | POA: Insufficient documentation

## 2022-02-18 DIAGNOSIS — L89154 Pressure ulcer of sacral region, stage 4: Secondary | ICD-10-CM | POA: Diagnosis not present

## 2022-02-18 DIAGNOSIS — L89610 Pressure ulcer of right heel, unstageable: Secondary | ICD-10-CM | POA: Diagnosis not present

## 2022-02-18 DIAGNOSIS — I4891 Unspecified atrial fibrillation: Secondary | ICD-10-CM | POA: Insufficient documentation

## 2022-02-18 NOTE — Progress Notes (Signed)
[name], [name] ([id number]) Visit Report for 02/18/2022 Arrival Information Details Patient Name: Date of Service: [name], [name] 02/18/2022 12:30 PM Medical Record Number: [id number] Patient Account Number: [id number] Date of Birth/Sex: Treating RN: February 07, 1930 (88 y.o. [name] Primary Care Fujie Dickison: [name] Other Clinician: Referring Alonte Wulff: Treating Mickey Esguerra/Extender: [name] in Treatment: 6 Visit Information History Since Last Visit Added or deleted any medications: No Patient Arrived: Wheel Chair Any new allergies or adverse reactions: No Arrival Time: 12:49 Had a fall or experienced change in No Accompanied By: son activities of daily living that may affect Transfer Assistance: Manual risk of falls: Patient Identification Verified: Yes Signs or symptoms of abuse/neglect since last visito No Secondary Verification Process Completed: Yes Hospitalized since last visit: No Patient Requires Transmission-Based Precautions: No Implantable device outside of the clinic excluding No Patient Has Alerts: Yes cellular tissue based products placed in the center Patient Alerts: Patient on Blood Thinner since last visit: R ABI non compressible Has Dressing in Place as Prescribed: Yes Has Footwear/Offloading in Place as Prescribed: Yes Left: Other:Prevalon Boots Right: Other:Prevalon Boots Pain Present Now: No Electronic Signature(s) Signed: 02/18/2022 5:00:00 PM By: [name] Entered By: [name] on 02/18/2022 12:56:01 -------------------------------------------------------------------------------- Clinic Level of Care Assessment Details Patient Name: Date of Service: [name], [name] 02/18/2022 12:30 PM Medical Record Number: [id number] Patient Account Number: [id number] Date of Birth/Sex: Treating RN: 07-Feb-1930 (88 y.o. [name] Primary Care Daley Gosse: [name] Other Clinician: Referring Jalyssa Fleisher: Treating  Legacy Lacivita/Extender: [name] in Treatment: 6 Clinic Level of Care Assessment Items TOOL 4 Quantity Score X- 1 0 Use when only an EandM is performed on FOLLOW-UP visit ASSESSMENTS - Nursing Assessment / Reassessment X- 1 10 Reassessment of Co-morbidities (includes updates in patient status) X- 1 5 Reassessment of Adherence to Treatment Plan ASSESSMENTS - Wound and Skin A ssessment / Reassessment []  - 0 Simple Wound Assessment / Reassessment - one wound X- 2 5 Complex Wound Assessment / Reassessment - multiple wounds []  - 0 Dermatologic / Skin Assessment (not related to wound area) ASSESSMENTS - Focused Assessment []  - 0 Circumferential Edema Measurements - multi extremities []  - 0 Nutritional Assessment / Counseling / Intervention []  - 0 Lower Extremity Assessment (monofilament, tuning fork, pulses) []  - 0 Peripheral Arterial Disease Assessment (using hand held doppler) ASSESSMENTS - Ostomy and/or Continence Assessment and Care []  - 0 Incontinence Assessment and Management []  - 0 Ostomy Care Assessment and Management (repouching, etc.) PROCESS - Coordination of Care []  - 0 Simple Patient / Family Education for ongoing care X- 1 20 Complex (extensive) Patient / Family Education for ongoing care []  - 0 Staff obtains Programmer, systems, Records, T Results / Process Orders est X- 1 10 Staff telephones HHA, Nursing Homes / Clarify orders / etc []  - 0 Routine Transfer to another Facility (non-emergent condition) []  - 0 Routine Hospital Admission (non-emergent condition) []  - 0 New Admissions / Biomedical engineer / Ordering NPWT Apligraf, etc. , []  - 0 Emergency Hospital Admission (emergent condition) []  - 0 Simple Discharge Coordination []  - 0 Complex (extensive) Discharge Coordination PROCESS - Special Needs []  - 0 Pediatric / Minor Patient Management []  - 0 Isolation Patient Management []  - 0 Hearing / Language / Visual special needs []   - 0 Assessment of Community assistance (transportation, D/C planning, etc.) []  - 0 Additional assistance / Altered mentation []  - 0 Support Surface(s) Assessment (bed, cushion, seat, etc.) INTERVENTIONS - Wound Cleansing /  Measurement []  - 0 Simple Wound Cleansing - one wound X- 2 5 Complex Wound Cleansing - multiple wounds X- 1 5 Wound Imaging (photographs - any number of wounds) []  - 0 Wound Tracing (instead of photographs) []  - 0 Simple Wound Measurement - one wound X- 2 5 Complex Wound Measurement - multiple wounds INTERVENTIONS - Wound Dressings X - Small Wound Dressing one or multiple wounds 1 10 X- 1 15 Medium Wound Dressing one or multiple wounds []  - 0 Large Wound Dressing one or multiple wounds []  - 0 Application of Medications - topical []  - 0 Application of Medications - injection INTERVENTIONS - Miscellaneous []  - 0 External ear exam []  - 0 Specimen Collection (cultures, biopsies, blood, body fluids, etc.) []  - 0 Specimen(s) / Culture(s) sent or taken to Lab for analysis []  - 0 Patient Transfer (multiple staff / Civil Service fast streamer / Similar devices) []  - 0 Simple Staple / Suture removal (25 or less) []  - 0 Complex Staple / Suture removal (26 or more) []  - 0 Hypo / Hyperglycemic Management (close monitor of Blood Glucose) []  - 0 Ankle / Brachial Index (ABI) - do not check if billed separately X- 1 5 Vital Signs Has the patient been seen at the hospital within the last three years: Yes Total Score: 110 Level Of Care: New/Established - Level 3 Electronic Signature(s) Signed: 02/18/2022 5:00:00 PM By: [name] Entered By: [name] on 02/18/2022 13:21:10 -------------------------------------------------------------------------------- Encounter Discharge Information Details Patient Name: Date of Service: [name] 02/18/2022 12:30 PM Medical Record Number: [id number] Patient Account Number: [id number] Date of Birth/Sex: Treating RN: 1930/09/26  (88 y.o. [name] Primary Care Lochlin Eppinger: [name] Other Clinician: Referring Mihcael Ledee: Treating Iori Gigante/Extender: [name] in Treatment: 6 Encounter Discharge Information Items Discharge Condition: Stable Ambulatory Status: Wheelchair Discharge Destination: Skilled Nursing Facility Orders Sent: Yes Transportation: Other Accompanied By: son Schedule Follow-up Appointment: Yes Clinical Summary of Care: Provided on 02/18/2022 Form Type Recipient Paper Patient Patient Electronic Signature(s) Signed: 02/18/2022 5:00:00 PM By: [name] Entered By: [name] on 02/18/2022 13:47:46 -------------------------------------------------------------------------------- Lower Extremity Assessment Details Patient Name: Date of Service: [name], [name] 02/18/2022 12:30 PM Medical Record Number: [id number] Patient Account Number: [id number] Date of Birth/Sex: Treating RN: 03-13-1930 (88 y.o. [name] Primary Care Mirinda Monte: [name] Other Clinician: Referring Daija Routson: Treating Calyse Murcia/Extender: [name] in Treatment: 6 Edema Assessment Assessed: Shirlyn Goltz: No] Patrice Paradise: Yes] Edema: [Left: N] [Right: o] Calf Left: Right: Point of Measurement: 32 cm From Medial Instep 30 cm Ankle Left: Right: Point of Measurement: 12 cm From Medial Instep 20.5 cm Vascular Assessment Pulses: Dorsalis Pedis Palpable: [Right:Yes] Electronic Signature(s) Signed: 02/18/2022 5:00:00 PM By: [name] Entered By: [name] on 02/18/2022 13:01:53 -------------------------------------------------------------------------------- Multi Wound Chart Details Patient Name: Date of Service: [name] 02/18/2022 12:30 PM Medical Record Number: [id number] Patient Account Number: [id number] Date of Birth/Sex: Treating RN: 1930/11/22 (88 y.o. M) Primary Care Peony Barner: [name] Other Clinician: Referring  Honorio Devol: Treating Nyal Schachter/Extender: [name] in Treatment: 6 Vital Signs Height(in): 73 Pulse(bpm): 72 Weight(lbs): 145 Blood Pressure(mmHg): 104/66 Body Mass Index(BMI): 19.1 Temperature(F): 97.9 Respiratory Rate(breaths/min): 18 Photos: [N/A:N/A] Right Calcaneus Sacrum N/A Wound Location: Pressure Injury Pressure Injury N/A Wounding Event: Pressure Ulcer Pressure Ulcer N/A Primary Etiology: Cataracts, Arrhythmia Cataracts, Arrhythmia N/A Comorbid History: 12/02/2021 12/02/2021 N/A Date Acquired: 6 6 N/A Weeks of Treatment: Open Open N/A Wound Status: No No N/A Wound Recurrence: 1.6x0.8x0.2  5x4.7x1.6 N/A Measurements L x W x D (cm) 1.005 18.457 N/A A (cm) : rea 0.201 29.531 N/A Volume (cm) : 79.40% 63.90% N/A % Reduction in A rea: 86.20% 73.70% N/A % Reduction in Volume: 3 Starting Position 1 (o'clock): 8 Ending Position 1 (o'clock): 3 Maximum Distance 1 (cm): No Yes N/A Undermining: Unstageable/Unclassified Category/Stage IV N/A Classification: Medium Medium N/A Exudate A mount: Serosanguineous Serosanguineous N/A Exudate Type: red, brown red, brown N/A Exudate Color: Distinct, outline attached Well defined, not attached N/A Wound Margin: Large (67-100%) Large (67-100%) N/A Granulation A mount: Red, Pink Red N/A Granulation Quality: Small (1-33%) None Present (0%) N/A Necrotic A mount: Fat Layer (Subcutaneous Tissue): Yes Fat Layer (Subcutaneous Tissue): Yes N/A Exposed Structures: Fascia: No Tendon: Yes Tendon: No Fascia: No Muscle: No Muscle: No Joint: No Joint: No Bone: No Bone: No Medium (34-66%) Small (1-33%) N/A Epithelialization: Treatment Notes Electronic Signature(s) Signed: 02/18/2022 1:41:40 PM By: [name] DO Entered By: [name] on 02/18/2022 13:35:50 -------------------------------------------------------------------------------- Multi-Disciplinary Care Plan  Details Patient Name: Date of Service: [name] 02/18/2022 12:30 PM Medical Record Number: [id number] Patient Account Number: [id number] Date of Birth/Sex: Treating RN: 21-Jun-1930 (88 y.o. [name] Primary Care Staley Lunz: [name] Other Clinician: Referring Daiel Strohecker: Treating Annora Guderian/Extender: [name] in Treatment: 6 Multidisciplinary Care Plan reviewed with physician Active Inactive Abuse / Safety / Falls / Self Care Management Nursing Diagnoses: History of Falls Potential for injury related to falls Goals: Patient will not experience any injury related to falls Date Initiated: 01/07/2022 Target Resolution Date: 03/11/2022 Goal Status: Active Patient/caregiver will verbalize/demonstrate measures taken to prevent injury and/or falls Date Initiated: 01/07/2022 Target Resolution Date: 03/11/2022 Goal Status: Active Interventions: Assess Activities of Daily Living upon admission and as needed Assess fall risk on admission and as needed Assess: immobility, friction, shearing, incontinence upon admission and as needed Assess impairment of mobility on admission and as needed per policy Assess personal safety and home safety (as indicated) on admission and as needed Assess self care needs on admission and as needed Provide education on personal and home safety Notes: Pressure Nursing Diagnoses: Knowledge deficit related to causes and risk factors for pressure ulcer development Knowledge deficit related to management of pressures ulcers Potential for impaired tissue integrity related to pressure, friction, moisture, and shear Goals: Patient/caregiver will verbalize risk factors for pressure ulcer development Date Initiated: 01/07/2022 Target Resolution Date: 03/11/2022 Goal Status: Active Patient/caregiver will verbalize understanding of pressure ulcer management Date Initiated: 01/07/2022 Target Resolution Date: 03/11/2022 Goal  Status: Active Interventions: Assess: immobility, friction, shearing, incontinence upon admission and as needed Assess offloading mechanisms upon admission and as needed Assess potential for pressure ulcer upon admission and as needed Provide education on pressure ulcers Notes: Wound/Skin Impairment Nursing Diagnoses: Impaired tissue integrity Knowledge deficit related to ulceration/compromised skin integrity Goals: Patient/caregiver will verbalize understanding of skin care regimen Date Initiated: 01/07/2022 Target Resolution Date: 03/11/2022 Goal Status: Active Ulcer/skin breakdown will have a volume reduction of 30% by week 4 Date Initiated: 01/07/2022 Target Resolution Date: 03/11/2022 Goal Status: Active Interventions: Assess patient/caregiver ability to obtain necessary supplies Assess patient/caregiver ability to perform ulcer/skin care regimen upon admission and as needed Assess ulceration(s) every visit Provide education on ulcer and skin care Notes: Electronic Signature(s) Signed: 02/18/2022 5:00:00 PM By: [name] Entered By: [name] on 02/18/2022 13:09:19 -------------------------------------------------------------------------------- Pain Assessment Details Patient Name: Date of Service: [name] 02/18/2022 12:30 PM Medical Record Number: [id number] Patient Account Number:  [id number] Date of Birth/Sex: Treating RN: 02-15-1930 (88 y.o. [name] Primary Care Elmin Wiederholt: [name] Other Clinician: Referring Zaiyah Sottile: Treating Claudie Brickhouse/Extender: [name] in Treatment: 6 Active Problems Location of Pain Severity and Description of Pain Patient Has Paino No Site Locations Pain Management and Medication Current Pain Management: Electronic Signature(s) Signed: 02/18/2022 5:00:00 PM By: [name] Entered By: [name] on 02/18/2022  12:57:53 -------------------------------------------------------------------------------- Patient/Caregiver Education Details Patient Name: Date of Service: [name] 3/2/2023andnbsp12:30 PM Medical Record Number: [id number] Patient Account Number: [id number] Date of Birth/Gender: Treating RN: 01/23/1930 (88 y.o. [name] Primary Care Physician: [name] Other Clinician: Referring Physician: Treating Physician/Extender: [name] in Treatment: 6 Education Assessment Education Provided To: Patient and Caregiver Education Topics Provided Pressure: Methods: Explain/Verbal, Printed Responses: State content correctly Wound/Skin Impairment: Methods: Explain/Verbal, Printed Responses: State content correctly Electronic Signature(s) Signed: 02/18/2022 5:00:00 PM By: [name] Entered By: [name] on 02/18/2022 13:09:59 -------------------------------------------------------------------------------- Wound Assessment Details Patient Name: Date of Service: [name] 02/18/2022 12:30 PM Medical Record Number: [id number] Patient Account Number: [id number] Date of Birth/Sex: Treating RN: May 16, 1930 (88 y.o. [name] Primary Care Mikeria Valin: [name] Other Clinician: Referring Julian Medina: Treating Lara Palinkas/Extender: [name] in Treatment: 6 Wound Status Wound Number: 1 Primary Etiology: Pressure Ulcer Wound Location: Right Calcaneus Wound Status: Open Wounding Event: Pressure Injury Comorbid History: Cataracts, Arrhythmia Date Acquired: 12/02/2021 Weeks Of Treatment: 6 Clustered Wound: No Photos Wound Measurements Length: (cm) 1.6 Width: (cm) 0.8 Depth: (cm) 0.2 Area: (cm) 1.005 Volume: (cm) 0.201 % Reduction in Area: 79.4% % Reduction in Volume: 86.2% Epithelialization: Medium (34-66%) Tunneling: No Undermining: No Wound Description Classification:  Unstageable/Unclassified Wound Margin: Distinct, outline attached Exudate Amount: Medium Exudate Type: Serosanguineous Exudate Color: red, brown Foul Odor After Cleansing: No Slough/Fibrino Yes Wound Bed Granulation Amount: Large (67-100%) Exposed Structure Granulation Quality: Red, Pink Fascia Exposed: No Necrotic Amount: Small (1-33%) Fat Layer (Subcutaneous Tissue) Exposed: Yes Necrotic Quality: Adherent Slough Tendon Exposed: No Muscle Exposed: No Joint Exposed: No Bone Exposed: No Treatment Notes Wound #1 (Calcaneus) Wound Laterality: Right Cleanser Wound Cleanser Discharge Instruction: Cleanse the wound with wound cleanser prior to applying a clean dressing using gauze sponges, not tissue or cotton balls. Peri-Wound Care Zinc Oxide Ointment 30g tube Discharge Instruction: Apply Zinc Oxide to periwound with each dressing change Topical Primary Dressing Hydrofera Blue Classic Foam, 4x4 in Discharge Instruction: Moisten with saline prior to applying to wound bed Secondary Dressing Woven Gauze Sponge, Non-Sterile 4x4 in Discharge Instruction: Apply over primary dressing as directed. ALLEVYN Heel 4 1/2in x 5 1/2in / 10.5cm x 13.5cm Discharge Instruction: Apply over primary dressing as directed. Secured With [employer], 4.5x3.1 (in/yd) Discharge Instruction: Secure with Kerlix as directed. 40M Medipore H Soft Cloth Surgical T ape, 4 x 10 (in/yd) Discharge Instruction: Secure with tape as directed. Compression Wrap Compression Stockings Add-Ons Electronic Signature(s) Signed: 02/18/2022 5:00:00 PM By: [name] Signed: 02/18/2022 5:00:00 PM By: [name] Entered By: [name] on 02/18/2022 13:07:38 -------------------------------------------------------------------------------- Wound Assessment Details Patient Name: Date of Service: [name] 02/18/2022 12:30 PM Medical Record Number: [id number] Patient Account Number: [id number] Date of  Birth/Sex: Treating RN: [birth year] (88 y.o. [name] Primary Care Marisol Giambra: [name] Other Clinician: Referring Gladyes Kudo: Treating Treyden Hakim/Extender: [name] in Treatment: 6 Wound Status Wound Number: 2 Primary Etiology: Pressure Ulcer Wound Location: Sacrum Wound Status: Open Wounding Event: Pressure Injury  Comorbid History: Cataracts, Arrhythmia Date Acquired: 12/02/2021 Weeks Of Treatment: 6 Clustered Wound: No Photos Wound Measurements Length: (cm) 5 Width: (cm) 4.7 Depth: (cm) 1.6 Area: (cm) 18.457 Volume: (cm) 29.531 % Reduction in Area: 63.9% % Reduction in Volume: 73.7% Epithelialization: Small (1-33%) Tunneling: No Undermining: Yes Starting Position (o'clock): 3 Ending Position (o'clock): 8 Maximum Distance: (cm) 3 Wound Description Classification: Category/Stage IV Wound Margin: Well defined, not attached Exudate Amount: Medium Exudate Type: Serosanguineous Exudate Color: red, brown Foul Odor After Cleansing: No Slough/Fibrino No Wound Bed Granulation Amount: Large (67-100%) Exposed Structure Granulation Quality: Red Fascia Exposed: No Necrotic Amount: None Present (0%) Fat Layer (Subcutaneous Tissue) Exposed: Yes Tendon Exposed: Yes Muscle Exposed: No Joint Exposed: No Bone Exposed: No Treatment Notes Wound #2 (Sacrum) Cleanser Wound Cleanser Discharge Instruction: Cleanse the wound with wound cleanser prior to applying a clean dressing using gauze sponges, not tissue or cotton balls. Peri-Wound Care Zinc Oxide Ointment 30g tube Discharge Instruction: Apply Zinc Oxide to periwound with each dressing change Topical Primary Dressing Dakin's Solution 0.125%, 16 (oz) Discharge Instruction: Moisten gauze with Dakin's solution Secondary Dressing MPM [name] SAP Bordered Dressing, 7x6.7 (Sacral) (in/in) Discharge Instruction: Apply silicone border over primary dressing as directed. Secured  With Compression Wrap Compression Stockings Environmental education officer) Signed: 02/18/2022 5:00:00 PM By: [name] Entered By: [name] on 02/18/2022 13:06:49 -------------------------------------------------------------------------------- Vitals Details Patient Name: Date of Service: [name], [name] 02/18/2022 12:30 PM Medical Record Number: [id number] Patient Account Number: [id number] Date of Birth/Sex: Treating RN: Feb 18, 1930 (88 y.o. [name] Primary Care Nou Chard: [name] Other Clinician: Referring Kirstine Jacquin: Treating Anjeanette Petzold/Extender: [name] in Treatment: 6 Vital Signs Time Taken: 12:57 Temperature (F): 97.9 Height (in): 73 Pulse (bpm): 72 Weight (lbs): 145 Respiratory Rate (breaths/min): 18 Body Mass Index (BMI): 19.1 Blood Pressure (mmHg): 104/66 Reference Range: 80 - 120 mg / dl Electronic Signature(s) Signed: 02/18/2022 5:00:00 PM By: [name] Entered By: [name] on 02/18/2022 12:57:47

## 2022-02-18 NOTE — Progress Notes (Signed)
[name] ([id number]) Visit Report for 02/18/2022 Chief Complaint Document Details Patient Name: Date of Service: [name] 02/18/2022 12:30 PM Medical Record Number: [id number] Patient Account Number: [id number] Date of Birth/Sex: Treating RN: [birth year] (88 y.o. M) Primary Care Provider: [name] Other Clinician: Referring Provider: Treating Provider/Extender: [name] in Treatment: 6 Information Obtained from: Patient Chief Complaint Sacral and left heel pressure ulcers Electronic Signature(s) Signed: 02/18/2022 1:41:40 PM By: [name] DO Entered By: [name] on 02/18/2022 13:35:57 -------------------------------------------------------------------------------- HPI Details Patient Name: Date of Service: [name] 02/18/2022 12:30 PM Medical Record Number: [id number] Patient Account Number: [id number] Date of Birth/Sex: Treating RN: 05/17/1930 (88 y.o. M) Primary Care Provider: [name] Other Clinician: Referring Provider: Treating Provider/Extender: [name] in Treatment: 6 History of Present Illness HPI Description: Admission 01/07/2022 [name] is an 88 year old male with a past medical history of prostate cancer, hypothyroidism and celiac disease that presents to the clinic for a 1 month history of ulcers to his sacrum and right heel. He was admitted to the hospital on 12/05/2021 for rhabdomyolysis secondary to being on the floor for 2 days without being able to get up. He developed a pressure ulcer to his sacrum and right heel. In the hospital they were doing hydrotherapy. He was discharged on 12/27 to a skilled nursing facility. He has been using wet-to-dry dressings with Santyl to the sacrum and Betadine to the right heel wound. He currently denies systemic signs of infection. He has Prevalon boots and a group 2 air mattress. He has a hard time offloading the sacrum and  is on his back most of the day. 2/2; patient presents for follow-up. Santyl has been used to the right heel wound and Dakin's wet-to-dry dressings to the sacral wound. He states he had imaging done to both wound bed areas. He has no issues or complaints today. He denies signs of infection. 2/16; patient presents for follow-up. He is using Dakin's to the sacral wound and Santyl to the left heel wound. He has no issues or complaints today. He denies signs of infection. Unfortunately he is unable to offload the sacral wound bed. He either sits in the bed all day or he is in a wheelchair. 3/2; patient presents for follow-up. He has been using Dakin's wet-to-dry dressing to the sacral wound and Hydrofera Blue to the left heel wound. He has no issues or complaints today. He denies signs of infection. Electronic Signature(s) Signed: 02/18/2022 1:41:40 PM By: [name] DO Entered By: [name] on 02/18/2022 13:36:21 -------------------------------------------------------------------------------- Physical Exam Details Patient Name: Date of Service: [name] 02/18/2022 12:30 PM Medical Record Number: [id number] Patient Account Number: [id number] Date of Birth/Sex: Treating RN: [birth year] (88 y.o. M) Primary Care Provider: [name] Other Clinician: Referring Provider: Treating Provider/Extender: [name] in Treatment: 6 Constitutional respirations regular, non-labored and within target range for patient.. Cardiovascular 2+ dorsalis pedis/posterior tibialis pulses. Psychiatric pleasant and cooperative. Notes Right lower extremity: T the heel there is an open wound with granulation tissue throughout. T the sacrum there is an open wound with granulation tissue and o o undermining. No signs of surrounding infection to either wound beds. Electronic Signature(s) Signed: 02/18/2022 1:41:40 PM By: [name] DO Entered By: [name] on  02/18/2022 13:39:26 -------------------------------------------------------------------------------- Physician Orders Details Patient Name: Date of Service: [name] 02/18/2022 12:30 PM Medical Record Number: [id number] Patient Account Number: [id number] Date  of Birth/Sex: Treating RN: [birth year] (88 y.o. [name] Primary Care Provider: [name] Other Clinician: Referring Provider: Treating Provider/Extender: [name] in Treatment: 6 Verbal / Phone Orders: No Diagnosis Coding Follow-up Appointments ppointment in 2 weeks. - Dr Heber [place] [facility], Room7) Return A Other: - ** Please fax Xray results to [phone]** Bathing/ Shower/ Hygiene Other Bathing/Shower/Hygiene Orders/Instructions: - May have a sponge bath or shower Negative Presssure Wound Therapy Other: - Will consider wound vac at next visit if receive Xray results Edema Control - Lymphedema / SCD / Other Right Lower Extremity Elevate legs to the level of the heart or above for 30 minutes daily and/or when sitting, a frequency of: - Throughout the day Moisturize legs daily. Off-Loading Heel suspension boot to: - Prevalon Boots to bilateral feet Low air-loss mattress (Group 2) Gel wheelchair cushion Turn and reposition every 2 hours Additional Orders / Instructions Follow Nutritious Diet - High protein for wound healing Wound Treatment Wound #1 - Calcaneus Wound Laterality: Right Cleanser: Wound Cleanser 1 x Per Day/15 Days Discharge Instructions: Cleanse the wound with wound cleanser prior to applying a clean dressing using gauze sponges, not tissue or cotton balls. Peri-Wound Care: Zinc Oxide Ointment 30g tube 1 x Per Day/15 Days Discharge Instructions: Apply Zinc Oxide to periwound with each dressing change Prim Dressing: Hydrofera Blue Classic Foam, 4x4 in 1 x Per Day/15 Days ary Discharge Instructions: Moisten with saline prior to applying to wound bed Secondary  Dressing: Woven Gauze Sponge, Non-Sterile 4x4 in 1 x Per Day/15 Days Discharge Instructions: Apply over primary dressing as directed. Secondary Dressing: ALLEVYN Heel 4 1/2in x 5 1/2in / 10.5cm x 13.5cm 1 x Per Day/15 Days Discharge Instructions: Apply over primary dressing as directed. Secured With: [employer], 4.5x3.1 (in/yd) 1 x Per Day/15 Days Discharge Instructions: Secure with Kerlix as directed. Secured With: 60M Medipore H Soft Cloth Surgical T ape, 4 x 10 (in/yd) 1 x Per Day/15 Days Discharge Instructions: Secure with tape as directed. Wound #2 - Sacrum Cleanser: Wound Cleanser 2 x Per URK/27 Days Discharge Instructions: Cleanse the wound with wound cleanser prior to applying a clean dressing using gauze sponges, not tissue or cotton balls. Peri-Wound Care: Zinc Oxide Ointment 30g tube 2 x Per Day/15 Days Discharge Instructions: Apply Zinc Oxide to periwound with each dressing change Prim Dressing: Dakin's Solution 0.125%, 16 (oz) 2 x Per Day/15 Days ary Discharge Instructions: Moisten gauze with Dakin's solution Secondary Dressing: MPM [name] SAP Bordered Dressing, 7x6.7 (Sacral) (in/in) 2 x Per Day/15 Days Discharge Instructions: Apply silicone border over primary dressing as directed. Electronic Signature(s) Signed: 02/18/2022 1:41:40 PM By: [name] DO Entered By: [name] on 02/18/2022 13:39:43 -------------------------------------------------------------------------------- Problem List Details Patient Name: Date of Service: [name] 02/18/2022 12:30 PM Medical Record Number: [id number] Patient Account Number: [id number] Date of Birth/Sex: Treating RN: 1930-06-15 (88 y.o. M) Primary Care Provider: [name] Other Clinician: Referring Provider: Treating Provider/Extender: [name] in Treatment: 6 Active Problems ICD-10 Encounter Code Description Active Date MDM Diagnosis L89.154 Pressure ulcer of sacral  region, stage 4 01/07/2022 No Yes L89.610 Pressure ulcer of right heel, unstageable 01/07/2022 No Yes E03.9 Hypothyroidism, unspecified 01/07/2022 No Yes Inactive Problems Resolved Problems Electronic Signature(s) Signed: 02/18/2022 1:41:40 PM By: [name] DO Entered By: [name] on 02/18/2022 13:35:44 -------------------------------------------------------------------------------- Progress Note Details Patient Name: Date of Service: [name] 02/18/2022 12:30 PM Medical Record Number: [id number] Patient Account Number: [id number] Date  of Birth/Sex: Treating RN: 09/19/1930 (88 y.o. M) Primary Care Provider: [name] Other Clinician: Referring Provider: Treating Provider/Extender: [name] in Treatment: 6 Subjective Chief Complaint Information obtained from Patient Sacral and left heel pressure ulcers History of Present Illness (HPI) Admission 01/07/2022 [name] is an 88 year old male with a past medical history of prostate cancer, hypothyroidism and celiac disease that presents to the clinic for a 1 month history of ulcers to his sacrum and right heel. He was admitted to the hospital on 12/05/2021 for rhabdomyolysis secondary to being on the floor for 2 days without being able to get up. He developed a pressure ulcer to his sacrum and right heel. In the hospital they were doing hydrotherapy. He was discharged on 12/27 to a skilled nursing facility. He has been using wet-to-dry dressings with Santyl to the sacrum and Betadine to the right heel wound. He currently denies systemic signs of infection. He has Prevalon boots and a group 2 air mattress. He has a hard time offloading the sacrum and is on his back most of the day. 2/2; patient presents for follow-up. Santyl has been used to the right heel wound and Dakin's wet-to-dry dressings to the sacral wound. He states he had imaging done to both wound bed areas. He has no  issues or complaints today. He denies signs of infection. 2/16; patient presents for follow-up. He is using Dakin's to the sacral wound and Santyl to the left heel wound. He has no issues or complaints today. He denies signs of infection. Unfortunately he is unable to offload the sacral wound bed. He either sits in the bed all day or he is in a wheelchair. 3/2; patient presents for follow-up. He has been using Dakin's wet-to-dry dressing to the sacral wound and Hydrofera Blue to the left heel wound. He has no issues or complaints today. He denies signs of infection. Patient History Information obtained from Patient. Family History Unknown History. Social History Never smoker, Marital Status - Widowed, Alcohol Use - Never, Drug Use - No History, Caffeine Use - Never. Medical History Eyes Patient has history of Cataracts - surgery on both eyes Denies history of Glaucoma, Optic Neuritis Cardiovascular Patient has history of Arrhythmia - A.Fib on Eliquis Denies history of Angina, Congestive Heart Failure, Coronary Artery Disease, Deep Vein Thrombosis, Hypertension, Hypotension, Myocardial Infarction, Peripheral Arterial Disease, Peripheral Venous Disease, Phlebitis, Vasculitis Endocrine Denies history of Type I Diabetes, Type II Diabetes Immunological Denies history of Lupus Erythematosus, Raynaudoos, Scleroderma Integumentary (Skin) Denies history of History of Burn Musculoskeletal Denies history of Gout, Rheumatoid Arthritis, Osteoarthritis, Osteomyelitis Neurologic Denies history of Dementia, Neuropathy, Quadriplegia, Paraplegia, Seizure Disorder Oncologic Denies history of Received Chemotherapy, Received Radiation Medical A Surgical History Notes nd Cardiovascular Hx Mitral regurgitation Endocrine Hx Hypothyroidism Genitourinary Recent UTI (had sepsis) CKD stage 2 Musculoskeletal Debility/Deconditioning Neurologic Pt. c/o Nerve damage in 2 fingers on Left hand (after  fall) Oncologic Prostate cancer (had "seeds") Objective Constitutional respirations regular, non-labored and within target range for patient.. Vitals Time Taken: 12:57 PM, Height: 73 in, Weight: 145 lbs, BMI: 19.1, Temperature: 97.9 F, Pulse: 72 bpm, Respiratory Rate: 18 breaths/min, Blood Pressure: 104/66 mmHg. Cardiovascular 2+ dorsalis pedis/posterior tibialis pulses. Psychiatric pleasant and cooperative. General Notes: Right lower extremity: T the heel there is an open wound with granulation tissue throughout. T the sacrum there is an open wound with o o granulation tissue and undermining. No signs of surrounding infection to either wound beds. Integumentary (Hair, Skin) Wound #  1 status is Open. Original cause of wound was Pressure Injury. The date acquired was: 12/02/2021. The wound has been in treatment 6 weeks. The wound is located on the Right Calcaneus. The wound measures 1.6cm length x 0.8cm width x 0.2cm depth; 1.005cm^2 area and 0.201cm^3 volume. There is Fat Layer (Subcutaneous Tissue) exposed. There is no tunneling or undermining noted. There is a medium amount of serosanguineous drainage noted. The wound margin is distinct with the outline attached to the wound base. There is large (67-100%) red, pink granulation within the wound bed. There is a small (1- 33%) amount of necrotic tissue within the wound bed including Adherent Slough. Wound #2 status is Open. Original cause of wound was Pressure Injury. The date acquired was: 12/02/2021. The wound has been in treatment 6 weeks. The wound is located on the Sacrum. The wound measures 5cm length x 4.7cm width x 1.6cm depth; 18.457cm^2 area and 29.531cm^3 volume. There is tendon and Fat Layer (Subcutaneous Tissue) exposed. There is no tunneling noted, however, there is undermining starting at 3:00 and ending at 8:00 with a maximum distance of 3cm. There is a medium amount of serosanguineous drainage noted. The wound margin is well  defined and not attached to the wound base. There is large (67-100%) red granulation within the wound bed. There is no necrotic tissue within the wound bed. Assessment Active Problems ICD-10 Pressure ulcer of sacral region, stage 4 Pressure ulcer of right heel, unstageable Hypothyroidism, unspecified Patient's wounds have shown improvement in size in appearance since last clinic visit. The right heel wound is healing nicely and I recommended continuing Hydrofera Blue here. I recommend Dakin's wet-to-dry to the sacral wound. No signs of surrounding infection. I asked the son to bring the sacral x-ray report as we are having trouble obtaining this from the facility. He would likely benefit from a wound VAC. I will reassess this at next clinic visit. Plan Follow-up Appointments: Return Appointment in 2 weeks. - Dr Heber [place] [facility], Room7) Other: - ** Please fax Xray results to [phone]** Bathing/ Shower/ Hygiene: Other Bathing/Shower/Hygiene Orders/Instructions: - May have a sponge bath or shower Negative Presssure Wound Therapy: Other: - Will consider wound vac at next visit if receive Xray results Edema Control - Lymphedema / SCD / Other: Elevate legs to the level of the heart or above for 30 minutes daily and/or when sitting, a frequency of: - Throughout the day Moisturize legs daily. Off-Loading: Heel suspension boot to: - Prevalon Boots to bilateral feet Low air-loss mattress (Group 2) Gel wheelchair cushion Turn and reposition every 2 hours Additional Orders / Instructions: Follow Nutritious Diet - High protein for wound healing WOUND #1: - Calcaneus Wound Laterality: Right Cleanser: Wound Cleanser 1 x Per Day/15 Days Discharge Instructions: Cleanse the wound with wound cleanser prior to applying a clean dressing using gauze sponges, not tissue or cotton balls. Peri-Wound Care: Zinc Oxide Ointment 30g tube 1 x Per Day/15 Days Discharge Instructions: Apply Zinc Oxide to periwound  with each dressing change Prim Dressing: Hydrofera Blue Classic Foam, 4x4 in 1 x Per Day/15 Days ary Discharge Instructions: Moisten with saline prior to applying to wound bed Secondary Dressing: Woven Gauze Sponge, Non-Sterile 4x4 in 1 x Per Day/15 Days Discharge Instructions: Apply over primary dressing as directed. Secondary Dressing: ALLEVYN Heel 4 1/2in x 5 1/2in / 10.5cm x 13.5cm 1 x Per Day/15 Days Discharge Instructions: Apply over primary dressing as directed. Secured With: [employer], 4.5x3.1 (in/yd) 1 x Per Day/15 Days Discharge  Instructions: Secure with Kerlix as directed. Secured With: 34M Medipore H Soft Cloth Surgical T ape, 4 x 10 (in/yd) 1 x Per Day/15 Days Discharge Instructions: Secure with tape as directed. WOUND #2: - Sacrum Wound Laterality: Cleanser: Wound Cleanser 2 x Per Day/15 Days Discharge Instructions: Cleanse the wound with wound cleanser prior to applying a clean dressing using gauze sponges, not tissue or cotton balls. Peri-Wound Care: Zinc Oxide Ointment 30g tube 2 x Per Day/15 Days Discharge Instructions: Apply Zinc Oxide to periwound with each dressing change Prim Dressing: Dakin's Solution 0.125%, 16 (oz) 2 x Per Day/15 Days ary Discharge Instructions: Moisten gauze with Dakin's solution Secondary Dressing: MPM [name] SAP Bordered Dressing, 7x6.7 (Sacral) (in/in) 2 x Per Day/15 Days Discharge Instructions: Apply silicone border over primary dressing as directed. 1. Aggressive offloading 2. Dakin's wet-to-dry 3. Hydrofera Blue 4. Follow-up in 2 weeks Electronic Signature(s) Signed: 02/18/2022 1:41:40 PM By: [name] DO Entered By: [name] on 02/18/2022 13:41:12 -------------------------------------------------------------------------------- HxROS Details Patient Name: Date of Service: [name] 02/18/2022 12:30 PM Medical Record Number: [id number] Patient Account Number: [id number] Date of Birth/Sex: Treating  RN: Feb 14, 1930 (88 y.o. M) Primary Care Provider: [name] Other Clinician: Referring Provider: Treating Provider/Extender: [name] in Treatment: 6 Information Obtained From Patient Eyes Medical History: Positive for: Cataracts - surgery on both eyes Negative for: Glaucoma; Optic Neuritis Cardiovascular Medical History: Positive for: Arrhythmia - A.Fib on Eliquis Negative for: Angina; Congestive Heart Failure; Coronary Artery Disease; Deep Vein Thrombosis; Hypertension; Hypotension; Myocardial Infarction; Peripheral Arterial Disease; Peripheral Venous Disease; Phlebitis; Vasculitis Past Medical History Notes: Hx Mitral regurgitation Endocrine Medical History: Negative for: Type I Diabetes; Type II Diabetes Past Medical History Notes: Hx Hypothyroidism Genitourinary Medical History: Past Medical History Notes: Recent UTI (had sepsis) CKD stage 2 Immunological Medical History: Negative for: Lupus Erythematosus; Raynauds; Scleroderma Integumentary (Skin) Medical History: Negative for: History of Burn Musculoskeletal Medical History: Negative for: Gout; Rheumatoid Arthritis; Osteoarthritis; Osteomyelitis Past Medical History Notes: Debility/Deconditioning Neurologic Medical History: Negative for: Dementia; Neuropathy; Quadriplegia; Paraplegia; Seizure Disorder Past Medical History Notes: Pt. c/o Nerve damage in 2 fingers on Left hand (after fall) Oncologic Medical History: Negative for: Received Chemotherapy; Received Radiation Past Medical History Notes: Prostate cancer (had "seeds") HBO Extended History Items Eyes: Cataracts Immunizations Pneumococcal Vaccine: Received Pneumococcal Vaccination: Yes Received Pneumococcal Vaccination On or After 60th Birthday: Yes Implantable Devices No devices added Family and Social History Unknown History: Yes; Never smoker; Marital Status - Widowed; Alcohol Use: Never; Drug Use: No History;  Caffeine Use: Never; Financial Concerns: No; Food, Clothing or Shelter Needs: No; Support System Lacking: No; Transportation Concerns: No Electronic Signature(s) Signed: 02/18/2022 1:41:40 PM By: [name] DO Entered By: [name] on 02/18/2022 13:36:27 -------------------------------------------------------------------------------- SuperBill Details Patient Name: Date of Service: [name] 02/18/2022 Medical Record Number: [id number] Patient Account Number: [id number] Date of Birth/Sex: Treating RN: 07/05/1930 (88 y.o. [name] Primary Care Provider: [name] Other Clinician: Referring Provider: Treating Provider/Extender: [name] in Treatment: 6 Diagnosis Coding ICD-10 Codes Code Description [phone] Pressure ulcer of sacral region, stage 4 L89.610 Pressure ulcer of right heel, unstageable E03.9 Hypothyroidism, unspecified Facility Procedures CPT4 Code: [id number] Description: 99213 - WOUND CARE VISIT-LEV 3 EST PT Modifier: Quantity: 1 Physician Procedures : CPT4 Code Description Modifier [id number] - WC PHYS LEVEL 3 - EST PT ICD-10 Diagnosis Description L89.154 Pressure ulcer of sacral region, stage 4 L89.610 Pressure ulcer of right heel, unstageable E03.9  Hypothyroidism, unspecified Quantity: 1 Electronic Signature(s) Signed: 02/18/2022 1:41:40 PM By: [name] DO Entered By: [name] on 02/18/2022 13:41:23

## 2022-03-01 DIAGNOSIS — F4323 Adjustment disorder with mixed anxiety and depressed mood: Secondary | ICD-10-CM | POA: Diagnosis not present

## 2022-03-04 ENCOUNTER — Encounter (HOSPITAL_BASED_OUTPATIENT_CLINIC_OR_DEPARTMENT_OTHER): Payer: Medicare Other | Admitting: Internal Medicine

## 2022-03-05 DIAGNOSIS — L89150 Pressure ulcer of sacral region, unstageable: Secondary | ICD-10-CM | POA: Diagnosis not present

## 2022-03-05 DIAGNOSIS — E039 Hypothyroidism, unspecified: Secondary | ICD-10-CM | POA: Diagnosis not present

## 2022-03-05 DIAGNOSIS — Z7689 Persons encountering health services in other specified circumstances: Secondary | ICD-10-CM | POA: Diagnosis not present

## 2022-03-10 DIAGNOSIS — L89619 Pressure ulcer of right heel, unspecified stage: Secondary | ICD-10-CM | POA: Diagnosis not present

## 2022-03-11 ENCOUNTER — Encounter (HOSPITAL_BASED_OUTPATIENT_CLINIC_OR_DEPARTMENT_OTHER): Payer: Medicare Other | Admitting: Internal Medicine

## 2022-03-11 ENCOUNTER — Other Ambulatory Visit: Payer: Self-pay

## 2022-03-11 DIAGNOSIS — N182 Chronic kidney disease, stage 2 (mild): Secondary | ICD-10-CM | POA: Diagnosis not present

## 2022-03-11 DIAGNOSIS — L89154 Pressure ulcer of sacral region, stage 4: Secondary | ICD-10-CM | POA: Diagnosis not present

## 2022-03-11 DIAGNOSIS — L89610 Pressure ulcer of right heel, unstageable: Secondary | ICD-10-CM

## 2022-03-11 DIAGNOSIS — E039 Hypothyroidism, unspecified: Secondary | ICD-10-CM | POA: Diagnosis not present

## 2022-03-11 DIAGNOSIS — Z7901 Long term (current) use of anticoagulants: Secondary | ICD-10-CM | POA: Diagnosis not present

## 2022-03-11 DIAGNOSIS — S31809A Unspecified open wound of unspecified buttock, initial encounter: Secondary | ICD-10-CM | POA: Diagnosis not present

## 2022-03-11 DIAGNOSIS — Z8546 Personal history of malignant neoplasm of prostate: Secondary | ICD-10-CM | POA: Diagnosis not present

## 2022-03-11 NOTE — Progress Notes (Signed)
[name] ([id number]) ?Visit Report for 03/11/2022 ?Arrival Information Details ?Patient Name: Date of Service: ?[name], [name] 03/11/2022 12:30 PM ?Medical Record Number: [id number] ?Patient Account Number: [id number] ?Date of Birth/Sex: Treating RN: ?1930-01-03 (88 y.o. [name] ?Primary Care [name]: [name] Other Clinician: ?Referring [name]: ?Treating [name]/Extender: [name] ?[name] ?Weeks in Treatment: 9 ?Visit Information History Since Last Visit ?Added or deleted any medications: No ?Patient Arrived: Wheel Chair ?Any new allergies or adverse reactions: No ?Arrival Time: 12:40 ?Had a fall or experienced change in No ?Accompanied By: son ?activities of daily living that may affect ?Transfer Assistance: Manual ?risk of falls: ?Patient Identification Verified: Yes ?Signs or symptoms of abuse/neglect since last visito No ?Secondary Verification Process Completed: Yes ?Hospitalized since last visit: No ?Patient Requires Transmission-Based Precautions: No ?Implantable device outside of the clinic excluding No ?Patient Has Alerts: Yes ?cellular tissue based products placed in the center ?Patient Alerts: Patient on Blood Thinner since last visit: ?R ABI non compressible Has Dressing in Place as Prescribed: Yes ?Pain Present Now: Yes ?Electronic Signature(s) ?Signed: 03/11/2022 4:43:06 PM By: [name] ?Entered By: [name] on 03/11/2022 12:48:03 ?-------------------------------------------------------------------------------- ?Clinic Level of Care Assessment Details ?Patient Name: Date of Service: ?[name], [name] 03/11/2022 12:30 PM ?Medical Record Number: [id number] ?Patient Account Number: [id number] ?Date of Birth/Sex: Treating RN: ?May 21, 1930 (88 y.o. [name] ?Primary Care [name]: [name] Other Clinician: ?Referring [name]: ?Treating [name]/Extender: [name] ?[name] ?Weeks in Treatment: 9 ?Clinic Level of Care Assessment  Items ?TOOL 4 Quantity Score ?X- 1 0 ?Use when only an EandM is performed on FOLLOW-UP visit ?ASSESSMENTS - Nursing Assessment / Reassessment ?X- 1 10 ?Reassessment of Co-morbidities (includes updates in patient status) ?X- 1 5 ?Reassessment of Adherence to Treatment Plan ?ASSESSMENTS - Wound and Skin A ssessment / Reassessment ?[]  - 0 ?Simple Wound Assessment / Reassessment - one wound ?X- 2 5 ?Complex Wound Assessment / Reassessment - multiple wounds ?[]  - 0 ?Dermatologic / Skin Assessment (not related to wound area) ?ASSESSMENTS - Focused Assessment ?[]  - 0 ?Circumferential Edema Measurements - multi extremities ?[]  - 0 ?Nutritional Assessment / Counseling / Intervention ?[]  - 0 ?Lower Extremity Assessment (monofilament, tuning fork, pulses) ?[]  - 0 ?Peripheral Arterial Disease Assessment (using hand held doppler) ?ASSESSMENTS - Ostomy and/or Continence Assessment and Care ?[]  - 0 ?Incontinence Assessment and Management ?[]  - 0 ?Ostomy Care Assessment and Management (repouching, etc.) ?PROCESS - Coordination of Care ?[]  - 0 ?Simple Patient / Family Education for ongoing care ?X- 1 20 ?Complex (extensive) Patient / Family Education for ongoing care ?X- 1 10 ?Staff obtains Consents, Records, T Results / Process Orders ?est ?X- 1 10 ?Staff telephones HHA, Nursing Homes / Clarify orders / etc ?[]  - 0 ?Routine Transfer to another Facility (non-emergent condition) ?[]  - 0 ?Routine Hospital Admission (non-emergent condition) ?[]  - 0 ?New Admissions / Biomedical engineer / Ordering NPWT Apligraf, etc. ?, ?[]  - 0 ?Emergency Hospital Admission (emergent condition) ?[]  - 0 ?Simple Discharge Coordination ?[]  - 0 ?Complex (extensive) Discharge Coordination ?PROCESS - Special Needs ?[]  - 0 ?Pediatric / Minor Patient Management ?[]  - 0 ?Isolation Patient Management ?[]  - 0 ?Hearing / Language / Visual special needs ?[]  - 0 ?Assessment of Community assistance (transportation, D/C planning, etc.) ?[]  - 0 ?Additional  assistance / Altered mentation ?[]  - 0 ?Support Surface(s) Assessment (bed, cushion, seat, etc.) ?INTERVENTIONS - Wound Cleansing / Measurement ?X - Simple Wound Cleansing - one wound 1 5 ?[]  -  0 ?Complex Wound Cleansing - multiple wounds ?X- 1 5 ?Wound Imaging (photographs - any number of wounds) ?[]  - 0 ?Wound Tracing (instead of photographs) ?X- 1 5 ?Simple Wound Measurement - one wound ?[]  - 0 ?Complex Wound Measurement - multiple wounds ?INTERVENTIONS - Wound Dressings ?[]  - 0 ?Small Wound Dressing one or multiple wounds ?X- 1 15 ?Medium Wound Dressing one or multiple wounds ?[]  - 0 ?Large Wound Dressing one or multiple wounds ?[]  - 0 ?Application of Medications - topical ?[]  - 0 ?Application of Medications - injection ?INTERVENTIONS - Miscellaneous ?[]  - 0 ?External ear exam ?[]  - 0 ?Specimen Collection (cultures, biopsies, blood, body fluids, etc.) ?[]  - 0 ?Specimen(s) / Culture(s) sent or taken to Lab for analysis ?[]  - 0 ?Patient Transfer (multiple staff / Civil Service fast streamer / Similar devices) ?[]  - 0 ?Simple Staple / Suture removal (25 or less) ?[]  - 0 ?Complex Staple / Suture removal (26 or more) ?[]  - 0 ?Hypo / Hyperglycemic Management (close monitor of Blood Glucose) ?[]  - 0 ?Ankle / Brachial Index (ABI) - do not check if billed separately ?X- 1 5 ?Vital Signs ?Has the patient been seen at the hospital within the last three years: Yes ?Total Score: 100 ?Level Of Care: New/Established - Level 3 ?Electronic Signature(s) ?Signed: 03/11/2022 4:43:06 PM By: [name] ?Entered By: [name] on 03/11/2022 13:37:28 ?-------------------------------------------------------------------------------- ?Encounter Discharge Information Details ?Patient Name: Date of Service: ?[name], [name] 03/11/2022 12:30 PM ?Medical Record Number: [id number] ?Patient Account Number: [id number] ?Date of Birth/Sex: Treating RN: ?[birth year] (88 y.o. [name] ?Primary Care [name]: [name] Other Clinician: ?Referring  Maninder Deboer: ?Treating Agripina Guyette/Extender: [name] ?[name] ?Weeks in Treatment: 9 ?Encounter Discharge Information Items ?Discharge Condition: Stable ?Ambulatory Status: Wheelchair ?Discharge Destination: [place] ?Orders Sent: Yes ?Transportation: Other ?Accompanied By: Son ?Schedule Follow-up Appointment: Yes ?Clinical Summary of Care: Provided on 03/11/2022 ?Form Type Recipient ?Paper Patient Patient ?Electronic Signature(s) ?Signed: 03/11/2022 2:40:49 PM By: [name] ?Entered By: [name] on 03/11/2022 14:40:48 ?-------------------------------------------------------------------------------- ?Lower Extremity Assessment Details ?Patient Name: Date of Service: ?[name], [name] 03/11/2022 12:30 PM ?Medical Record Number: [id number] ?Patient Account Number: [id number] ?Date of Birth/Sex: Treating RN: ?[birth year] (88 y.o. [name] ?Primary Care Nahia Nissan: [name] Other Clinician: ?Referring Brayden Brodhead: ?Treating Anel Creighton/Extender: [name] ?[name] ?Weeks in Treatment: 9 ?Edema Assessment ?Assessed: [Left: No] [Right: Yes] ?Edema: [Left: N] [Right: o] ?Calf ?Left: Right: ?Point of Measurement: 32 cm From Medial Instep 30 cm ?Ankle ?Left: Right: ?Point of Measurement: 12 cm From Medial Instep 20 cm ?Vascular Assessment ?Pulses: ?Dorsalis Pedis ?Palpable: [Right:Yes] ?Electronic Signature(s) ?Signed: 03/11/2022 4:43:06 PM By: [name] ?Entered By: [name] on 03/11/2022 12:55:23 ?-------------------------------------------------------------------------------- ?Multi Wound Chart Details ?Patient Name: ?Date of Service: ?[name], [name] 03/11/2022 12:30 PM ?Medical Record Number: [id number] ?Patient Account Number: [id number] ?Date of Birth/Sex: ?Treating RN: ?1930-02-11 (88 y.o. [name] ?Primary Care Kehinde Bowdish: [name] ?Other Clinician: ?Referring Camilo Mander: ?Treating Emelee Rodocker/Extender: [name] ?[name] ?Weeks in  Treatment: 9 ?Vital Signs ?Height(in): 73 ?Pulse(bpm): 66 ?Weight(lbs): 145 ?Blood Pressure(mmHg): 100/58 ?Body Mass Index(BMI): 19.1 ?Temperature(??F): 98.5 ?Respiratory Rate(breaths/min): 18 ?Photos: [N/A:N/A] ?Right Cal

## 2022-03-11 NOTE — Progress Notes (Signed)
Jerry, [name] ([id number]) ?Visit Report for 03/11/2022 ?Chief Complaint Document Details ?Patient Name: Date of Service: ?[name], [name] 03/11/2022 12:30 PM ?Medical Record Number: [id number] ?Patient Account Number: [id number] ?Date of Birth/Sex: Treating RN: ?May 10, 1930 (88 y.o. [name] ?Primary Care Provider: [name] Other Clinician: ?Referring Provider: ?Treating Provider/Extender: [name] ?[name] ?Weeks in Treatment: 9 ?Information Obtained from: Patient ?Chief Complaint ?Sacral and left heel pressure ulcers ?Electronic Signature(s) ?Signed: 03/11/2022 1:40:25 PM By: [name] DO ?Entered By: [name] on 03/11/2022 13:33:03 ?-------------------------------------------------------------------------------- ?HPI Details ?Patient Name: Date of Service: ?[name], Jerry 03/11/2022 12:30 PM ?Medical Record Number: [id number] ?Patient Account Number: [id number] ?Date of Birth/Sex: Treating RN: ?05/04/1930 (88 y.o. [name] ?Primary Care Provider: [name] Other Clinician: ?Referring Provider: ?Treating Provider/Extender: [name] ?[name] ?Weeks in Treatment: 9 ?History of Present Illness ?HPI Description: Admission 01/07/2022 ?Mr. Clemons Bulthuis is an 88 year old male with a past medical history of prostate cancer, hypothyroidism and celiac disease that presents to the clinic for a 1 ?month history of ulcers to his sacrum and right heel. He was admitted to the hospital on 12/05/2021 for rhabdomyolysis secondary to being on the floor for 2 ?days without being able to get up. He developed a pressure ulcer to his sacrum and right heel. In the hospital they were doing hydrotherapy. He was discharged ?on 12/27 to a skilled nursing facility. He has been using wet-to-dry dressings with Santyl to the sacrum and Betadine to the right heel wound. He currently ?denies systemic signs of infection. He has Prevalon boots and a group 2 air mattress. He has a  hard time offloading the sacrum and is on his back most of ?the day. ?2/2; patient presents for follow-up. Santyl has been used to the right heel wound and Dakin's wet-to-dry dressings to the sacral wound. He states he had ?imaging done to both wound bed areas. He has no issues or complaints today. He denies signs of infection. ?2/16; patient presents for follow-up. He is using Dakin's to the sacral wound and Santyl to the left heel wound. He has no issues or complaints today. He ?denies signs of infection. Unfortunately he is unable to offload the sacral wound bed. He either sits in the bed all day or he is in a wheelchair. ?3/2; patient presents for follow-up. He has been using Dakin's wet-to-dry dressing to the sacral wound and Hydrofera Blue to the left heel wound. He has no ?issues or complaints today. He denies signs of infection. ?3/23; patient presents for follow-up. He continues to use Dakin's wet-to-dry dressings to the sacral wound and Hydrofera Blue to the heel wound. The heel ?wound is closed. Unfortunately we have never been able to get the x-ray results of the sacrum ordered several clinic visits ago. ?Electronic Signature(s) ?Signed: 03/11/2022 1:40:25 PM By: [name] DO ?Entered By: [name] on 03/11/2022 13:34:05 ?-------------------------------------------------------------------------------- ?Physical Exam Details ?Patient Name: Date of Service: ?[name], Jerry 03/11/2022 12:30 PM ?Medical Record Number: [id number] ?Patient Account Number: [id number] ?Date of Birth/Sex: Treating RN: ?10-Nov-1930 (88 y.o. [name] ?Primary Care Provider: [name] Other Clinician: ?Referring Provider: ?Treating Provider/Extender: [name] ?[name] ?Weeks in Treatment: 9 ?Constitutional ?respirations regular, non-labored and within target range for patient.Marland Kitchen ?Cardiovascular ?2+ dorsalis pedis/posterior tibialis pulses. ?Psychiatric ?pleasant and cooperative. ?Notes ?Right  lower extremity: T the heel there is epithelization to the previous wound site. ?o ?T the sacrum there is an open wound with granulation tissue and undermining.  No surrounding signs of infection. ?o ?Electronic Signature(s) ?Signed: 03/11/2022 1:40:25 PM By: [name] DO ?Entered By: [name] on 03/11/2022 13:34:40 ?-------------------------------------------------------------------------------- ?Physician Orders Details ?Patient Name: Date of Service: ?[name], Jerry 03/11/2022 12:30 PM ?Medical Record Number: [id number] ?Patient Account Number: [id number] ?Date of Birth/Sex: Treating RN: ?[birth year] (88 y.o. [name] ?Primary Care Provider: [name] Other Clinician: ?Referring Provider: ?Treating Provider/Extender: [name] ?[name] ?Weeks in Treatment: 9 ?Verbal / Phone Orders: No ?Diagnosis Coding ?Follow-up Appointments ?ppointment in 2 weeks. - Dr Heber Sawyer [facility], Room7) ?Return A ?Other: - **Repeat X ray of Sacrum. Please fax results to [facility] @ [phone]** ?Bathing/ Shower/ Hygiene ?Other Bathing/Shower/Hygiene Orders/Instructions: - May have a sponge bath or shower ?Negative Presssure Wound Therapy ?Other: - Will consider wound vac at next visit if receive Xray results ?Edema Control - Lymphedema / SCD / Other ?Right Lower Extremity ?Elevate legs to the level of the heart or above for 30 minutes daily and/or when sitting, a frequency of: - Throughout the day ?Moisturize legs daily. ?Off-Loading ?Heel suspension boot to: - Prevalon Boots to bilateral feet ?Low air-loss mattress (Group 2) ?Gel wheelchair cushion ?Turn and reposition every 2 hours ?Additional Orders / Instructions ?Follow Nutritious Diet - High protein for wound healing ?Non Wound Condition ?Protect area with: - Apply Allevyn foam border dressing to right heal to protect newly healed area. ?Wound Treatment ?Wound #2 - Sacrum ?Cleanser: Wound Cleanser 2 x Per Day/15 Days ?Discharge Instructions: Cleanse  the wound with wound cleanser prior to applying a clean dressing using gauze sponges, not tissue or cotton balls. ?Peri-Wound Care: Zinc Oxide Ointment 30g tube 2 x Per Day/15 Days ?Discharge Instructions: Apply Zinc Oxide to periwound with each dressing change ?Prim Dressing: Dakin's Solution 0.125%, 16 (oz) 2 x Per Day/15 Days ?ary ?Discharge Instructions: Moisten gauze with Dakin's solution ?Secondary Dressing: MPM [name] SAP Bordered Dressing, 7x6.7 (Sacral) (in/in) 2 x Per Day/15 Days ?Discharge Instructions: Apply silicone border over primary dressing as directed. ?Radiology ?X-ray, Sacrum - Chronic sacral wound ?Electronic Signature(s) ?Signed: 03/11/2022 1:40:25 PM By: [name] DO ?Entered By: [name] on 03/11/2022 13:34:56 ?Prescription 03/11/2022 ?-------------------------------------------------------------------------------- ?Kuper, Capers C. [name] DO ?Patient Name: ?Provider: ?1930/11/05 [id number] ?Date of Birth: ?NPI#: ?Jerilynn Mages [id number] ?Sex: ?DEA #: ?[phone] ?Phone #: ?License #: ?[place] ?Patient Address: ?[address] ?[place], [place] [address] [address] ?[place], [place] [address] ?[phone] ?Allergies ?No Known Allergies ?Provider's Orders ?X-ray, Sacrum - Chronic sacral wound ?Hand Signature: ?Date(s): ?Electronic Signature(s) ?Signed: 03/11/2022 1:40:25 PM By: [name] DO ?Entered By: [name] on 03/11/2022 13:34:56 ?-------------------------------------------------------------------------------- ?Problem List Details ?Patient Name: ?Date of Service: ?JAVI, BOLLMAN 03/11/2022 12:30 PM ?Medical Record Number: [id number] ?Patient Account Number: [id number] ?Date of Birth/Sex: ?Treating RN: ?1930/01/13 (88 y.o. [name] ?Primary Care Provider: ?Other Clinician: ?[name] ?Referring Provider: ?Treating Provider/Extender: [name] ?[name] ?Weeks in Treatment:  9 ?Active Problems ?ICD-10 ?Encounter ?Code Description Active Date MDM ?Diagnosis ?L89.154 Pressure ulcer of sacral region, stage 4 01/07/2022 No Yes ?L89.610 Pressure ulcer of right heel, unstageable 01/07/19

## 2022-03-12 DIAGNOSIS — L89159 Pressure ulcer of sacral region, unspecified stage: Secondary | ICD-10-CM | POA: Diagnosis not present

## 2022-03-23 ENCOUNTER — Telehealth: Payer: Self-pay | Admitting: Internal Medicine

## 2022-03-23 NOTE — Telephone Encounter (Signed)
LVM for pt to rtn my call to schedule AWV with [facility]. Please schedule this appt if pt calls the office.  ?

## 2022-03-25 ENCOUNTER — Emergency Department (HOSPITAL_COMMUNITY)
Admission: EM | Admit: 2022-03-25 | Discharge: 2022-03-25 | Disposition: A | Discharge status: Skilled Nursing Facility | Payer: Medicare Other | Attending: Emergency Medicine | Admitting: Emergency Medicine

## 2022-03-25 ENCOUNTER — Encounter (HOSPITAL_COMMUNITY): Payer: Self-pay | Admitting: Emergency Medicine

## 2022-03-25 ENCOUNTER — Encounter (HOSPITAL_BASED_OUTPATIENT_CLINIC_OR_DEPARTMENT_OTHER): Payer: Medicare Other | Attending: Internal Medicine | Admitting: Internal Medicine

## 2022-03-25 ENCOUNTER — Other Ambulatory Visit: Payer: Self-pay

## 2022-03-25 DIAGNOSIS — I951 Orthostatic hypotension: Secondary | ICD-10-CM

## 2022-03-25 DIAGNOSIS — L89610 Pressure ulcer of right heel, unstageable: Secondary | ICD-10-CM | POA: Insufficient documentation

## 2022-03-25 DIAGNOSIS — K900 Celiac disease: Secondary | ICD-10-CM | POA: Diagnosis not present

## 2022-03-25 DIAGNOSIS — D649 Anemia, unspecified: Secondary | ICD-10-CM | POA: Diagnosis not present

## 2022-03-25 DIAGNOSIS — M6282 Rhabdomyolysis: Secondary | ICD-10-CM | POA: Diagnosis not present

## 2022-03-25 DIAGNOSIS — R001 Bradycardia, unspecified: Secondary | ICD-10-CM | POA: Insufficient documentation

## 2022-03-25 DIAGNOSIS — E039 Hypothyroidism, unspecified: Secondary | ICD-10-CM

## 2022-03-25 DIAGNOSIS — L89154 Pressure ulcer of sacral region, stage 4: Secondary | ICD-10-CM

## 2022-03-25 DIAGNOSIS — Z7901 Long term (current) use of anticoagulants: Secondary | ICD-10-CM | POA: Diagnosis not present

## 2022-03-25 DIAGNOSIS — Z8546 Personal history of malignant neoplasm of prostate: Secondary | ICD-10-CM | POA: Diagnosis not present

## 2022-03-25 DIAGNOSIS — R7989 Other specified abnormal findings of blood chemistry: Secondary | ICD-10-CM | POA: Insufficient documentation

## 2022-03-25 DIAGNOSIS — Z79899 Other long term (current) drug therapy: Secondary | ICD-10-CM | POA: Insufficient documentation

## 2022-03-25 DIAGNOSIS — I10 Essential (primary) hypertension: Secondary | ICD-10-CM | POA: Diagnosis not present

## 2022-03-25 DIAGNOSIS — R55 Syncope and collapse: Secondary | ICD-10-CM | POA: Insufficient documentation

## 2022-03-25 DIAGNOSIS — R42 Dizziness and giddiness: Secondary | ICD-10-CM | POA: Diagnosis not present

## 2022-03-25 LAB — CBC WITH DIFFERENTIAL/PLATELET
Abs Immature Granulocytes: 0.07 10*3/uL (ref 0.00–0.07)
Basophils Absolute: 0 10*3/uL (ref 0.0–0.1)
Basophils Relative: 0 %
Eosinophils Absolute: 0.1 10*3/uL (ref 0.0–0.5)
Eosinophils Relative: 1 %
HCT: 33.9 % — ABNORMAL LOW (ref 39.0–52.0)
Hemoglobin: 10.7 g/dL — ABNORMAL LOW (ref 13.0–17.0)
Immature Granulocytes: 1 %
Lymphocytes Relative: 21 %
Lymphs Abs: 1.7 10*3/uL (ref 0.7–4.0)
MCH: 33 pg (ref 26.0–34.0)
MCHC: 31.6 g/dL (ref 30.0–36.0)
MCV: 104.6 fL — ABNORMAL HIGH (ref 80.0–100.0)
Monocytes Absolute: 0.6 10*3/uL (ref 0.1–1.0)
Monocytes Relative: 8 %
Neutro Abs: 5.5 10*3/uL (ref 1.7–7.7)
Neutrophils Relative %: 69 %
Platelets: 292 10*3/uL (ref 150–400)
RBC: 3.24 MIL/uL — ABNORMAL LOW (ref 4.22–5.81)
RDW: 14.9 % (ref 11.5–15.5)
WBC: 8 10*3/uL (ref 4.0–10.5)
nRBC: 0 % (ref 0.0–0.2)

## 2022-03-25 LAB — COMPREHENSIVE METABOLIC PANEL
ALT: 30 U/L (ref 0–44)
AST: 37 U/L (ref 15–41)
Albumin: 3 g/dL — ABNORMAL LOW (ref 3.5–5.0)
Alkaline Phosphatase: 85 U/L (ref 38–126)
Anion gap: 5 (ref 5–15)
BUN: 27 mg/dL — ABNORMAL HIGH (ref 8–23)
CO2: 27 mmol/L (ref 22–32)
Calcium: 8.5 mg/dL — ABNORMAL LOW (ref 8.9–10.3)
Chloride: 104 mmol/L (ref 98–111)
Creatinine, Ser: 0.61 mg/dL (ref 0.61–1.24)
GFR, Estimated: >60 mL/min (ref >60)
Glucose, Bld: 130 mg/dL — ABNORMAL HIGH (ref 70–99)
Potassium: 4.3 mmol/L (ref 3.5–5.1)
Sodium: 136 mmol/L (ref 135–145)
Total Bilirubin: 0.6 mg/dL (ref 0.3–1.2)
Total Protein: 7 g/dL (ref 6.5–8.1)

## 2022-03-25 LAB — MAGNESIUM: Magnesium: 2.3 mg/dL (ref 1.7–2.4)

## 2022-03-25 LAB — DIGOXIN LEVEL: Digoxin Level: 1.4 ng/mL (ref 0.8–2.0)

## 2022-03-25 MED ORDER — SODIUM CHLORIDE 0.9 % IV BOLUS
500.0000 mL | Freq: Once | INTRAVENOUS | Status: AC
  Administered 2022-03-25: 500 mL via INTRAVENOUS

## 2022-03-25 NOTE — Progress Notes (Signed)
[name] ([id number]) ?Visit Report for 03/25/2022 ?Arrival Information Details ?Patient Name: Date of Service: ?[name], [name] 03/25/2022 12:30 PM ?Medical Record Number: [id number] ?Patient Account Number: [id number] ?Date of Birth/Sex: Treating RN: ?03-04-1930 (88 y.o. [name] ?Primary Care Seraiah Nowack: [name] Other Clinician: ?Referring Ulis Kaps: ?Treating Aftyn Nott/Extender: [name] ?[name] ?Weeks in Treatment: 11 ?Visit Information History Since Last Visit ?All ordered tests and consults were completed: Yes ?Patient Arrived: Wheel Chair ?Added or deleted any medications: No ?Arrival Time: 12:43 ?Any new allergies or adverse reactions: No ?Accompanied By: son ?Had a fall or experienced change in No ?Transfer Assistance: Manual ?activities of daily living that may affect ?Patient Identification Verified: Yes ?risk of falls: ?Secondary Verification Process Completed: Yes ?Signs or symptoms of abuse/neglect since last visito No ?Patient Requires Transmission-Based Precautions: No ?Hospitalized since last visit: No ?Patient Has Alerts: Yes ?Implantable device outside of the clinic excluding No ?Patient Alerts: Patient on Blood Thinner cellular tissue based products placed in the center ?R ABI non compressible since last visit: ?Has Dressing in Place as Prescribed: Yes ?Pain Present Now: No ?Electronic Signature(s) ?Signed: 03/25/2022 5:51:15 PM By: [name] ?Entered By: [name] on 03/25/2022 12:49:41 ?-------------------------------------------------------------------------------- ?Clinic Level of Care Assessment Details ?Patient Name: Date of Service: ?[name], [name] 03/25/2022 12:30 PM ?Medical Record Number: [id number] ?Patient Account Number: [id number] ?Date of Birth/Sex: Treating RN: ?1930-11-06 (88 y.o. [name] ?Primary Care Wynn Alldredge: [name] Other Clinician: ?Referring Huberta Tompkins: ?Treating Hallie Ertl/Extender: [name] ?[name] ?Weeks in  Treatment: 11 ?Clinic Level of Care Assessment Items ?TOOL 4 Quantity Score ?X- 1 0 ?Use when only an EandM is performed on FOLLOW-UP visit ?ASSESSMENTS - Nursing Assessment / Reassessment ?X- 1 10 ?Reassessment of Co-morbidities (includes updates in patient status) ?X- 1 5 ?Reassessment of Adherence to Treatment Plan ?ASSESSMENTS - Wound and Skin A ssessment / Reassessment ?X - Simple Wound Assessment / Reassessment - one wound 1 5 ?[]  - 0 ?Complex Wound Assessment / Reassessment - multiple wounds ?[]  - 0 ?Dermatologic / Skin Assessment (not related to wound area) ?ASSESSMENTS - Focused Assessment ?[]  - 0 ?Circumferential Edema Measurements - multi extremities ?[]  - 0 ?Nutritional Assessment / Counseling / Intervention ?[]  - 0 ?Lower Extremity Assessment (monofilament, tuning fork, pulses) ?[]  - 0 ?Peripheral Arterial Disease Assessment (using hand held doppler) ?ASSESSMENTS - Ostomy and/or Continence Assessment and Care ?[]  - 0 ?Incontinence Assessment and Management ?[]  - 0 ?Ostomy Care Assessment and Management (repouching, etc.) ?PROCESS - Coordination of Care ?[]  - 0 ?Simple Patient / Family Education for ongoing care ?X- 1 20 ?Complex (extensive) Patient / Family Education for ongoing care ?X- 1 10 ?Staff obtains Consents, Records, T Results / Process Orders ?est ?X- 1 10 ?Staff telephones HHA, Nursing Homes / Clarify orders / etc ?[]  - 0 ?Routine Transfer to another Facility (non-emergent condition) ?[]  - 0 ?Routine Hospital Admission (non-emergent condition) ?[]  - 0 ?New Admissions / Biomedical engineer / Ordering NPWT Apligraf, etc. ?, ?[]  - 0 ?Emergency Hospital Admission (emergent condition) ?[]  - 0 ?Simple Discharge Coordination ?[]  - 0 ?Complex (extensive) Discharge Coordination ?PROCESS - Special Needs ?[]  - 0 ?Pediatric / Minor Patient Management ?[]  - 0 ?Isolation Patient Management ?[]  - 0 ?Hearing / Language / Visual special needs ?[]  - 0 ?Assessment of Community assistance (transportation,  D/C planning, etc.) ?[]  - 0 ?Additional assistance / Altered mentation ?[]  - 0 ?Support Surface(s) Assessment (bed, cushion, seat, etc.) ?INTERVENTIONS - Wound Cleansing / Measurement ?X - Simple  Wound Cleansing - one wound 1 5 ?[]  - 0 ?Complex Wound Cleansing - multiple wounds ?X- 1 5 ?Wound Imaging (photographs - any number of wounds) ?[]  - 0 ?Wound Tracing (instead of photographs) ?X- 1 5 ?Simple Wound Measurement - one wound ?[]  - 0 ?Complex Wound Measurement - multiple wounds ?INTERVENTIONS - Wound Dressings ?[]  - 0 ?Small Wound Dressing one or multiple wounds ?X- 1 15 ?Medium Wound Dressing one or multiple wounds ?[]  - 0 ?Large Wound Dressing one or multiple wounds ?[]  - 0 ?Application of Medications - topical ?[]  - 0 ?Application of Medications - injection ?INTERVENTIONS - Miscellaneous ?[]  - 0 ?External ear exam ?[]  - 0 ?Specimen Collection (cultures, biopsies, blood, body fluids, etc.) ?[]  - 0 ?Specimen(s) / Culture(s) sent or taken to Lab for analysis ?[]  - 0 ?Patient Transfer (multiple staff / Civil Service fast streamer / Similar devices) ?[]  - 0 ?Simple Staple / Suture removal (25 or less) ?[]  - 0 ?Complex Staple / Suture removal (26 or more) ?[]  - 0 ?Hypo / Hyperglycemic Management (close monitor of Blood Glucose) ?[]  - 0 ?Ankle / Brachial Index (ABI) - do not check if billed separately ?X- 1 5 ?Vital Signs ?Has the patient been seen at the hospital within the last three years: Yes ?Total Score: 95 ?Level Of Care: New/Established - Level 3 ?Electronic Signature(s) ?Signed: 03/25/2022 5:51:15 PM By: [name] ?Entered By: [name] on 03/25/2022 13:16:16 ?-------------------------------------------------------------------------------- ?Encounter Discharge Information Details ?Patient Name: Date of Service: ?[name], [name] 03/25/2022 12:30 PM ?Medical Record Number: [id number] ?Patient Account Number: [id number] ?Date of Birth/Sex: Treating RN: ?05-May-1930 (88 y.o. [name] ?Primary Care Dionte Blaustein: [name] Other Clinician: ?Referring Isabella Ida: ?Treating Merridy Pascoe/Extender: [name] ?[name] ?Weeks in Treatment: 11 ?Encounter Discharge Information Items ?Discharge Condition: Unstable ?Ambulatory Status: Stretcher ?Discharge Destination: Emergency Room ?Orders Sent: Yes ?Transportation: Ambulance ?Accompanied By: Son ?Schedule Follow-up Appointment: Yes ?Clinical Summary of Care: Provided on 03/25/2022 ?Form Type Recipient ?Paper Patient Patient/Facility ?Notes ?Patient had hypotensive syncopal episode when transferring to his wheelchair. EMS called. Patient was unresponsive for 2-3 minutes but came around when EMS ?arrived. For precaution patient transferred to ED for further evaluation. ?Electronic Signature(s) ?Signed: 03/25/2022 2:19:31 PM By: [name] ?Entered By: [name] on 03/25/2022 14:19:30 ?-------------------------------------------------------------------------------- ?Lower Extremity Assessment Details ?Patient Name: Date of Service: ?[name], [name] 03/25/2022 12:30 PM ?Medical Record Number: [id number] ?Patient Account Number: [id number] ?Date of Birth/Sex: Treating RN: ?03-31-1930 (88 y.o. [name] ?Primary Care Calton Harshfield: [name] Other Clinician: ?Referring Stavros Cail: ?Treating Lun Muro/Extender: [name] ?[name] ?Weeks in Treatment: 11 ?Electronic Signature(s) ?Signed: 03/25/2022 5:51:15 PM By: [name] ?Entered By: [name] on 03/25/2022 12:55:32 ?-------------------------------------------------------------------------------- ?Multi Wound Chart Details ?Patient Name: ?Date of Service: ?[name], [name] 03/25/2022 12:30 PM ?Medical Record Number: [id number] ?Patient Account Number: [id number] ?Date of Birth/Sex: ?Treating RN: ?1930/12/06 (88 y.o. [name] ?Primary Care Lalani Winkles: [name] ?Other Clinician: ?Referring Staley Lunz: ?Treating Jontez Redfield/Extender: [name] ?[name] ?Weeks in Treatment: 11 ?Vital  Signs ?Height(in): 73 ?Pulse(bpm): 61 ?Weight(lbs): 145 ?Blood Pressure(mmHg): 101/65 ?Body Mass Index(BMI): 19.1 ?Temperature(??F): 97.9 ?Respiratory Rate(breaths/min): 16 ?Photos: [N/A:N/A] ?Sacrum N/A N/A ?Wound

## 2022-03-25 NOTE — ED Triage Notes (Signed)
Pt to ER via EMS from wound center.  Pt was standing from his wheelchair when he had a syncopal episode.  Pt reports low po intake today and has not eaten lunch at this time.  Pt was initially dizzy on EMS arrival, reports feeling "fine" on arrival to ER. ?

## 2022-03-25 NOTE — ED Provider Notes (Signed)
?[facility] ?Provider Note ? ? ?CSN: [id number] ?Arrival date & time: 03/25/22  1402 ? ?  ? ?History ? ?Chief Complaint  ?Patient presents with  ? Loss of Consciousness  ? ? ?[name] is a 88 y.o. male. ? ?HPI ? ?88 year old male with past medical history of atrial fibrillation on anticoagulation, HTN, HLD presents to the emergency department with a syncopal episode.  Patient is from a living facility.  He was at his wound care appointment today for an improving sacral wound.  After his exam when his son and nurse was moving him from the exam table to the wheelchair he felt "lightheaded" and this resulted in a syncopal episode.  The son describes this as the patient slumping over in the wheelchair with his eyes open, "out of it" for about 2 minutes.  He then woke up and started responding.  Blood pressure in the office at that time was 70/40.  EMS arrived and the blood pressure was improved, he received IV fluids in route.  Currently the patient feels back to baseline.  He admits to eating a small breakfast and low lunch.  No recent illness, fever, cough, GI illness, urinary symptoms.  Denies any chest pain.  Has been compliant with his medications. ? ?Home Medications ?Prior to Admission medications   ?Medication Sig Start Date End Date Taking? Authorizing Provider  ?amiodarone (PACERONE) 200 MG tablet Take 1 tablet (200 mg total) by mouth daily. 12/15/21   Shelly Coss, MD  ?apixaban (ELIQUIS) 5 MG TABS tablet Take 1 tablet (5 mg total) by mouth 2 (two) times daily. 03/26/21   Lelon Perla, MD  ?atorvastatin (LIPITOR) 20 MG tablet TAKE 1 TABLET BY MOUTH EVERY DAY ?Patient taking differently: Take 20 mg by mouth daily. 09/30/21   [name], MD  ?collagenase (SANTYL) ointment Apply topically daily. 12/15/21   Shelly Coss, MD  ?cyanocobalamin 1000 MCG tablet Take 1,000 mcg by mouth daily.     [provider]  ?dapsone 100 MG tablet TAKE 1/2 TABLET BY  MOUTH DAILY 12/23/21   [name], MD  ?digoxin (LANOXIN) 0.125 MG tablet Take 1 tablet (0.125 mg total) by mouth daily. 12/15/21   Shelly Coss, MD  ?feeding supplement (ENSURE ENLIVE / ENSURE PLUS) LIQD Take 237 mLs by mouth 3 (three) times daily between meals. 12/14/21   Shelly Coss, MD  ?levothyroxine (SYNTHROID) 50 MCG tablet TAKE 1 TABLET BY MOUTH EVERY DAY ?Patient taking differently: Take 50 mcg by mouth daily before breakfast. 09/07/21   [name], MD  ?polyethylene glycol (MIRALAX / GLYCOLAX) 17 g packet Take 17 g by mouth daily. 12/15/21   Shelly Coss, MD  ?senna-docusate (SENOKOT-S) 8.6-50 MG tablet Take 1 tablet by mouth 2 (two) times daily. 12/14/21   Shelly Coss, MD  ?   ? ?Allergies    ?Patient has no known allergies.   ? ?Review of Systems   ?Review of Systems  ?Constitutional:  Negative for fever.  ?Respiratory:  Negative for shortness of breath.   ?Cardiovascular:  Negative for chest pain.  ?Gastrointestinal:  Negative for abdominal pain, diarrhea and vomiting.  ?Skin:  Negative for rash.  ?     + chronic sacral wound  ?Neurological:  Positive for syncope and light-headedness. Negative for facial asymmetry, speech difficulty, weakness, numbness and headaches.  ? ?Physical Exam ?Updated Vital Signs ?BP 118/75   Pulse (!) 49   Temp 97.7 ?F (36.5 ?C) (Oral)   Resp  20   Ht 6' 1"  (1.854 m)   Wt 65.8 kg   SpO2 96%   BMI 19.13 kg/m?  ?Physical Exam ?Vitals and nursing note reviewed.  ?Constitutional:   ?   General: He is not in acute distress. ?   Appearance: Normal appearance. He is not diaphoretic.  ?HENT:  ?   Head: Normocephalic.  ?   Mouth/Throat:  ?   Mouth: Mucous membranes are moist.  ?Eyes:  ?   Pupils: Pupils are equal, round, and reactive to light.  ?Cardiovascular:  ?   Rate and Rhythm: Bradycardia present.  ?Pulmonary:  ?   Effort: Pulmonary effort is normal. No respiratory distress.  ?   Breath sounds: No rales.  ?Abdominal:  ?   Palpations: Abdomen is soft.  ?    Tenderness: There is no abdominal tenderness.  ?Musculoskeletal:     ?   General: No swelling.  ?   Cervical back: No tenderness.  ?Skin: ?   General: Skin is warm.  ?Neurological:  ?   Mental Status: He is alert and oriented to person, place, and time. Mental status is at baseline.  ?Psychiatric:     ?   Mood and Affect: Mood normal.  ? ? ?ED Results / Procedures / Treatments   ?Labs ?(all labs ordered are listed, but only abnormal results are displayed) ?Labs Reviewed  ?CBC WITH DIFFERENTIAL/PLATELET - Abnormal; Notable for the following components:  ?    Result Value  ? RBC 3.24 (*)   ? Hemoglobin 10.7 (*)   ? HCT 33.9 (*)   ? MCV 104.6 (*)   ? All other components within normal limits  ?COMPREHENSIVE METABOLIC PANEL - Abnormal; Notable for the following components:  ? Glucose, Bld 130 (*)   ? BUN 27 (*)   ? Calcium 8.5 (*)   ? Albumin 3.0 (*)   ? All other components within normal limits  ?MAGNESIUM  ?DIGOXIN LEVEL  ? ? ?EKG ?EKG Interpretation ? ?Date/Time:  Thursday March 25 2022 14:15:56 EDT ?Ventricular Rate:  53 ?PR Interval:    ?QRS Duration: 94 ?QT Interval:  599 ?QTC Calculation: 563 ?R Axis:   -88 ?Text Interpretation: Atrial fibrillation Left anterior fascicular block Probable anterior infarct, age indeterminate Prolonged QT interval Confirmed by Lavenia Atlas [phone]) on 03/25/2022 3:00:01 PM ? ?Radiology ?No results found. ? ?Procedures ?Procedures  ? ? ?Medications Ordered in ED ?Medications - No data to display ? ?ED Course/ Medical Decision Making/ A&P ?  ?                        ?Medical Decision Making ?Amount and/or Complexity of Data Reviewed ?Labs: ordered. ? ? ?88 year old male presents emergency department with a witnessed syncopal episode.  No fall to the ground or head injury.  Positive loss of consciousness.  Admits that he did not eat a full breakfast and/or lunch with decreased fluid intake today.  Was noted to be hypotensive after the syncopal episode in the doctor's office. ? ?EKG  shows atrial fibrillation which is baseline for the patient, however more bradycardic than before, prolonged QTc which she has also had in the past.  Patient currently has no complaints, no ischemic changes, no chest pain. ? ?Blood work shows a baseline anemia, slight elevation in BUN.  Normal electrolytes, normal magnesium.  Plan for IVF, orthostatic vitals and digoxin level. Patient signed out to Dr. Melina Copa.  ? ? ? ? ? ? ? ?Final  Clinical Impression(s) / ED Diagnoses ?Final diagnoses:  ?None  ? ? ?Rx / DC Orders ?ED Discharge Orders   ? ? None  ? ?  ? ? ?  ?Lorelle Gibbs, DO ?03/25/22 1638 ? ?

## 2022-03-25 NOTE — ED Provider Notes (Signed)
From Dr. Dina Rich.  88 year old male A-fib anticoagulation here after syncopal event at his wound care appointment today.  Sounds like he was hypotensive initially which is corrected.  Patient back to baseline.  Plan is to follow-up on digoxin level and orthostatics.  Consider cardiology input if still symptomatic or dig elevated.  Otherwise can return to his facility. ?Physical Exam  ?BP 118/75   Pulse (!) 49   Temp 97.7 ?F (36.5 ?C) (Oral)   Resp 20   Ht 6' 1"  (1.854 m)   Wt 65.8 kg   SpO2 96%   BMI 19.13 kg/m?  ? ?Physical Exam ? ?Procedures  ?Procedures ? ?ED Course / MDM  ?  ?Medical Decision Making ?Amount and/or Complexity of Data Reviewed ?Labs: ordered. ? ? ?Patient's digoxin is not supratherapeutic.  I went and talked to the patient and his son.  Patient feels back to baseline.  He is asking for something to drink.  Son said he does not usually stand and so is why he thinks he probably passed out.  He has been at a facility and not ambulatory which led to his sacral wound and needing to go to wound clinic.  Patient would like to return back to facility but needs an ambulance will arrange PTAR. ? ? ? ? ?  ?Hayden Rasmussen, MD ?03/26/22 1019 ? ?

## 2022-03-25 NOTE — ED Notes (Signed)
PT resting on stretcher with Son by the bedside awaiting PTAR for transport back to facility. ?

## 2022-03-25 NOTE — Progress Notes (Signed)
[name], [name] ([id number]) ?Visit Report for 03/25/2022 ?Chief Complaint Document Details ?Patient Name: Date of Service: ?[name], [name] 03/25/2022 12:30 PM ?Medical Record Number: [id number] ?Patient Account Number: [id number] ?Date of Birth/Sex: Treating RN: ?1930-01-15 (88 y.o. [name] ?Primary Care Provider: [name] Other Clinician: ?Referring Provider: ?Treating Provider/Extender: [name] ?[name] ?Weeks in Treatment: 11 ?Information Obtained from: Patient ?Chief Complaint ?Sacral and left heel pressure ulcers ?Electronic Signature(s) ?Signed: 03/25/2022 2:10:58 PM By: [name] DO ?Entered By: [name] on 03/25/2022 14:03:21 ?-------------------------------------------------------------------------------- ?HPI Details ?Patient Name: Date of Service: ?[name], [name] 03/25/2022 12:30 PM ?Medical Record Number: [id number] ?Patient Account Number: [id number] ?Date of Birth/Sex: Treating RN: ?Feb 26, 1930 (88 y.o. [name] ?Primary Care Provider: [name] Other Clinician: ?Referring Provider: ?Treating Provider/Extender: [name] ?[name] ?Weeks in Treatment: 11 ?History of Present Illness ?HPI Description: Admission 01/07/2022 ?Mr. Clemons Star is an 88 year old male with a past medical history of prostate cancer, hypothyroidism and celiac disease that presents to the clinic for a 1 ?month history of ulcers to his sacrum and right heel. He was admitted to the hospital on 12/05/2021 for rhabdomyolysis secondary to being on the floor for 2 ?days without being able to get up. He developed a pressure ulcer to his sacrum and right heel. In the hospital they were doing hydrotherapy. He was discharged ?on 12/27 to a skilled nursing facility. He has been using wet-to-dry dressings with Santyl to the sacrum and Betadine to the right heel wound. He currently ?denies systemic signs of infection. He has Prevalon boots and a group 2 air mattress. He has a hard  time offloading the sacrum and is on his back most of ?the day. ?2/2; patient presents for follow-up. Santyl has been used to the right heel wound and Dakin's wet-to-dry dressings to the sacral wound. He states he had ?imaging done to both wound bed areas. He has no issues or complaints today. He denies signs of infection. ?2/16; patient presents for follow-up. He is using Dakin's to the sacral wound and Santyl to the left heel wound. He has no issues or complaints today. He ?denies signs of infection. Unfortunately he is unable to offload the sacral wound bed. He either sits in the bed all day or he is in a wheelchair. ?3/2; patient presents for follow-up. He has been using Dakin's wet-to-dry dressing to the sacral wound and Hydrofera Blue to the left heel wound. He has no ?issues or complaints today. He denies signs of infection. ?3/23; patient presents for follow-up. He continues to use Dakin's wet-to-dry dressings to the sacral wound and Hydrofera Blue to the heel wound. The heel ?wound is closed. Unfortunately we have never been able to get the x-ray results of the sacrum ordered several clinic visits ago. ?4/6; patient presents for follow-up. He has been using Dakin's wet-to-dry dressings to the sacral wound. He obtained his x-ray of the sacrum. He has no issues ?or complaints today. ?Electronic Signature(s) ?Signed: 03/25/2022 2:10:58 PM By: [name] DO ?Entered By: [name] on 03/25/2022 14:03:55 ?-------------------------------------------------------------------------------- ?Physical Exam Details ?Patient Name: Date of Service: ?[name], [name] 03/25/2022 12:30 PM ?Medical Record Number: [id number] ?Patient Account Number: [id number] ?Date of Birth/Sex: Treating RN: ?May 18, 1930 (88 y.o. [name] ?Primary Care Provider: [name] Other Clinician: ?Referring Provider: ?Treating Provider/Extender: [name] ?[name] ?Weeks in Treatment: 11 ?Constitutional ?respirations  regular, non-labored and within target range for patient.Marland Kitchen ?Psychiatric ?pleasant and cooperative. ?Notes ?T the sacrum there  is an open wound with granulation tissue and undermining. No surrounding signs of infection. ?o ?Electronic Signature(s) ?Signed: 03/25/2022 2:10:58 PM By: [name] DO ?Entered By: [name] on 03/25/2022 14:05:27 ?-------------------------------------------------------------------------------- ?Physician Orders Details ?Patient Name: Date of Service: ?[name], [name] 03/25/2022 12:30 PM ?Medical Record Number: [id number] ?Patient Account Number: [id number] ?Date of Birth/Sex: Treating RN: ?06-Dec-1930 (88 y.o. [name] ?Primary Care Provider: [name] Other Clinician: ?Referring Provider: ?Treating Provider/Extender: [name] ?[name] ?Weeks in Treatment: 11 ?Verbal / Phone Orders: No ?Diagnosis Coding ?Follow-up Appointments ?ppointment in 2 weeks. - Dr Heber Patterson [facility], Room7) ?Return A ?Bathing/ Shower/ Hygiene ?Other Bathing/Shower/Hygiene Orders/Instructions: - May have a sponge bath or shower ?Negative Presssure Wound Therapy ?Other: - Will consider wound vac once receive Xray results ?Edema Control - Lymphedema / SCD / Other ?Right Lower Extremity ?Elevate legs to the level of the heart or above for 30 minutes daily and/or when sitting, a frequency of: - Throughout the day ?Moisturize legs daily. ?Off-Loading ?Heel suspension boot to: - Prevalon Boots to bilateral feet ?Low air-loss mattress (Group 2) ?Gel wheelchair cushion ?Turn and reposition every 2 hours ?Additional Orders / Instructions ?Follow Nutritious Diet - High protein for wound healing ?Non Wound Condition ?Protect area with: - Apply Allevyn foam border dressing to right heal to protect newly healed area. ?Wound Treatment ?Wound #2 - Sacrum ?Peri-Wound Care: Zinc Oxide Ointment 30g tube 2 x Per Day/15 Days ?Discharge Instructions: Apply Zinc Oxide to periwound with each dressing  change ?Prim Dressing: Dakin's Solution 0.125%, 16 (oz) 2 x Per Day/15 Days ?ary ?Discharge Instructions: Moisten gauze with Dakin's solution ?Secondary Dressing: MPM [name] SAP Bordered Dressing, 7x6.7 (Sacral) (in/in) 2 x Per Day/15 Days ?Discharge Instructions: Apply silicone border over primary dressing as directed. ?Electronic Signature(s) ?Signed: 03/25/2022 2:10:58 PM By: [name] DO ?Entered By: [name] on 03/25/2022 14:05:50 ?-------------------------------------------------------------------------------- ?Problem List Details ?Patient Name: ?Date of Service: ?[name], [name] 03/25/2022 12:30 PM ?Medical Record Number: [id number] ?Patient Account Number: [id number] ?Date of Birth/Sex: ?Treating RN: ?Jan 21, 1930 (88 y.o. [name] ?Primary Care Provider: [name] ?Other Clinician: ?Referring Provider: ?Treating Provider/Extender: [name] ?[name] ?Weeks in Treatment: 11 ?Active Problems ?ICD-10 ?Encounter ?Code Description Active Date MDM ?Diagnosis ?L89.154 Pressure ulcer of sacral region, stage 4 01/07/2022 No Yes ?L89.610 Pressure ulcer of right heel, unstageable 01/07/2022 No Yes ?E03.9 Hypothyroidism, unspecified 01/07/2022 No Yes ?Inactive Problems ?Resolved Problems ?Electronic Signature(s) ?Signed: 03/25/2022 2:10:58 PM By: [name] DO ?Entered By: [name] on 03/25/2022 14:03:00 ?-------------------------------------------------------------------------------- ?Progress Note Details ?Patient Name: ?Date of Service: ?[name], [name] 03/25/2022 12:30 PM ?Medical Record Number: [id number] ?Patient Account Number: [id number] ?Date of Birth/Sex: ?Treating RN: ?Apr 28, 1930 (88 y.o. [name] ?Primary Care Provider: [name] ?Other Clinician: ?Referring Provider: ?Treating Provider/Extender: [name] ?[name] ?Weeks in Treatment: 11 ?Subjective ?Chief Complaint ?Information obtained from Patient ?Sacral and left heel pressure  ulcers ?History of Present Illness (HPI) ?Admission 01/07/2022 ?Mr. Clemons Malecha is an 88 year old male with a past medical history of prostate cancer, hypothyroidism and celiac disease that presents to the clinic for a

## 2022-03-25 NOTE — Discharge Instructions (Signed)
You are seen in the emergency department for evaluation of a syncopal event at your wound clinic.  You had lab work EKG and were given IV fluids with improvement in your symptoms.  Please try to stay well-hydrated.  Continue your regular medications.  Follow-up with your doctor.  Return to the emergency department if any worsening or concerning symptoms. ?

## 2022-04-02 DIAGNOSIS — L989 Disorder of the skin and subcutaneous tissue, unspecified: Secondary | ICD-10-CM | POA: Diagnosis not present

## 2022-04-08 ENCOUNTER — Encounter (HOSPITAL_BASED_OUTPATIENT_CLINIC_OR_DEPARTMENT_OTHER): Payer: Medicare Other | Admitting: Internal Medicine

## 2022-04-08 DIAGNOSIS — Z8546 Personal history of malignant neoplasm of prostate: Secondary | ICD-10-CM | POA: Diagnosis not present

## 2022-04-08 DIAGNOSIS — L89154 Pressure ulcer of sacral region, stage 4: Secondary | ICD-10-CM | POA: Diagnosis not present

## 2022-04-08 DIAGNOSIS — E039 Hypothyroidism, unspecified: Secondary | ICD-10-CM

## 2022-04-08 DIAGNOSIS — L89610 Pressure ulcer of right heel, unstageable: Secondary | ICD-10-CM | POA: Diagnosis not present

## 2022-04-08 DIAGNOSIS — K900 Celiac disease: Secondary | ICD-10-CM | POA: Diagnosis not present

## 2022-04-08 DIAGNOSIS — M6282 Rhabdomyolysis: Secondary | ICD-10-CM | POA: Diagnosis not present

## 2022-04-08 NOTE — Progress Notes (Signed)
[name], [name] ([id number]) ?Visit Report for 04/08/2022 ?Chief Complaint Document Details ?Patient Name: Date of Service: ?[name], [name] 04/08/2022 9:30 A M ?Medical Record Number: [id number] ?Patient Account Number: [id number] ?Date of Birth/Sex: Treating RN: ?06/27/1930 (88 y.o. [name] ?Primary Care Provider: [name] Other Clinician: ?Referring Provider: ?Treating Provider/Extender: [name] ?[name] ?Weeks in Treatment: 13 ?Information Obtained from: Patient ?Chief Complaint ?Sacral and left heel pressure ulcers ?Electronic Signature(s) ?Signed: 04/08/2022 1:32:24 PM By: [name] DO ?Entered By: [name] on 04/08/2022 11:30:36 ?-------------------------------------------------------------------------------- ?HPI Details ?Patient Name: Date of Service: ?[name], [name] 04/08/2022 9:30 A M ?Medical Record Number: [id number] ?Patient Account Number: [id number] ?Date of Birth/Sex: Treating RN: ?02-20-1930 (88 y.o. [name] ?Primary Care Provider: [name] Other Clinician: ?Referring Provider: ?Treating Provider/Extender: [name] ?[name] ?Weeks in Treatment: 13 ?History of Present Illness ?HPI Description: Admission 01/07/2022 ?Mr. Clemons Beeks is an 88 year old male with a past medical history of prostate cancer, hypothyroidism and celiac disease that presents to the clinic for a 1 ?month history of ulcers to his sacrum and right heel. He was admitted to the hospital on 12/05/2021 for rhabdomyolysis secondary to being on the floor for 2 ?days without being able to get up. He developed a pressure ulcer to his sacrum and right heel. In the hospital they were doing hydrotherapy. He was discharged ?on 12/27 to a skilled nursing facility. He has been using wet-to-dry dressings with Santyl to the sacrum and Betadine to the right heel wound. He currently ?denies systemic signs of infection. He has Prevalon boots and a group 2 air mattress. He has a  hard time offloading the sacrum and is on his back most of ?the day. ?2/2; patient presents for follow-up. Santyl has been used to the right heel wound and Dakin's wet-to-dry dressings to the sacral wound. He states he had ?imaging done to both wound bed areas. He has no issues or complaints today. He denies signs of infection. ?2/16; patient presents for follow-up. He is using Dakin's to the sacral wound and Santyl to the left heel wound. He has no issues or complaints today. He ?denies signs of infection. Unfortunately he is unable to offload the sacral wound bed. He either sits in the bed all day or he is in a wheelchair. ?3/2; patient presents for follow-up. He has been using Dakin's wet-to-dry dressing to the sacral wound and Hydrofera Blue to the left heel wound. He has no ?issues or complaints today. He denies signs of infection. ?3/23; patient presents for follow-up. He continues to use Dakin's wet-to-dry dressings to the sacral wound and Hydrofera Blue to the heel wound. The heel ?wound is closed. Unfortunately we have never been able to get the x-ray results of the sacrum ordered several clinic visits ago. ?4/6; patient presents for follow-up. He has been using Dakin's wet-to-dry dressings to the sacral wound. He obtained his x-ray of the sacrum. He has no issues ?or complaints today. ?4/20; patient presents for follow-up. He continues to use Dakin's wet-to-dry dressings to the sacral wound. We have not received the x-ray results. He has no ?issues or complaints today. He denies signs of infection. ?Electronic Signature(s) ?Signed: 04/08/2022 1:32:24 PM By: [name] DO ?Signed: 04/08/2022 1:32:24 PM By: [name] DO ?Entered By: [name] on 04/08/2022 11:31:22 ?-------------------------------------------------------------------------------- ?Physical Exam Details ?Patient Name: Date of Service: ?[name], [name] 04/08/2022 9:30 A M ?Medical Record Number: [id number] ?Patient Account  Number: [id number] ?Date of Birth/Sex:  Treating RN: ?1930/01/10 (88 y.o. [name] ?Primary Care Provider: [name] Other Clinician: ?Referring Provider: ?Treating Provider/Extender: [name] ?[name] ?Weeks in Treatment: 13 ?Constitutional ?respirations regular, non-labored and within target range for patient.Marland Kitchen ?Psychiatric ?pleasant and cooperative. ?Notes ?T the sacrum there is an open wound with granulation tissue and undermining. No surrounding signs of infection. ?o ?Electronic Signature(s) ?Signed: 04/08/2022 1:32:24 PM By: [name] DO ?Entered By: [name] on 04/08/2022 11:31:54 ?-------------------------------------------------------------------------------- ?Physician Orders Details ?Patient Name: Date of Service: ?[name], [name] 04/08/2022 9:30 A M ?Medical Record Number: [id number] ?Patient Account Number: [id number] ?Date of Birth/Sex: Treating RN: ?1930-10-14 (88 y.o. [name] ?Primary Care Provider: [name] Other Clinician: ?Referring Provider: ?Treating Provider/Extender: [name] ?[name] ?Weeks in Treatment: 13 ?Verbal / Phone Orders: No ?Diagnosis Coding ?ICD-10 Coding ?Code Description ?L89.154 Pressure ulcer of sacral region, stage 4 ?L89.610 Pressure ulcer of right heel, unstageable ?E03.9 Hypothyroidism, unspecified ?Follow-up Appointments ?ppointment in 2 weeks. - Dr Heber [facility], Room7) ?Return A ?Other: - Please fax most recent Xray results to [phone]. Waiting on results to decide on using wound vac ?Bathing/ Shower/ Hygiene ?Other Bathing/Shower/Hygiene Orders/Instructions: - May have a sponge bath or shower ?Negative Presssure Wound Therapy ?Other: - Will consider wound vac once receive Xray results ?Edema Control - Lymphedema / SCD / Other ?Right Lower Extremity ?Elevate legs to the level of the heart or above for 30 minutes daily and/or when sitting, a frequency of: - Throughout the day ?Moisturize legs  daily. ?Off-Loading ?Heel suspension boot to: - Prevalon Boots to bilateral feet ?Low air-loss mattress (Group 2) ?Gel wheelchair cushion ?Turn and reposition every 2 hours ?Additional Orders / Instructions ?Follow Nutritious Diet - High protein for wound healing ?Non Wound Condition ?Protect area with: - Apply Allevyn foam border dressing to right heal to protect newly healed area. ?Wound Treatment ?Wound #2 - Sacrum ?Peri-Wound Care: Zinc Oxide Ointment 30g tube 2 x Per Day/15 Days ?Discharge Instructions: Apply Zinc Oxide to periwound with each dressing change ?Prim Dressing: Dakin's Solution 0.125%, 16 (oz) 2 x Per Day/15 Days ?ary ?Discharge Instructions: Moisten gauze with Dakin's solution ?Secondary Dressing: MPM [name] SAP Bordered Dressing, 7x6.7 (Sacral) (in/in) 2 x Per Day/15 Days ?Discharge Instructions: Apply silicone border over primary dressing as directed. ?Electronic Signature(s) ?Signed: 04/08/2022 1:32:24 PM By: [name] DO ?Entered By: [name] on 04/08/2022 12:16:51 ?-------------------------------------------------------------------------------- ?Problem List Details ?Patient Name: ?Date of Service: ?[name], [name] 04/08/2022 9:30 A M ?Medical Record Number: [id number] ?Patient Account Number: [id number] ?Date of Birth/Sex: ?Treating RN: ?11/14/1930 (88 y.o. [name] ?Primary Care Provider: [name] ?Other Clinician: ?Referring Provider: ?Treating Provider/Extender: [name] ?[name] ?Weeks in Treatment: 13 ?Active Problems ?ICD-10 ?Encounter ?Code Description Active Date MDM ?Diagnosis ?L89.154 Pressure ulcer of sacral region, stage 4 01/07/2022 No Yes ?E03.9 Hypothyroidism, unspecified 01/07/2022 No Yes ?Inactive Problems ?Resolved Problems ?ICD-10 ?Code Description Active Date Resolved Date ?L89.610 Pressure ulcer of right heel, unstageable 01/07/2022 01/07/2022 ?Electronic Signature(s) ?Signed: 04/08/2022 1:32:24 PM By: [name] DO ?Entered By:  [name] on 04/08/2022 11:29:21 ?-------------------------------------------------------------------------------- ?Progress Note Details ?Patient Name: Date of Service: ?[name], [name] 04/08/2022 9:30 A M

## 2022-04-08 NOTE — Progress Notes (Signed)
[name], [name] ([id number]) ?Visit Report for 04/08/2022 ?Arrival Information Details ?Patient Name: Date of Service: ?[name], [name] 04/08/2022 9:30 A M ?Medical Record Number: [id number] ?Patient Account Number: [id number] ?Date of Birth/Sex: Treating RN: ?[birth year] (88 y.o. [name] ?Primary Care [name]: [name] Other Clinician: ?Referring [name]: ?Treating [name]/Extender: [name] ?[name] ?Weeks in Treatment: 13 ?Visit Information History Since Last Visit ?Added or deleted any medications: No ?Patient Arrived: Wheel Chair ?Any new allergies or adverse reactions: No ?Arrival Time: 10:32 ?Had a fall or experienced change in No ?Accompanied By: son ?activities of daily living that may affect ?Transfer Assistance: Manual ?risk of falls: ?Patient Identification Verified: Yes ?Signs or symptoms of abuse/neglect since last visito No ?Secondary Verification Process Completed: Yes ?Hospitalized since last visit: No ?Patient Requires Transmission-Based Precautions: No ?Implantable device outside of the clinic excluding No ?Patient Has Alerts: Yes ?cellular tissue based products placed in the center ?Patient Alerts: Patient on Blood Thinner since last visit: ?R ABI non compressible Has Dressing in Place as Prescribed: Yes ?Pain Present Now: No ?Electronic Signature(s) ?Signed: 04/08/2022 4:55:52 PM By: [name] ?Entered By: [name] on 04/08/2022 10:32:31 ?-------------------------------------------------------------------------------- ?Clinic Level of Care Assessment Details ?Patient Name: Date of Service: ?[name], [name] 04/08/2022 9:30 A M ?Medical Record Number: [id number] ?Patient Account Number: [id number] ?Date of Birth/Sex: Treating RN: ?1930-01-18 (88 y.o. [name] ?Primary Care [name]: [name] Other Clinician: ?Referring [name]: ?Treating [name]/Extender: [name] ?[name] ?Weeks in Treatment: 13 ?Clinic Level of Care Assessment  Items ?TOOL 4 Quantity Score ?X- 1 0 ?Use when only an EandM is performed on FOLLOW-UP visit ?ASSESSMENTS - Nursing Assessment / Reassessment ?X- 1 10 ?Reassessment of Co-morbidities (includes updates in patient status) ?X- 1 5 ?Reassessment of Adherence to Treatment Plan ?ASSESSMENTS - Wound and Skin A ssessment / Reassessment ?X - Simple Wound Assessment / Reassessment - one wound 1 5 ?[]  - 0 ?Complex Wound Assessment / Reassessment - multiple wounds ?[]  - 0 ?Dermatologic / Skin Assessment (not related to wound area) ?ASSESSMENTS - Focused Assessment ?[]  - 0 ?Circumferential Edema Measurements - multi extremities ?[]  - 0 ?Nutritional Assessment / Counseling / Intervention ?[]  - 0 ?Lower Extremity Assessment (monofilament, tuning fork, pulses) ?[]  - 0 ?Peripheral Arterial Disease Assessment (using hand held doppler) ?ASSESSMENTS - Ostomy and/or Continence Assessment and Care ?[]  - 0 ?Incontinence Assessment and Management ?[]  - 0 ?Ostomy Care Assessment and Management (repouching, etc.) ?PROCESS - Coordination of Care ?[]  - 0 ?Simple Patient / Family Education for ongoing care ?X- 1 20 ?Complex (extensive) Patient / Family Education for ongoing care ?X- 1 10 ?Staff obtains Consents, Records, T Results / Process Orders ?est ?X- 1 10 ?Staff telephones HHA, Nursing Homes / Clarify orders / etc ?[]  - 0 ?Routine Transfer to another Facility (non-emergent condition) ?[]  - 0 ?Routine Hospital Admission (non-emergent condition) ?[]  - 0 ?New Admissions / Biomedical engineer / Ordering NPWT Apligraf, etc. ?, ?[]  - 0 ?Emergency Hospital Admission (emergent condition) ?[]  - 0 ?Simple Discharge Coordination ?[]  - 0 ?Complex (extensive) Discharge Coordination ?PROCESS - Special Needs ?[]  - 0 ?Pediatric / Minor Patient Management ?[]  - 0 ?Isolation Patient Management ?[]  - 0 ?Hearing / Language / Visual special needs ?[]  - 0 ?Assessment of Community assistance (transportation, D/C planning, etc.) ?[]  - 0 ?Additional  assistance / Altered mentation ?[]  - 0 ?Support Surface(s) Assessment (bed, cushion, seat, etc.) ?INTERVENTIONS - Wound Cleansing / Measurement ?X - Simple Wound Cleansing - one wound 1  5 ?[]  - 0 ?Complex Wound Cleansing - multiple wounds ?X- 1 5 ?Wound Imaging (photographs - any number of wounds) ?[]  - 0 ?Wound Tracing (instead of photographs) ?X- 1 5 ?Simple Wound Measurement - one wound ?[]  - 0 ?Complex Wound Measurement - multiple wounds ?INTERVENTIONS - Wound Dressings ?[]  - 0 ?Small Wound Dressing one or multiple wounds ?X- 1 15 ?Medium Wound Dressing one or multiple wounds ?[]  - 0 ?Large Wound Dressing one or multiple wounds ?[]  - 0 ?Application of Medications - topical ?[]  - 0 ?Application of Medications - injection ?INTERVENTIONS - Miscellaneous ?[]  - 0 ?External ear exam ?[]  - 0 ?Specimen Collection (cultures, biopsies, blood, body fluids, etc.) ?[]  - 0 ?Specimen(s) / Culture(s) sent or taken to Lab for analysis ?[]  - 0 ?Patient Transfer (multiple staff / Civil Service fast streamer / Similar devices) ?[]  - 0 ?Simple Staple / Suture removal (25 or less) ?[]  - 0 ?Complex Staple / Suture removal (26 or more) ?[]  - 0 ?Hypo / Hyperglycemic Management (close monitor of Blood Glucose) ?[]  - 0 ?Ankle / Brachial Index (ABI) - do not check if billed separately ?X- 1 5 ?Vital Signs ?Has the patient been seen at the hospital within the last three years: Yes ?Total Score: 95 ?Level Of Care: New/Established - Level 3 ?Electronic Signature(s) ?Signed: 04/08/2022 4:55:52 PM By: [name] ?Entered By: [name] on 04/08/2022 10:58:36 ?-------------------------------------------------------------------------------- ?Encounter Discharge Information Details ?Patient Name: Date of Service: ?[name], [name] 04/08/2022 9:30 A M ?Medical Record Number: [id number] ?Patient Account Number: [id number] ?Date of Birth/Sex: Treating RN: ?August 04, 1930 (88 y.o. [name] ?Primary Care [name]: [name] Other Clinician: ?Referring  Domonik Levario: ?Treating Kaity Pitstick/Extender: [name] ?[name] ?Weeks in Treatment: 13 ?Encounter Discharge Information Items ?Discharge Condition: Stable ?Ambulatory Status: Wheelchair ?Discharge Destination: [place] ?Orders Sent: Yes ?Transportation: Other ?Accompanied By: son ?Schedule Follow-up Appointment: Yes ?Clinical Summary of Care: Provided on 04/08/2022 ?Form Type Recipient ?Paper Patient Patient ?Electronic Signature(s) ?Signed: 04/08/2022 4:55:52 PM By: [name] ?Entered By: [name] on 04/08/2022 10:59:52 ?-------------------------------------------------------------------------------- ?Lower Extremity Assessment Details ?Patient Name: Date of Service: ?[name], [name] 04/08/2022 9:30 A M ?Medical Record Number: [id number] ?Patient Account Number: [id number] ?Date of Birth/Sex: Treating RN: ?1930/12/16 (88 y.o. [name] ?Primary Care Jeury Mcnab: [name] Other Clinician: ?Referring Elazar Argabright: ?Treating Yazlynn Birkeland/Extender: [name] ?[name] ?Weeks in Treatment: 13 ?Electronic Signature(s) ?Signed: 04/08/2022 4:55:52 PM By: [name] ?Entered By: [name] on 04/08/2022 10:33:09 ?-------------------------------------------------------------------------------- ?Multi Wound Chart Details ?Patient Name: ?Date of Service: ?[name], [name] 04/08/2022 9:30 A M ?Medical Record Number: [id number] ?Patient Account Number: [id number] ?Date of Birth/Sex: ?Treating RN: ?08/21/1930 (88 y.o. [name] ?Primary Care Catha Ontko: [name] ?Other Clinician: ?Referring Tosh Glaze: ?Treating Gage Treiber/Extender: [name] ?[name] ?Weeks in Treatment: 13 ?Vital Signs ?Height(in): 73 ?Pulse(bpm): 47 ?Weight(lbs): 145 ?Blood Pressure(mmHg): 74/50 ?Body Mass Index(BMI): 19.1 ?Temperature(??F): 98 ?Respiratory Rate(breaths/min): 18 ?Photos: [N/A:N/A] ?Sacrum N/A N/A ?Wound Location: ?Pressure Injury N/A N/A ?Wounding Event: ?Pressure Ulcer N/A  N/A ?Primary Etiology: ?Cataracts, Arrhythmia N/A N/A ?Comorbid History: ?12/02/2021 N/A N/A ?Date Acquired: ?8 N/A N/A ?Weeks of Treatment: ?Open N/A N/A ?Wound Status: ?No N/A N/A ?Wound Recurrence: ?2.8x2.7x0.7

## 2022-04-12 DIAGNOSIS — F4321 Adjustment disorder with depressed mood: Secondary | ICD-10-CM | POA: Diagnosis not present

## 2022-04-15 DIAGNOSIS — Z7689 Persons encountering health services in other specified circumstances: Secondary | ICD-10-CM | POA: Diagnosis not present

## 2022-04-15 DIAGNOSIS — L89150 Pressure ulcer of sacral region, unstageable: Secondary | ICD-10-CM | POA: Diagnosis not present

## 2022-04-19 DIAGNOSIS — E039 Hypothyroidism, unspecified: Secondary | ICD-10-CM | POA: Diagnosis not present

## 2022-04-19 DIAGNOSIS — Z7689 Persons encountering health services in other specified circumstances: Secondary | ICD-10-CM | POA: Diagnosis not present

## 2022-04-20 DIAGNOSIS — M6281 Muscle weakness (generalized): Secondary | ICD-10-CM | POA: Diagnosis not present

## 2022-04-20 DIAGNOSIS — Z9181 History of falling: Secondary | ICD-10-CM | POA: Diagnosis not present

## 2022-04-20 DIAGNOSIS — R2689 Other abnormalities of gait and mobility: Secondary | ICD-10-CM | POA: Diagnosis not present

## 2022-04-22 ENCOUNTER — Encounter (HOSPITAL_BASED_OUTPATIENT_CLINIC_OR_DEPARTMENT_OTHER): Payer: Medicare Other | Attending: Internal Medicine | Admitting: Internal Medicine

## 2022-04-22 DIAGNOSIS — L89154 Pressure ulcer of sacral region, stage 4: Secondary | ICD-10-CM

## 2022-04-22 DIAGNOSIS — E039 Hypothyroidism, unspecified: Secondary | ICD-10-CM

## 2022-04-22 DIAGNOSIS — N182 Chronic kidney disease, stage 2 (mild): Secondary | ICD-10-CM | POA: Insufficient documentation

## 2022-04-22 DIAGNOSIS — L89610 Pressure ulcer of right heel, unstageable: Secondary | ICD-10-CM | POA: Diagnosis not present

## 2022-04-22 DIAGNOSIS — Z7901 Long term (current) use of anticoagulants: Secondary | ICD-10-CM | POA: Insufficient documentation

## 2022-04-22 DIAGNOSIS — I4891 Unspecified atrial fibrillation: Secondary | ICD-10-CM | POA: Diagnosis not present

## 2022-04-22 NOTE — Progress Notes (Signed)
MAKAI, DUMOND ([id number]) ?Visit Report for 04/22/2022 ?Arrival Information Details ?Patient Name: Date of Service: ?[name], [name] 04/22/2022 12:30 PM ?Medical Record Number: [id number] ?Patient Account Number: [id number] ?Date of Birth/Sex: Treating RN: ?[birth year] (88 y.o. [name] ?Primary Care [name]: [name] Other Clinician: ?Referring [name]: ?Treating [name]/Extender: [name] ?[name] ?Weeks in Treatment: 15 ?Visit Information History Since Last Visit ?Added or deleted any medications: No ?Patient Arrived: Wheel Chair ?Any new allergies or adverse reactions: No ?Arrival Time: 12:47 ?Had a fall or experienced change in No ?Accompanied By: son ?activities of daily living that may affect ?Transfer Assistance: Manual ?risk of falls: ?Patient Identification Verified: Yes ?Signs or symptoms of abuse/neglect since last visito No ?Secondary Verification Process Completed: Yes ?Hospitalized since last visit: No ?Patient Requires Transmission-Based Precautions: No ?Implantable device outside of the clinic excluding No ?Patient Has Alerts: Yes ?cellular tissue based products placed in the center ?Patient Alerts: Patient on Blood Thinner since last visit: ?R ABI non compressible Has Dressing in Place as Prescribed: Yes ?Pain Present Now: No ?Electronic Signature(s) ?Signed: 04/22/2022 4:49:55 PM By: [name] ?Entered By: [name] on 04/22/2022 12:54:07 ?-------------------------------------------------------------------------------- ?Clinic Level of Care Assessment Details ?Patient Name: Date of Service: ?[name], [name] 04/22/2022 12:30 PM ?Medical Record Number: [id number] ?Patient Account Number: [id number] ?Date of Birth/Sex: Treating RN: ?1930/06/06 (88 y.o. [name] ?Primary Care [name]: [name] Other Clinician: ?Referring [name]: ?Treating [name]/Extender: [name] ?[name] ?Weeks in Treatment: 15 ?Clinic Level of Care Assessment  Items ?TOOL 4 Quantity Score ?X- 1 0 ?Use when only an EandM is performed on FOLLOW-UP visit ?ASSESSMENTS - Nursing Assessment / Reassessment ?X- 1 10 ?Reassessment of Co-morbidities (includes updates in patient status) ?X- 1 5 ?Reassessment of Adherence to Treatment Plan ?ASSESSMENTS - Wound and Skin A ssessment / Reassessment ?X - Simple Wound Assessment / Reassessment - one wound 1 5 ?[]  - 0 ?Complex Wound Assessment / Reassessment - multiple wounds ?[]  - 0 ?Dermatologic / Skin Assessment (not related to wound area) ?ASSESSMENTS - Focused Assessment ?[]  - 0 ?Circumferential Edema Measurements - multi extremities ?[]  - 0 ?Nutritional Assessment / Counseling / Intervention ?[]  - 0 ?Lower Extremity Assessment (monofilament, tuning fork, pulses) ?[]  - 0 ?Peripheral Arterial Disease Assessment (using hand held doppler) ?ASSESSMENTS - Ostomy and/or Continence Assessment and Care ?[]  - 0 ?Incontinence Assessment and Management ?[]  - 0 ?Ostomy Care Assessment and Management (repouching, etc.) ?PROCESS - Coordination of Care ?[]  - 0 ?Simple Patient / Family Education for ongoing care ?X- 1 20 ?Complex (extensive) Patient / Family Education for ongoing care ?[]  - 0 ?Staff obtains Consents, Records, T Results / Process Orders ?est ?X- 1 10 ?Staff telephones HHA, Nursing Homes / Clarify orders / etc ?[]  - 0 ?Routine Transfer to another Facility (non-emergent condition) ?[]  - 0 ?Routine Hospital Admission (non-emergent condition) ?[]  - 0 ?New Admissions / Biomedical engineer / Ordering NPWT Apligraf, etc. ?, ?[]  - 0 ?Emergency Hospital Admission (emergent condition) ?[]  - 0 ?Simple Discharge Coordination ?[]  - 0 ?Complex (extensive) Discharge Coordination ?PROCESS - Special Needs ?[]  - 0 ?Pediatric / Minor Patient Management ?[]  - 0 ?Isolation Patient Management ?[]  - 0 ?Hearing / Language / Visual special needs ?[]  - 0 ?Assessment of Community assistance (transportation, D/C planning, etc.) ?[]  - 0 ?Additional  assistance / Altered mentation ?[]  - 0 ?Support Surface(s) Assessment (bed, cushion, seat, etc.) ?INTERVENTIONS - Wound Cleansing / Measurement ?X - Simple Wound Cleansing - one wound 1 5 ?[]  -  0 ?Complex Wound Cleansing - multiple wounds ?X- 1 5 ?Wound Imaging (photographs - any number of wounds) ?[]  - 0 ?Wound Tracing (instead of photographs) ?X- 1 5 ?Simple Wound Measurement - one wound ?[]  - 0 ?Complex Wound Measurement - multiple wounds ?INTERVENTIONS - Wound Dressings ?[]  - 0 ?Small Wound Dressing one or multiple wounds ?[]  - 0 ?Medium Wound Dressing one or multiple wounds ?[]  - 0 ?Large Wound Dressing one or multiple wounds ?[]  - 0 ?Application of Medications - topical ?[]  - 0 ?Application of Medications - injection ?INTERVENTIONS - Miscellaneous ?[]  - 0 ?External ear exam ?[]  - 0 ?Specimen Collection (cultures, biopsies, blood, body fluids, etc.) ?[]  - 0 ?Specimen(s) / Culture(s) sent or taken to Lab for analysis ?[]  - 0 ?Patient Transfer (multiple staff / Civil Service fast streamer / Similar devices) ?[]  - 0 ?Simple Staple / Suture removal (25 or less) ?[]  - 0 ?Complex Staple / Suture removal (26 or more) ?[]  - 0 ?Hypo / Hyperglycemic Management (close monitor of Blood Glucose) ?[]  - 0 ?Ankle / Brachial Index (ABI) - do not check if billed separately ?X- 1 5 ?Vital Signs ?Has the patient been seen at the hospital within the last three years: Yes ?Total Score: 70 ?Level Of Care: New/Established - Level 2 ?Electronic Signature(s) ?Signed: 04/22/2022 4:49:55 PM By: [name] ?Entered By: [name] on 04/22/2022 13:26:42 ?-------------------------------------------------------------------------------- ?Encounter Discharge Information Details ?Patient Name: Date of Service: ?[name], [name] 04/22/2022 12:30 PM ?Medical Record Number: [id number] ?Patient Account Number: [id number] ?Date of Birth/Sex: Treating RN: ?[birth year] (88 y.o. [name] ?Primary Care Sumayyah Custodio: [name] Other Clinician: ?Referring  [name] Egelhoff: ?Treating Kimsey Demaree/Extender: [name] ?[name] ?Weeks in Treatment: 15 ?Encounter Discharge Information Items ?Discharge Condition: Stable ?Ambulatory Status: Wheelchair ?Discharge Destination: [place] ?Orders Sent: Yes ?Transportation: Other ?Accompanied By: Son ?Schedule Follow-up Appointment: Yes ?Clinical Summary of Care: Provided on 04/22/2022 ?Form Type Recipient ?Paper Patient Patient ?Electronic Signature(s) ?Signed: 04/22/2022 3:09:46 PM By: [name] ?Entered By: [name] on 04/22/2022 15:09:45 ?-------------------------------------------------------------------------------- ?Lower Extremity Assessment Details ?Patient Name: Date of Service: ?[name], [name] 04/22/2022 12:30 PM ?Medical Record Number: [id number] ?Patient Account Number: [id number] ?Date of Birth/Sex: Treating RN: ?12-Jun-1930 (88 y.o. [name] ?Primary Care Edmonia Gonser: [name] Other Clinician: ?Referring Camri Molloy: ?Treating Rubi Tooley/Extender: [name] ?[name] ?Weeks in Treatment: 15 ?Electronic Signature(s) ?Signed: 04/22/2022 4:49:55 PM By: [name] ?Entered By: [name] on 04/22/2022 12:56:43 ?-------------------------------------------------------------------------------- ?Multi Wound Chart Details ?Patient Name: ?Date of Service: ?[name], [name] 04/22/2022 12:30 PM ?Medical Record Number: [id number] ?Patient Account Number: [id number] ?Date of Birth/Sex: ?Treating RN: ?1930-11-13 (88 y.o. [name] ?Primary Care Avontae Burkhead: [name] ?Other Clinician: ?Referring Ammi Hutt: ?Treating Varnell Donate/Extender: [name] ?[name] ?Weeks in Treatment: 15 ?Vital Signs ?Height(in): 73 ?Pulse(bpm): 57 ?Weight(lbs): 145 ?Blood Pressure(mmHg): 102/59 ?Body Mass Index(BMI): 19.1 ?Temperature(??F): 97.6 ?Respiratory Rate(breaths/min): 18 ?Photos: [N/A:N/A] ?Sacrum N/A N/A ?Wound Location: ?Pressure Injury N/A N/A ?Wounding Event: ?Pressure Ulcer N/A  N/A ?Primary Etiology: ?Cataracts, Arrhythmia N/A N/A ?Comorbid History: ?12/02/2021 N/A N/A ?Date Acquired: ?76 N/A N/A ?Weeks of Treatment: ?Open N/A N/A ?Wound Status: ?No N/A N/A ?Wound Recurrence: ?3.4x2.5x0.7 N/A N/A ?M

## 2022-04-22 NOTE — Progress Notes (Signed)
[name], [name] ([id number]) ?Visit Report for 04/22/2022 ?Chief Complaint Document Details ?Patient Name: Date of Service: ?[name], [name] 04/22/2022 12:30 PM ?Medical Record Number: [id number] ?Patient Account Number: [id number] ?Date of Birth/Sex: Treating RN: ?21-Mar-1930 (88 y.o. [name] ?Primary Care Provider: [name] Other Clinician: ?Referring Provider: ?Treating Provider/Extender: [name] ?[name] ?Weeks in Treatment: 15 ?Information Obtained from: Patient ?Chief Complaint ?Sacral and left heel pressure ulcers ?Electronic Signature(s) ?Signed: 04/22/2022 1:57:04 PM By: [name] DO ?Entered By: [name] on 04/22/2022 13:44:46 ?-------------------------------------------------------------------------------- ?HPI Details ?Patient Name: Date of Service: ?[name], [name] 04/22/2022 12:30 PM ?Medical Record Number: [id number] ?Patient Account Number: [id number] ?Date of Birth/Sex: Treating RN: ?1930/01/15 (88 y.o. [name] ?Primary Care Provider: [name] Other Clinician: ?Referring Provider: ?Treating Provider/Extender: [name] ?[name] ?Weeks in Treatment: 15 ?History of Present Illness ?HPI Description: Admission 01/07/2022 ?[name] is an 89 year old male with a past medical history of prostate cancer, hypothyroidism and celiac disease that presents to the clinic for a 1 ?month history of ulcers to his sacrum and right heel. He was admitted to the hospital on 12/05/2021 for rhabdomyolysis secondary to being on the floor for 2 ?days without being able to get up. He developed a pressure ulcer to his sacrum and right heel. In the hospital they were doing hydrotherapy. He was discharged ?on 12/27 to a skilled nursing facility. He has been using wet-to-dry dressings with Santyl to the sacrum and Betadine to the right heel wound. He currently ?denies systemic signs of infection. He has Prevalon boots and a group 2 air mattress. He has a hard  time offloading the sacrum and is on his back most of ?the day. ?2/2; patient presents for follow-up. Santyl has been used to the right heel wound and Dakin's wet-to-dry dressings to the sacral wound. He states he had ?imaging done to both wound bed areas. He has no issues or complaints today. He denies signs of infection. ?2/16; patient presents for follow-up. He is using Dakin's to the sacral wound and Santyl to the left heel wound. He has no issues or complaints today. He ?denies signs of infection. Unfortunately he is unable to offload the sacral wound bed. He either sits in the bed all day or he is in a wheelchair. ?3/2; patient presents for follow-up. He has been using Dakin's wet-to-dry dressing to the sacral wound and Hydrofera Blue to the left heel wound. He has no ?issues or complaints today. He denies signs of infection. ?3/23; patient presents for follow-up. He continues to use Dakin's wet-to-dry dressings to the sacral wound and Hydrofera Blue to the heel wound. The heel ?wound is closed. Unfortunately we have never been able to get the x-ray results of the sacrum ordered several clinic visits ago. ?4/6; patient presents for follow-up. He has been using Dakin's wet-to-dry dressings to the sacral wound. He obtained his x-ray of the sacrum. He has no issues ?or complaints today. ?4/20; patient presents for follow-up. He continues to use Dakin's wet-to-dry dressings to the sacral wound. We have not received the x-ray results. He has no ?issues or complaints today. He denies signs of infection. ?5/4; patient presents for follow-up. He continues to use Dakin's wet-to-dry dressings to the sacral wound. Apparently The facility he resides in did another ?Portable x-ray of the sacrum and results showed no acute osseous abnormalities with no evidence of bony destruction to suggest osteomyelitis. Patient ?reports today that he may be moving to [facility]  [place] as he has been approved for a bed for [facility]  living space. This may be his last ?appointment. ?Electronic Signature(s) ?Signed: 04/22/2022 1:57:04 PM By: [name] DO ?Entered By: [name] on 04/22/2022 13:46:37 ?-------------------------------------------------------------------------------- ?Physical Exam Details ?Patient Name: Date of Service: ?[name], [name] 04/22/2022 12:30 PM ?Medical Record Number: [id number] ?Patient Account Number: [id number] ?Date of Birth/Sex: Treating RN: ?1930/12/11 (88 y.o. [name] ?Primary Care Provider: [name] Other Clinician: ?Referring Provider: ?Treating Provider/Extender: [name] ?[name] ?Weeks in Treatment: 15 ?Constitutional ?respirations regular, non-labored and within target range for patient.Marland Kitchen ?Psychiatric ?pleasant and cooperative. ?Notes ?T the sacrum there is an open wound with granulation tissue and undermining. No surrounding signs of infection. ?o ?Electronic Signature(s) ?Signed: 04/22/2022 1:57:04 PM By: [name] DO ?Entered By: [name] on 04/22/2022 13:46:59 ?-------------------------------------------------------------------------------- ?Physician Orders Details ?Patient Name: Date of Service: ?[name], [name] 04/22/2022 12:30 PM ?Medical Record Number: [id number] ?Patient Account Number: [id number] ?Date of Birth/Sex: Treating RN: ?Jan 07, 1930 (88 y.o. [name] ?Primary Care Provider: [name] Other Clinician: ?Referring Provider: ?Treating Provider/Extender: [name] ?[name] ?Weeks in Treatment: 15 ?Verbal / Phone Orders: No ?Diagnosis Coding ?Follow-up Appointments ?ppointment in 2 weeks. - 05/06/22 @ 12:30p Dr. Heber Dayton [facility], Room7) ?Return A ?Other: - We recommend starting a wound vac but will not order at this time but think new facility should consider it. ?Licensed conveyancer ?Other Bathing/Shower/Hygiene Orders/Instructions: - May have a sponge bath or shower ?Edema Control - Lymphedema / SCD / Other ?Right Lower  Extremity ?Elevate legs to the level of the heart or above for 30 minutes daily and/or when sitting, a frequency of: - Throughout the day ?Moisturize legs daily. ?Off-Loading ?Heel suspension boot to: - Prevalon Boots to bilateral feet ?Low air-loss mattress (Group 2) ?Gel wheelchair cushion ?Turn and reposition every 2 hours ?Additional Orders / Instructions ?Follow Nutritious Diet - High protein for wound healing ?Non Wound Condition ?Protect area with: - Apply Allevyn foam border dressing to right heal to protect newly healed area. ?Wound Treatment ?Wound #2 - Sacrum ?Peri-Wound Care: Zinc Oxide Ointment 30g tube 2 x Per Day/15 Days ?Discharge Instructions: Apply Zinc Oxide to periwound with each dressing change ?Prim Dressing: Dakin's Solution 0.125%, 16 (oz) 2 x Per Day/15 Days ?ary ?Discharge Instructions: Moisten gauze with Dakin's solution ?Secondary Dressing: MPM [name] SAP Bordered Dressing, 7x6.7 (Sacral) (in/in) 2 x Per Day/15 Days ?Discharge Instructions: Apply silicone border over primary dressing as directed. ?Electronic Signature(s) ?Signed: 04/22/2022 1:57:04 PM By: [name] DO ?Entered By: [name] on 04/22/2022 13:47:10 ?-------------------------------------------------------------------------------- ?Problem List Details ?Patient Name: ?Date of Service: ?[name], [name] 04/22/2022 12:30 PM ?Medical Record Number: [id number] ?Patient Account Number: [id number] ?Date of Birth/Sex: ?Treating RN: ?1930/08/08 (88 y.o. [name] ?Primary Care Provider: [name] ?Other Clinician: ?Referring Provider: ?Treating Provider/Extender: [name] ?[name] ?Weeks in Treatment: 15 ?Active Problems ?ICD-10 ?Encounter ?Code Description Active Date MDM ?Diagnosis ?L89.154 Pressure ulcer of sacral region, stage 4 01/07/2022 No Yes ?E03.9 Hypothyroidism, unspecified 01/07/2022 No Yes ?Inactive Problems ?Resolved Problems ?ICD-10 ?Code Description Active Date Resolved Date ?L89.610  Pressure ulcer of right heel, unstageable 01/07/2022 01/07/2022 ?Electronic Signature(s) ?Signed: 04/22/2022 1:57:04 PM By: [name] DO ?Entered By: [name] on 04/22/2022 13:44:26 ?------

## 2022-04-26 DIAGNOSIS — L602 Onychogryphosis: Secondary | ICD-10-CM | POA: Diagnosis not present

## 2022-05-06 ENCOUNTER — Encounter (HOSPITAL_BASED_OUTPATIENT_CLINIC_OR_DEPARTMENT_OTHER): Payer: Medicare Other | Admitting: Internal Medicine

## 2022-05-10 ENCOUNTER — Encounter (HOSPITAL_BASED_OUTPATIENT_CLINIC_OR_DEPARTMENT_OTHER): Payer: Medicare Other | Admitting: Internal Medicine

## 2022-05-10 DIAGNOSIS — L89154 Pressure ulcer of sacral region, stage 4: Secondary | ICD-10-CM

## 2022-05-10 DIAGNOSIS — F4321 Adjustment disorder with depressed mood: Secondary | ICD-10-CM | POA: Diagnosis not present

## 2022-05-10 DIAGNOSIS — I4891 Unspecified atrial fibrillation: Secondary | ICD-10-CM | POA: Diagnosis not present

## 2022-05-10 DIAGNOSIS — N182 Chronic kidney disease, stage 2 (mild): Secondary | ICD-10-CM | POA: Diagnosis not present

## 2022-05-10 DIAGNOSIS — E039 Hypothyroidism, unspecified: Secondary | ICD-10-CM | POA: Diagnosis not present

## 2022-05-10 DIAGNOSIS — Z7901 Long term (current) use of anticoagulants: Secondary | ICD-10-CM | POA: Diagnosis not present

## 2022-05-10 NOTE — Progress Notes (Signed)
Jerry, [name] ([id number]) Visit Report for 05/10/2022 Arrival Information Details Patient Name: Date of Service: [name], [name] 05/10/2022 9:30 A M Medical Record Number: [id number] Patient Account Number: [id number] Date of Birth/Sex: Treating RN: Oct 09, 1930 (88 y.o. [name] Primary Care Jonas Goh: [name] Other Clinician: Referring Oiva Dibari: Treating Lucan Riner/Extender: [name] in Treatment: 42 Visit Information History Since Last Visit Added or deleted any medications: No Patient Arrived: Wheel Chair Any new allergies or adverse reactions: No Arrival Time: 09:29 Had a fall or experienced change in No Accompanied By: son activities of daily living that may affect Transfer Assistance: Manual risk of falls: Patient Identification Verified: Yes Signs or symptoms of abuse/neglect since last visito No Secondary Verification Process Completed: Yes Hospitalized since last visit: No Patient Requires Transmission-Based Precautions: No Implantable device outside of the clinic excluding No Patient Has Alerts: Yes cellular tissue based products placed in the center Patient Alerts: Patient on Blood Thinner since last visit: R ABI non compressible Has Dressing in Place as Prescribed: Yes Pain Present Now: No Electronic Signature(s) Signed: 05/10/2022 4:27:47 PM By: [name] Entered By: [name] on 05/10/2022 09:30:00 -------------------------------------------------------------------------------- Clinic Level of Care Assessment Details Patient Name: Date of Service: [name], Jerry 05/10/2022 9:30 A M Medical Record Number: [id number] Patient Account Number: [id number] Date of Birth/Sex: Treating RN: 1930-09-20 (88 y.o. [name] Primary Care Camerin Ladouceur: [name] Other Clinician: Referring Kavari Parrillo: Treating Elyn Krogh/Extender: [name] in Treatment: 17 Clinic Level of Care Assessment  Items TOOL 4 Quantity Score X- 1 0 Use when only an EandM is performed on FOLLOW-UP visit ASSESSMENTS - Nursing Assessment / Reassessment X- 1 10 Reassessment of Co-morbidities (includes updates in patient status) X- 1 5 Reassessment of Adherence to Treatment Plan ASSESSMENTS - Wound and Skin A ssessment / Reassessment X - Simple Wound Assessment / Reassessment - one wound 1 5 []  - 0 Complex Wound Assessment / Reassessment - multiple wounds []  - 0 Dermatologic / Skin Assessment (not related to wound area) ASSESSMENTS - Focused Assessment []  - 0 Circumferential Edema Measurements - multi extremities []  - 0 Nutritional Assessment / Counseling / Intervention []  - 0 Lower Extremity Assessment (monofilament, tuning fork, pulses) []  - 0 Peripheral Arterial Disease Assessment (using hand held doppler) ASSESSMENTS - Ostomy and/or Continence Assessment and Care []  - 0 Incontinence Assessment and Management []  - 0 Ostomy Care Assessment and Management (repouching, etc.) PROCESS - Coordination of Care []  - 0 Simple Patient / Family Education for ongoing care X- 1 20 Complex (extensive) Patient / Family Education for ongoing care X- 1 10 Staff obtains Programmer, systems, Records, T Results / Process Orders est X- 1 10 Staff telephones HHA, Nursing Homes / Clarify orders / etc []  - 0 Routine Transfer to another Facility (non-emergent condition) []  - 0 Routine Hospital Admission (non-emergent condition) []  - 0 New Admissions / Biomedical engineer / Ordering NPWT Apligraf, etc. , []  - 0 Emergency Hospital Admission (emergent condition) []  - 0 Simple Discharge Coordination []  - 0 Complex (extensive) Discharge Coordination PROCESS - Special Needs []  - 0 Pediatric / Minor Patient Management []  - 0 Isolation Patient Management []  - 0 Hearing / Language / Visual special needs []  - 0 Assessment of Community assistance (transportation, D/C planning, etc.) []  - 0 Additional  assistance / Altered mentation []  - 0 Support Surface(s) Assessment (bed, cushion, seat, etc.) INTERVENTIONS - Wound Cleansing / Measurement X - Simple Wound Cleansing - one wound 1  5 []  - 0 Complex Wound Cleansing - multiple wounds X- 1 5 Wound Imaging (photographs - any number of wounds) []  - 0 Wound Tracing (instead of photographs) X- 1 5 Simple Wound Measurement - one wound []  - 0 Complex Wound Measurement - multiple wounds INTERVENTIONS - Wound Dressings []  - 0 Small Wound Dressing one or multiple wounds X- 1 15 Medium Wound Dressing one or multiple wounds []  - 0 Large Wound Dressing one or multiple wounds []  - 0 Application of Medications - topical []  - 0 Application of Medications - injection INTERVENTIONS - Miscellaneous []  - 0 External ear exam []  - 0 Specimen Collection (cultures, biopsies, blood, body fluids, etc.) []  - 0 Specimen(s) / Culture(s) sent or taken to Lab for analysis []  - 0 Patient Transfer (multiple staff / Civil Service fast streamer / Similar devices) []  - 0 Simple Staple / Suture removal (25 or less) []  - 0 Complex Staple / Suture removal (26 or more) []  - 0 Hypo / Hyperglycemic Management (close monitor of Blood Glucose) []  - 0 Ankle / Brachial Index (ABI) - do not check if billed separately X- 1 5 Vital Signs Has the patient been seen at the hospital within the last three years: Yes Total Score: 95 Level Of Care: New/Established - Level 3 Electronic Signature(s) Signed: 05/10/2022 4:27:47 PM By: [name] Entered By: [name] on 05/10/2022 10:07:19 -------------------------------------------------------------------------------- Encounter Discharge Information Details Patient Name: Date of Service: [name] 05/10/2022 9:30 A M Medical Record Number: [id number] Patient Account Number: [id number] Date of Birth/Sex: Treating RN: 1930/04/09 (88 y.o. [name] Primary Care Hugh Garrow: [name] Other Clinician: Referring  Yony Roulston: Treating Jami Bogdanski/Extender: [name] in Treatment: 17 Encounter Discharge Information Items Discharge Condition: Stable Ambulatory Status: Wheelchair Discharge Destination: Skilled Nursing Facility Orders Sent: Yes Transportation: Other Accompanied By: son Schedule Follow-up Appointment: Yes Clinical Summary of Care: Provided on 05/10/2022 Form Type Recipient Paper Patient Patient Electronic Signature(s) Signed: 05/10/2022 4:27:47 PM By: [name] Entered By: [name] on 05/10/2022 10:17:45 -------------------------------------------------------------------------------- Lower Extremity Assessment Details Patient Name: Date of Service: ROZELL, THEILER 05/10/2022 9:30 A M Medical Record Number: [id number] Patient Account Number: [id number] Date of Birth/Sex: Treating RN: November 07, 1930 (88 y.o. [name] Primary Care Srikar Chiang: [name] Other Clinician: Referring Omarius Grantham: Treating Malaia Buchta/Extender: [name] in Treatment: 17 Electronic Signature(s) Signed: 05/10/2022 4:27:47 PM By: [name] Entered By: [name] on 05/10/2022 09:38:22 -------------------------------------------------------------------------------- Multi Wound Chart Details Patient Name: Date of Service: [name] 05/10/2022 9:30 A M Medical Record Number: [id number] Patient Account Number: [id number] Date of Birth/Sex: Treating RN: 1930-08-25 (88 y.o. [name] Primary Care Adaia Matthies: [name] Other Clinician: Referring Alcee Sipos: Treating Pavel Gadd/Extender: [name] in Treatment: 17 Vital Signs Height(in): 73 Pulse(bpm): 54 Weight(lbs): 145 Blood Pressure(mmHg): 80/47 Body Mass Index(BMI): 19.1 Temperature(F): 97.8 Respiratory Rate(breaths/min): 18 Photos: [N/A:N/A] Sacrum N/A N/A Wound Location: Pressure Injury N/A N/A Wounding Event: Pressure Ulcer N/A  N/A Primary Etiology: Cataracts, Arrhythmia N/A N/A Comorbid History: 12/02/2021 N/A N/A Date Acquired: 17 N/A N/A Weeks of Treatment: Open N/A N/A Wound Status: No N/A N/A Wound Recurrence: 3.4x2x0.7 N/A N/A Measurements L x W x D (cm) 5.341 N/A N/A A (cm) : rea 3.738 N/A N/A Volume (cm) : 89.60% N/A N/A % Reduction in A rea: 96.70% N/A N/A % Reduction in Volume: 6 Position 1 (o'clock): 3 Maximum Distance 1 (cm): 2 Starting Position 1 (o'clock): 5 Ending Position  1 (o'clock): 1.6 Maximum Distance 1 (cm): Yes N/A N/A Tunneling: Yes N/A N/A Undermining: Category/Stage IV N/A N/A Classification: Medium N/A N/A Exudate A mount: Serosanguineous N/A N/A Exudate Type: red, brown N/A N/A Exudate Color: Well defined, not attached N/A N/A Wound Margin: Large (67-100%) N/A N/A Granulation A mount: Red N/A N/A Granulation Quality: None Present (0%) N/A N/A Necrotic A mount: Fat Layer (Subcutaneous Tissue): Yes N/A N/A Exposed Structures: Tendon: Yes Fascia: No Muscle: No Joint: No Bone: No Large (67-100%) N/A N/A Epithelialization: Treatment Notes Electronic Signature(s) Signed: 05/10/2022 10:09:14 AM By: [name] DO Signed: 05/10/2022 4:27:47 PM By: [name] Entered By: [name] on 05/10/2022 10:05:48 -------------------------------------------------------------------------------- Multi-Disciplinary Care Plan Details Patient Name: Date of Service: [name] 05/10/2022 9:30 A M Medical Record Number: [id number] Patient Account Number: [id number] Date of Birth/Sex: Treating RN: 1930-12-18 (88 y.o. [name] Primary Care Renezmae Canlas: [name] Other Clinician: Referring Verdis Koval: Treating Alam Guterrez/Extender: [name] in Treatment: [place] reviewed with physician Active Inactive Pressure Nursing Diagnoses: Knowledge deficit related to causes and risk factors for  pressure ulcer development Knowledge deficit related to management of pressures ulcers Potential for impaired tissue integrity related to pressure, friction, moisture, and shear Goals: Patient/caregiver will verbalize risk factors for pressure ulcer development Date Initiated: 01/07/2022 Target Resolution Date: 05/13/2022 Goal Status: Active Patient/caregiver will verbalize understanding of pressure ulcer management Date Initiated: 01/07/2022 Target Resolution Date: 05/13/2022 Goal Status: Active Interventions: Assess: immobility, friction, shearing, incontinence upon admission and as needed Assess offloading mechanisms upon admission and as needed Assess potential for pressure ulcer upon admission and as needed Provide education on pressure ulcers Notes: 03/11/22: Pressure relief ongoing. Wound/Skin Impairment Nursing Diagnoses: Impaired tissue integrity Knowledge deficit related to ulceration/compromised skin integrity Goals: Patient/caregiver will verbalize understanding of skin care regimen Date Initiated: 01/07/2022 Target Resolution Date: 05/13/2022 Goal Status: Active Ulcer/skin breakdown will have a volume reduction of 30% by week 4 Date Initiated: 01/07/2022 Date Inactivated: 03/11/2022 Target Resolution Date: 03/11/2022 Goal Status: Met Ulcer/skin breakdown will have a volume reduction of 50% by week 8 Date Initiated: 03/11/2022 Target Resolution Date: 05/13/2022 Goal Status: Active Interventions: Assess patient/caregiver ability to obtain necessary supplies Assess patient/caregiver ability to perform ulcer/skin care regimen upon admission and as needed Assess ulceration(s) every visit Provide education on ulcer and skin care Notes: Electronic Signature(s) Signed: 05/10/2022 4:27:47 PM By: [name] Entered By: [name] on 05/10/2022 09:29:16 -------------------------------------------------------------------------------- Pain Assessment Details Patient  Name: Date of Service: [name] 05/10/2022 9:30 A M Medical Record Number: [id number] Patient Account Number: [id number] Date of Birth/Sex: Treating RN: 18-Jan-1930 (88 y.o. [name] Primary Care Annalise Mcdiarmid: [name] Other Clinician: Referring Issaac Shipper: Treating Alissandra Geoffroy/Extender: [name] in Treatment: 17 Active Problems Location of Pain Severity and Description of Pain Patient Has Paino No Site Locations Pain Management and Medication Current Pain Management: Electronic Signature(s) Signed: 05/10/2022 4:27:47 PM By: [name] Entered By: [name] on 05/10/2022 09:44:19 -------------------------------------------------------------------------------- Patient/Caregiver Education Details Patient Name: Date of Service: [name] 5/22/2023andnbsp9:30 A M Medical Record Number: [id number] Patient Account Number: [id number] Date of Birth/Gender: Treating RN: 1930/10/07 (88 y.o. [name] Primary Care Physician: [name] Other Clinician: Referring Physician: Treating Physician/Extender: [name] in Treatment: 50 Education Assessment Education Provided To: Patient and Caregiver Education Topics Provided Pressure: Methods: Explain/Verbal, Printed Responses: State content correctly Wound/Skin Impairment: Methods: Explain/Verbal, Printed Responses: State content correctly Electronic Signature(s)  Signed: 05/10/2022 4:27:47 PM By: [name] Entered By: [name] on 05/10/2022 09:29:35 -------------------------------------------------------------------------------- Wound Assessment Details Patient Name: Date of Service: [name] 05/10/2022 9:30 A M Medical Record Number: [id number] Patient Account Number: [id number] Date of Birth/Sex: Treating RN: 1930/05/24 (88 y.o. [name] Primary Care Emori Mumme: [name] Other Clinician: Referring  Solara Goodchild: Treating Jamale Spangler/Extender: [name] in Treatment: 17 Wound Status Wound Number: 2 Primary Etiology: Pressure Ulcer Wound Location: Sacrum Wound Status: Open Wounding Event: Pressure Injury Comorbid History: Cataracts, Arrhythmia Date Acquired: 12/02/2021 Weeks Of Treatment: 17 Clustered Wound: No Photos Wound Measurements Length: (cm) 3.4 Width: (cm) 2 Depth: (cm) 0.7 Area: (cm) 5.341 Volume: (cm) 3.738 % Reduction in Area: 89.6% % Reduction in Volume: 96.7% Epithelialization: Large (67-100%) Tunneling: Yes Position (o'clock): 6 Maximum Distance: (cm) 3 Undermining: Yes Starting Position (o'clock): 2 Ending Position (o'clock): 5 Maximum Distance: (cm) 1.6 Wound Description Classification: Category/Stage IV Wound Margin: Well defined, not attached Exudate Amount: Medium Exudate Type: Serosanguineous Exudate Color: red, brown Foul Odor After Cleansing: No Slough/Fibrino No Wound Bed Granulation Amount: Large (67-100%) Exposed Structure Granulation Quality: Red Fascia Exposed: No Necrotic Amount: None Present (0%) Fat Layer (Subcutaneous Tissue) Exposed: Yes Tendon Exposed: Yes Muscle Exposed: No Joint Exposed: No Bone Exposed: No Treatment Notes Wound #2 (Sacrum) Cleanser Soap and Water Discharge Instruction: May shower and wash wound with dial antibacterial soap and water prior to dressing change. Peri-Wound Care Zinc Oxide Ointment 30g tube Discharge Instruction: Apply Zinc Oxide to periwound with each dressing change Topical Primary Dressing Dakin's Solution 0.125%, 16 (oz) Discharge Instruction: Moisten gauze with Dakin's solution Secondary Dressing MPM [name] SAP Bordered Dressing, 7x6.7 (Sacral) (in/in) Discharge Instruction: Apply silicone border over primary dressing as directed. Secured With Compression Wrap Compression Stockings Environmental education officer) Signed: 05/10/2022 4:27:47 PM By:  [name] Entered By: [name] on 05/10/2022 09:43:56 -------------------------------------------------------------------------------- Vitals Details Patient Name: Date of Service: [name], [name] 05/10/2022 9:30 A M Medical Record Number: [id number] Patient Account Number: [id number] Date of Birth/Sex: Treating RN: Apr 22, 1930 (88 y.o. [name] Primary Care Shelma Eiben: [name] Other Clinician: Referring Daleah Coulson: Treating Gerry Heaphy/Extender: [name] in Treatment: 17 Vital Signs Time Taken: 09:37 Temperature (F): 97.8 Height (in): 73 Pulse (bpm): 54 Weight (lbs): 145 Respiratory Rate (breaths/min): 18 Body Mass Index (BMI): 19.1 Blood Pressure (mmHg): 80/47 Reference Range: 80 - 120 mg / dl Electronic Signature(s) Signed: 05/10/2022 4:27:47 PM By: [name] Entered By: [name] on 05/10/2022 09:38:12

## 2022-05-10 NOTE — Progress Notes (Signed)
[name], [name] ([id number]) Visit Report for 05/10/2022 Chief Complaint Document Details Patient Name: Date of Service: IVERSON, SEES 05/10/2022 9:30 A M Medical Record Number: [id number] Patient Account Number: [id number] Date of Birth/Sex: Treating RN: [birth year] (88 y.o. [name] Primary Care Provider: [name] Other Clinician: Referring Provider: Treating Provider/Extender: [name] in Treatment: 17 Information Obtained from: Patient Chief Complaint Sacral and left heel pressure ulcers Electronic Signature(s) Signed: 05/10/2022 10:09:14 AM By: [name] DO Entered By: [name] on 05/10/2022 10:05:53 -------------------------------------------------------------------------------- HPI Details Patient Name: Date of Service: [name] 05/10/2022 9:30 A M Medical Record Number: [id number] Patient Account Number: [id number] Date of Birth/Sex: Treating RN: [birth year] (88 y.o. [name] Primary Care Provider: [name] Other Clinician: Referring Provider: Treating Provider/Extender: [name] in Treatment: 17 History of Present Illness HPI Description: Admission 01/07/2022 Mr. Clemons Kleckner is an 88 year old male with a past medical history of prostate cancer, hypothyroidism and celiac disease that presents to the clinic for a 1 month history of ulcers to his sacrum and right heel. He was admitted to the hospital on 12/05/2021 for rhabdomyolysis secondary to being on the floor for 2 days without being able to get up. He developed a pressure ulcer to his sacrum and right heel. In the hospital they were doing hydrotherapy. He was discharged on 12/27 to a skilled nursing facility. He has been using wet-to-dry dressings with Santyl to the sacrum and Betadine to the right heel wound. He currently denies systemic signs of infection. He has Prevalon boots and a group 2 air mattress. He has a  hard time offloading the sacrum and is on his back most of the day. 2/2; patient presents for follow-up. Santyl has been used to the right heel wound and Dakin's wet-to-dry dressings to the sacral wound. He states he had imaging done to both wound bed areas. He has no issues or complaints today. He denies signs of infection. 2/16; patient presents for follow-up. He is using Dakin's to the sacral wound and Santyl to the left heel wound. He has no issues or complaints today. He denies signs of infection. Unfortunately he is unable to offload the sacral wound bed. He either sits in the bed all day or he is in a wheelchair. 3/2; patient presents for follow-up. He has been using Dakin's wet-to-dry dressing to the sacral wound and Hydrofera Blue to the left heel wound. He has no issues or complaints today. He denies signs of infection. 3/23; patient presents for follow-up. He continues to use Dakin's wet-to-dry dressings to the sacral wound and Hydrofera Blue to the heel wound. The heel wound is closed. Unfortunately we have never been able to get the x-ray results of the sacrum ordered several clinic visits ago. 4/6; patient presents for follow-up. He has been using Dakin's wet-to-dry dressings to the sacral wound. He obtained his x-ray of the sacrum. He has no issues or complaints today. 4/20; patient presents for follow-up. He continues to use Dakin's wet-to-dry dressings to the sacral wound. We have not received the x-ray results. He has no issues or complaints today. He denies signs of infection. 5/4; patient presents for follow-up. He continues to use Dakin's wet-to-dry dressings to the sacral wound. Apparently The facility he resides in did another Portable x-ray of the sacrum and results showed no acute osseous abnormalities with no evidence of bony destruction to suggest osteomyelitis. Patient reports today that he may be moving to  [facility] as he has been [facility]. This may be his last appointment. 5/22; patient presents for follow-up. Has been using Dakin's wet-to-dry dressings to the sacral wound. [facility] the [facility] has fallen through. He still resides at his previous facility. No plans for moving at this time. He would like to try the wound VAC. Electronic Signature(s) Signed: 05/10/2022 10:09:14 AM By: [name] DO Entered By: [name] on 05/10/2022 10:06:31 -------------------------------------------------------------------------------- Physical Exam Details Patient Name: Date of Service: [name], [name] 05/10/2022 9:30 A M Medical Record Number: [id number] Patient Account Number: [id number] Date of Birth/Sex: Treating RN: October 18, 1930 (88 y.o. [name] Primary Care Provider: [name] Other Clinician: Referring Provider: Treating Provider/Extender: [name] in Treatment: 17 Constitutional respirations regular, non-labored and within target range for patient.Marland Kitchen Psychiatric pleasant and cooperative. Notes T the sacrum there is an open wound with granulation tissue and undermining. No surrounding signs of infection. o Electronic Signature(s) Signed: 05/10/2022 10:09:14 AM By: [name] DO Entered By: [name] on 05/10/2022 10:06:52 -------------------------------------------------------------------------------- Physician Orders Details Patient Name: Date of Service: [name] 05/10/2022 9:30 A M Medical Record Number: [id number] Patient Account Number: [id number] Date of Birth/Sex: Treating RN: April 09, 1930 (88 y.o. [name] Primary Care Provider: [name] Other Clinician: Referring Provider: Treating Provider/Extender: [name] in Treatment: [phone] Verbal / Phone Orders: No Diagnosis Coding ICD-10 Coding Code Description L89.154 Pressure ulcer of sacral region, stage 4 E03.9  Hypothyroidism, unspecified Follow-up Appointments ppointment in 2 weeks. - 05/24/22 @ 9:30p Dr. Heber Pantops Leveda Anna, Room7) Return A Other: - **Order for facility to order wound vac. See specific orders below** Continue Dakins wet to dry until wound vac obtained. Bathing/ Shower/ Hygiene Other Bathing/Shower/Hygiene Orders/Instructions: - May have a sponge bath or shower Negative Presssure Wound Therapy Wound Vac to wound continuously at 129m/hg pressure Black and White Foam combination - Apply white foam to tunneling at 6:00 then black foam to wound bed. May bridge to hip area. Apply skin prior to applying drape and apply drape around periwound to protect skin. Edema Control - Lymphedema / SCD / Other Right Lower Extremity Elevate legs to the level of the heart or above for 30 minutes daily and/or when sitting, a frequency of: - Throughout the day Moisturize legs daily. Off-Loading Heel suspension boot to: - Prevalon Boots to bilateral feet Low air-loss mattress (Group 2) Gel wheelchair cushion Turn and reposition every 2 hours Additional Orders / Instructions Follow Nutritious Diet - High protein for wound healing Non Wound Condition Protect area with: - Apply Allevyn foam border dressing to right heal to protect newly healed area. Wound Treatment Wound #2 - Sacrum Cleanser: Soap and Water 2 x Per DXAJ/28Days Discharge Instructions: May shower and wash wound with dial antibacterial soap and water prior to dressing change. Peri-Wound Care: Zinc Oxide Ointment 30g tube 2 x Per Day/15 Days Discharge Instructions: Apply Zinc Oxide to periwound with each dressing change Prim Dressing: Dakin's Solution 0.125%, 16 (oz) 2 x Per Day/15 Days ary Discharge Instructions: Moisten gauze with Dakin's solution Secondary Dressing: MPM [name] SAP Bordered Dressing, 7x6.7 (Sacral) (in/in) 2 x Per Day/15 Days Discharge Instructions: Apply silicone border over primary dressing as directed. Electronic  Signature(s) Signed: 05/10/2022 10:09:14 AM By: HKalman ShanDO Entered By: HKalman Shanon 05/10/2022 10:07:00 -------------------------------------------------------------------------------- Problem List Details Patient Name: Date of Service: OGregary Cromer Ian  C. 05/10/2022 9:30 A M Medical Record Number: [id number] Patient Account Number: [id number] Date of Birth/Sex: Treating RN: 1930/11/18 (88 y.o. [name] Primary Care Provider: [name] Other Clinician: Referring Provider: Treating Provider/Extender: [name] in Treatment: 17 Active Problems ICD-10 Encounter Code Description Active Date MDM Diagnosis L89.154 Pressure ulcer of sacral region, stage 4 01/07/2022 No Yes E03.9 Hypothyroidism, unspecified 01/07/2022 No Yes Inactive Problems Resolved Problems ICD-10 Code Description Active Date Resolved Date L89.610 Pressure ulcer of right heel, unstageable 01/07/2022 01/07/2022 Electronic Signature(s) Signed: 05/10/2022 10:09:14 AM By: [name] DO Entered By: [name] on 05/10/2022 10:05:33 -------------------------------------------------------------------------------- Progress Note Details Patient Name: Date of Service: [name] 05/10/2022 9:30 A M Medical Record Number: [id number] Patient Account Number: [id number] Date of Birth/Sex: Treating RN: [birth year] (88 y.o. [name] Primary Care Provider: [name] Other Clinician: Referring Provider: Treating Provider/Extender: [name] in Treatment: 17 Subjective Chief Complaint Information obtained from Patient Sacral and left heel pressure ulcers History of Present Illness (HPI) Admission 01/07/2022 Mr. Clemons Kutzer is an 88 year old male with a past medical history of prostate cancer, hypothyroidism and celiac disease that presents to the clinic for a 1 month history of ulcers to his sacrum and right heel. He was  admitted to the hospital on 12/05/2021 for rhabdomyolysis secondary to being on the floor for 2 days without being able to get up. He developed a pressure ulcer to his sacrum and right heel. In the hospital they were doing hydrotherapy. He was discharged on 12/27 to a skilled nursing facility. He has been using wet-to-dry dressings with Santyl to the sacrum and Betadine to the right heel wound. He currently denies systemic signs of infection. He has Prevalon boots and a group 2 air mattress. He has a hard time offloading the sacrum and is on his back most of the day. 2/2; patient presents for follow-up. Santyl has been used to the right heel wound and Dakin's wet-to-dry dressings to the sacral wound. He states he had imaging done to both wound bed areas. He has no issues or complaints today. He denies signs of infection. 2/16; patient presents for follow-up. He is using Dakin's to the sacral wound and Santyl to the left heel wound. He has no issues or complaints today. He denies signs of infection. Unfortunately he is unable to offload the sacral wound bed. He either sits in the bed all day or he is in a wheelchair. 3/2; patient presents for follow-up. He has been using Dakin's wet-to-dry dressing to the sacral wound and Hydrofera Blue to the left heel wound. He has no issues or complaints today. He denies signs of infection. 3/23; patient presents for follow-up. He continues to use Dakin's wet-to-dry dressings to the sacral wound and Hydrofera Blue to the heel wound. The heel wound is closed. Unfortunately we have never been able to get the x-ray results of the sacrum ordered several clinic visits ago. 4/6; patient presents for follow-up. He has been using Dakin's wet-to-dry dressings to the sacral wound. He obtained his x-ray of the sacrum. He has no issues or complaints today. 4/20; patient presents for follow-up. He continues to use Dakin's wet-to-dry dressings to the sacral wound. We have not  received the x-ray results. He has no issues or complaints today. He denies signs of infection. 5/4; patient presents for follow-up. He continues to use Dakin's wet-to-dry dressings to the sacral wound. Apparently The facility he resides in did another Portable  x-ray of the sacrum and results showed no acute osseous abnormalities with no evidence of bony destruction to suggest osteomyelitis. Patient reports today that he may be moving to [facility] as [facility] This may be his last appointment. 5/22; patient presents for follow-up. Has been using Dakin's wet-to-dry dressings to the sacral wound. Plans for a bed at the [facility] living [facility] has fallen through. He still resides at his previous facility. No plans for moving at this time. He would like to try the wound VAC. Patient History Information obtained from Patient. Family History Unknown History. Social History Never smoker, Marital Status - Widowed, Alcohol Use - Never, Drug Use - No History, Caffeine Use - Never. Medical History Eyes Patient has history of Cataracts - surgery on both eyes Denies history of Glaucoma, Optic Neuritis Cardiovascular Patient has history of Arrhythmia - A.Fib on Eliquis Denies history of Angina, Congestive Heart Failure, Coronary Artery Disease, Deep Vein Thrombosis, Hypertension, Hypotension, Myocardial Infarction, Peripheral Arterial Disease, Peripheral Venous Disease, Phlebitis, Vasculitis Endocrine Denies history of Type I Diabetes, Type II Diabetes Immunological Denies history of Lupus Erythematosus, Raynaudoos, Scleroderma Integumentary (Skin) Denies history of History of Burn Musculoskeletal Denies history of Gout, Rheumatoid Arthritis, Osteoarthritis, Osteomyelitis Neurologic Denies history of Dementia, Neuropathy, Quadriplegia, Paraplegia, Seizure Disorder Oncologic Denies history of Received Chemotherapy, Received  Radiation Medical A Surgical History Notes nd Cardiovascular Hx Mitral regurgitation Endocrine Hx Hypothyroidism Genitourinary Recent UTI (had sepsis) CKD stage 2 Musculoskeletal Debility/Deconditioning Neurologic Pt. c/o Nerve damage in 2 fingers on Left hand (after fall) Oncologic Prostate cancer (had "seeds") Objective Constitutional respirations regular, non-labored and within target range for patient.. Vitals Time Taken: 9:37 AM, Height: 73 in, Weight: 145 lbs, BMI: 19.1, Temperature: 97.8 F, Pulse: 54 bpm, Respiratory Rate: 18 breaths/min, Blood Pressure: 80/47 mmHg. Psychiatric pleasant and cooperative. General Notes: T the sacrum there is an open wound with granulation tissue and undermining. No surrounding signs of infection. o Integumentary (Hair, Skin) Wound #2 status is Open. Original cause of wound was Pressure Injury. The date acquired was: 12/02/2021. The wound has been in treatment 17 weeks. The wound is located on the Sacrum. The wound measures 3.4cm length x 2cm width x 0.7cm depth; 5.341cm^2 area and 3.738cm^3 volume. There is tendon and Fat Layer (Subcutaneous Tissue) exposed. Tunneling has been noted at 6:00 with a maximum distance of 3cm. Undermining begins at 2:00 and ends at 5:00 with a maximum distance of 1.6cm. There is a medium amount of serosanguineous drainage noted. The wound margin is well defined and not attached to the wound base. There is large (67-100%) red granulation within the wound bed. There is no necrotic tissue within the wound bed. Assessment Active Problems ICD-10 Pressure ulcer of sacral region, stage 4 Hypothyroidism, unspecified Patient's wound has shown improvement in size and appearance since last clinic visit. At this time I recommended trying the wound VAC. His facility will initiate this. We will send them orders. He will need white foam along with black foam under the VAC. Follow-up in 2 weeks. Plan Follow-up  Appointments: Return Appointment in 2 weeks. - 05/24/22 @ 9:30p Dr. Heber Odenton Lev[facility] Anna, Room7) Other: - **Order for facility to order wound vac. See specific orders below** Continue Dakins wet to dry until wound vac obtained. Bathing/ Shower/ Hygiene: Other Bathing/Shower/Hygiene Orders/Instructions: - May have a sponge bath or shower Negative Presssure Wound Therapy: Wound Vac to wound continuously at 139m/hg pressure Black and White Foam combination -  Apply white foam to tunneling at 6:00 then black foam to wound bed. May bridge to hip area. Apply skin prior to applying drape and apply drape around periwound to protect skin. Edema Control - Lymphedema / SCD / Other: Elevate legs to the level of the heart or above for 30 minutes daily and/or when sitting, a frequency of: - Throughout the day Moisturize legs daily. Off-Loading: Heel suspension boot to: - Prevalon Boots to bilateral feet Low air-loss mattress (Group 2) Gel wheelchair cushion Turn and reposition every 2 hours Additional Orders / Instructions: Follow Nutritious Diet - High protein for wound healing Non Wound Condition: Protect area with: - Apply Allevyn foam border dressing to right heal to protect newly healed area. WOUND #2: - Sacrum Wound Laterality: Cleanser: Soap and Water 2 x Per UXL/24 Days Discharge Instructions: May shower and wash wound with dial antibacterial soap and water prior to dressing change. Peri-Wound Care: Zinc Oxide Ointment 30g tube 2 x Per Day/15 Days Discharge Instructions: Apply Zinc Oxide to periwound with each dressing change Prim Dressing: Dakin's Solution 0.125%, 16 (oz) 2 x Per Day/15 Days ary Discharge Instructions: Moisten gauze with Dakin's solution Secondary Dressing: MPM [name] SAP Bordered Dressing, 7x6.7 (Sacral) (in/in) 2 x Per Day/15 Days Discharge Instructions: Apply silicone border over primary dressing as directed. 1. Start wound VAC 2. Continue with Dakin's wet-to-dry dressing until  VAC is initiated 3. Follow-up in 2 weeks 4. Aggressive offloading Electronic Signature(s) Signed: 05/10/2022 10:09:14 AM By: [name] DO Entered By: [name] on 05/10/2022 10:08:13 -------------------------------------------------------------------------------- HxROS Details Patient Name: Date of Service: [name] 05/10/2022 9:30 A M Medical Record Number: [id number] Patient Account Number: [id number] Date of Birth/Sex: Treating RN: 1930/12/09 (88 y.o. [name] Primary Care Provider: [name] Other Clinician: Referring Provider: Treating Provider/Extender: [name] in Treatment: 41 Information Obtained From Patient Eyes Medical History: Positive for: Cataracts - surgery on both eyes Negative for: Glaucoma; Optic Neuritis Cardiovascular Medical History: Positive for: Arrhythmia - A.Fib on Eliquis Negative for: Angina; Congestive Heart Failure; Coronary Artery Disease; Deep Vein Thrombosis; Hypertension; Hypotension; Myocardial Infarction; Peripheral Arterial Disease; Peripheral Venous Disease; Phlebitis; Vasculitis Past Medical History Notes: Hx Mitral regurgitation Endocrine Medical History: Negative for: Type I Diabetes; Type II Diabetes Past Medical History Notes: Hx Hypothyroidism Genitourinary Medical History: Past Medical History Notes: Recent UTI (had sepsis) CKD stage 2 Immunological Medical History: Negative for: Lupus Erythematosus; Raynauds; Scleroderma Integumentary (Skin) Medical History: Negative for: History of Burn Musculoskeletal Medical History: Negative for: Gout; Rheumatoid Arthritis; Osteoarthritis; Osteomyelitis Past Medical History Notes: Debility/Deconditioning Neurologic Medical History: Negative for: Dementia; Neuropathy; Quadriplegia; Paraplegia; Seizure Disorder Past Medical History Notes: Pt. c/o Nerve damage in 2 fingers on Left hand (after fall) Oncologic Medical  History: Negative for: Received Chemotherapy; Received Radiation Past Medical History Notes: Prostate cancer (had "seeds") HBO Extended History Items Eyes: Cataracts Immunizations Pneumococcal Vaccine: Received Pneumococcal Vaccination: Yes Received Pneumococcal Vaccination On or After 60th Birthday: Yes Implantable Devices No devices added Family and Social History Unknown History: Yes; Never smoker; Marital Status - Widowed; Alcohol Use: Never; Drug Use: No History; Caffeine Use: Never; Financial Concerns: No; Food, Clothing or Shelter Needs: No; Support System Lacking: No; Transportation Concerns: No Electronic Signature(s) Signed: 05/10/2022 10:09:14 AM By: [name] DO Signed: 05/10/2022 4:27:47 PM By: [name] Entered By: [name] on 05/10/2022 10:06:36 -------------------------------------------------------------------------------- SuperBill Details Patient Name: Date of Service: [name] 05/10/2022 Medical Record Number: [id number] Patient Account Number: [id number]  Date of Birth/Sex: Treating RN: [birth year] (88 y.o. [name] Primary Care Provider: [name] Other Clinician: Referring Provider: Treating Provider/Extender: [name] in Treatment: 17 Diagnosis Coding ICD-10 Codes Code Description [phone] Pressure ulcer of sacral region, stage 4 E03.9 Hypothyroidism, unspecified Facility Procedures CPT4 Code: [id number] Description: 28638 - WOUND CARE VISIT-LEV 3 EST PT Modifier: Quantity: 1 Physician Procedures : CPT4 Code Description Modifier [id number] - WC PHYS LEVEL 3 - EST PT ICD-10 Diagnosis Description L89.154 Pressure ulcer of sacral region, stage 4 E03.9 Hypothyroidism, unspecified Quantity: 1 Electronic Signature(s) Signed: 05/10/2022 10:09:14 AM By: [name] DO Entered By: [name] on 05/10/2022 10:08:24

## 2022-05-11 DIAGNOSIS — R52 Pain, unspecified: Secondary | ICD-10-CM | POA: Diagnosis not present

## 2022-05-11 DIAGNOSIS — M1990 Unspecified osteoarthritis, unspecified site: Secondary | ICD-10-CM | POA: Diagnosis not present

## 2022-05-24 ENCOUNTER — Encounter (HOSPITAL_BASED_OUTPATIENT_CLINIC_OR_DEPARTMENT_OTHER): Payer: Medicare Other | Admitting: Internal Medicine

## 2022-05-25 ENCOUNTER — Encounter (HOSPITAL_BASED_OUTPATIENT_CLINIC_OR_DEPARTMENT_OTHER): Payer: Medicare Other | Attending: Internal Medicine | Admitting: Internal Medicine

## 2022-05-25 DIAGNOSIS — K900 Celiac disease: Secondary | ICD-10-CM | POA: Insufficient documentation

## 2022-05-25 DIAGNOSIS — E039 Hypothyroidism, unspecified: Secondary | ICD-10-CM | POA: Insufficient documentation

## 2022-05-25 DIAGNOSIS — L89154 Pressure ulcer of sacral region, stage 4: Secondary | ICD-10-CM | POA: Insufficient documentation

## 2022-05-25 NOTE — Progress Notes (Signed)
[name], [name] ([id number]) Visit Report for 05/25/2022 HPI Details Patient Name: Date of Service: [name], [name] 05/25/2022 9:30 A M Medical Record Number: [id number] Patient Account Number: [id number] Date of Birth/Sex: Treating RN: 1930-07-09 (88 y.o. [name] Primary Care Provider: Scarlette [name] Other Clinician: Referring Provider: Treating Provider/Extender: [name] in Treatment: 17 History of Present Illness HPI Description: Admission 01/07/2022 [name] is an 88 year old male with a past medical history of prostate cancer, hypothyroidism and celiac disease that presents to the clinic for a 1 month history of ulcers to his sacrum and right heel. He was admitted to the hospital on 12/05/2021 for rhabdomyolysis secondary to being on the floor for 2 days without being able to get up. He developed a pressure ulcer to his sacrum and right heel. In the hospital they were doing hydrotherapy. He was discharged on 12/27 to a skilled nursing facility. He has been using wet-to-dry dressings with Santyl to the sacrum and Betadine to the right heel wound. He currently denies systemic signs of infection. He has Prevalon boots and a group 2 air mattress. He has a hard time offloading the sacrum and is on his back most of the day. 2/2; patient presents for follow-up. Santyl has been used to the right heel wound and Dakin's wet-to-dry dressings to the sacral wound. He states he had imaging done to both wound bed areas. He has no issues or complaints today. He denies signs of infection. 2/16; patient presents for follow-up. He is using Dakin's to the sacral wound and Santyl to the left heel wound. He has no issues or complaints today. He denies signs of infection. Unfortunately he is unable to offload the sacral wound bed. He either sits in the bed all day or he is in a wheelchair. 3/2; patient presents for follow-up. He has been using Dakin's wet-to-dry  dressing to the sacral wound and Hydrofera Blue to the left heel wound. He has no issues or complaints today. He denies signs of infection. 3/23; patient presents for follow-up. He continues to use Dakin's wet-to-dry dressings to the sacral wound and Hydrofera Blue to the heel wound. The heel wound is closed. Unfortunately we have never been able to get the x-ray results of the sacrum ordered several clinic visits ago. 4/6; patient presents for follow-up. He has been using Dakin's wet-to-dry dressings to the sacral wound. He obtained his x-ray of the sacrum. He has no issues or complaints today. 4/20; patient presents for follow-up. He continues to use Dakin's wet-to-dry dressings to the sacral wound. We have not received the x-ray results. He has no issues or complaints today. He denies signs of infection. 5/4; patient presents for follow-up. He continues to use Dakin's wet-to-dry dressings to the sacral wound. Apparently The facility he resides in did another Portable x-ray of the sacrum and results showed no acute osseous abnormalities with no evidence of bony destruction to suggest osteomyelitis. Patient reports today that he may be moving to [facility] as [facility] This may be his last appointment. 5/22; patient presents for follow-up. Has been using Dakin's wet-to-dry dressings to the sacral wound. [facility] a bed [facility] the [facility] has fallen through. He still resides at his previous facility. No plans for moving at this time. He would like to try the wound VAC. 6/6; 2-week follow-up. He has been using Dakin's wet-to-dry. He still does not have a  wound VAC but we have a note from the facility suggesting that they are now closed and being able to obtain it. He uses an air mattress in his bed. He spends most of the time in bed and his son thinks that pressure relief is fairly good. Not exactly certain about nutritional  intake however. Electronic Signature(s) Signed: 05/25/2022 5:45:31 PM By: Jerry Ham MD Entered By: [name] on 05/25/2022 09:57:31 -------------------------------------------------------------------------------- Physical Exam Details Patient Name: Date of Service: [name] 05/25/2022 9:30 A M Medical Record Number: [id number] Patient Account Number: [id number] Date of Birth/Sex: Treating RN: 04-17-1930 (88 y.o. [name] Primary Care Provider: Scarlette [name] Other Clinician: Referring Provider: Treating Provider/Extender: [name] in Treatment: 24 Constitutional Patient is hypotensive.However appears stable. Pulse regular and within target range for patient.Marland Kitchen Respirations regular, non-labored and within target range.. Temperature is normal and within the target range for the patient.Marland Kitchen Appears in no distressHowever frail-appearing. Notes Wound exam; lower sacrum open wound with significant undermining from 1-7 o'clock maximum at 6-2 cm. There is no surrounding erythema no exposed bone. Appears to have some better granulation superiorly in the wound. There is no surrounding erythema no crepitus Electronic Signature(s) Signed: 05/25/2022 5:45:31 PM By: Jerry Ham MD Entered By: [name] on 05/25/2022 09:58:45 -------------------------------------------------------------------------------- Physician Orders Details Patient Name: Date of Service: [name] 05/25/2022 9:30 A M Medical Record Number: [id number] Patient Account Number: [id number] Date of Birth/Sex: Treating RN: 1930/09/10 (88 y.o. [name] Primary Care Provider: Scarlette [name] Other Clinician: Referring Provider: Treating Provider/Extender: [name] in Treatment: [phone] Verbal / Phone Orders: No Diagnosis Coding ICD-10 Coding Code Description L89.154 Pressure ulcer of sacral region, stage 4 E03.9 Hypothyroidism,  unspecified Follow-up Appointments ppointment in 2 weeks. - 06/10/22 @ 9:30p Dr. Heber [name], Room7) *Harrel Lemon* Return A Other: - **Order for facility to order wound vac. See specific orders below** Until wound vac obtained apply Prisma silver collagen followed by saline moistened gauze and dry dressing. Bathing/ Shower/ Hygiene Other Bathing/Shower/Hygiene Orders/Instructions: - May have a sponge bath or shower Negative Presssure Wound Therapy Wound Vac to wound continuously at 134m/hg pressure - Change vac dressing 3x week. Black and White Foam combination - Apply Prisma silver collagen then white foam to undermining from 2:00 to 6:00 then black foam over Prisma. May bridge to hip area. Apply skin prep prior to applying drape and apply drape around periwound to protect skin. Edema Control - Lymphedema / SCD / Other Right Lower Extremity Elevate legs to the level of the heart or above for 30 minutes daily and/or when sitting, a frequency of: - Throughout the day Moisturize legs daily. Off-Loading Heel suspension boot to: - Prevalon Boots to bilateral feet Low air-loss mattress (Group 2) Gel wheelchair cushion Turn and reposition every 2 hours Additional Orders / Instructions Follow Nutritious Diet - High protein for wound healing Non Wound Condition Protect area with: - Apply Allevyn foam border dressing to right heal to protect newly healed area. Wound Treatment Wound #2 - Sacrum Cleanser: Soap and Water 2 x Per DWER/15Days Discharge Instructions: May shower and wash wound with dial antibacterial soap and water prior to dressing change. Prim Dressing: Promogran Prisma Matrix, 4.34 (sq in) (silver collagen) 2 x Per Day/15 Days ary Discharge Instructions: Moisten collagen with saline or hydrogel Secondary Dressing: MPM [name] SAP Bordered Dressing, 7x6.7 (Sacral) (in/in) 2 x Per Day/15 Days Discharge Instructions: Apply silicone border over primary dressing as  directed. Secondary  Dressing: Woven Gauze Sponge, Non-Sterile 4x4 in 2 x Per Day/15 Days Discharge Instructions: Apply saline moist gauze over Prisma Electronic Signature(s) Signed: 05/25/2022 5:45:31 PM By: Jerry Ham MD Signed: 05/25/2022 6:00:15 PM By: [name] Entered By: [name] on 05/25/2022 09:56:00 -------------------------------------------------------------------------------- Problem List Details Patient Name: Date of Service: [name] 05/25/2022 9:30 A M Medical Record Number: [id number] Patient Account Number: [id number] Date of Birth/Sex: Treating RN: 1930-09-05 (88 y.o. [name] Primary Care Provider: Scarlette [name] Other Clinician: Referring Provider: Treating Provider/Extender: [name] in Treatment: 19 Active Problems ICD-10 Encounter Code Description Active Date MDM Diagnosis L89.154 Pressure ulcer of sacral region, stage 4 01/07/2022 No Yes E03.9 Hypothyroidism, unspecified 01/07/2022 No Yes Inactive Problems Resolved Problems ICD-10 Code Description Active Date Resolved Date L89.610 Pressure ulcer of right heel, unstageable 01/07/2022 01/07/2022 Electronic Signature(s) Signed: 05/25/2022 5:45:31 PM By: Jerry Ham MD Entered By: [name] on 05/25/2022 09:56:15 -------------------------------------------------------------------------------- Progress Note Details Patient Name: Date of Service: [name] 05/25/2022 9:30 A M Medical Record Number: [id number] Patient Account Number: [id number] Date of Birth/Sex: Treating RN: 10/24/1930 (88 y.o. [name] Primary Care Provider: Scarlette [name] Other Clinician: Referring Provider: Treating Provider/Extender: [name] in Treatment: 19 Subjective History of Present Illness (HPI) Admission 01/07/2022 [name] is an 88 year old male with a past medical history of prostate cancer, hypothyroidism and celiac disease that  presents to the clinic for a 1 month history of ulcers to his sacrum and right heel. He was admitted to the hospital on 12/05/2021 for rhabdomyolysis secondary to being on the floor for 2 days without being able to get up. He developed a pressure ulcer to his sacrum and right heel. In the hospital they were doing hydrotherapy. He was discharged on 12/27 to a skilled nursing facility. He has been using wet-to-dry dressings with Santyl to the sacrum and Betadine to the right heel wound. He currently denies systemic signs of infection. He has Prevalon boots and a group 2 air mattress. He has a hard time offloading the sacrum and is on his back most of the day. 2/2; patient presents for follow-up. Santyl has been used to the right heel wound and Dakin's wet-to-dry dressings to the sacral wound. He states he had imaging done to both wound bed areas. He has no issues or complaints today. He denies signs of infection. 2/16; patient presents for follow-up. He is using Dakin's to the sacral wound and Santyl to the left heel wound. He has no issues or complaints today. He denies signs of infection. Unfortunately he is unable to offload the sacral wound bed. He either sits in the bed all day or he is in a wheelchair. 3/2; patient presents for follow-up. He has been using Dakin's wet-to-dry dressing to the sacral wound and Hydrofera Blue to the left heel wound. He has no issues or complaints today. He denies signs of infection. 3/23; patient presents for follow-up. He continues to use Dakin's wet-to-dry dressings to the sacral wound and Hydrofera Blue to the heel wound. The heel wound is closed. Unfortunately we have never been able to get the x-ray results of the sacrum ordered several clinic visits ago. 4/6; patient presents for follow-up. He has been using Dakin's wet-to-dry dressings to the sacral wound. He obtained his x-ray of the sacrum. He has no issues or complaints today. 4/20; patient presents for  follow-up. He continues to use Dakin's wet-to-dry dressings to  the sacral wound. We have not received the x-ray results. He has no issues or complaints today. He denies signs of infection. 5/4; patient presents for follow-up. He continues to use Dakin's wet-to-dry dressings to the sacral wound. Apparently The facility he resides in did another Portable x-ray of the sacrum and results showed no acute osseous abnormalities with no evidence of bony destruction to suggest osteomyelitis. Patient reports today that he may be moving to [facility] as [facility] This may be his last appointment. 5/22; patient presents for follow-up. Has been using Dakin's wet-to-dry dressings to the sacral wound. [facility] a bed at the [facility] living [facility] has fallen through. He still resides at his previous facility. No plans for moving at this time. He would like to try the wound VAC. 6/6; 2-week follow-up. He has been using Dakin's wet-to-dry. He still does not have a wound VAC but we have a note from the facility suggesting that they are now closed and being able to obtain it. He uses an air mattress in his bed. He spends most of the time in bed and his son thinks that pressure relief is fairly good. Not exactly certain about nutritional intake however. Objective Constitutional Patient is hypotensive.However appears stable. Pulse regular and within target range for patient.Marland Kitchen Respirations regular, non-labored and within target range.. Temperature is normal and within the target range for the patient.Marland Kitchen Appears in no distressHowever frail-appearing. Vitals Time Taken: 9:30 AM, Height: 73 in, Weight: 145 lbs, BMI: 19.1, Temperature: 97.6 F, Pulse: 57 bpm, Respiratory Rate: 18 breaths/min, Blood Pressure: 91/53 mmHg. General Notes: Wound exam; lower sacrum open wound with significant undermining from 1-7 o'clock maximum at 6-2 cm. There is no surrounding  erythema no exposed bone. Appears to have some better granulation superiorly in the wound. There is no surrounding erythema no crepitus Integumentary (Hair, Skin) Wound #2 status is Open. Original cause of wound was Pressure Injury. The date acquired was: 12/02/2021. The wound has been in treatment 19 weeks. The wound is located on the Sacrum. The wound measures 3.4cm length x 2.2cm width x 0.7cm depth; 5.875cm^2 area and 4.112cm^3 volume. There is tendon and Fat Layer (Subcutaneous Tissue) exposed. There is no tunneling noted, however, there is undermining starting at 2:00 and ending at 6:00 with a maximum distance of 2cm. There is a medium amount of serosanguineous drainage noted. The wound margin is well defined and not attached to the wound base. There is large (67-100%) red granulation within the wound bed. There is a small (1-33%) amount of necrotic tissue within the wound bed including Adherent Slough. Assessment Active Problems ICD-10 Pressure ulcer of sacral region, stage 4 Hypothyroidism, unspecified Plan Follow-up Appointments: Return Appointment in 2 weeks. - 06/10/22 @ 9:30p Dr. Heber [place] [name], Room7) Harrel Lemon* Other: - **Order for facility to order wound vac. See specific orders below** Until wound vac obtained apply Prisma silver collagen followed by saline moistened gauze and dry dressing. Bathing/ Shower/ Hygiene: Other Bathing/Shower/Hygiene Orders/Instructions: - May have a sponge bath or shower Negative Presssure Wound Therapy: Wound Vac to wound continuously at 176m/hg pressure - Change vac dressing 3x week. Black and White Foam combination - Apply Prisma silver collagen then white foam to undermining from 2:00 to 6:00 then black foam over Prisma. May bridge to hip area. Apply skin prep prior to applying drape and apply drape around periwound to protect skin. Edema Control - Lymphedema / SCD / Other: Elevate legs  to the level of the heart or above for 30 minutes daily  and/or when sitting, a frequency of: - Throughout the day Moisturize legs daily. Off-Loading: Heel suspension boot to: - Prevalon Boots to bilateral feet Low air-loss mattress (Group 2) Gel wheelchair cushion Turn and reposition every 2 hours Additional Orders / Instructions: Follow Nutritious Diet - High protein for wound healing Non Wound Condition: Protect area with: - Apply Allevyn foam border dressing to right heal to protect newly healed area. WOUND #2: - Sacrum Wound Laterality: Cleanser: Soap and Water 2 x Per DTH/43 Days Discharge Instructions: May shower and wash wound with dial antibacterial soap and water prior to dressing change. Prim Dressing: Promogran Prisma Matrix, 4.34 (sq in) (silver collagen) 2 x Per Day/15 Days ary Discharge Instructions: Moisten collagen with saline or hydrogel Secondary Dressing: MPM [name] SAP Bordered Dressing, 7x6.7 (Sacral) (in/in) 2 x Per Day/15 Days Discharge Instructions: Apply silicone border over primary dressing as directed. Secondary Dressing: Woven Gauze Sponge, Non-Sterile 4x4 in 2 x Per Day/15 Days Discharge Instructions: Apply saline moist gauze over Prisma 1. The surface of the wound is fairly clean but not making much improvement in the undermining area or with granulation 2. I discontinued the Dakin's wet-to-dry put him on silver collagen hydrogel with backing wet-to-dry. 3. The above should not preclude use of wound VAC when it arrives in fact we can use the collagen under the wound VAC especially in the undermining areas. 4. As far as I can determine in discussion with his son they are offloading this adequately at [facility]. Not exactly certain about his nutritional adequacy however he is taking a protein supplement Electronic Signature(s) Signed: 05/25/2022 5:45:31 PM By: Jerry Ham MD Entered By: [name] on 05/25/2022  10:00:06 -------------------------------------------------------------------------------- SuperBill Details Patient Name: Date of Service: [name] 05/25/2022 Medical Record Number: [id number] Patient Account Number: [id number] Date of Birth/Sex: Treating RN: 02/03/1930 (88 y.o. [name] Primary Care Provider: Scarlette [name] Other Clinician: Referring Provider: Treating Provider/Extender: [name] in Treatment: 19 Diagnosis Coding ICD-10 Codes Code Description [id number] Pressure ulcer of sacral region, stage 4 E03.9 Hypothyroidism, unspecified Facility Procedures CPT4 Code: [id number] Description: 32761 - WOUND CARE VISIT-LEV 3 EST PT Modifier: Quantity: 1 Physician Procedures : CPT4 Code Description Modifier [id number] - WC PHYS LEVEL 4 - EST PT ICD-10 Diagnosis Description ICD-10 Diagnosis Description L89.154 Pressure ulcer of sacral region, stage 4 Quantity: 1 Electronic Signature(s) Signed: 05/25/2022 5:45:31 PM By: Jerry Ham MD Entered By: [name] on 05/25/2022 10:00:21

## 2022-05-25 NOTE — Progress Notes (Signed)
EVERITT, WENNER ([id number]) Visit Report for 05/25/2022 Arrival Information Details Patient Name: Date of Service: [name], [name] 05/25/2022 9:30 A M Medical Record Number: [id number] Patient Account Number: [id number] Date of Birth/Sex: Treating RN: November 15, 1930 (88 y.o. [name] Primary Care Marbella Markgraf: [name] Other Clinician: Referring Tyrion Glaude: Treating Neidra Girvan/Extender: Pauletta Browns in Treatment: 49 Visit Information History Since Last Visit Added or deleted any medications: No Patient Arrived: Wheel Chair Any new allergies or adverse reactions: No Arrival Time: 09:18 Had a fall or experienced change in No Accompanied By: son activities of daily living that may affect Transfer Assistance: Manual risk of falls: Patient Identification Verified: Yes Signs or symptoms of abuse/neglect since last visito No Secondary Verification Process Completed: Yes Hospitalized since last visit: No Patient Requires Transmission-Based Precautions: No Implantable device outside of the clinic excluding No Patient Has Alerts: Yes cellular tissue based products placed in the center Patient Alerts: Patient on Blood Thinner since last visit: R ABI non compressible Has Dressing in Place as Prescribed: Yes Pain Present Now: No Electronic Signature(s) Signed: 05/25/2022 6:00:15 PM By: [name] Entered By: [name] on 05/25/2022 09:18:55 -------------------------------------------------------------------------------- Clinic Level of Care Assessment Details Patient Name: Date of Service: [name], [name] 05/25/2022 9:30 A M Medical Record Number: [id number] Patient Account Number: [id number] Date of Birth/Sex: Treating RN: 25-Apr-1930 (88 y.o. [name] Primary Care Arlesia Kiel: [name] Other Clinician: Referring Theo Reither: Treating Karah Caruthers/Extender: Pauletta Browns in Treatment: 66 Clinic Level of Care Assessment  Items TOOL 4 Quantity Score X- 1 0 Use when only an EandM is performed on FOLLOW-UP visit ASSESSMENTS - Nursing Assessment / Reassessment X- 1 10 Reassessment of Co-morbidities (includes updates in patient status) X- 1 5 Reassessment of Adherence to Treatment Plan ASSESSMENTS - Wound and Skin A ssessment / Reassessment X - Simple Wound Assessment / Reassessment - one wound 1 5 _0  - 0 Complex Wound Assessment / Reassessment - multiple wounds _1  - 0 Dermatologic / Skin Assessment (not related to wound area) ASSESSMENTS - Focused Assessment _2  - 0 Circumferential Edema Measurements - multi extremities _3  - 0 Nutritional Assessment / Counseling / Intervention _4  - 0 Lower Extremity Assessment (monofilament, tuning fork, pulses) _5  - 0 Peripheral Arterial Disease Assessment (using hand held doppler) ASSESSMENTS - Ostomy and/or Continence Assessment and Care _6  - 0 Incontinence Assessment and Management _7  - 0 Ostomy Care Assessment and Management (repouching, etc.) PROCESS - Coordination of Care _8  - 0 Simple Patient / Family Education for ongoing care X- 1 20 Complex (extensive) Patient / Family Education for ongoing care _9  - 0 Staff obtains Programmer, systems, Records, T Results / Process Orders est X- 1 10 Staff telephones HHA, Nursing Homes / Clarify orders / etc _10  - 0 Routine Transfer to another Facility (non-emergent condition) _11  - 0 Routine Hospital Admission (non-emergent condition) _12  - 0 New Admissions / Biomedical engineer / Ordering NPWT Apligraf, etc. , _13  - 0 Emergency Hospital Admission (emergent condition) _14  - 0 Simple Discharge Coordination _15  - 0 Complex (extensive) Discharge Coordination PROCESS - Special Needs _16  - 0 Pediatric / Minor Patient Management _17  - 0 Isolation Patient Management _18  - 0 Hearing / Language / Visual special needs _19  - 0 Assessment of Community assistance (transportation, D/C planning, etc.) _20  - 0 Additional  assistance / Altered mentation _21  - 0 Support Surface(s) Assessment (bed, cushion, seat, etc.) INTERVENTIONS - Wound Cleansing / Measurement X - Simple Wound Cleansing - one wound 1  5 _0  - 0 Complex Wound Cleansing - multiple wounds X- 1 5 Wound Imaging (photographs - any number of wounds) _1  - 0 Wound Tracing (instead of photographs) X- 1 5 Simple Wound Measurement - one wound _2  - 0 Complex Wound Measurement - multiple wounds INTERVENTIONS - Wound Dressings _3  - 0 Small Wound Dressing one or multiple wounds X- 1 15 Medium Wound Dressing one or multiple wounds _4  - 0 Large Wound Dressing one or multiple wounds <WJXBJYNWGNFAOZHY>_8<\/MVHQIONGEXBMWUXL>_2  - 0 Application of Medications - topical <GMWNUUVOZDGUYQIH>_4<\/VQQVZDGLOVFIEPPI>_9  - 0 Application of Medications - injection INTERVENTIONS - Miscellaneous _7  - 0 External ear exam _8  - 0 Specimen Collection (cultures, biopsies, blood, body fluids, etc.) _9  - 0 Specimen(s) / Culture(s) sent or taken to Lab for analysis _10  - 0 Patient Transfer (multiple staff / Civil Service fast streamer / Similar devices) _11  - 0 Simple Staple / Suture removal (25 or less) _12  - 0 Complex Staple / Suture removal (26 or more) _13  - 0 Hypo / Hyperglycemic Management (close monitor of Blood Glucose) _14  - 0 Ankle / Brachial Index (ABI) - do not check if billed separately X- 1 5 Vital Signs Has the patient been seen at the hospital within the last three years: Yes Total Score: 85 Level Of Care: New/Established - Level 3 Electronic Signature(s) Signed: 05/25/2022 6:00:15 PM By: [name] Entered By: [name] on 05/25/2022 09:57:09 -------------------------------------------------------------------------------- Encounter Discharge Information Details Patient Name: Date of Service: [name], [name] 05/25/2022 9:30 A M Medical Record Number: [id number] Patient Account Number: [id number] Date of Birth/Sex: Treating RN: 06/02/1930 (88 y.o. [name] Primary Care Kaleab Frasier: [name] Other Clinician: Referring  Daniya Aramburo: Treating Aletha Allebach/Extender: Pauletta Browns in Treatment: 19 Encounter Discharge Information Items Discharge Condition: Stable Ambulatory Status: Wheelchair Discharge Destination: Home Transportation: Private Auto Accompanied By: son Schedule Follow-up Appointment: Yes Clinical Summary of Care: Provided on 05/25/2022 Form Type Recipient Paper Patient Patient Electronic Signature(s) Signed: 05/25/2022 6:00:15 PM By: [name] Entered By: [name] on 05/25/2022 10:13:38 -------------------------------------------------------------------------------- Lower Extremity Assessment Details Patient Name: Date of Service: [name] 05/25/2022 9:30 A M Medical Record Number: [id number] Patient Account Number: [id number] Date of Birth/Sex: Treating RN: 1930/10/13 (88 y.o. [name] Primary Care Kitana Gage: [name] Other Clinician: Referring Deyanira Fesler: Treating Persephone Schriever/Extender: Pauletta Browns in Treatment: 19 Electronic Signature(s) Signed: 05/25/2022 6:00:15 PM By: [name] Entered By: [name] on 05/25/2022 09:30:49 -------------------------------------------------------------------------------- Multi Wound Chart Details Patient Name: Date of Service: [name] 05/25/2022 9:30 A M Medical Record Number: [id number] Patient Account Number: [id number] Date of Birth/Sex: Treating RN: 12/30/1929 (88 y.o. [name] Primary Care Audriana Aldama: [name] Other Clinician: Referring Troi Florendo: Treating Shawntez Dickison/Extender: Pauletta Browns in Treatment: 19 Vital Signs Height(in): 73 Pulse(bpm): 61 Weight(lbs): 145 Blood Pressure(mmHg): 91/53 Body Mass Index(BMI): 19.1 Temperature(F): 97.6 Respiratory Rate(breaths/min): 18 Photos: [N/A:N/A] Sacrum N/A N/A Wound Location: Pressure Injury N/A N/A Wounding Event: Pressure Ulcer N/A N/A Primary Etiology: Cataracts,  Arrhythmia N/A N/A Comorbid History: 12/02/2021 N/A N/A Date Acquired: 60 N/A N/A Weeks of Treatment: Open N/A N/A Wound Status: No N/A N/A Wound Recurrence: 3.4x2.2x0.7 N/A N/A Measurements L x W x D (cm) 5.875 N/A N/A A (cm) : rea 4.112 N/A N/A Volume (cm) : 88.50% N/A N/A % Reduction in A rea: 96.30% N/A N/A % Reduction in Volume: 2 Starting Position 1 (o'clock): 6 Ending Position 1 (o'clock): 2 Maximum Distance 1 (cm): Yes N/A N/A Undermining: Category/Stage IV  N/A N/A Classification: Medium N/A N/A Exudate A mount: Serosanguineous N/A N/A Exudate Type: red, brown N/A N/A Exudate Color: Well defined, not attached N/A N/A Wound Margin: Large (67-100%) N/A N/A Granulation A mount: Red N/A N/A Granulation Quality: Small (1-33%) N/A N/A Necrotic A mount: Fat Layer (Subcutaneous Tissue): Yes N/A N/A Exposed Structures: Tendon: Yes Fascia: No Muscle: No Joint: No Bone: No Large (67-100%) N/A N/A Epithelialization: Treatment Notes Electronic Signature(s) Signed: 05/25/2022 5:45:31 PM By: Linton Ham MD Signed: 05/25/2022 6:00:15 PM By: [name] Entered By: Linton Ham on 05/25/2022 09:56:19 -------------------------------------------------------------------------------- Multi-Disciplinary Care Plan Details Patient Name: Date of Service: [name], [name]. 05/25/2022 9:30 A M Medical Record Number: [id number] Patient Account Number: [id number] Date of Birth/Sex: Treating RN: May 31, 1930 (88 y.o. [name] Primary Care Oather Muilenburg: [name] Other Clinician: Referring Aikam Vinje: Treating Javionna Leder/Extender: Pauletta Browns in Treatment: 19 Multidisciplinary Care Plan reviewed with physician Active Inactive Pressure Nursing Diagnoses: Knowledge deficit related to causes and risk factors for pressure ulcer development Knowledge deficit related to management of pressures ulcers Potential for impaired tissue  integrity related to pressure, friction, moisture, and shear Goals: Patient/caregiver will verbalize risk factors for pressure ulcer development Date Initiated: 01/07/2022 Target Resolution Date: 06/15/2022 Goal Status: Active Patient/caregiver will verbalize understanding of pressure ulcer management Date Initiated: 01/07/2022 Target Resolution Date: 06/15/2022 Goal Status: Active Interventions: Assess: immobility, friction, shearing, incontinence upon admission and as needed Assess offloading mechanisms upon admission and as needed Assess potential for pressure ulcer upon admission and as needed Provide education on pressure ulcers Notes: 03/11/22: Pressure relief ongoing. Wound/Skin Impairment Nursing Diagnoses: Impaired tissue integrity Knowledge deficit related to ulceration/compromised skin integrity Goals: Patient/caregiver will verbalize understanding of skin care regimen Date Initiated: 01/07/2022 Target Resolution Date: 06/15/2022 Goal Status: Active Ulcer/skin breakdown will have a volume reduction of 30% by week 4 Date Initiated: 01/07/2022 Date Inactivated: 03/11/2022 Target Resolution Date: 03/11/2022 Goal Status: Met Ulcer/skin breakdown will have a volume reduction of 50% by week 8 Date Initiated: 03/11/2022 Target Resolution Date: 05/13/2022 Goal Status: Active Interventions: Assess patient/caregiver ability to obtain necessary supplies Assess patient/caregiver ability to perform ulcer/skin care regimen upon admission and as needed Assess ulceration(s) every visit Provide education on ulcer and skin care Notes: Electronic Signature(s) Signed: 05/25/2022 6:00:15 PM By: [name] Entered By: [name] on 05/25/2022 09:18:08 -------------------------------------------------------------------------------- Pain Assessment Details Patient Name: Date of Service: [name] 05/25/2022 9:30 A M Medical Record Number: [id number] Patient Account Number:  [id number] Date of Birth/Sex: Treating RN: October 05, 1930 (88 y.o. [name] Primary Care Cadince Hilscher: [name] Other Clinician: Referring Jaxan Michel: Treating Brityn Mastrogiovanni/Extender: Pauletta Browns in Treatment: 19 Active Problems Location of Pain Severity and Description of Pain Patient Has Paino No Site Locations Pain Management and Medication Current Pain Management: Electronic Signature(s) Signed: 05/25/2022 6:00:15 PM By: [name] Entered By: [name] on 05/25/2022 09:30:43 -------------------------------------------------------------------------------- Patient/Caregiver Education Details Patient Name: Date of Service: [name] 6/6/2023andnbsp9:30 A M Medical Record Number: [id number] Patient Account Number: [id number] Date of Birth/Gender: Treating RN: 11-30-1930 (88 y.o. [name] Primary Care Physician: [name] Other Clinician: Referring Physician: Treating Physician/Extender: Pauletta Browns in Treatment: 19 Education Assessment Education Provided To: Patient and Caregiver Education Topics Provided Pressure: Methods: Explain/Verbal, Printed Responses: State content correctly Wound/Skin Impairment: Methods: Explain/Verbal, Printed Responses: State content correctly Electronic Signature(s) Signed: 05/25/2022 6:00:15 PM By: [name] Signed: 05/25/2022 6:00:15 PM By: [name] Entered By: [name]  on 05/25/2022 09:18:34 -------------------------------------------------------------------------------- Wound Assessment Details Patient Name: Date of Service: [name], [name] 05/25/2022 9:30 A M Medical Record Number: [id number] Patient Account Number: [id number] Date of Birth/Sex: Treating RN: 07-22-1930 (88 y.o. [name] Primary Care Royann Wildasin: [name] Other Clinician: Referring Diona Peregoy: Treating Dametra Whetsel/Extender: Pauletta Browns in  Treatment: 19 Wound Status Wound Number: 2 Primary Etiology: Pressure Ulcer Wound Location: Sacrum Wound Status: Open Wounding Event: Pressure Injury Comorbid History: Cataracts, Arrhythmia Date Acquired: 12/02/2021 Weeks Of Treatment: 19 Clustered Wound: No Photos Wound Measurements Length: (cm) 3.4 Width: (cm) 2.2 Depth: (cm) 0.7 Area: (cm) 5.875 Volume: (cm) 4.112 % Reduction in Area: 88.5% % Reduction in Volume: 96.3% Epithelialization: Large (67-100%) Tunneling: No Undermining: Yes Starting Position (o'clock): 2 Ending Position (o'clock): 6 Maximum Distance: (cm) 2 Wound Description Classification: Category/Stage IV Wound Margin: Well defined, not attached Exudate Amount: Medium Exudate Type: Serosanguineous Exudate Color: red, brown Foul Odor After Cleansing: No Slough/Fibrino Yes Wound Bed Granulation Amount: Large (67-100%) Exposed Structure Granulation Quality: Red Fascia Exposed: No Necrotic Amount: Small (1-33%) Fat Layer (Subcutaneous Tissue) Exposed: Yes Necrotic Quality: Adherent Slough Tendon Exposed: Yes Muscle Exposed: No Joint Exposed: No Bone Exposed: No Treatment Notes Wound #2 (Sacrum) Cleanser Soap and Water Discharge Instruction: May shower and wash wound with dial antibacterial soap and water prior to dressing change. Peri-Wound Care Topical Primary Dressing Promogran Prisma Matrix, 4.34 (sq in) (silver collagen) Discharge Instruction: Moisten collagen with saline or hydrogel Secondary Dressing MPM [name] SAP Bordered Dressing, 7x6.7 (Sacral) (in/in) Discharge Instruction: Apply silicone border over primary dressing as directed. Woven Gauze Sponge, Non-Sterile 4x4 in Discharge Instruction: Apply saline moist gauze over Prisma Secured With Compression Wrap Compression Stockings Add-Ons Electronic Signature(s) Signed: 05/25/2022 6:00:15 PM By: [name] Entered By: [name] on 05/25/2022  09:38:20 -------------------------------------------------------------------------------- Vitals Details Patient Name: Date of Service: [name], Jerry Stakes C. 05/25/2022 9:30 A M Medical Record Number: [id number] Patient Account Number: [id number] Date of Birth/Sex: Treating RN: [birth year] (88 y.o. [name] Primary Care Kamiryn Bezanson: [name] Other Clinician: Referring Buelah Rennie: Treating Meri Pelot/Extender: Pauletta Browns in Treatment: 19 Vital Signs Time Taken: 09:30 Temperature (F): 97.6 Height (in): 73 Pulse (bpm): 57 Weight (lbs): 145 Respiratory Rate (breaths/min): 18 Body Mass Index (BMI): 19.1 Blood Pressure (mmHg): 91/53 Reference Range: 80 - 120 mg / dl Electronic Signature(s) Signed: 05/25/2022 6:00:15 PM By: [name] Entered By: [name] on 05/25/2022 09:31:35

## 2022-05-31 DIAGNOSIS — F4321 Adjustment disorder with depressed mood: Secondary | ICD-10-CM | POA: Diagnosis not present

## 2022-06-01 DIAGNOSIS — E039 Hypothyroidism, unspecified: Secondary | ICD-10-CM | POA: Diagnosis not present

## 2022-06-01 DIAGNOSIS — Z7689 Persons encountering health services in other specified circumstances: Secondary | ICD-10-CM | POA: Diagnosis not present

## 2022-06-10 ENCOUNTER — Ambulatory Visit (HOSPITAL_BASED_OUTPATIENT_CLINIC_OR_DEPARTMENT_OTHER): Payer: Medicare Other | Admitting: Internal Medicine

## 2022-06-15 ENCOUNTER — Encounter (HOSPITAL_BASED_OUTPATIENT_CLINIC_OR_DEPARTMENT_OTHER): Payer: Medicare Other | Admitting: Internal Medicine

## 2022-06-15 DIAGNOSIS — L89154 Pressure ulcer of sacral region, stage 4: Secondary | ICD-10-CM

## 2022-06-15 DIAGNOSIS — L080 Pyoderma: Secondary | ICD-10-CM | POA: Diagnosis not present

## 2022-06-15 DIAGNOSIS — E039 Hypothyroidism, unspecified: Secondary | ICD-10-CM | POA: Diagnosis not present

## 2022-06-15 DIAGNOSIS — K900 Celiac disease: Secondary | ICD-10-CM | POA: Diagnosis not present

## 2022-06-29 ENCOUNTER — Encounter (HOSPITAL_BASED_OUTPATIENT_CLINIC_OR_DEPARTMENT_OTHER): Payer: Medicare Other | Attending: Internal Medicine | Admitting: Internal Medicine

## 2022-06-29 DIAGNOSIS — E039 Hypothyroidism, unspecified: Secondary | ICD-10-CM | POA: Diagnosis not present

## 2022-06-29 DIAGNOSIS — K900 Celiac disease: Secondary | ICD-10-CM | POA: Diagnosis not present

## 2022-06-29 DIAGNOSIS — Z8546 Personal history of malignant neoplasm of prostate: Secondary | ICD-10-CM | POA: Insufficient documentation

## 2022-06-29 DIAGNOSIS — L89154 Pressure ulcer of sacral region, stage 4: Secondary | ICD-10-CM | POA: Diagnosis not present

## 2022-06-29 NOTE — Progress Notes (Signed)
TERRE, ZABRISKIE ([id number]) Visit Report for 06/29/2022 Arrival Information Details Patient Name: Date of Service: [name], [name] 06/29/2022 9:30 A M Medical Record Number: [id number] Patient Account Number: [id number] Date of Birth/Sex: Treating RN: [birth year] (88 y.o. [name] Primary Care Deejay Koppelman: [name] Other Clinician: Referring Yasenia Reedy: Treating Livvy Spilman/Extender: [name] in Treatment: 24 Visit Information History Since Last Visit Added or deleted any medications: No Patient Arrived: Wheel Chair Any new allergies or adverse reactions: No Arrival Time: 09:50 Had a fall or experienced change in No Accompanied By: son activities of daily living that may affect Transfer Assistance: Harrel Lemon Lift risk of falls: Patient Identification Verified: Yes Signs or symptoms of abuse/neglect since last visito No Secondary Verification Process Completed: Yes Hospitalized since last visit: No Patient Requires Transmission-Based Precautions: No Implantable device outside of the clinic excluding No Patient Has Alerts: Yes cellular tissue based products placed in the center Patient Alerts: Patient on Blood Thinner since last visit: R ABI non compressible Has Dressing in Place as Prescribed: Yes Pain Present Now: No Electronic Signature(s) Signed: 06/29/2022 5:27:29 PM By: [name] Entered By: [name] on 06/29/2022 09:51:22 -------------------------------------------------------------------------------- Clinic Level of Care Assessment Details Patient Name: Date of Service: [name], [name] 06/29/2022 9:30 A M Medical Record Number: [id number] Patient Account Number: [id number] Date of Birth/Sex: Treating RN: [birth year] (88 y.o. [name] Primary Care Markeeta Scalf: [name] Other Clinician: Referring Mykela Mewborn: Treating Azhia Siefken/Extender: [name] in Treatment: 24 Clinic Level of Care  Assessment Items TOOL 4 Quantity Score X- 1 0 Use when only an EandM is performed on FOLLOW-UP visit ASSESSMENTS - Nursing Assessment / Reassessment X- 1 10 Reassessment of Co-morbidities (includes updates in patient status) X- 1 5 Reassessment of Adherence to Treatment Plan ASSESSMENTS - Wound and Skin A ssessment / Reassessment X - Simple Wound Assessment / Reassessment - one wound 1 5 _0  - 0 Complex Wound Assessment / Reassessment - multiple wounds _1  - 0 Dermatologic / Skin Assessment (not related to wound area) ASSESSMENTS - Focused Assessment _2  - 0 Circumferential Edema Measurements - multi extremities _3  - 0 Nutritional Assessment / Counseling / Intervention _4  - 0 Lower Extremity Assessment (monofilament, tuning fork, pulses) _5  - 0 Peripheral Arterial Disease Assessment (using hand held doppler) ASSESSMENTS - Ostomy and/or Continence Assessment and Care _6  - 0 Incontinence Assessment and Management _7  - 0 Ostomy Care Assessment and Management (repouching, etc.) PROCESS - Coordination of Care _8  - 0 Simple Patient / Family Education for ongoing care X- 1 20 Complex (extensive) Patient / Family Education for ongoing care _9  - 0 Staff obtains Programmer, systems, Records, T Results / Process Orders est X- 1 10 Staff telephones HHA, Nursing Homes / Clarify orders / etc _10  - 0 Routine Transfer to another Facility (non-emergent condition) _11  - 0 Routine Hospital Admission (non-emergent condition) _12  - 0 New Admissions / Biomedical engineer / Ordering NPWT Apligraf, etc. , _13  - 0 Emergency Hospital Admission (emergent condition) _14  - 0 Simple Discharge Coordination _15  - 0 Complex (extensive) Discharge Coordination PROCESS - Special Needs _16  - 0 Pediatric / Minor Patient Management _17  - 0 Isolation Patient Management _18  - 0 Hearing / Language / Visual special needs _19  - 0 Assessment of Community assistance (transportation, D/C planning, etc.) _20  -  0 Additional assistance / Altered mentation _21  - 0 Support Surface(s) Assessment (bed, cushion, seat, etc.) INTERVENTIONS - Wound Cleansing / Measurement X - Simple Wound Cleansing - one wound  1 5 _0  - 0 Complex Wound Cleansing - multiple wounds X- 1 5 Wound Imaging (photographs - any number of wounds) _1  - 0 Wound Tracing (instead of photographs) X- 1 5 Simple Wound Measurement - one wound _2  - 0 Complex Wound Measurement - multiple wounds INTERVENTIONS - Wound Dressings _3  - 0 Small Wound Dressing one or multiple wounds X- 1 15 Medium Wound Dressing one or multiple wounds _4  - 0 Large Wound Dressing one or multiple wounds <HQIONGEXBMWUXLKG>_4<\/WNUUVOZDGUYQIHKV>_4  - 0 Application of Medications - topical <QVZDGLOVFIEPPIRJ>_1<\/OACZYSAYTKZSWFUX>_3  - 0 Application of Medications - injection INTERVENTIONS - Miscellaneous _7  - 0 External ear exam _8  - 0 Specimen Collection (cultures, biopsies, blood, body fluids, etc.) _9  - 0 Specimen(s) / Culture(s) sent or taken to Lab for analysis _10  - 0 Patient Transfer (multiple staff / Civil Service fast streamer / Similar devices) _11  - 0 Simple Staple / Suture removal (25 or less) _12  - 0 Complex Staple / Suture removal (26 or more) _13  - 0 Hypo / Hyperglycemic Management (close monitor of Blood Glucose) _14  - 0 Ankle / Brachial Index (ABI) - do not check if billed separately X- 1 5 Vital Signs Has the patient been seen at the hospital within the last three years: Yes Total Score: 85 Level Of Care: New/Established - Level 3 Electronic Signature(s) Signed: 06/29/2022 5:27:29 PM By: [name] Entered By: [name] on 06/29/2022 10:06:05 -------------------------------------------------------------------------------- Complex / Palliative Patient Assessment Details Patient Name: Date of Service: [name] 06/29/2022 9:30 A M Medical Record Number: [id number] Patient Account Number: [id number] Date of Birth/Sex: Treating RN: 21-Apr-1930 (88 y.o. [name] Primary Care Raihan Kimmel: [name] Other  Clinician: Referring Verba Ainley: Treating Marcellina Jonsson/Extender: [name] in Treatment: 24 Complex Wound Management Criteria Patient has remarkable or complex co-morbidities requiring medications or treatments that extend wound healing times. Examples: Diabetes mellitus with chronic renal failure or end stage renal disease requiring dialysis Advanced or poorly controlled rheumatoid arthritis Diabetes mellitus and end stage chronic obstructive pulmonary disease Active cancer with current chemo- or radiation therapy A fib, Debility, Immobility Palliative Wound Management Criteria Care Approach Wound Care Plan: Complex Wound Management Electronic Signature(s) Signed: 06/29/2022 2:06:16 PM By: [name] DO Signed: 06/29/2022 5:27:29 PM By: [name] Entered By: [name] on 06/29/2022 10:01:27 -------------------------------------------------------------------------------- Encounter Discharge Information Details Patient Name: Date of Service: [name], Jerry Stakes C. 06/29/2022 9:30 A M Medical Record Number: [id number] Patient Account Number: [id number] Date of Birth/Sex: Treating RN: January 06, 1930 (88 y.o. [name] Primary Care Tahnee Cifuentes: [name] Other Clinician: Referring Amery Minasyan: Treating Akul Leggette/Extender: [name] in Treatment: 24 Encounter Discharge Information Items Discharge Condition: Stable Ambulatory Status: Wheelchair Discharge Destination: Skilled Nursing Facility Orders Sent: Yes Transportation: Private Auto Accompanied By: son Schedule Follow-up Appointment: Yes Clinical Summary of Care: Provided on 06/29/2022 Form Type Recipient Paper Patient Patient Electronic Signature(s) Signed: 06/29/2022 5:27:29 PM By: [name] Entered By: [name] on 06/29/2022 10:20:48 -------------------------------------------------------------------------------- Lower Extremity Assessment Details Patient  Name: Date of Service: [name] 06/29/2022 9:30 A M Medical Record Number: [id number] Patient Account Number: [id number] Date of Birth/Sex: Treating RN: 1930/03/15 (88 y.o. [name] Primary Care March Joos: [name] Other Clinician: Referring Lane Kjos: Treating Augusten Lipkin/Extender: [name] in Treatment: 24 Electronic Signature(s) Signed: 06/29/2022 5:27:29 PM By: [name] Entered By: [name] on 06/29/2022 09:51:39 -------------------------------------------------------------------------------- Multi Wound Chart Details Patient Name: Date of Service: [name], Toran C. 06/29/2022 9:30 A M Medical Record Number:  [id number] Patient Account Number: [id number] Date of Birth/Sex: Treating RN: Dec 11, 1930 (88 y.o. [name] Primary Care Jacek Colson: [name] Other Clinician: Referring Moe Graca: Treating Mizraim Harmening/Extender: [name] in Treatment: 24 Vital Signs Height(in): 73 Pulse(bpm): 35 Weight(lbs): 145 Blood Pressure(mmHg): 7/56 Body Mass Index(BMI): 19.1 Temperature(F): 97.7 Respiratory Rate(breaths/min): 16 Photos: [2:No Photos Sacrum] [N/A:N/A N/A] Wound Location: [2:Pressure Injury] [N/A:N/A] Wounding Event: [2:Pressure Ulcer] [N/A:N/A] Primary Etiology: [2:Cataracts, Arrhythmia] [N/A:N/A] Comorbid History: [2:12/02/2021] [N/A:N/A] Date Acquired: [2:24] [N/A:N/A] Weeks of Treatment: [2:Open] [N/A:N/A] Wound Status: [2:No] [N/A:N/A] Wound Recurrence: [2:2.5x1.5x1] [N/A:N/A] Measurements L x W x D (cm) [2:2.945] [N/A:N/A] A (cm) : rea [2:2.945] [N/A:N/A] Volume (cm) : [2:94.20%] [N/A:N/A] % Reduction in A rea: [2:97.40%] [N/A:N/A] % Reduction in Volume: [2:6] Starting Position 1 (o'clock): [2:12] Ending Position 1 (o'clock): [2:1.6] Maximum Distance 1 (cm): [2:Yes] [N/A:N/A] Undermining: [2:Category/Stage IV] [N/A:N/A] Classification: [2:Medium] [N/A:N/A] Exudate A  mount: [2:Serosanguineous] [N/A:N/A] Exudate Type: [2:red, brown] [N/A:N/A] Exudate Color: [2:Well defined, not attached] [N/A:N/A] Wound Margin: [2:Large (67-100%)] [N/A:N/A] Granulation Amount: [2:Red] [N/A:N/A] Granulation Quality: [2:Small (1-33%)] [N/A:N/A] Necrotic Amount: [2:Fat Layer (Subcutaneous Tissue): Yes N/A] Exposed Structures: [2:Tendon: Yes Fascia: No Muscle: No Joint: No Bone: No Large (67-100%)] [N/A:N/A] Treatment Notes Wound #2 (Sacrum) Cleanser Soap and Water Discharge Instruction: May shower and wash wound with dial antibacterial soap and water prior to dressing change. Peri-Wound Care Topical Keystone Antibiotic Compound Primary Dressing Secondary Dressing MPM [name] SAP Bordered Dressing, 7x6.7 (Sacral) (in/in) Discharge Instruction: Apply silicone border over primary dressing as directed. Woven Gauze Sponge, Non-Sterile 4x4 in Discharge Instruction: Soak gauze with Keystone and pack into wound Secured With Compression Wrap Compression Stockings Add-Ons Electronic Signature(s) Signed: 06/29/2022 2:06:16 PM By: [name] DO Signed: 06/29/2022 5:27:29 PM By: [name] Entered By: [name] on 06/29/2022 12:21:01 -------------------------------------------------------------------------------- Multi-Disciplinary Care Plan Details Patient Name: Date of Service: [name], [name]. 06/29/2022 9:30 A M Medical Record Number: [id number] Patient Account Number: [id number] Date of Birth/Sex: Treating RN: November 09, 1930 (88 y.o. [name] Primary Care Shaleena Crusoe: [name] Other Clinician: Referring Natallia Stellmach: Treating Akari Crysler/Extender: [name] in Treatment: 24 Multidisciplinary Care Plan reviewed with physician Active Inactive Pressure Nursing Diagnoses: Knowledge deficit related to causes and risk factors for pressure ulcer development Knowledge deficit related to management of pressures  ulcers Potential for impaired tissue integrity related to pressure, friction, moisture, and shear Goals: Patient/caregiver will verbalize risk factors for pressure ulcer development Date Initiated: 01/07/2022 Date Inactivated: 06/15/2022 Target Resolution Date: 07/13/2022 Goal Status: Met Patient/caregiver will verbalize understanding of pressure ulcer management Date Initiated: 01/07/2022 Target Resolution Date: 07/13/2022 Goal Status: Active Interventions: Assess: immobility, friction, shearing, incontinence upon admission and as needed Assess offloading mechanisms upon admission and as needed Assess potential for pressure ulcer upon admission and as needed Provide education on pressure ulcers Notes: 03/11/22: Pressure relief ongoing. Wound/Skin Impairment Nursing Diagnoses: Impaired tissue integrity Knowledge deficit related to ulceration/compromised skin integrity Goals: Patient/caregiver will verbalize understanding of skin care regimen Date Initiated: 01/07/2022 Target Resolution Date: 07/13/2022 Goal Status: Active Ulcer/skin breakdown will have a volume reduction of 30% by week 4 Date Initiated: 01/07/2022 Date Inactivated: 03/11/2022 Target Resolution Date: 03/11/2022 Goal Status: Met Ulcer/skin breakdown will have a volume reduction of 50% by week 8 Date Initiated: 03/11/2022 Target Resolution Date: 05/13/2022 Goal Status: Active Interventions: Assess patient/caregiver ability to obtain necessary supplies Assess patient/caregiver ability to perform ulcer/skin care regimen upon admission and as needed Assess ulceration(s) every visit Provide education on ulcer and skin care  Notes: Electronic Signature(s) Signed: 06/29/2022 5:27:29 PM By: [name] Entered By: [name] on 06/29/2022 09:52:51 -------------------------------------------------------------------------------- Pain Assessment Details Patient Name: Date of Service: JARNELL, CORDARO 06/29/2022 9:30 A  M Medical Record Number: [id number] Patient Account Number: [id number] Date of Birth/Sex: Treating RN: Feb 13, 1930 (88 y.o. [name] Primary Care Ankith Edmonston: [name] Other Clinician: Referring Jowana Thumma: Treating Lorre Opdahl/Extender: [name] in Treatment: 24 Active Problems Location of Pain Severity and Description of Pain Patient Has Paino No Site Locations Pain Management and Medication Current Pain Management: Electronic Signature(s) Signed: 06/29/2022 5:27:29 PM By: [name] Entered By: [name] on 06/29/2022 09:51:33 -------------------------------------------------------------------------------- Patient/Caregiver Education Details Patient Name: Date of Service: [name] 7/11/2023andnbsp9:30 A M Medical Record Number: [id number] Patient Account Number: [id number] Date of Birth/Gender: Treating RN: Dec 23, 1929 (88 y.o. [name] Primary Care Physician: [name] Other Clinician: Referring Physician: Treating Physician/Extender: [name] in Treatment: 24 Education Assessment Education Provided To: Patient and Caregiver Education Topics Provided Pressure: Methods: Explain/Verbal, Printed Responses: State content correctly Wound/Skin Impairment: Methods: Explain/Verbal, Printed Responses: State content correctly Electronic Signature(s) Signed: 06/29/2022 5:27:29 PM By: [name] Entered By: [name] on 06/29/2022 09:53:10 -------------------------------------------------------------------------------- Wound Assessment Details Patient Name: Date of Service: [name] 06/29/2022 9:30 A M Medical Record Number: [id number] Patient Account Number: [id number] Date of Birth/Sex: Treating RN: 1930/03/20 (88 y.o. [name] Primary Care Jurgen Groeneveld: [name] Other Clinician: Referring Hever Castilleja: Treating Hawa Henly/Extender: [name] in Treatment: 24 Wound Status Wound Number: 2 Primary Etiology: Pressure Ulcer Wound Location: Sacrum Wound Status: Open Wounding Event: Pressure Injury Comorbid History: Cataracts, Arrhythmia Date Acquired: 12/02/2021 Weeks Of Treatment: 24 Clustered Wound: No Wound Measurements Length: (cm) 2.5 Width: (cm) 1.5 Depth: (cm) 1 Area: (cm) 2.945 Volume: (cm) 2.945 % Reduction in Area: 94.2% % Reduction in Volume: 97.4% Epithelialization: Large (67-100%) Tunneling: No Undermining: Yes Starting Position (o'clock): 6 Ending Position (o'clock): 12 Maximum Distance: (cm) 1.6 Wound Description Classification: Category/Stage IV Wound Margin: Well defined, not attached Exudate Amount: Medium Exudate Type: Serosanguineous Exudate Color: red, brown Foul Odor After Cleansing: No Slough/Fibrino Yes Wound Bed Granulation Amount: Large (67-100%) Exposed Structure Granulation Quality: Red Fascia Exposed: No Necrotic Amount: Small (1-33%) Fat Layer (Subcutaneous Tissue) Exposed: Yes Necrotic Quality: Adherent Slough Tendon Exposed: Yes Muscle Exposed: No Joint Exposed: No Bone Exposed: No Treatment Notes Wound #2 (Sacrum) Cleanser Soap and Water Discharge Instruction: May shower and wash wound with dial antibacterial soap and water prior to dressing change. Peri-Wound Care Topical Keystone Antibiotic Compound Primary Dressing Secondary Dressing MPM [name] SAP Bordered Dressing, 7x6.7 (Sacral) (in/in) Discharge Instruction: Apply silicone border over primary dressing as directed. Woven Gauze Sponge, Non-Sterile 4x4 in Discharge Instruction: Soak gauze with Keystone and pack into wound Secured With Compression Wrap Compression Stockings Add-Ons Electronic Signature(s) Signed: 06/29/2022 5:27:29 PM By: [name] Entered By: [name] on 06/29/2022 09:52:24 -------------------------------------------------------------------------------- Vitals  Details Patient Name: Date of Service: [name], Jerry Stakes C. 06/29/2022 9:30 A M Medical Record Number: [id number] Patient Account Number: [id number] Date of Birth/Sex: Treating RN: 1930-07-26 (88 y.o. [name] Primary Care Edras Wilford: [name] Other Clinician: Referring Sondos Wolfman: Treating Naliah Eddington/Extender: [name] in Treatment: 24 Vital Signs Time Taken: 10:33 Temperature (F): 97.7 Height (in): 73 Pulse (bpm): 57 Weight (lbs): 145 Respiratory Rate (breaths/min): 16 Body Mass Index (BMI): 19.1 Blood Pressure (mmHg): 95/56 Reference Range: 80 - 120 mg / dl Electronic  Signature(s) Signed: 06/29/2022 10:34:18 AM By: Erenest Blank Entered By: Erenest Blank on 06/29/2022 10:33:34

## 2022-06-29 NOTE — Progress Notes (Signed)
[name] ([id number]) Visit Report for 06/29/2022 Chief Complaint Document Details Patient Name: Date of Service: [name], [name] 06/29/2022 9:30 A M Medical Record Number: [id number] Patient Account Number: [id number] Date of Birth/Sex: Treating RN: [birth year] (88 y.o. [name] Primary Care Provider: [name] Other Clinician: Referring Provider: Treating Provider/Extender: [name] in Treatment: 24 Information Obtained from: Patient Chief Complaint Sacral and left heel pressure ulcers Electronic Signature(s) Signed: 06/29/2022 2:06:16 PM By: [name] DO Entered By: [name] on 06/29/2022 12:21:10 -------------------------------------------------------------------------------- HPI Details Patient Name: Date of Service: [name], [name] 06/29/2022 9:30 A M Medical Record Number: [id number] Patient Account Number: [id number] Date of Birth/Sex: Treating RN: 1930-04-11 (88 y.o. [name] Primary Care Provider: [name] Other Clinician: Referring Provider: Treating Provider/Extender: [name] in Treatment: 24 History of Present Illness HPI Description: Admission 01/07/2022 [name] is an 88 year old male with a past medical history of prostate cancer, hypothyroidism and celiac disease that presents to the clinic for a 1 month history of ulcers to his sacrum and right heel. He was admitted to the hospital on 12/05/2021 for rhabdomyolysis secondary to being on the floor for 2 days without being able to get up. He developed a pressure ulcer to his sacrum and right heel. In the hospital they were doing hydrotherapy. He was discharged on 12/27 to a skilled nursing facility. He has been using wet-to-dry dressings with Santyl to the sacrum and Betadine to the right heel wound. He currently denies systemic signs of infection. He has Prevalon boots and a group 2 air mattress. He has a  hard time offloading the sacrum and is on his back most of the day. 2/2; patient presents for follow-up. Santyl has been used to the right heel wound and Dakin's wet-to-dry dressings to the sacral wound. He states he had imaging done to both wound bed areas. He has no issues or complaints today. He denies signs of infection. 2/16; patient presents for follow-up. He is using Dakin's to the sacral wound and Santyl to the left heel wound. He has no issues or complaints today. He denies signs of infection. Unfortunately he is unable to offload the sacral wound bed. He either sits in the bed all day or he is in a wheelchair. 3/2; patient presents for follow-up. He has been using Dakin's wet-to-dry dressing to the sacral wound and Hydrofera Blue to the left heel wound. He has no issues or complaints today. He denies signs of infection. 3/23; patient presents for follow-up. He continues to use Dakin's wet-to-dry dressings to the sacral wound and Hydrofera Blue to the heel wound. The heel wound is closed. Unfortunately we have never been able to get the x-ray results of the sacrum ordered several clinic visits ago. 4/6; patient presents for follow-up. He has been using Dakin's wet-to-dry dressings to the sacral wound. He obtained his x-ray of the sacrum. He has no issues or complaints today. 4/20; patient presents for follow-up. He continues to use Dakin's wet-to-dry dressings to the sacral wound. We have not received the x-ray results. He has no issues or complaints today. He denies signs of infection. 5/4; patient presents for follow-up. He continues to use Dakin's wet-to-dry dressings to the sacral wound. Apparently The facility he resides in did another Portable x-ray of the sacrum and results showed no acute osseous abnormalities with no evidence of bony destruction to suggest osteomyelitis. Patient reports today that he may be moving to  [facility] as he has been [facility]. This may be his last appointment. 5/22; patient presents for follow-up. Has been using Dakin's wet-to-dry dressings to the sacral wound. [facility] the [facility] has [facility] through. He still resides at his previous facility. No plans for moving at this time. He would like to try the wound VAC. 6/6; 2-week follow-up. He has been using Dakin's wet-to-dry. He still does not have a wound VAC but we have a note from the facility suggesting that they are now closed and being able to obtain it. He uses an air mattress in his bed. He spends most of the time in bed and his son thinks that pressure relief is fairly good. Not exactly certain about nutritional intake however. 6/27; patient presents for follow-up. He still does not have a wound VAC. Facility is working on getting it. He has been using collagen with normal saline wet-to- dry backing. He reports aggressive offloading. He currently denies signs of infection. 7/11; patient presents for follow-up. We had a wound culture done at last clinic visit that showed high levels of E. coli, Enterococcus bacillus and Proteus mirabilis. Keystone antibiotics were ordered and patient has been using this daily to the wound bed. He has no issues or complaints today. He denies signs of infection. Electronic Signature(s) Signed: 06/29/2022 2:06:16 PM By: [name] DO Entered By: [name] on 06/29/2022 12:22:07 -------------------------------------------------------------------------------- Physical Exam Details Patient Name: Date of Service: [name] 06/29/2022 9:30 A M Medical Record Number: [id number] Patient Account Number: [id number] Date of Birth/Sex: Treating RN: March 16, 1930 (88 y.o. [name] Primary Care Provider: [name] Other Clinician: Referring Provider: Treating Provider/Extender: [name] in Treatment: 24 Constitutional respirations regular,  non-labored and within target range for patient.Marland Kitchen Psychiatric pleasant and cooperative. Notes Sacrum: Open wound with significant undermining from 1-7 o'clock. This does not probe to bone. No surrounding signs of infection. Electronic Signature(s) Signed: 06/29/2022 2:06:16 PM By: [name] DO Entered By: [name] on 06/29/2022 12:22:32 -------------------------------------------------------------------------------- Physician Orders Details Patient Name: Date of Service: [name] 06/29/2022 9:30 A M Medical Record Number: [id number] Patient Account Number: [id number] Date of Birth/Sex: Treating RN: June 17, 1930 (88 y.o. [name] Primary Care Provider: [name] Other Clinician: Referring Provider: Treating Provider/Extender: [name] in Treatment: 24 Verbal / Phone Orders: No Diagnosis Coding ICD-10 Coding Code Description L89.154 Pressure ulcer of sacral region, stage 4 E03.9 Hypothyroidism, unspecified Follow-up Appointments ppointment in 2 weeks. - 07/15/22 @ 9:30p Dr. Heber [facility], Room7) *Harrel Lemon* Return A Bathing/ Shower/ Hygiene Other Bathing/Shower/Hygiene Orders/Instructions: - May have a sponge bath or shower Negative Presssure Wound Therapy Wound Vac to wound continuously at 170m/hg pressure - Change vac dressing 3x week. Black and White Foam combination - Apply Prisma silver collagen then white foam to undermining from 2:00 to 6:00 then black foam over Prisma. May bridge to hip area. Apply skin prep prior to applying drape and apply drape around periwound to protect skin. Edema Control - Lymphedema / SCD / Other Right Lower Extremity Elevate legs to the level of the heart or above for 30 minutes daily and/or when sitting, a frequency of: - Throughout the day Moisturize legs daily. Off-Loading Heel suspension boot to: - Prevalon Boots to bilateral feet Low air-loss mattress (Group 2) Gel wheelchair  cushion Turn and reposition every 2 hours Additional Orders / Instructions Follow Nutritious  Diet - High protein for wound healing Wound Treatment Wound #2 - Sacrum Cleanser: Soap and Water 1 x Per XKG/81 Days Discharge Instructions: May shower and wash wound with dial antibacterial soap and water prior to dressing change. Topical: Keystone Antibiotic Compound 1 x Per Day/15 Days Secondary Dressing: MPM [name] SAP Bordered Dressing, 7x6.7 (Sacral) (in/in) 1 x Per Day/15 Days Discharge Instructions: Apply silicone border over primary dressing as directed. Secondary Dressing: Woven Gauze Sponge, Non-Sterile 4x4 in 1 x Per Day/15 Days Discharge Instructions: Soak gauze with Keystone and pack into wound Electronic Signature(s) Signed: 06/29/2022 2:06:16 PM By: [name] DO Entered By: [name] on 06/29/2022 12:22:44 -------------------------------------------------------------------------------- Problem List Details Patient Name: Date of Service: [name] 06/29/2022 9:30 A M Medical Record Number: [id number] Patient Account Number: [id number] Date of Birth/Sex: Treating RN: 1930/11/25 (88 y.o. [name] Primary Care Provider: [name] Other Clinician: Referring Provider: Treating Provider/Extender: [name] in Treatment: 24 Active Problems ICD-10 Encounter Code Description Active Date MDM Diagnosis L89.154 Pressure ulcer of sacral region, stage 4 01/07/2022 No Yes E03.9 Hypothyroidism, unspecified 01/07/2022 No Yes Inactive Problems Resolved Problems ICD-10 Code Description Active Date Resolved Date L89.610 Pressure ulcer of right heel, unstageable 01/07/2022 01/07/2022 Electronic Signature(s) Signed: 06/29/2022 2:06:16 PM By: [name] DO Entered By: [name] on 06/29/2022 12:20:55 -------------------------------------------------------------------------------- Progress Note Details Patient Name: Date  of Service: [name] 06/29/2022 9:30 A M Medical Record Number: [id number] Patient Account Number: [id number] Date of Birth/Sex: Treating RN: August 25, 1930 (88 y.o. [name] Primary Care Provider: [name] Other Clinician: Referring Provider: Treating Provider/Extender: [name] in Treatment: 24 Subjective Chief Complaint Information obtained from Patient Sacral and left heel pressure ulcers History of Present Illness (HPI) Admission 01/07/2022 Mr. Jerry Paynter is an 88 year old male with a past medical history of prostate cancer, hypothyroidism and celiac disease that presents to the clinic for a 1 month history of ulcers to his sacrum and right heel. He was admitted to the hospital on 12/05/2021 for rhabdomyolysis secondary to being on the floor for 2 days without being able to get up. He developed a pressure ulcer to his sacrum and right heel. In the hospital they were doing hydrotherapy. He was discharged on 12/27 to a skilled nursing facility. He has been using wet-to-dry dressings with Santyl to the sacrum and Betadine to the right heel wound. He currently denies systemic signs of infection. He has Prevalon boots and a group 2 air mattress. He has a hard time offloading the sacrum and is on his back most of the day. 2/2; patient presents for follow-up. Santyl has been used to the right heel wound and Dakin's wet-to-dry dressings to the sacral wound. He states he had imaging done to both wound bed areas. He has no issues or complaints today. He denies signs of infection. 2/16; patient presents for follow-up. He is using Dakin's to the sacral wound and Santyl to the left heel wound. He has no issues or complaints today. He denies signs of infection. Unfortunately he is unable to offload the sacral wound bed. He either sits in the bed all day or he is in a wheelchair. 3/2; patient presents for follow-up. He has been using Dakin's wet-to-dry  dressing to the sacral wound and Hydrofera Blue to the left heel wound. He has no issues or complaints today. He denies signs of infection. 3/23; patient presents for follow-up. He continues to use Dakin's wet-to-dry dressings  to the sacral wound and Hydrofera Blue to the heel wound. The heel wound is closed. Unfortunately we have never been able to get the x-ray results of the sacrum ordered several clinic visits ago. 4/6; patient presents for follow-up. He has been using Dakin's wet-to-dry dressings to the sacral wound. He obtained his x-ray of the sacrum. He has no issues or complaints today. 4/20; patient presents for follow-up. He continues to use Dakin's wet-to-dry dressings to the sacral wound. We have not received the x-ray results. He has no issues or complaints today. He denies signs of infection. 5/4; patient presents for follow-up. He continues to use Dakin's wet-to-dry dressings to the sacral wound. Apparently The facility he resides in did another Portable x-ray of the sacrum and results showed no acute osseous abnormalities with no evidence of bony destruction to suggest osteomyelitis. Patient reports today that he may be moving to [facility] as [facility] This may be his last appointment. 5/22; patient presents for follow-up. Has been using Dakin's wet-to-dry dressings to the sacral wound. [facility] a [facility] the [facility] has fallen through. He still resides at his previous facility. No plans for moving at this time. He would like to try the wound VAC. 6/6; 2-week follow-up. He has been using Dakin's wet-to-dry. He still does not have a wound VAC but we have a note from the facility suggesting that they are now closed and being able to obtain it. He uses an air mattress in his bed. He spends most of the time in bed and his son thinks that pressure relief is fairly good. Not exactly certain about nutritional  intake however. 6/27; patient presents for follow-up. He still does not have a wound VAC. Facility is working on getting it. He has been using collagen with normal saline wet-to- dry backing. He reports aggressive offloading. He currently denies signs of infection. 7/11; patient presents for follow-up. We had a wound culture done at last clinic visit that showed high levels of E. coli, Enterococcus bacillus and Proteus mirabilis. Keystone antibiotics were ordered and patient has been using this daily to the wound bed. He has no issues or complaints today. He denies signs of infection. Patient History Information obtained from Patient. Family History Unknown History. Social History Never smoker, Marital Status - Widowed, Alcohol Use - Never, Drug Use - No History, Caffeine Use - Never. Medical History Eyes Patient has history of Cataracts - surgery on both eyes Denies history of Glaucoma, Optic Neuritis Cardiovascular Patient has history of Arrhythmia - A.Fib on Eliquis Denies history of Angina, Congestive Heart Failure, Coronary Artery Disease, Deep Vein Thrombosis, Hypertension, Hypotension, Myocardial Infarction, Peripheral Arterial Disease, Peripheral Venous Disease, Phlebitis, Vasculitis Endocrine Denies history of Type I Diabetes, Type II Diabetes Immunological Denies history of Lupus Erythematosus, Raynaudoos, Scleroderma Integumentary (Skin) Denies history of History of Burn Musculoskeletal Denies history of Gout, Rheumatoid Arthritis, Osteoarthritis, Osteomyelitis Neurologic Denies history of Dementia, Neuropathy, Quadriplegia, Paraplegia, Seizure Disorder Oncologic Denies history of Received Chemotherapy, Received Radiation Medical A Surgical History Notes nd Cardiovascular Hx Mitral regurgitation Endocrine Hx Hypothyroidism Genitourinary Recent UTI (had sepsis) CKD stage 2 Musculoskeletal Debility/Deconditioning Neurologic Pt. c/o Nerve damage in 2 fingers on  Left hand (after fall) Oncologic Prostate cancer (had "seeds") Objective Constitutional respirations regular, non-labored and within target range for patient.. Vitals Time Taken: 10:33 AM, Height: 73 in, Weight: 145 lbs, BMI: 19.1, Temperature: 97.7 F, Pulse: 57 bpm, Respiratory  Rate: 16 breaths/min, Blood Pressure: 95/56 mmHg. Psychiatric pleasant and cooperative. General Notes: Sacrum: Open wound with significant undermining from 1-7 o'clock. This does not probe to bone. No surrounding signs of infection. Integumentary (Hair, Skin) Wound #2 status is Open. Original cause of wound was Pressure Injury. The date acquired was: 12/02/2021. The wound has been in treatment 24 weeks. The wound is located on the Sacrum. The wound measures 2.5cm length x 1.5cm width x 1cm depth; 2.945cm^2 area and 2.945cm^3 volume. There is tendon and Fat Layer (Subcutaneous Tissue) exposed. There is no tunneling noted, however, there is undermining starting at 6:00 and ending at 12:00 with a maximum distance of 1.6cm. There is a medium amount of serosanguineous drainage noted. The wound margin is well defined and not attached to the wound base. There is large (67-100%) red granulation within the wound bed. There is a small (1-33%) amount of necrotic tissue within the wound bed including Adherent Slough. Assessment Active Problems ICD-10 Pressure ulcer of sacral region, stage 4 Hypothyroidism, unspecified Patient's wound has shown improvement in size and appearance since last clinic visit. I recommended continuing Keystone antibiotic and aggressive offloading. At this time I recommended doing this treatment for the next month. We will hold off on the wound VAC. It has not been able to be obtained by the facility anyways. Plan Follow-up Appointments: Return Appointment in 2 weeks. - 07/15/22 @ 9:30p Dr. Heber [place] [facility], Room7) *Harrel Lemon* Bathing/ Shower/ Hygiene: Other Bathing/Shower/Hygiene Orders/Instructions: -  May have a sponge bath or shower Negative Presssure Wound Therapy: Wound Vac to wound continuously at 162m/hg pressure - Change vac dressing 3x week. Black and White Foam combination - Apply Prisma silver collagen then white foam to undermining from 2:00 to 6:00 then black foam over Prisma. May bridge to hip area. Apply skin prep prior to applying drape and apply drape around periwound to protect skin. Edema Control - Lymphedema / SCD / Other: Elevate legs to the level of the heart or above for 30 minutes daily and/or when sitting, a frequency of: - Throughout the day Moisturize legs daily. Off-Loading: Heel suspension boot to: - Prevalon Boots to bilateral feet Low air-loss mattress (Group 2) Gel wheelchair cushion Turn and reposition every 2 hours Additional Orders / Instructions: Follow Nutritious Diet - High protein for wound healing WOUND #2: - Sacrum Wound Laterality: Cleanser: Soap and Water 1 x Per Day/15 Days Discharge Instructions: May shower and wash wound with dial antibacterial soap and water prior to dressing change. Topical: Keystone Antibiotic Compound 1 x Per Day/15 Days Secondary Dressing: MPM [name] SAP Bordered Dressing, 7x6.7 (Sacral) (in/in) 1 x Per Day/15 Days Discharge Instructions: Apply silicone border over primary dressing as directed. Secondary Dressing: Woven Gauze Sponge, Non-Sterile 4x4 in 1 x Per Day/15 Days Discharge Instructions: Soak gauze with Keystone and pack into wound 1. Continue Keystone antibiotic 2. Aggressive offloading 3. Follow-up in 2 weeks Electronic Signature(s) Signed: 06/29/2022 2:06:16 PM By: HKalman ShanDO Entered By: HKalman Shanon 06/29/2022 12:24:28 -------------------------------------------------------------------------------- HxROS Details Patient Name: Date of Service: [name] CFloyce StakesC. 06/29/2022 9:30 A M Medical Record Number: [id number] Account Number: [id number] of Birth/Sex: Treating  RN: [id number](88y.o. MMarcheta GrammesPrimary Care Provider: JScarlette CalicoOther Clinician: Referring Provider: Treating Provider/Extender: HJerl Santosin Treatment: 24 Information Obtained From Patient Eyes Medical History: Positive for: Cataracts - surgery on both eyes Negative for: Glaucoma; Optic Neuritis Cardiovascular Medical History: Positive for: Arrhythmia - A.Fib on Eliquis Negative  for: Angina; Congestive Heart Failure; Coronary Artery Disease; Deep Vein Thrombosis; Hypertension; Hypotension; Myocardial Infarction; Peripheral Arterial Disease; Peripheral Venous Disease; Phlebitis; Vasculitis Past Medical History Notes: Hx Mitral regurgitation Endocrine Medical History: Negative for: Type I Diabetes; Type II Diabetes Past Medical History Notes: Hx Hypothyroidism Genitourinary Medical History: Past Medical History Notes: Recent UTI (had sepsis) CKD stage 2 Immunological Medical History: Negative for: Lupus Erythematosus; Raynauds; Scleroderma Integumentary (Skin) Medical History: Negative for: History of Burn Musculoskeletal Medical History: Negative for: Gout; Rheumatoid Arthritis; Osteoarthritis; Osteomyelitis Past Medical History Notes: Debility/Deconditioning Neurologic Medical History: Negative for: Dementia; Neuropathy; Quadriplegia; Paraplegia; Seizure Disorder Past Medical History Notes: Pt. c/o Nerve damage in 2 fingers on Left hand (after fall) Oncologic Medical History: Negative for: Received Chemotherapy; Received Radiation Past Medical History Notes: Prostate cancer (had "seeds") HBO Extended History Items Eyes: Cataracts Immunizations Pneumococcal Vaccine: Received Pneumococcal Vaccination: Yes Received Pneumococcal Vaccination On or After 60th Birthday: Yes Implantable Devices No devices added Family and Social History Unknown History: Yes; Never smoker; Marital Status - Widowed; Alcohol Use: Never; Drug  Use: No History; Caffeine Use: Never; Financial Concerns: No; Food, Clothing or Shelter Needs: No; Support System Lacking: No; Transportation Concerns: No Electronic Signature(s) Signed: 06/29/2022 2:06:16 PM By: [name] DO Signed: 06/29/2022 5:27:29 PM By: [name] Entered By: [name] on 06/29/2022 12:22:14 -------------------------------------------------------------------------------- SuperBill Details Patient Name: Date of Service: [name] 06/29/2022 Medical Record Number: [id number] Patient Account Number: [id number] Date of Birth/Sex: Treating RN: 1930/04/12 (88 y.o. [name] Primary Care Provider: [name] Other Clinician: Referring Provider: Treating Provider/Extender: [name] in Treatment: 24 Diagnosis Coding ICD-10 Codes Code Description [phone] Pressure ulcer of sacral region, stage 4 E03.9 Hypothyroidism, unspecified Facility Procedures CPT4 Code: [id number] Description: 82423 - WOUND CARE VISIT-LEV 3 EST PT Modifier: Quantity: 1 Physician Procedures : CPT4 Code Description Modifier [id number] - WC PHYS LEVEL 3 - EST PT ICD-10 Diagnosis Description L89.154 Pressure ulcer of sacral region, stage 4 E03.9 Hypothyroidism, unspecified Quantity: 1 Electronic Signature(s) Signed: 06/29/2022 2:06:16 PM By: [name] DO Entered By: [name] on 06/29/2022 12:29:57

## 2022-07-12 DIAGNOSIS — F4321 Adjustment disorder with depressed mood: Secondary | ICD-10-CM | POA: Diagnosis not present

## 2022-07-15 ENCOUNTER — Encounter (HOSPITAL_BASED_OUTPATIENT_CLINIC_OR_DEPARTMENT_OTHER): Payer: Medicare Other | Admitting: Internal Medicine

## 2022-07-15 DIAGNOSIS — E039 Hypothyroidism, unspecified: Secondary | ICD-10-CM

## 2022-07-15 DIAGNOSIS — K900 Celiac disease: Secondary | ICD-10-CM | POA: Diagnosis not present

## 2022-07-15 DIAGNOSIS — Z8546 Personal history of malignant neoplasm of prostate: Secondary | ICD-10-CM | POA: Diagnosis not present

## 2022-07-15 DIAGNOSIS — L89154 Pressure ulcer of sacral region, stage 4: Secondary | ICD-10-CM | POA: Diagnosis not present

## 2022-07-15 NOTE — Progress Notes (Signed)
[name] ([id number]) Visit Report for 07/15/2022 Chief Complaint Document Details Patient Name: Date of Service: [name], [name] 07/15/2022 9:30 A M Medical Record Number: [id number] Patient Account Number: [id number] Date of Birth/Sex: Treating RN: [birth year] (88 y.o. [name] Primary Care Provider: Scarlette [name] Other Clinician: Referring Provider: Treating Provider/Extender: [name] in Treatment: 27 Information Obtained from: Patient Chief Complaint Sacral and left heel pressure ulcers Electronic Signature(s) Signed: 07/15/2022 12:32:53 PM By: Jerry Shan DO Entered By: [name] on 07/15/2022 11:12:46 -------------------------------------------------------------------------------- HPI Details Patient Name: Date of Service: [name] 07/15/2022 9:30 A M Medical Record Number: [id number] Patient Account Number: [id number] Date of Birth/Sex: Treating RN: 1930/09/05 (88 y.o. [name] Primary Care Provider: Scarlette [name] Other Clinician: Referring Provider: Treating Provider/Extender: [name] in Treatment: 27 History of Present Illness HPI Description: Admission 01/07/2022 [name] is an 88 year old male with a past medical history of prostate cancer, hypothyroidism and celiac disease that presents to the clinic for a 1 month history of ulcers to his sacrum and right heel. He was admitted to the hospital on 12/05/2021 for rhabdomyolysis secondary to being on the floor for 2 days without being able to get up. He developed a pressure ulcer to his sacrum and right heel. In the hospital they were doing hydrotherapy. He was discharged on 12/27 to a skilled nursing facility. He has been using wet-to-dry dressings with Santyl to the sacrum and Betadine to the right heel wound. He currently denies systemic signs of infection. He has Prevalon boots and a group 2 air mattress. He has a  hard time offloading the sacrum and is on his back most of the day. 2/2; patient presents for follow-up. Santyl has been used to the right heel wound and Dakin's wet-to-dry dressings to the sacral wound. He states he had imaging done to both wound bed areas. He has no issues or complaints today. He denies signs of infection. 2/16; patient presents for follow-up. He is using Dakin's to the sacral wound and Santyl to the left heel wound. He has no issues or complaints today. He denies signs of infection. Unfortunately he is unable to offload the sacral wound bed. He either sits in the bed all day or he is in a wheelchair. 3/2; patient presents for follow-up. He has been using Dakin's wet-to-dry dressing to the sacral wound and Hydrofera Blue to the left heel wound. He has no issues or complaints today. He denies signs of infection. 3/23; patient presents for follow-up. He continues to use Dakin's wet-to-dry dressings to the sacral wound and Hydrofera Blue to the heel wound. The heel wound is closed. Unfortunately we have never been able to get the x-ray results of the sacrum ordered several clinic visits ago. 4/6; patient presents for follow-up. He has been using Dakin's wet-to-dry dressings to the sacral wound. He obtained his x-ray of the sacrum. He has no issues or complaints today. 4/20; patient presents for follow-up. He continues to use Dakin's wet-to-dry dressings to the sacral wound. We have not received the x-ray results. He has no issues or complaints today. He denies signs of infection. 5/4; patient presents for follow-up. He continues to use Dakin's wet-to-dry dressings to the sacral wound. Apparently The facility he resides in did another Portable x-ray of the sacrum and results showed no acute osseous abnormalities with no evidence of bony destruction to suggest osteomyelitis. Patient reports today that he may be moving to  [facility] as he has been [facility] This may be his last appointment. 5/22; patient presents for follow-up. Has been using Dakin's wet-to-dry dressings to the sacral wound. [facility]. He still resides at his previous facility. No plans for moving at this time. He would like to try the wound VAC. 6/6; 2-week follow-up. He has been using Dakin's wet-to-dry. He still does not have a wound VAC but we have a note from the facility suggesting that they are now closed and being able to obtain it. He uses an air mattress in his bed. He spends most of the time in bed and his son thinks that pressure relief is fairly good. Not exactly certain about nutritional intake however. 6/27; patient presents for follow-up. He still does not have a wound VAC. Facility is working on getting it. He has been using collagen with normal saline wet-to- dry backing. He reports aggressive offloading. He currently denies signs of infection. 7/11; patient presents for follow-up. We had a wound culture done at last clinic visit that showed high levels of E. coli, Enterococcus bacillus and Proteus mirabilis. Keystone antibiotics were ordered and patient has been using this daily to the wound bed. He has no issues or complaints today. He denies signs of infection. 7/27; patient presents for follow-up. He has been using Keystone antibiotics. Son is present and helps provide the history. He states that the patient is on his back most of the day in the bed. He is not able to aggressively offload the area. Patient currently denies signs of infection. Electronic Signature(s) Signed: 07/15/2022 12:32:53 PM By: Jerry Shan DO Entered By: [name] on 07/15/2022 11:13:18 -------------------------------------------------------------------------------- Physical Exam Details Patient Name: Date of Service: [name] 07/15/2022 9:30 A M Medical Record Number: [id number] Patient  Account Number: [id number] Date of Birth/Sex: Treating RN: 12/08/1930 (88 y.o. [name] Primary Care Provider: Scarlette [name] Other Clinician: Referring Provider: Treating Provider/Extender: [name] in Treatment: 27 Constitutional respirations regular, non-labored and within target range for patient.Marland Kitchen Psychiatric pleasant and cooperative. Notes Sacrum: Open wound with undermining from the 17 o'clock position. No probing to bone. Granulation tissue throughout the wound bed. No signs of surrounding infection. Electronic Signature(s) Signed: 07/15/2022 12:32:53 PM By: Jerry Shan DO Entered By: [name] on 07/15/2022 11:13:52 -------------------------------------------------------------------------------- Physician Orders Details Patient Name: Date of Service: [name] 07/15/2022 9:30 A M Medical Record Number: [id number] Patient Account Number: [id number] Date of Birth/Sex: Treating RN: 12/02/1930 (88 y.o. [name] Primary Care Provider: Scarlette [name] Other Clinician: Referring Provider: Treating Provider/Extender: [name] in Treatment: 27 Verbal / Phone Orders: No Diagnosis Coding ICD-10 Coding Code Description L89.154 Pressure ulcer of sacral region, stage 4 E03.9 Hypothyroidism, unspecified Follow-up Appointments ppointment in 2 weeks. - 07/29/22 @ 9:30am Dr. Heber [place] [facility], Room 7) *Harrel Lemon* Return A Bathing/ Shower/ Hygiene Other Bathing/Shower/Hygiene Orders/Instructions: - May have a sponge bath or shower Negative Presssure Wound Therapy Wound Vac to wound continuously at 153m/hg pressure - Change vac dressing 3x week. Black and White Foam combination - Apply white foam to undermining from 7:00 to 11:00 then black foam. May bridge to hip area. Apply skin prep prior to applying drape and apply drape around periwound to protect skin. Edema Control - Lymphedema / SCD / Other Right  Lower Extremity Elevate legs to the level of the heart  or above for 30 minutes daily and/or when sitting, a frequency of: - Throughout the day Moisturize legs daily. Off-Loading Heel suspension boot to: - Prevalon Boots to bilateral feet Low air-loss mattress (Group 2) Gel wheelchair cushion Turn and reposition every 2 hours Additional Orders / Instructions Follow Nutritious Diet - High protein for wound healing Wound Treatment Wound #2 - Sacrum Cleanser: Soap and Water 1 x Per Day/15 Days Discharge Instructions: May shower and wash wound with dial antibacterial soap and water prior to dressing change. Topical: Keystone Antibiotic Compound 1 x Per Day/15 Days Secondary Dressing: MPM [name] SAP Bordered Dressing, 7x6.7 (Sacral) (in/in) 1 x Per Day/15 Days Discharge Instructions: Apply silicone border over primary dressing as directed. Secondary Dressing: Woven Gauze Sponge, Non-Sterile 4x4 in 1 x Per Day/15 Days Discharge Instructions: Soak gauze with Keystone and pack into wound Electronic Signature(s) Signed: 07/15/2022 12:32:53 PM By: Jerry Shan DO Entered By: [name] on 07/15/2022 11:13:59 -------------------------------------------------------------------------------- Problem List Details Patient Name: Date of Service: [name] 07/15/2022 9:30 A M Medical Record Number: [id number] Patient Account Number: [id number] Date of Birth/Sex: Treating RN: 30-Oct-1930 (88 y.o. [name] Primary Care Provider: Scarlette [name] Other Clinician: Referring Provider: Treating Provider/Extender: [name] in Treatment: 27 Active Problems ICD-10 Encounter Code Description Active Date MDM Diagnosis L89.154 Pressure ulcer of sacral region, stage 4 01/07/2022 No Yes E03.9 Hypothyroidism, unspecified 01/07/2022 No Yes Inactive Problems Resolved Problems ICD-10 Code Description Active Date Resolved Date L89.610 Pressure ulcer of right  heel, unstageable 01/07/2022 01/07/2022 Electronic Signature(s) Signed: 07/15/2022 12:32:53 PM By: Jerry Shan DO Previous Signature: 07/15/2022 9:47:53 AM Version By: [name] Entered By: [name] on 07/15/2022 11:12:38 -------------------------------------------------------------------------------- Progress Note Details Patient Name: Date of Service: [name] 07/15/2022 9:30 A M Medical Record Number: [id number] Patient Account Number: [id number] Date of Birth/Sex: Treating RN: 1930-06-19 (88 y.o. [name] Primary Care Provider: Scarlette [name] Other Clinician: Referring Provider: Treating Provider/Extender: [name] in Treatment: 27 Subjective Chief Complaint Information obtained from Patient Sacral and left heel pressure ulcers History of Present Illness (HPI) Admission 01/07/2022 [name] is an 88 year old male with a past medical history of prostate cancer, hypothyroidism and celiac disease that presents to the clinic for a 1 month history of ulcers to his sacrum and right heel. He was admitted to the hospital on 12/05/2021 for rhabdomyolysis secondary to being on the floor for 2 days without being able to get up. He developed a pressure ulcer to his sacrum and right heel. In the hospital they were doing hydrotherapy. He was discharged on 12/27 to a skilled nursing facility. He has been using wet-to-dry dressings with Santyl to the sacrum and Betadine to the right heel wound. He currently denies systemic signs of infection. He has Prevalon boots and a group 2 air mattress. He has a hard time offloading the sacrum and is on his back most of the day. 2/2; patient presents for follow-up. Santyl has been used to the right heel wound and Dakin's wet-to-dry dressings to the sacral wound. He states he had imaging done to both wound bed areas. He has no issues or complaints today. He denies signs of infection. 2/16; patient  presents for follow-up. He is using Dakin's to the sacral wound and Santyl to the left heel wound. He has no issues or complaints today. He denies signs of infection. Unfortunately he is unable to offload the sacral wound bed. He either sits  in the bed all day or he is in a wheelchair. 3/2; patient presents for follow-up. He has been using Dakin's wet-to-dry dressing to the sacral wound and Hydrofera Blue to the left heel wound. He has no issues or complaints today. He denies signs of infection. 3/23; patient presents for follow-up. He continues to use Dakin's wet-to-dry dressings to the sacral wound and Hydrofera Blue to the heel wound. The heel wound is closed. Unfortunately we have never been able to get the x-ray results of the sacrum ordered several clinic visits ago. 4/6; patient presents for follow-up. He has been using Dakin's wet-to-dry dressings to the sacral wound. He obtained his x-ray of the sacrum. He has no issues or complaints today. 4/20; patient presents for follow-up. He continues to use Dakin's wet-to-dry dressings to the sacral wound. We have not received the x-ray results. He has no issues or complaints today. He denies signs of infection. 5/4; patient presents for follow-up. He continues to use Dakin's wet-to-dry dressings to the sacral wound. Apparently The facility he resides in did another Portable x-ray of the sacrum and results showed no acute osseous abnormalities with no evidence of bony destruction to suggest osteomyelitis. Patient reports today that he may be moving to [facility] as [facility] This may be his last appointment. 5/22; patient presents for follow-up. Has been using Dakin's wet-to-dry dressings to the sacral wound. [facility] has [facility] through. He still resides at his previous facility. No plans for moving at this time. He would like to try the wound VAC. 6/6;  2-week follow-up. He has been using Dakin's wet-to-dry. He still does not have a wound VAC but we have a note from the facility suggesting that they are now closed and being able to obtain it. He uses an air mattress in his bed. He spends most of the time in bed and his son thinks that pressure relief is fairly good. Not exactly certain about nutritional intake however. 6/27; patient presents for follow-up. He still does not have a wound VAC. Facility is working on getting it. He has been using collagen with normal saline wet-to- dry backing. He reports aggressive offloading. He currently denies signs of infection. 7/11; patient presents for follow-up. We had a wound culture done at last clinic visit that showed high levels of E. coli, Enterococcus bacillus and Proteus mirabilis. Keystone antibiotics were ordered and patient has been using this daily to the wound bed. He has no issues or complaints today. He denies signs of infection. 7/27; patient presents for follow-up. He has been using Keystone antibiotics. Son is present and helps provide the history. He states that the patient is on his back most of the day in the bed. He is not able to aggressively offload the area. Patient currently denies signs of infection. Patient History Information obtained from Patient. Family History Unknown History. Social History Never smoker, Marital Status - Widowed, Alcohol Use - Never, Drug Use - No History, Caffeine Use - Never. Medical History Eyes Patient has history of Cataracts - surgery on both eyes Denies history of Glaucoma, Optic Neuritis Cardiovascular Patient has history of Arrhythmia - A.Fib on Eliquis Denies history of Angina, Congestive Heart Failure, Coronary Artery Disease, Deep Vein Thrombosis, Hypertension, Hypotension, Myocardial Infarction, Peripheral Arterial Disease, Peripheral Venous Disease, Phlebitis, Vasculitis Endocrine Denies history of Type I Diabetes, Type II  Diabetes Immunological Denies history of Lupus Erythematosus,  Raynaudoos, Scleroderma Integumentary (Skin) Denies history of History of Burn Musculoskeletal Denies history of Gout, Rheumatoid Arthritis, Osteoarthritis, Osteomyelitis Neurologic Denies history of Dementia, Neuropathy, Quadriplegia, Paraplegia, Seizure Disorder Oncologic Denies history of Received Chemotherapy, Received Radiation Medical A Surgical History Notes nd Cardiovascular Hx Mitral regurgitation Endocrine Hx Hypothyroidism Genitourinary Recent UTI (had sepsis) CKD stage 2 Musculoskeletal Debility/Deconditioning Neurologic Pt. c/o Nerve damage in 2 fingers on Left hand (after fall) Oncologic Prostate cancer (had "seeds") Objective Constitutional respirations regular, non-labored and within target range for patient.. Vitals Time Taken: 10:06 AM, Height: 73 in, Weight: 145 lbs, BMI: 19.1, Temperature: 98.1 F, Pulse: 65 bpm, Respiratory Rate: 18 breaths/min, Blood Pressure: 96/62 mmHg. Psychiatric pleasant and cooperative. General Notes: Sacrum: Open wound with undermining from the 1oo7 o'clock position. No probing to bone. Granulation tissue throughout the wound bed. No signs of surrounding infection. Integumentary (Hair, Skin) Wound #2 status is Open. Original cause of wound was Pressure Injury. The date acquired was: 12/02/2021. The wound has been in treatment 27 weeks. The wound is located on the Sacrum. The wound measures 2.5cm length x 1.6cm width x 1cm depth; 3.142cm^2 area and 3.142cm^3 volume. There is tendon and Fat Layer (Subcutaneous Tissue) exposed. There is undermining starting at 7:00 and ending at 11:00 with a maximum distance of 1.7cm. There is a medium amount of serosanguineous drainage noted. The wound margin is well defined and not attached to the wound base. There is large (67-100%) red granulation within the wound bed. There is a small (1-33%) amount of necrotic tissue within the  wound bed including Adherent Slough. Assessment Active Problems ICD-10 Pressure ulcer of sacral region, stage 4 Hypothyroidism, unspecified Patient has more granulation tissue present today. Undermining is less. No signs of surrounding infection. I recommended continuing Keystone.We will reach out to the facility again to see if they can start the wound VAC. This was ordered several weeks ago. Unfortunately the patient cannot aggressively offload the area. I did talk with the patient and son that this may not heal if he cannot offload. He also has low albumin and I recommended increasing protein in his diet. Overall patient is frail. Follow-up in 2 weeks. Plan Follow-up Appointments: Return Appointment in 2 weeks. - 07/29/22 @ 9:30am Dr. Heber [place] [facility], Room 7) *Harrel Lemon* Bathing/ Shower/ Hygiene: Other Bathing/Shower/Hygiene Orders/Instructions: - May have a sponge bath or shower Negative Presssure Wound Therapy: Wound Vac to wound continuously at 163m/hg pressure - Change vac dressing 3x week. Black and White Foam combination - Apply white foam to undermining from 7:00 to 11:00 then black foam. May bridge to hip area. Apply skin prep prior to applying drape and apply drape around periwound to protect skin. Edema Control - Lymphedema / SCD / Other: Elevate legs to the level of the heart or above for 30 minutes daily and/or when sitting, a frequency of: - Throughout the day Moisturize legs daily. Off-Loading: Heel suspension boot to: - Prevalon Boots to bilateral feet Low air-loss mattress (Group 2) Gel wheelchair cushion Turn and reposition every 2 hours Additional Orders / Instructions: Follow Nutritious Diet - High protein for wound healing WOUND #2: - Sacrum Wound Laterality: Cleanser: Soap and Water 1 x Per Day/15 Days Discharge Instructions: May shower and wash wound with dial antibacterial soap and water prior to dressing change. Topical: Keystone Antibiotic Compound 1 x Per Day/15  Days Secondary Dressing: MPM [name] SAP Bordered Dressing, 7x6.7 (Sacral) (in/in) 1 x Per Day/15 Days Discharge Instructions: Apply silicone border over primary dressing as directed.  Secondary Dressing: Woven Gauze Sponge, Non-Sterile 4x4 in 1 x Per Day/15 Days Discharge Instructions: Soak gauze with Keystone and pack into wound 1. Continue Keystone antibiotics 2. Aggressive offloading 3. Follow-up in 2 weeks Electronic Signature(s) Signed: 07/15/2022 12:32:53 PM By: Jerry Shan DO Entered By: [name] on 07/15/2022 11:16:18 -------------------------------------------------------------------------------- HxROS Details Patient Name: Date of Service: [name] 07/15/2022 9:30 A M Medical Record Number: [id number] Patient Account Number: [id number] Date of Birth/Sex: Treating RN: 09-14-1930 (88 y.o. [name] Primary Care Provider: Scarlette [name] Other Clinician: Referring Provider: Treating Provider/Extender: [name] in Treatment: 27 Information Obtained From Patient Eyes Medical History: Positive for: Cataracts - surgery on both eyes Negative for: Glaucoma; Optic Neuritis Cardiovascular Medical History: Positive for: Arrhythmia - A.Fib on Eliquis Negative for: Angina; Congestive Heart Failure; Coronary Artery Disease; Deep Vein Thrombosis; Hypertension; Hypotension; Myocardial Infarction; Peripheral Arterial Disease; Peripheral Venous Disease; Phlebitis; Vasculitis Past Medical History Notes: Hx Mitral regurgitation Endocrine Medical History: Negative for: Type I Diabetes; Type II Diabetes Past Medical History Notes: Hx Hypothyroidism Genitourinary Medical History: Past Medical History Notes: Recent UTI (had sepsis) CKD stage 2 Immunological Medical History: Negative for: Lupus Erythematosus; Raynauds; Scleroderma Integumentary (Skin) Medical History: Negative for: History of Burn Musculoskeletal Medical  History: Negative for: Gout; Rheumatoid Arthritis; Osteoarthritis; Osteomyelitis Past Medical History Notes: Debility/Deconditioning Neurologic Medical History: Negative for: Dementia; Neuropathy; Quadriplegia; Paraplegia; Seizure Disorder Past Medical History Notes: Pt. c/o Nerve damage in 2 fingers on Left hand (after fall) Oncologic Medical History: Negative for: Received Chemotherapy; Received Radiation Past Medical History Notes: Prostate cancer (had "seeds") HBO Extended History Items Eyes: Cataracts Immunizations Pneumococcal Vaccine: Received Pneumococcal Vaccination: Yes Received Pneumococcal Vaccination On or After 60th Birthday: Yes Implantable Devices No devices added Family and Social History Unknown History: Yes; Never smoker; Marital Status - Widowed; Alcohol Use: Never; Drug Use: No History; Caffeine Use: Never; Financial Concerns: No; Food, Clothing or Shelter Needs: No; Support System Lacking: No; Transportation Concerns: No Electronic Signature(s) Signed: 07/15/2022 12:32:53 PM By: Jerry Shan DO Signed: 07/15/2022 4:18:51 PM By: [name] Entered By: [name] on 07/15/2022 11:13:24 -------------------------------------------------------------------------------- SuperBill Details Patient Name: Date of Service: [name] 07/15/2022 Medical Record Number: [id number] Patient Account Number: [id number] Date of Birth/Sex: Treating RN: Mar 10, 1930 (88 y.o. [name] Primary Care Provider: Scarlette [name] Other Clinician: Referring Provider: Treating Provider/Extender: [name] in Treatment: 27 Diagnosis Coding ICD-10 Codes Code Description L89.154 Pressure ulcer of sacral region, stage 4 E03.9 Hypothyroidism, unspecified Facility Procedures CPT4 Code: [id number] Description: 57262 - WOUND CARE VISIT-LEV 3 EST PT Modifier: Quantity: 1 Physician Procedures : CPT4 Code Description Modifier [id number]  16384 - WC PHYS LEVEL 3 - EST PT ICD-10 Diagnosis Description L89.154 Pressure ulcer of sacral region, stage 4 E03.9 Hypothyroidism, unspecified Quantity: 1 Electronic Signature(s) Signed: 07/15/2022 12:32:53 PM By: Jerry Shan DO Entered By: [name] on 07/15/2022 11:16:32

## 2022-07-15 NOTE — Progress Notes (Addendum)
AXXEL, GUDE ([id number]) Visit Report for 07/15/2022 Arrival Information Details Patient Name: Date of Service: Jerry, [name] 07/15/2022 9:30 A M Medical Record Number: [id number] Patient Account Number: [id number] Date of Birth/Sex: Treating RN: Jun 12, 1930 (88 y.o. [name] Primary Care Virlan Kempker: [name] Other Clinician: Referring Shandria Clinch: Treating Rosary Filosa/Extender: [name] in Treatment: 27 Visit Information History Since Last Visit Added or deleted any medications: No Patient Arrived: Wheel Chair Any new allergies or adverse reactions: No Arrival Time: 10:05 Had a fall or experienced change in No Accompanied By: son activities of daily living that may affect Transfer Assistance: Harrel Lemon Lift risk of falls: Patient Identification Verified: Yes Signs or symptoms of abuse/neglect since last visito No Secondary Verification Process Completed: Yes Hospitalized since last visit: No Patient Requires Transmission-Based Precautions: No Implantable device outside of the clinic excluding No Patient Has Alerts: Yes cellular tissue based products placed in the center Patient Alerts: Patient on Blood Thinner since last visit: R ABI non compressible Has Dressing in Place as Prescribed: Yes Pain Present Now: No Electronic Signature(s) Signed: 07/15/2022 4:18:51 PM By: [name] Entered By: [name] on 07/15/2022 10:06:09 -------------------------------------------------------------------------------- Clinic Level of Care Assessment Details Patient Name: Date of Service: Jerry, [name] 07/15/2022 9:30 A M Medical Record Number: [id number] Patient Account Number: [id number] Date of Birth/Sex: Treating RN: [birth year] (88 y.o. [name] Primary Care Maddeline Roorda: [name] Other Clinician: Referring Rosene Pilling: Treating Marshel Golubski/Extender: [name] in Treatment: 27 Clinic Level of Care  Assessment Items TOOL 4 Quantity Score X- 1 0 Use when only an EandM is performed on FOLLOW-UP visit ASSESSMENTS - Nursing Assessment / Reassessment X- 1 10 Reassessment of Co-morbidities (includes updates in patient status) X- 1 5 Reassessment of Adherence to Treatment Plan ASSESSMENTS - Wound and Skin A ssessment / Reassessment X - Simple Wound Assessment / Reassessment - one wound 1 5 []  - 0 Complex Wound Assessment / Reassessment - multiple wounds []  - 0 Dermatologic / Skin Assessment (not related to wound area) ASSESSMENTS - Focused Assessment []  - 0 Circumferential Edema Measurements - multi extremities []  - 0 Nutritional Assessment / Counseling / Intervention []  - 0 Lower Extremity Assessment (monofilament, tuning fork, pulses) []  - 0 Peripheral Arterial Disease Assessment (using hand held doppler) ASSESSMENTS - Ostomy and/or Continence Assessment and Care []  - 0 Incontinence Assessment and Management []  - 0 Ostomy Care Assessment and Management (repouching, etc.) PROCESS - Coordination of Care []  - 0 Simple Patient / Family Education for ongoing care X- 1 20 Complex (extensive) Patient / Family Education for ongoing care X- 1 10 Staff obtains Programmer, systems, Records, T Results / Process Orders est X- 1 10 Staff telephones HHA, Nursing Homes / Clarify orders / etc []  - 0 Routine Transfer to another Facility (non-emergent condition) []  - 0 Routine Hospital Admission (non-emergent condition) []  - 0 New Admissions / Biomedical engineer / Ordering NPWT Apligraf, etc. , []  - 0 Emergency Hospital Admission (emergent condition) []  - 0 Simple Discharge Coordination []  - 0 Complex (extensive) Discharge Coordination PROCESS - Special Needs []  - 0 Pediatric / Minor Patient Management []  - 0 Isolation Patient Management []  - 0 Hearing / Language / Visual special needs []  - 0 Assessment of Community assistance (transportation, D/C planning, etc.) []  -  0 Additional assistance / Altered mentation []  - 0 Support Surface(s) Assessment (bed, cushion, seat, etc.) INTERVENTIONS - Wound Cleansing / Measurement X - Simple Wound Cleansing - one wound  1 5 []  - 0 Complex Wound Cleansing - multiple wounds X- 1 5 Wound Imaging (photographs - any number of wounds) []  - 0 Wound Tracing (instead of photographs) X- 1 5 Simple Wound Measurement - one wound []  - 0 Complex Wound Measurement - multiple wounds INTERVENTIONS - Wound Dressings []  - 0 Small Wound Dressing one or multiple wounds X- 1 15 Medium Wound Dressing one or multiple wounds []  - 0 Large Wound Dressing one or multiple wounds []  - 0 Application of Medications - topical []  - 0 Application of Medications - injection INTERVENTIONS - Miscellaneous []  - 0 External ear exam []  - 0 Specimen Collection (cultures, biopsies, blood, body fluids, etc.) []  - 0 Specimen(s) / Culture(s) sent or taken to Lab for analysis X- 1 10 Patient Transfer (multiple staff / Civil Service fast streamer / Similar devices) []  - 0 Simple Staple / Suture removal (25 or less) []  - 0 Complex Staple / Suture removal (26 or more) []  - 0 Hypo / Hyperglycemic Management (close monitor of Blood Glucose) []  - 0 Ankle / Brachial Index (ABI) - do not check if billed separately X- 1 5 Vital Signs Has the patient been seen at the hospital within the last three years: Yes Total Score: 105 Level Of Care: New/Established - Level 3 Electronic Signature(s) Signed: 07/15/2022 4:18:51 PM By: [name] Entered By: [name] on 07/15/2022 10:35:02 -------------------------------------------------------------------------------- Encounter Discharge Information Details Patient Name: Date of Service: [name], [name] 07/15/2022 9:30 A M Medical Record Number: [id number] Patient Account Number: [id number] Date of Birth/Sex: Treating RN: 1930-11-13 (88 y.o. [name] Primary Care Ramil Edgington: [name] Other  Clinician: Referring Meiya Wisler: Treating Danaka Llera/Extender: [name] in Treatment: 27 Encounter Discharge Information Items Discharge Condition: Stable Ambulatory Status: Wheelchair Discharge Destination: Skilled Nursing Facility Orders Sent: Yes Transportation: Other Accompanied By: son Schedule Follow-up Appointment: Yes Clinical Summary of Care: Provided on 07/15/2022 Form Type Recipient Paper Patient Patient Electronic Signature(s) Signed: 07/15/2022 4:18:51 PM By: [name] Entered By: [name] on 07/15/2022 10:47:50 -------------------------------------------------------------------------------- Lower Extremity Assessment Details Patient Name: Date of Service: Jerry, [name] 07/15/2022 9:30 A M Medical Record Number: [id number] Patient Account Number: [id number] Date of Birth/Sex: Treating RN: Jun 08, 1930 (88 y.o. [name] Primary Care Suzana Sohail: [name] Other Clinician: Referring Margareth Kanner: Treating Leaner Morici/Extender: [name] in Treatment: 27 Electronic Signature(s) Signed: 07/15/2022 4:18:51 PM By: [name] Entered By: [name] on 07/15/2022 10:06:38 -------------------------------------------------------------------------------- Multi Wound Chart Details Patient Name: Date of Service: [name] 07/15/2022 9:30 A M Medical Record Number: [id number] Patient Account Number: [id number] Date of Birth/Sex: Treating RN: 02/23/1930 (88 y.o. [name] Primary Care Donis Kotowski: [name] Other Clinician: Referring Calvin Chura: Treating Tayden Duran/Extender: [name] in Treatment: 27 Vital Signs Height(in): 73 Pulse(bpm): 65 Weight(lbs): 145 Blood Pressure(mmHg): 96/62 Body Mass Index(BMI): 19.1 Temperature(F): 98.1 Respiratory Rate(breaths/min): 18 Photos: [N/A:N/A] Sacrum N/A N/A Wound Location: Pressure Injury N/A N/A Wounding  Event: Pressure Ulcer N/A N/A Primary Etiology: Cataracts, Arrhythmia N/A N/A Comorbid History: 12/02/2021 N/A N/A Date Acquired: 89 N/A N/A Weeks of Treatment: Open N/A N/A Wound Status: No N/A N/A Wound Recurrence: 2.5x1.6x1 N/A N/A Measurements L x W x D (cm) 3.142 N/A N/A A (cm) : rea 3.142 N/A N/A Volume (cm) : 93.90% N/A N/A % Reduction in A rea: 97.20% N/A N/A % Reduction in Volume: 7 Starting Position 1 (o'clock): 11 Ending Position 1 (o'clock): 1.7 Maximum Distance 1 (cm): Yes  N/A N/A Undermining: Category/Stage IV N/A N/A Classification: Medium N/A N/A Exudate A mount: Serosanguineous N/A N/A Exudate Type: red, brown N/A N/A Exudate Color: Well defined, not attached N/A N/A Wound Margin: Large (67-100%) N/A N/A Granulation A mount: Red N/A N/A Granulation Quality: Small (1-33%) N/A N/A Necrotic A mount: Fat Layer (Subcutaneous Tissue): Yes N/A N/A Exposed Structures: Tendon: Yes Fascia: No Muscle: No Joint: No Bone: No Large (67-100%) N/A N/A Epithelialization: Treatment Notes Wound #2 (Sacrum) Cleanser Soap and Water Discharge Instruction: May shower and wash wound with dial antibacterial soap and water prior to dressing change. Peri-Wound Care Topical Keystone Antibiotic Compound Primary Dressing Secondary Dressing MPM [name] SAP Bordered Dressing, 7x6.7 (Sacral) (in/in) Discharge Instruction: Apply silicone border over primary dressing as directed. Woven Gauze Sponge, Non-Sterile 4x4 in Discharge Instruction: Soak gauze with Keystone and pack into wound Secured With Compression Wrap Compression Stockings Add-Ons Electronic Signature(s) Signed: 07/15/2022 12:32:53 PM By: [name] DO Signed: 07/15/2022 4:18:51 PM By: [name] Entered By: [name] on 07/15/2022 11:12:42 -------------------------------------------------------------------------------- [place] Details Patient Name: Date  of Service: [name] 07/15/2022 9:30 A M Medical Record Number: [id number] Patient Account Number: [id number] Date of Birth/Sex: Treating RN: 09/17/1930 (88 y.o. [name] Primary Care Jesi Jurgens: [name] Other Clinician: Referring Kazue Cerro: Treating Ayushi Pla/Extender: [name] in Treatment: [place] reviewed with physician Active Inactive Electronic Signature(s) Signed: 08/30/2022 1:06:16 PM By: Deon Pilling RN, BSN Signed: 09/02/2022 4:35:04 PM By: [name] Previous Signature: 08/30/2022 1:05:37 PM Version By: Deon Pilling RN, BSN Previous Signature: 07/15/2022 9:50:24 AM Version By: [name] Previous Signature: 07/15/2022 9:48:55 AM Version By: [name] Entered By: Deon Pilling on 08/30/2022 13:06:16 -------------------------------------------------------------------------------- Pain Assessment Details Patient Name: Date of Service: [name] 07/15/2022 9:30 A M Medical Record Number: [id number] Patient Account Number: [id number] Date of Birth/Sex: Treating RN: 02/25/1930 (88 y.o. [name] Primary Care Velvie Thomaston: [name] Other Clinician: Referring Ryelynn Guedea: Treating Katheen Aslin/Extender: [name] in Treatment: 27 Active Problems Location of Pain Severity and Description of Pain Patient Has Paino No Site Locations Pain Management and Medication Current Pain Management: Electronic Signature(s) Signed: 07/15/2022 4:18:51 PM By: [name] Entered By: [name] on 07/15/2022 10:06:32 -------------------------------------------------------------------------------- Patient/Caregiver Education Details Patient Name: Date of Service: [name] 7/27/2023andnbsp9:30 A M Medical Record Number: [id number] Patient Account Number: [id number] Date of Birth/Gender: Treating RN: Dec 07, 1930 (88 y.o. [name] Primary Care  Physician: [name] Other Clinician: Referring Physician: Treating Physician/Extender: [name] in Treatment: 27 Education Assessment Education Provided To: Patient and Caregiver Education Topics Provided Pressure: Methods: Explain/Verbal, Printed Responses: State content correctly Wound/Skin Impairment: Methods: Demonstration, Explain/Verbal, Printed Responses: State content correctly Electronic Signature(s) Signed: 07/15/2022 4:18:51 PM By: [name] Entered By: [name] on 07/15/2022 09:50:45 -------------------------------------------------------------------------------- Wound Assessment Details Patient Name: Date of Service: [name] 07/15/2022 9:30 A M Medical Record Number: [id number] Patient Account Number: [id number] Date of Birth/Sex: Treating RN: 06-Mar-1930 (88 y.o. [name] Primary Care Brandonlee Navis: [name] Other Clinician: Referring Epifania Littrell: Treating Oluwakemi Salsberry/Extender: [name] in Treatment: 27 Wound Status Wound Number: 2 Primary Etiology: Pressure Ulcer Wound Location: Sacrum Wound Status: Open Wounding Event: Pressure Injury Comorbid History: Cataracts, Arrhythmia Date Acquired: 12/02/2021 Weeks Of Treatment: 27 Clustered Wound: No Photos Wound Measurements Length: (cm) 2.5 Width: (cm) 1.6 Depth: (cm) 1 Area: (cm) 3.142 Volume: (cm) 3.142 % Reduction in Area:  93.9% % Reduction in Volume: 97.2% Epithelialization: Large (67-100%) Undermining: Yes Starting Position (o'clock): 7 Ending Position (o'clock): 11 Maximum Distance: (cm) 1.7 Wound Description Classification: Category/Stage IV Wound Margin: Well defined, not attached Exudate Amount: Medium Exudate Type: Serosanguineous Exudate Color: red, brown Foul Odor After Cleansing: No Slough/Fibrino Yes Wound Bed Granulation Amount: Large (67-100%) Exposed Structure Granulation Quality: Red Fascia  Exposed: No Necrotic Amount: Small (1-33%) Fat Layer (Subcutaneous Tissue) Exposed: Yes Necrotic Quality: Adherent Slough Tendon Exposed: Yes Muscle Exposed: No Joint Exposed: No Bone Exposed: No Electronic Signature(s) Signed: 07/15/2022 4:18:51 PM By: [name] Entered By: [name] on 07/15/2022 10:18:56 -------------------------------------------------------------------------------- Vitals Details Patient Name: Date of Service: [name], Jerry Stakes C. 07/15/2022 9:30 A M Medical Record Number: [id number] Patient Account Number: [id number] Date of Birth/Sex: Treating RN: July 21, 1930 (88 y.o. [name] Primary Care Hedaya Latendresse: [name] Other Clinician: Referring Roxsana Riding: Treating Keliyah Spillman/Extender: [name] in Treatment: 27 Vital Signs Time Taken: 10:06 Temperature (F): 98.1 Height (in): 73 Pulse (bpm): 65 Weight (lbs): 145 Respiratory Rate (breaths/min): 18 Body Mass Index (BMI): 19.1 Blood Pressure (mmHg): 96/62 Reference Range: 80 - 120 mg / dl Electronic Signature(s) Signed: 07/15/2022 4:18:51 PM By: [name] Entered By: [name] on 07/15/2022 10:06:26

## 2022-07-20 DIAGNOSIS — Z7901 Long term (current) use of anticoagulants: Secondary | ICD-10-CM | POA: Diagnosis not present

## 2022-07-20 DIAGNOSIS — L89154 Pressure ulcer of sacral region, stage 4: Secondary | ICD-10-CM | POA: Diagnosis not present

## 2022-07-20 DIAGNOSIS — E785 Hyperlipidemia, unspecified: Secondary | ICD-10-CM | POA: Diagnosis not present

## 2022-07-20 DIAGNOSIS — I4891 Unspecified atrial fibrillation: Secondary | ICD-10-CM | POA: Diagnosis not present

## 2022-07-28 DIAGNOSIS — I4891 Unspecified atrial fibrillation: Secondary | ICD-10-CM | POA: Diagnosis not present

## 2022-07-28 DIAGNOSIS — K5900 Constipation, unspecified: Secondary | ICD-10-CM | POA: Diagnosis not present

## 2022-07-28 DIAGNOSIS — Z681 Body mass index (BMI) 19 or less, adult: Secondary | ICD-10-CM | POA: Diagnosis not present

## 2022-07-28 DIAGNOSIS — E785 Hyperlipidemia, unspecified: Secondary | ICD-10-CM | POA: Diagnosis not present

## 2022-07-29 ENCOUNTER — Ambulatory Visit (HOSPITAL_BASED_OUTPATIENT_CLINIC_OR_DEPARTMENT_OTHER): Payer: Medicare Other | Admitting: Internal Medicine

## 2022-07-29 DIAGNOSIS — E039 Hypothyroidism, unspecified: Secondary | ICD-10-CM | POA: Diagnosis not present

## 2022-07-29 DIAGNOSIS — D539 Nutritional anemia, unspecified: Secondary | ICD-10-CM | POA: Diagnosis not present

## 2022-07-29 DIAGNOSIS — L89154 Pressure ulcer of sacral region, stage 4: Secondary | ICD-10-CM | POA: Diagnosis not present

## 2022-07-29 DIAGNOSIS — E785 Hyperlipidemia, unspecified: Secondary | ICD-10-CM | POA: Diagnosis not present

## 2022-07-30 DIAGNOSIS — D469 Myelodysplastic syndrome, unspecified: Secondary | ICD-10-CM | POA: Diagnosis not present

## 2022-07-30 DIAGNOSIS — N181 Chronic kidney disease, stage 1: Secondary | ICD-10-CM | POA: Diagnosis not present

## 2022-07-30 DIAGNOSIS — E559 Vitamin D deficiency, unspecified: Secondary | ICD-10-CM | POA: Diagnosis not present

## 2022-08-02 DIAGNOSIS — R1310 Dysphagia, unspecified: Secondary | ICD-10-CM | POA: Diagnosis not present

## 2022-08-03 DIAGNOSIS — R1310 Dysphagia, unspecified: Secondary | ICD-10-CM | POA: Diagnosis not present

## 2022-08-04 DIAGNOSIS — R1310 Dysphagia, unspecified: Secondary | ICD-10-CM | POA: Diagnosis not present

## 2022-08-06 DIAGNOSIS — L89154 Pressure ulcer of sacral region, stage 4: Secondary | ICD-10-CM | POA: Diagnosis not present

## 2022-08-06 DIAGNOSIS — E785 Hyperlipidemia, unspecified: Secondary | ICD-10-CM | POA: Diagnosis not present

## 2022-08-06 DIAGNOSIS — D539 Nutritional anemia, unspecified: Secondary | ICD-10-CM | POA: Diagnosis not present

## 2022-08-06 DIAGNOSIS — R1310 Dysphagia, unspecified: Secondary | ICD-10-CM | POA: Diagnosis not present

## 2022-08-06 DIAGNOSIS — E039 Hypothyroidism, unspecified: Secondary | ICD-10-CM | POA: Diagnosis not present

## 2022-08-09 DIAGNOSIS — F4321 Adjustment disorder with depressed mood: Secondary | ICD-10-CM | POA: Diagnosis not present

## 2022-08-09 DIAGNOSIS — R1310 Dysphagia, unspecified: Secondary | ICD-10-CM | POA: Diagnosis not present

## 2022-08-10 DIAGNOSIS — R1310 Dysphagia, unspecified: Secondary | ICD-10-CM | POA: Diagnosis not present

## 2022-08-10 DIAGNOSIS — E039 Hypothyroidism, unspecified: Secondary | ICD-10-CM | POA: Diagnosis not present

## 2022-08-10 DIAGNOSIS — D539 Nutritional anemia, unspecified: Secondary | ICD-10-CM | POA: Diagnosis not present

## 2022-08-10 DIAGNOSIS — E785 Hyperlipidemia, unspecified: Secondary | ICD-10-CM | POA: Diagnosis not present

## 2022-08-10 DIAGNOSIS — L89154 Pressure ulcer of sacral region, stage 4: Secondary | ICD-10-CM | POA: Diagnosis not present

## 2022-08-18 DIAGNOSIS — D539 Nutritional anemia, unspecified: Secondary | ICD-10-CM | POA: Diagnosis not present

## 2022-08-18 DIAGNOSIS — E039 Hypothyroidism, unspecified: Secondary | ICD-10-CM | POA: Diagnosis not present

## 2022-08-18 DIAGNOSIS — L89154 Pressure ulcer of sacral region, stage 4: Secondary | ICD-10-CM | POA: Diagnosis not present

## 2022-08-18 DIAGNOSIS — E785 Hyperlipidemia, unspecified: Secondary | ICD-10-CM | POA: Diagnosis not present

## 2022-08-24 ENCOUNTER — Telehealth: Payer: Self-pay | Admitting: *Deleted

## 2022-08-24 NOTE — Patient Outreach (Signed)
  Care Coordination   08/24/2022 Name: [name] MRN: [id number] DOB: September 19, 1930   Care Coordination Outreach Attempts:  An unsuccessful telephone outreach was attempted today to offer the patient information about available care coordination services as a benefit of their health plan.   Follow Up Plan:  Additional outreach attempts will be made to offer the patient care coordination information and services.   Encounter Outcome:  No Answer  Care Coordination Interventions Activated:  No   Care Coordination Interventions:  No, not indicated    Eduard Clos MSW, LCSW Licensed Clinical Social Worker      [phone]

## 2022-08-26 DIAGNOSIS — E039 Hypothyroidism, unspecified: Secondary | ICD-10-CM | POA: Diagnosis not present

## 2022-08-26 DIAGNOSIS — L89154 Pressure ulcer of sacral region, stage 4: Secondary | ICD-10-CM | POA: Diagnosis not present

## 2022-08-26 DIAGNOSIS — D539 Nutritional anemia, unspecified: Secondary | ICD-10-CM | POA: Diagnosis not present

## 2022-08-26 DIAGNOSIS — E785 Hyperlipidemia, unspecified: Secondary | ICD-10-CM | POA: Diagnosis not present

## 2022-08-31 DIAGNOSIS — D539 Nutritional anemia, unspecified: Secondary | ICD-10-CM | POA: Diagnosis not present

## 2022-08-31 DIAGNOSIS — E039 Hypothyroidism, unspecified: Secondary | ICD-10-CM | POA: Diagnosis not present

## 2022-08-31 DIAGNOSIS — E785 Hyperlipidemia, unspecified: Secondary | ICD-10-CM | POA: Diagnosis not present

## 2022-08-31 DIAGNOSIS — L89154 Pressure ulcer of sacral region, stage 4: Secondary | ICD-10-CM | POA: Diagnosis not present

## 2022-09-08 DIAGNOSIS — E039 Hypothyroidism, unspecified: Secondary | ICD-10-CM | POA: Diagnosis not present

## 2022-09-08 DIAGNOSIS — E785 Hyperlipidemia, unspecified: Secondary | ICD-10-CM | POA: Diagnosis not present

## 2022-09-08 DIAGNOSIS — D539 Nutritional anemia, unspecified: Secondary | ICD-10-CM | POA: Diagnosis not present

## 2022-09-08 DIAGNOSIS — L89154 Pressure ulcer of sacral region, stage 4: Secondary | ICD-10-CM | POA: Diagnosis not present

## 2022-09-15 DIAGNOSIS — E038 Other specified hypothyroidism: Secondary | ICD-10-CM | POA: Diagnosis not present

## 2022-09-15 DIAGNOSIS — E559 Vitamin D deficiency, unspecified: Secondary | ICD-10-CM | POA: Diagnosis not present

## 2022-09-15 DIAGNOSIS — E785 Hyperlipidemia, unspecified: Secondary | ICD-10-CM | POA: Diagnosis not present

## 2022-09-15 DIAGNOSIS — E119 Type 2 diabetes mellitus without complications: Secondary | ICD-10-CM | POA: Diagnosis not present

## 2022-09-15 DIAGNOSIS — E039 Hypothyroidism, unspecified: Secondary | ICD-10-CM | POA: Diagnosis not present

## 2022-09-15 DIAGNOSIS — E02 Subclinical iodine-deficiency hypothyroidism: Secondary | ICD-10-CM | POA: Diagnosis not present

## 2022-09-15 DIAGNOSIS — L89154 Pressure ulcer of sacral region, stage 4: Secondary | ICD-10-CM | POA: Diagnosis not present

## 2022-09-15 DIAGNOSIS — D539 Nutritional anemia, unspecified: Secondary | ICD-10-CM | POA: Diagnosis not present

## 2022-09-16 DIAGNOSIS — K5900 Constipation, unspecified: Secondary | ICD-10-CM | POA: Diagnosis not present

## 2022-09-16 DIAGNOSIS — I4891 Unspecified atrial fibrillation: Secondary | ICD-10-CM | POA: Diagnosis not present

## 2022-09-16 DIAGNOSIS — Z681 Body mass index (BMI) 19 or less, adult: Secondary | ICD-10-CM | POA: Diagnosis not present

## 2022-09-16 DIAGNOSIS — E785 Hyperlipidemia, unspecified: Secondary | ICD-10-CM | POA: Diagnosis not present

## 2022-09-16 DIAGNOSIS — R52 Pain, unspecified: Secondary | ICD-10-CM | POA: Diagnosis not present

## 2022-09-16 DIAGNOSIS — E559 Vitamin D deficiency, unspecified: Secondary | ICD-10-CM | POA: Diagnosis not present

## 2022-09-16 DIAGNOSIS — E039 Hypothyroidism, unspecified: Secondary | ICD-10-CM | POA: Diagnosis not present

## 2022-09-24 DIAGNOSIS — D539 Nutritional anemia, unspecified: Secondary | ICD-10-CM | POA: Diagnosis not present

## 2022-09-24 DIAGNOSIS — E039 Hypothyroidism, unspecified: Secondary | ICD-10-CM | POA: Diagnosis not present

## 2022-09-24 DIAGNOSIS — E785 Hyperlipidemia, unspecified: Secondary | ICD-10-CM | POA: Diagnosis not present

## 2022-09-24 DIAGNOSIS — L89154 Pressure ulcer of sacral region, stage 4: Secondary | ICD-10-CM | POA: Diagnosis not present

## 2022-09-29 DIAGNOSIS — E785 Hyperlipidemia, unspecified: Secondary | ICD-10-CM | POA: Diagnosis not present

## 2022-09-29 DIAGNOSIS — L89154 Pressure ulcer of sacral region, stage 4: Secondary | ICD-10-CM | POA: Diagnosis not present

## 2022-09-29 DIAGNOSIS — D539 Nutritional anemia, unspecified: Secondary | ICD-10-CM | POA: Diagnosis not present

## 2022-09-29 DIAGNOSIS — E039 Hypothyroidism, unspecified: Secondary | ICD-10-CM | POA: Diagnosis not present

## 2022-10-05 DIAGNOSIS — E785 Hyperlipidemia, unspecified: Secondary | ICD-10-CM | POA: Diagnosis not present

## 2022-10-05 DIAGNOSIS — D539 Nutritional anemia, unspecified: Secondary | ICD-10-CM | POA: Diagnosis not present

## 2022-10-05 DIAGNOSIS — L89154 Pressure ulcer of sacral region, stage 4: Secondary | ICD-10-CM | POA: Diagnosis not present

## 2022-10-05 DIAGNOSIS — E039 Hypothyroidism, unspecified: Secondary | ICD-10-CM | POA: Diagnosis not present

## 2022-10-13 DIAGNOSIS — E039 Hypothyroidism, unspecified: Secondary | ICD-10-CM | POA: Diagnosis not present

## 2022-10-13 DIAGNOSIS — E785 Hyperlipidemia, unspecified: Secondary | ICD-10-CM | POA: Diagnosis not present

## 2022-10-13 DIAGNOSIS — L89154 Pressure ulcer of sacral region, stage 4: Secondary | ICD-10-CM | POA: Diagnosis not present

## 2022-10-13 DIAGNOSIS — D539 Nutritional anemia, unspecified: Secondary | ICD-10-CM | POA: Diagnosis not present

## 2022-10-20 DIAGNOSIS — L89154 Pressure ulcer of sacral region, stage 4: Secondary | ICD-10-CM | POA: Diagnosis not present

## 2022-10-20 DIAGNOSIS — E039 Hypothyroidism, unspecified: Secondary | ICD-10-CM | POA: Diagnosis not present

## 2022-10-20 DIAGNOSIS — E785 Hyperlipidemia, unspecified: Secondary | ICD-10-CM | POA: Diagnosis not present

## 2022-10-20 DIAGNOSIS — D539 Nutritional anemia, unspecified: Secondary | ICD-10-CM | POA: Diagnosis not present

## 2022-10-22 DIAGNOSIS — K900 Celiac disease: Secondary | ICD-10-CM | POA: Diagnosis not present

## 2022-10-22 DIAGNOSIS — I4891 Unspecified atrial fibrillation: Secondary | ICD-10-CM | POA: Diagnosis not present

## 2022-10-22 DIAGNOSIS — E559 Vitamin D deficiency, unspecified: Secondary | ICD-10-CM | POA: Diagnosis not present

## 2022-10-22 DIAGNOSIS — E785 Hyperlipidemia, unspecified: Secondary | ICD-10-CM | POA: Diagnosis not present

## 2022-10-22 DIAGNOSIS — K5900 Constipation, unspecified: Secondary | ICD-10-CM | POA: Diagnosis not present

## 2022-10-22 DIAGNOSIS — L89154 Pressure ulcer of sacral region, stage 4: Secondary | ICD-10-CM | POA: Diagnosis not present

## 2022-10-22 DIAGNOSIS — E039 Hypothyroidism, unspecified: Secondary | ICD-10-CM | POA: Diagnosis not present

## 2022-10-22 DIAGNOSIS — R52 Pain, unspecified: Secondary | ICD-10-CM | POA: Diagnosis not present

## 2022-10-22 DIAGNOSIS — Z681 Body mass index (BMI) 19 or less, adult: Secondary | ICD-10-CM | POA: Diagnosis not present

## 2022-11-03 DIAGNOSIS — L89154 Pressure ulcer of sacral region, stage 4: Secondary | ICD-10-CM | POA: Diagnosis not present

## 2022-11-03 DIAGNOSIS — E785 Hyperlipidemia, unspecified: Secondary | ICD-10-CM | POA: Diagnosis not present

## 2022-11-03 DIAGNOSIS — D539 Nutritional anemia, unspecified: Secondary | ICD-10-CM | POA: Diagnosis not present

## 2022-11-03 DIAGNOSIS — E039 Hypothyroidism, unspecified: Secondary | ICD-10-CM | POA: Diagnosis not present

## 2022-11-10 DIAGNOSIS — L89154 Pressure ulcer of sacral region, stage 4: Secondary | ICD-10-CM | POA: Diagnosis not present

## 2022-11-10 DIAGNOSIS — E039 Hypothyroidism, unspecified: Secondary | ICD-10-CM | POA: Diagnosis not present

## 2022-11-10 DIAGNOSIS — E785 Hyperlipidemia, unspecified: Secondary | ICD-10-CM | POA: Diagnosis not present

## 2022-11-10 DIAGNOSIS — D539 Nutritional anemia, unspecified: Secondary | ICD-10-CM | POA: Diagnosis not present

## 2022-11-17 DIAGNOSIS — L89154 Pressure ulcer of sacral region, stage 4: Secondary | ICD-10-CM | POA: Diagnosis not present

## 2022-11-17 DIAGNOSIS — E039 Hypothyroidism, unspecified: Secondary | ICD-10-CM | POA: Diagnosis not present

## 2022-11-17 DIAGNOSIS — E785 Hyperlipidemia, unspecified: Secondary | ICD-10-CM | POA: Diagnosis not present

## 2022-11-17 DIAGNOSIS — D539 Nutritional anemia, unspecified: Secondary | ICD-10-CM | POA: Diagnosis not present

## 2022-11-18 ENCOUNTER — Telehealth: Payer: Self-pay

## 2022-11-18 NOTE — Patient Outreach (Signed)
  Care Coordination   11/18/2022 Name: [name] MRN: [id number] DOB: [birth year]   Care Coordination Outreach Attempts:  A second unsuccessful outreach was attempted today to offer the patient with information about available care coordination services as a benefit of their health plan.     Follow Up Plan:  Additional outreach attempts will be made to offer the patient care coordination information and services.   Encounter Outcome:  No Answer   Care Coordination Interventions:  No, not indicated   Tomasa Rand, RN, BSN, [facility] [employer] [phone]

## 2022-11-24 ENCOUNTER — Telehealth: Payer: Self-pay

## 2022-11-24 DIAGNOSIS — E039 Hypothyroidism, unspecified: Secondary | ICD-10-CM | POA: Diagnosis not present

## 2022-11-24 DIAGNOSIS — D539 Nutritional anemia, unspecified: Secondary | ICD-10-CM | POA: Diagnosis not present

## 2022-11-24 DIAGNOSIS — E785 Hyperlipidemia, unspecified: Secondary | ICD-10-CM | POA: Diagnosis not present

## 2022-11-24 DIAGNOSIS — L89154 Pressure ulcer of sacral region, stage 4: Secondary | ICD-10-CM | POA: Diagnosis not present

## 2022-11-24 NOTE — Patient Outreach (Signed)
  Care Coordination   11/24/2022 Name: ZAKIAH BECKERMAN MRN: [id number] DOB: 01-May-1930   Care Coordination Outreach Attempts:  A third unsuccessful outreach was attempted today to offer the patient with information about available care coordination services as a benefit of their health plan.   Follow Up Plan:  No further outreach attempts will be made at this time. We have been unable to contact the patient to offer or enroll patient in care coordination services  Encounter Outcome:  No Answer   Care Coordination Interventions:  No, not indicated    Tomasa Rand, RN, BSN, [facility] [place] Coordinator [phone]

## 2022-11-29 DIAGNOSIS — B372 Candidiasis of skin and nail: Secondary | ICD-10-CM | POA: Diagnosis not present

## 2022-12-01 DIAGNOSIS — E039 Hypothyroidism, unspecified: Secondary | ICD-10-CM | POA: Diagnosis not present

## 2022-12-01 DIAGNOSIS — L89154 Pressure ulcer of sacral region, stage 4: Secondary | ICD-10-CM | POA: Diagnosis not present

## 2022-12-01 DIAGNOSIS — E785 Hyperlipidemia, unspecified: Secondary | ICD-10-CM | POA: Diagnosis not present

## 2022-12-01 DIAGNOSIS — D539 Nutritional anemia, unspecified: Secondary | ICD-10-CM | POA: Diagnosis not present

## 2022-12-08 DIAGNOSIS — L89154 Pressure ulcer of sacral region, stage 4: Secondary | ICD-10-CM | POA: Diagnosis not present

## 2022-12-08 DIAGNOSIS — E039 Hypothyroidism, unspecified: Secondary | ICD-10-CM | POA: Diagnosis not present

## 2022-12-08 DIAGNOSIS — D539 Nutritional anemia, unspecified: Secondary | ICD-10-CM | POA: Diagnosis not present

## 2022-12-08 DIAGNOSIS — E785 Hyperlipidemia, unspecified: Secondary | ICD-10-CM | POA: Diagnosis not present

## 2022-12-17 DIAGNOSIS — E785 Hyperlipidemia, unspecified: Secondary | ICD-10-CM | POA: Diagnosis not present

## 2022-12-17 DIAGNOSIS — E039 Hypothyroidism, unspecified: Secondary | ICD-10-CM | POA: Diagnosis not present

## 2022-12-17 DIAGNOSIS — L89154 Pressure ulcer of sacral region, stage 4: Secondary | ICD-10-CM | POA: Diagnosis not present

## 2022-12-17 DIAGNOSIS — D539 Nutritional anemia, unspecified: Secondary | ICD-10-CM | POA: Diagnosis not present

## 2022-12-24 DIAGNOSIS — D539 Nutritional anemia, unspecified: Secondary | ICD-10-CM | POA: Diagnosis not present

## 2022-12-24 DIAGNOSIS — L89154 Pressure ulcer of sacral region, stage 4: Secondary | ICD-10-CM | POA: Diagnosis not present

## 2022-12-24 DIAGNOSIS — E039 Hypothyroidism, unspecified: Secondary | ICD-10-CM | POA: Diagnosis not present

## 2022-12-24 DIAGNOSIS — E785 Hyperlipidemia, unspecified: Secondary | ICD-10-CM | POA: Diagnosis not present

## 2022-12-29 DIAGNOSIS — E039 Hypothyroidism, unspecified: Secondary | ICD-10-CM | POA: Diagnosis not present

## 2022-12-29 DIAGNOSIS — E785 Hyperlipidemia, unspecified: Secondary | ICD-10-CM | POA: Diagnosis not present

## 2022-12-29 DIAGNOSIS — D539 Nutritional anemia, unspecified: Secondary | ICD-10-CM | POA: Diagnosis not present

## 2022-12-29 DIAGNOSIS — L89154 Pressure ulcer of sacral region, stage 4: Secondary | ICD-10-CM | POA: Diagnosis not present

## 2023-01-04 DIAGNOSIS — L89154 Pressure ulcer of sacral region, stage 4: Secondary | ICD-10-CM | POA: Diagnosis not present

## 2023-01-04 DIAGNOSIS — E039 Hypothyroidism, unspecified: Secondary | ICD-10-CM | POA: Diagnosis not present

## 2023-01-04 DIAGNOSIS — E785 Hyperlipidemia, unspecified: Secondary | ICD-10-CM | POA: Diagnosis not present

## 2023-01-04 DIAGNOSIS — D539 Nutritional anemia, unspecified: Secondary | ICD-10-CM | POA: Diagnosis not present

## 2023-01-07 DIAGNOSIS — E785 Hyperlipidemia, unspecified: Secondary | ICD-10-CM | POA: Diagnosis not present

## 2023-01-07 DIAGNOSIS — E039 Hypothyroidism, unspecified: Secondary | ICD-10-CM | POA: Diagnosis not present

## 2023-01-07 DIAGNOSIS — L89154 Pressure ulcer of sacral region, stage 4: Secondary | ICD-10-CM | POA: Diagnosis not present

## 2023-01-07 DIAGNOSIS — I4891 Unspecified atrial fibrillation: Secondary | ICD-10-CM | POA: Diagnosis not present

## 2023-01-07 DIAGNOSIS — R52 Pain, unspecified: Secondary | ICD-10-CM | POA: Diagnosis not present

## 2023-01-07 DIAGNOSIS — Z681 Body mass index (BMI) 19 or less, adult: Secondary | ICD-10-CM | POA: Diagnosis not present

## 2023-01-07 DIAGNOSIS — K900 Celiac disease: Secondary | ICD-10-CM | POA: Diagnosis not present

## 2023-01-07 DIAGNOSIS — K5900 Constipation, unspecified: Secondary | ICD-10-CM | POA: Diagnosis not present

## 2023-01-07 DIAGNOSIS — E559 Vitamin D deficiency, unspecified: Secondary | ICD-10-CM | POA: Diagnosis not present

## 2023-01-10 DIAGNOSIS — I10 Essential (primary) hypertension: Secondary | ICD-10-CM | POA: Diagnosis not present

## 2023-01-12 DIAGNOSIS — E785 Hyperlipidemia, unspecified: Secondary | ICD-10-CM | POA: Diagnosis not present

## 2023-01-12 DIAGNOSIS — L89154 Pressure ulcer of sacral region, stage 4: Secondary | ICD-10-CM | POA: Diagnosis not present

## 2023-01-12 DIAGNOSIS — D539 Nutritional anemia, unspecified: Secondary | ICD-10-CM | POA: Diagnosis not present

## 2023-01-12 DIAGNOSIS — E039 Hypothyroidism, unspecified: Secondary | ICD-10-CM | POA: Diagnosis not present

## 2023-01-13 DIAGNOSIS — R1310 Dysphagia, unspecified: Secondary | ICD-10-CM | POA: Diagnosis not present

## 2023-01-13 DIAGNOSIS — N182 Chronic kidney disease, stage 2 (mild): Secondary | ICD-10-CM | POA: Diagnosis not present

## 2023-01-13 DIAGNOSIS — D649 Anemia, unspecified: Secondary | ICD-10-CM | POA: Diagnosis not present

## 2023-01-13 DIAGNOSIS — Z7689 Persons encountering health services in other specified circumstances: Secondary | ICD-10-CM | POA: Diagnosis not present

## 2023-01-13 DIAGNOSIS — M6282 Rhabdomyolysis: Secondary | ICD-10-CM | POA: Diagnosis not present

## 2023-01-13 DIAGNOSIS — L89154 Pressure ulcer of sacral region, stage 4: Secondary | ICD-10-CM | POA: Diagnosis not present

## 2023-01-14 DIAGNOSIS — L89154 Pressure ulcer of sacral region, stage 4: Secondary | ICD-10-CM | POA: Diagnosis not present

## 2023-01-14 DIAGNOSIS — M6282 Rhabdomyolysis: Secondary | ICD-10-CM | POA: Diagnosis not present

## 2023-01-14 DIAGNOSIS — R1310 Dysphagia, unspecified: Secondary | ICD-10-CM | POA: Diagnosis not present

## 2023-01-18 DIAGNOSIS — E039 Hypothyroidism, unspecified: Secondary | ICD-10-CM | POA: Diagnosis not present

## 2023-01-18 DIAGNOSIS — E785 Hyperlipidemia, unspecified: Secondary | ICD-10-CM | POA: Diagnosis not present

## 2023-01-18 DIAGNOSIS — R1310 Dysphagia, unspecified: Secondary | ICD-10-CM | POA: Diagnosis not present

## 2023-01-18 DIAGNOSIS — M6282 Rhabdomyolysis: Secondary | ICD-10-CM | POA: Diagnosis not present

## 2023-01-18 DIAGNOSIS — D539 Nutritional anemia, unspecified: Secondary | ICD-10-CM | POA: Diagnosis not present

## 2023-01-18 DIAGNOSIS — L89154 Pressure ulcer of sacral region, stage 4: Secondary | ICD-10-CM | POA: Diagnosis not present

## 2023-01-19 DIAGNOSIS — R1310 Dysphagia, unspecified: Secondary | ICD-10-CM | POA: Diagnosis not present

## 2023-01-19 DIAGNOSIS — M6282 Rhabdomyolysis: Secondary | ICD-10-CM | POA: Diagnosis not present

## 2023-01-19 DIAGNOSIS — L89154 Pressure ulcer of sacral region, stage 4: Secondary | ICD-10-CM | POA: Diagnosis not present

## 2023-01-20 DIAGNOSIS — M6282 Rhabdomyolysis: Secondary | ICD-10-CM | POA: Diagnosis not present

## 2023-01-20 DIAGNOSIS — R1310 Dysphagia, unspecified: Secondary | ICD-10-CM | POA: Diagnosis not present

## 2023-01-20 DIAGNOSIS — L89154 Pressure ulcer of sacral region, stage 4: Secondary | ICD-10-CM | POA: Diagnosis not present

## 2023-01-21 DIAGNOSIS — L89154 Pressure ulcer of sacral region, stage 4: Secondary | ICD-10-CM | POA: Diagnosis not present

## 2023-01-21 DIAGNOSIS — R1310 Dysphagia, unspecified: Secondary | ICD-10-CM | POA: Diagnosis not present

## 2023-01-21 DIAGNOSIS — M6282 Rhabdomyolysis: Secondary | ICD-10-CM | POA: Diagnosis not present

## 2023-01-26 DIAGNOSIS — L89154 Pressure ulcer of sacral region, stage 4: Secondary | ICD-10-CM | POA: Diagnosis not present

## 2023-01-26 DIAGNOSIS — D539 Nutritional anemia, unspecified: Secondary | ICD-10-CM | POA: Diagnosis not present

## 2023-01-26 DIAGNOSIS — E039 Hypothyroidism, unspecified: Secondary | ICD-10-CM | POA: Diagnosis not present

## 2023-01-26 DIAGNOSIS — E785 Hyperlipidemia, unspecified: Secondary | ICD-10-CM | POA: Diagnosis not present

## 2023-02-02 DIAGNOSIS — L89154 Pressure ulcer of sacral region, stage 4: Secondary | ICD-10-CM | POA: Diagnosis not present

## 2023-02-02 DIAGNOSIS — E039 Hypothyroidism, unspecified: Secondary | ICD-10-CM | POA: Diagnosis not present

## 2023-02-02 DIAGNOSIS — D539 Nutritional anemia, unspecified: Secondary | ICD-10-CM | POA: Diagnosis not present

## 2023-02-02 DIAGNOSIS — E785 Hyperlipidemia, unspecified: Secondary | ICD-10-CM | POA: Diagnosis not present

## 2023-02-09 DIAGNOSIS — L89154 Pressure ulcer of sacral region, stage 4: Secondary | ICD-10-CM | POA: Diagnosis not present

## 2023-02-09 DIAGNOSIS — E039 Hypothyroidism, unspecified: Secondary | ICD-10-CM | POA: Diagnosis not present

## 2023-02-09 DIAGNOSIS — E785 Hyperlipidemia, unspecified: Secondary | ICD-10-CM | POA: Diagnosis not present

## 2023-02-09 DIAGNOSIS — D539 Nutritional anemia, unspecified: Secondary | ICD-10-CM | POA: Diagnosis not present

## 2023-02-15 DIAGNOSIS — E785 Hyperlipidemia, unspecified: Secondary | ICD-10-CM | POA: Diagnosis not present

## 2023-02-15 DIAGNOSIS — D539 Nutritional anemia, unspecified: Secondary | ICD-10-CM | POA: Diagnosis not present

## 2023-02-15 DIAGNOSIS — L89154 Pressure ulcer of sacral region, stage 4: Secondary | ICD-10-CM | POA: Diagnosis not present

## 2023-02-15 DIAGNOSIS — E039 Hypothyroidism, unspecified: Secondary | ICD-10-CM | POA: Diagnosis not present

## 2023-02-18 DIAGNOSIS — E785 Hyperlipidemia, unspecified: Secondary | ICD-10-CM | POA: Diagnosis not present

## 2023-02-18 DIAGNOSIS — K900 Celiac disease: Secondary | ICD-10-CM

## 2023-02-18 DIAGNOSIS — I4891 Unspecified atrial fibrillation: Secondary | ICD-10-CM | POA: Diagnosis not present

## 2023-02-18 DIAGNOSIS — K5900 Constipation, unspecified: Secondary | ICD-10-CM | POA: Diagnosis not present

## 2023-02-18 DIAGNOSIS — E559 Vitamin D deficiency, unspecified: Secondary | ICD-10-CM

## 2023-02-18 DIAGNOSIS — E039 Hypothyroidism, unspecified: Secondary | ICD-10-CM | POA: Diagnosis not present

## 2023-02-18 DIAGNOSIS — Z681 Body mass index (BMI) 19 or less, adult: Secondary | ICD-10-CM

## 2023-02-18 DIAGNOSIS — R52 Pain, unspecified: Secondary | ICD-10-CM

## 2023-02-18 DIAGNOSIS — L89154 Pressure ulcer of sacral region, stage 4: Secondary | ICD-10-CM | POA: Diagnosis not present

## 2023-02-22 DIAGNOSIS — L89154 Pressure ulcer of sacral region, stage 4: Secondary | ICD-10-CM | POA: Diagnosis not present

## 2023-02-22 DIAGNOSIS — E785 Hyperlipidemia, unspecified: Secondary | ICD-10-CM | POA: Diagnosis not present

## 2023-02-22 DIAGNOSIS — E039 Hypothyroidism, unspecified: Secondary | ICD-10-CM | POA: Diagnosis not present

## 2023-02-22 DIAGNOSIS — D539 Nutritional anemia, unspecified: Secondary | ICD-10-CM | POA: Diagnosis not present

## 2023-03-03 DIAGNOSIS — E785 Hyperlipidemia, unspecified: Secondary | ICD-10-CM | POA: Diagnosis not present

## 2023-03-03 DIAGNOSIS — D539 Nutritional anemia, unspecified: Secondary | ICD-10-CM | POA: Diagnosis not present

## 2023-03-03 DIAGNOSIS — E039 Hypothyroidism, unspecified: Secondary | ICD-10-CM | POA: Diagnosis not present

## 2023-03-03 DIAGNOSIS — L89154 Pressure ulcer of sacral region, stage 4: Secondary | ICD-10-CM | POA: Diagnosis not present

## 2023-03-07 DIAGNOSIS — L89154 Pressure ulcer of sacral region, stage 4: Secondary | ICD-10-CM | POA: Diagnosis not present

## 2023-03-17 DIAGNOSIS — E039 Hypothyroidism, unspecified: Secondary | ICD-10-CM | POA: Diagnosis not present

## 2023-03-17 DIAGNOSIS — D539 Nutritional anemia, unspecified: Secondary | ICD-10-CM | POA: Diagnosis not present

## 2023-03-17 DIAGNOSIS — E785 Hyperlipidemia, unspecified: Secondary | ICD-10-CM | POA: Diagnosis not present

## 2023-03-17 DIAGNOSIS — L89154 Pressure ulcer of sacral region, stage 4: Secondary | ICD-10-CM | POA: Diagnosis not present

## 2023-03-21 DIAGNOSIS — L89154 Pressure ulcer of sacral region, stage 4: Secondary | ICD-10-CM | POA: Diagnosis not present

## 2023-03-21 DIAGNOSIS — E039 Hypothyroidism, unspecified: Secondary | ICD-10-CM | POA: Diagnosis not present

## 2023-03-21 DIAGNOSIS — D539 Nutritional anemia, unspecified: Secondary | ICD-10-CM | POA: Diagnosis not present

## 2023-03-21 DIAGNOSIS — E785 Hyperlipidemia, unspecified: Secondary | ICD-10-CM | POA: Diagnosis not present

## 2023-03-24 DIAGNOSIS — E785 Hyperlipidemia, unspecified: Secondary | ICD-10-CM | POA: Diagnosis not present

## 2023-03-24 DIAGNOSIS — D539 Nutritional anemia, unspecified: Secondary | ICD-10-CM | POA: Diagnosis not present

## 2023-03-24 DIAGNOSIS — L89154 Pressure ulcer of sacral region, stage 4: Secondary | ICD-10-CM | POA: Diagnosis not present

## 2023-03-24 DIAGNOSIS — E039 Hypothyroidism, unspecified: Secondary | ICD-10-CM | POA: Diagnosis not present

## 2023-03-31 DIAGNOSIS — L89154 Pressure ulcer of sacral region, stage 4: Secondary | ICD-10-CM | POA: Diagnosis not present

## 2023-03-31 DIAGNOSIS — D539 Nutritional anemia, unspecified: Secondary | ICD-10-CM | POA: Diagnosis not present

## 2023-03-31 DIAGNOSIS — E039 Hypothyroidism, unspecified: Secondary | ICD-10-CM | POA: Diagnosis not present

## 2023-03-31 DIAGNOSIS — E785 Hyperlipidemia, unspecified: Secondary | ICD-10-CM | POA: Diagnosis not present

## 2023-05-18 DIAGNOSIS — E785 Hyperlipidemia, unspecified: Secondary | ICD-10-CM | POA: Diagnosis not present

## 2023-05-18 DIAGNOSIS — D539 Nutritional anemia, unspecified: Secondary | ICD-10-CM | POA: Diagnosis not present

## 2023-05-18 DIAGNOSIS — L89154 Pressure ulcer of sacral region, stage 4: Secondary | ICD-10-CM | POA: Diagnosis not present

## 2023-05-18 DIAGNOSIS — E039 Hypothyroidism, unspecified: Secondary | ICD-10-CM | POA: Diagnosis not present

## 2023-05-25 DIAGNOSIS — L89154 Pressure ulcer of sacral region, stage 4: Secondary | ICD-10-CM | POA: Diagnosis not present

## 2023-05-25 DIAGNOSIS — D539 Nutritional anemia, unspecified: Secondary | ICD-10-CM | POA: Diagnosis not present

## 2023-05-25 DIAGNOSIS — E039 Hypothyroidism, unspecified: Secondary | ICD-10-CM | POA: Diagnosis not present

## 2023-05-25 DIAGNOSIS — E785 Hyperlipidemia, unspecified: Secondary | ICD-10-CM | POA: Diagnosis not present

## 2023-06-01 DIAGNOSIS — E785 Hyperlipidemia, unspecified: Secondary | ICD-10-CM | POA: Diagnosis not present

## 2023-06-01 DIAGNOSIS — E039 Hypothyroidism, unspecified: Secondary | ICD-10-CM | POA: Diagnosis not present

## 2023-06-01 DIAGNOSIS — L89154 Pressure ulcer of sacral region, stage 4: Secondary | ICD-10-CM | POA: Diagnosis not present

## 2023-06-01 DIAGNOSIS — D539 Nutritional anemia, unspecified: Secondary | ICD-10-CM | POA: Diagnosis not present

## 2023-06-07 DIAGNOSIS — C131 Malignant neoplasm of aryepiglottic fold, hypopharyngeal aspect: Secondary | ICD-10-CM | POA: Diagnosis not present

## 2023-06-07 DIAGNOSIS — E119 Type 2 diabetes mellitus without complications: Secondary | ICD-10-CM | POA: Diagnosis not present

## 2023-06-07 DIAGNOSIS — D511 Vitamin B12 deficiency anemia due to selective vitamin B12 malabsorption with proteinuria: Secondary | ICD-10-CM | POA: Diagnosis not present

## 2023-06-07 DIAGNOSIS — D649 Anemia, unspecified: Secondary | ICD-10-CM | POA: Diagnosis not present

## 2023-06-07 DIAGNOSIS — D510 Vitamin B12 deficiency anemia due to intrinsic factor deficiency: Secondary | ICD-10-CM | POA: Diagnosis not present

## 2023-06-07 DIAGNOSIS — I4891 Unspecified atrial fibrillation: Secondary | ICD-10-CM | POA: Diagnosis not present

## 2023-06-07 DIAGNOSIS — I10 Essential (primary) hypertension: Secondary | ICD-10-CM | POA: Diagnosis not present

## 2023-06-07 DIAGNOSIS — D520 Dietary folate deficiency anemia: Secondary | ICD-10-CM | POA: Diagnosis not present

## 2023-06-08 DIAGNOSIS — L89154 Pressure ulcer of sacral region, stage 4: Secondary | ICD-10-CM | POA: Diagnosis not present

## 2023-06-08 DIAGNOSIS — E039 Hypothyroidism, unspecified: Secondary | ICD-10-CM | POA: Diagnosis not present

## 2023-06-08 DIAGNOSIS — D539 Nutritional anemia, unspecified: Secondary | ICD-10-CM | POA: Diagnosis not present

## 2023-06-08 DIAGNOSIS — E785 Hyperlipidemia, unspecified: Secondary | ICD-10-CM | POA: Diagnosis not present

## 2023-06-15 DIAGNOSIS — D539 Nutritional anemia, unspecified: Secondary | ICD-10-CM | POA: Diagnosis not present

## 2023-06-15 DIAGNOSIS — L89154 Pressure ulcer of sacral region, stage 4: Secondary | ICD-10-CM | POA: Diagnosis not present

## 2023-06-15 DIAGNOSIS — E785 Hyperlipidemia, unspecified: Secondary | ICD-10-CM | POA: Diagnosis not present

## 2023-06-15 DIAGNOSIS — E039 Hypothyroidism, unspecified: Secondary | ICD-10-CM | POA: Diagnosis not present

## 2023-06-22 DIAGNOSIS — L89154 Pressure ulcer of sacral region, stage 4: Secondary | ICD-10-CM | POA: Diagnosis not present

## 2023-06-22 DIAGNOSIS — D539 Nutritional anemia, unspecified: Secondary | ICD-10-CM | POA: Diagnosis not present

## 2023-06-22 DIAGNOSIS — E785 Hyperlipidemia, unspecified: Secondary | ICD-10-CM | POA: Diagnosis not present

## 2023-06-22 DIAGNOSIS — E039 Hypothyroidism, unspecified: Secondary | ICD-10-CM | POA: Diagnosis not present

## 2023-06-29 DIAGNOSIS — L89154 Pressure ulcer of sacral region, stage 4: Secondary | ICD-10-CM | POA: Diagnosis not present

## 2023-06-29 DIAGNOSIS — D539 Nutritional anemia, unspecified: Secondary | ICD-10-CM | POA: Diagnosis not present

## 2023-06-29 DIAGNOSIS — E785 Hyperlipidemia, unspecified: Secondary | ICD-10-CM | POA: Diagnosis not present

## 2023-06-29 DIAGNOSIS — E039 Hypothyroidism, unspecified: Secondary | ICD-10-CM | POA: Diagnosis not present

## 2023-07-06 DIAGNOSIS — D539 Nutritional anemia, unspecified: Secondary | ICD-10-CM | POA: Diagnosis not present

## 2023-07-06 DIAGNOSIS — E785 Hyperlipidemia, unspecified: Secondary | ICD-10-CM | POA: Diagnosis not present

## 2023-07-06 DIAGNOSIS — L89154 Pressure ulcer of sacral region, stage 4: Secondary | ICD-10-CM | POA: Diagnosis not present

## 2023-07-06 DIAGNOSIS — E039 Hypothyroidism, unspecified: Secondary | ICD-10-CM | POA: Diagnosis not present

## 2023-07-13 DIAGNOSIS — E785 Hyperlipidemia, unspecified: Secondary | ICD-10-CM | POA: Diagnosis not present

## 2023-07-13 DIAGNOSIS — L89154 Pressure ulcer of sacral region, stage 4: Secondary | ICD-10-CM | POA: Diagnosis not present

## 2023-07-13 DIAGNOSIS — D539 Nutritional anemia, unspecified: Secondary | ICD-10-CM | POA: Diagnosis not present

## 2023-07-13 DIAGNOSIS — E039 Hypothyroidism, unspecified: Secondary | ICD-10-CM | POA: Diagnosis not present

## 2023-07-20 DIAGNOSIS — D539 Nutritional anemia, unspecified: Secondary | ICD-10-CM | POA: Diagnosis not present

## 2023-07-20 DIAGNOSIS — L89154 Pressure ulcer of sacral region, stage 4: Secondary | ICD-10-CM | POA: Diagnosis not present

## 2023-07-20 DIAGNOSIS — S80811A Abrasion, right lower leg, initial encounter: Secondary | ICD-10-CM | POA: Diagnosis not present

## 2023-07-20 DIAGNOSIS — E039 Hypothyroidism, unspecified: Secondary | ICD-10-CM | POA: Diagnosis not present

## 2023-07-27 DIAGNOSIS — D539 Nutritional anemia, unspecified: Secondary | ICD-10-CM | POA: Diagnosis not present

## 2023-07-27 DIAGNOSIS — E039 Hypothyroidism, unspecified: Secondary | ICD-10-CM | POA: Diagnosis not present

## 2023-07-27 DIAGNOSIS — E785 Hyperlipidemia, unspecified: Secondary | ICD-10-CM | POA: Diagnosis not present

## 2023-07-27 DIAGNOSIS — L89154 Pressure ulcer of sacral region, stage 4: Secondary | ICD-10-CM | POA: Diagnosis not present

## 2023-08-04 DIAGNOSIS — L89154 Pressure ulcer of sacral region, stage 4: Secondary | ICD-10-CM | POA: Diagnosis not present

## 2023-08-04 DIAGNOSIS — E785 Hyperlipidemia, unspecified: Secondary | ICD-10-CM | POA: Diagnosis not present

## 2023-08-04 DIAGNOSIS — E039 Hypothyroidism, unspecified: Secondary | ICD-10-CM | POA: Diagnosis not present

## 2023-08-04 DIAGNOSIS — D539 Nutritional anemia, unspecified: Secondary | ICD-10-CM | POA: Diagnosis not present

## 2023-08-10 DIAGNOSIS — L89154 Pressure ulcer of sacral region, stage 4: Secondary | ICD-10-CM | POA: Diagnosis not present

## 2023-08-10 DIAGNOSIS — E039 Hypothyroidism, unspecified: Secondary | ICD-10-CM | POA: Diagnosis not present

## 2023-08-10 DIAGNOSIS — E785 Hyperlipidemia, unspecified: Secondary | ICD-10-CM | POA: Diagnosis not present

## 2023-08-10 DIAGNOSIS — D539 Nutritional anemia, unspecified: Secondary | ICD-10-CM | POA: Diagnosis not present

## 2023-08-17 DIAGNOSIS — D539 Nutritional anemia, unspecified: Secondary | ICD-10-CM | POA: Diagnosis not present

## 2023-08-17 DIAGNOSIS — L89154 Pressure ulcer of sacral region, stage 4: Secondary | ICD-10-CM | POA: Diagnosis not present

## 2023-08-17 DIAGNOSIS — E785 Hyperlipidemia, unspecified: Secondary | ICD-10-CM | POA: Diagnosis not present

## 2023-08-17 DIAGNOSIS — E039 Hypothyroidism, unspecified: Secondary | ICD-10-CM | POA: Diagnosis not present

## 2023-08-24 DIAGNOSIS — L89154 Pressure ulcer of sacral region, stage 4: Secondary | ICD-10-CM | POA: Diagnosis not present

## 2023-08-24 DIAGNOSIS — D539 Nutritional anemia, unspecified: Secondary | ICD-10-CM | POA: Diagnosis not present

## 2023-08-24 DIAGNOSIS — E785 Hyperlipidemia, unspecified: Secondary | ICD-10-CM | POA: Diagnosis not present

## 2023-08-24 DIAGNOSIS — E039 Hypothyroidism, unspecified: Secondary | ICD-10-CM | POA: Diagnosis not present

## 2023-08-31 DIAGNOSIS — D539 Nutritional anemia, unspecified: Secondary | ICD-10-CM | POA: Diagnosis not present

## 2023-08-31 DIAGNOSIS — K900 Celiac disease: Secondary | ICD-10-CM | POA: Diagnosis not present

## 2023-08-31 DIAGNOSIS — E039 Hypothyroidism, unspecified: Secondary | ICD-10-CM | POA: Diagnosis not present

## 2023-08-31 DIAGNOSIS — I4891 Unspecified atrial fibrillation: Secondary | ICD-10-CM | POA: Diagnosis not present

## 2023-08-31 DIAGNOSIS — R52 Pain, unspecified: Secondary | ICD-10-CM | POA: Diagnosis not present

## 2023-08-31 DIAGNOSIS — R001 Bradycardia, unspecified: Secondary | ICD-10-CM | POA: Diagnosis not present

## 2023-08-31 DIAGNOSIS — L89154 Pressure ulcer of sacral region, stage 4: Secondary | ICD-10-CM | POA: Diagnosis not present

## 2023-08-31 DIAGNOSIS — E785 Hyperlipidemia, unspecified: Secondary | ICD-10-CM | POA: Diagnosis not present

## 2023-09-01 DIAGNOSIS — M6281 Muscle weakness (generalized): Secondary | ICD-10-CM | POA: Diagnosis not present

## 2023-09-01 DIAGNOSIS — L89154 Pressure ulcer of sacral region, stage 4: Secondary | ICD-10-CM | POA: Diagnosis not present

## 2023-09-02 DIAGNOSIS — D539 Nutritional anemia, unspecified: Secondary | ICD-10-CM | POA: Diagnosis not present

## 2023-09-02 DIAGNOSIS — N179 Acute kidney failure, unspecified: Secondary | ICD-10-CM | POA: Diagnosis not present

## 2023-09-02 DIAGNOSIS — K5900 Constipation, unspecified: Secondary | ICD-10-CM | POA: Diagnosis not present

## 2023-09-02 DIAGNOSIS — E872 Acidosis, unspecified: Secondary | ICD-10-CM | POA: Diagnosis not present

## 2023-09-02 DIAGNOSIS — E039 Hypothyroidism, unspecified: Secondary | ICD-10-CM | POA: Diagnosis not present

## 2023-09-02 DIAGNOSIS — Z7901 Long term (current) use of anticoagulants: Secondary | ICD-10-CM | POA: Diagnosis not present

## 2023-09-02 DIAGNOSIS — I4891 Unspecified atrial fibrillation: Secondary | ICD-10-CM | POA: Diagnosis not present

## 2023-09-02 DIAGNOSIS — Z8546 Personal history of malignant neoplasm of prostate: Secondary | ICD-10-CM | POA: Diagnosis not present

## 2023-09-02 DIAGNOSIS — E785 Hyperlipidemia, unspecified: Secondary | ICD-10-CM | POA: Diagnosis not present

## 2023-09-02 DIAGNOSIS — R1310 Dysphagia, unspecified: Secondary | ICD-10-CM | POA: Diagnosis not present

## 2023-09-07 DIAGNOSIS — D539 Nutritional anemia, unspecified: Secondary | ICD-10-CM | POA: Diagnosis not present

## 2023-09-07 DIAGNOSIS — E039 Hypothyroidism, unspecified: Secondary | ICD-10-CM | POA: Diagnosis not present

## 2023-09-07 DIAGNOSIS — L89154 Pressure ulcer of sacral region, stage 4: Secondary | ICD-10-CM | POA: Diagnosis not present

## 2023-09-07 DIAGNOSIS — E785 Hyperlipidemia, unspecified: Secondary | ICD-10-CM | POA: Diagnosis not present

## 2023-09-13 DIAGNOSIS — E611 Iron deficiency: Secondary | ICD-10-CM | POA: Diagnosis not present

## 2023-09-13 DIAGNOSIS — I10 Essential (primary) hypertension: Secondary | ICD-10-CM | POA: Diagnosis not present

## 2023-09-14 DIAGNOSIS — E039 Hypothyroidism, unspecified: Secondary | ICD-10-CM | POA: Diagnosis not present

## 2023-09-14 DIAGNOSIS — L89154 Pressure ulcer of sacral region, stage 4: Secondary | ICD-10-CM | POA: Diagnosis not present

## 2023-09-14 DIAGNOSIS — E785 Hyperlipidemia, unspecified: Secondary | ICD-10-CM | POA: Diagnosis not present

## 2023-09-14 DIAGNOSIS — D539 Nutritional anemia, unspecified: Secondary | ICD-10-CM | POA: Diagnosis not present

## 2023-09-21 DIAGNOSIS — D539 Nutritional anemia, unspecified: Secondary | ICD-10-CM | POA: Diagnosis not present

## 2023-09-21 DIAGNOSIS — B354 Tinea corporis: Secondary | ICD-10-CM | POA: Diagnosis not present

## 2023-09-21 DIAGNOSIS — L89154 Pressure ulcer of sacral region, stage 4: Secondary | ICD-10-CM | POA: Diagnosis not present

## 2023-09-21 DIAGNOSIS — E785 Hyperlipidemia, unspecified: Secondary | ICD-10-CM | POA: Diagnosis not present

## 2023-09-21 DIAGNOSIS — E039 Hypothyroidism, unspecified: Secondary | ICD-10-CM | POA: Diagnosis not present

## 2023-09-28 DIAGNOSIS — D539 Nutritional anemia, unspecified: Secondary | ICD-10-CM | POA: Diagnosis not present

## 2023-09-28 DIAGNOSIS — L89154 Pressure ulcer of sacral region, stage 4: Secondary | ICD-10-CM | POA: Diagnosis not present

## 2023-09-28 DIAGNOSIS — E785 Hyperlipidemia, unspecified: Secondary | ICD-10-CM | POA: Diagnosis not present

## 2023-09-28 DIAGNOSIS — E039 Hypothyroidism, unspecified: Secondary | ICD-10-CM | POA: Diagnosis not present

## 2023-10-05 DIAGNOSIS — D539 Nutritional anemia, unspecified: Secondary | ICD-10-CM | POA: Diagnosis not present

## 2023-10-05 DIAGNOSIS — E039 Hypothyroidism, unspecified: Secondary | ICD-10-CM | POA: Diagnosis not present

## 2023-10-05 DIAGNOSIS — E785 Hyperlipidemia, unspecified: Secondary | ICD-10-CM | POA: Diagnosis not present

## 2023-10-05 DIAGNOSIS — L89153 Pressure ulcer of sacral region, stage 3: Secondary | ICD-10-CM | POA: Diagnosis not present

## 2023-10-12 DIAGNOSIS — E039 Hypothyroidism, unspecified: Secondary | ICD-10-CM | POA: Diagnosis not present

## 2023-10-12 DIAGNOSIS — D539 Nutritional anemia, unspecified: Secondary | ICD-10-CM | POA: Diagnosis not present

## 2023-10-12 DIAGNOSIS — L89153 Pressure ulcer of sacral region, stage 3: Secondary | ICD-10-CM | POA: Diagnosis not present

## 2023-10-12 DIAGNOSIS — E785 Hyperlipidemia, unspecified: Secondary | ICD-10-CM | POA: Diagnosis not present

## 2023-10-20 DIAGNOSIS — D539 Nutritional anemia, unspecified: Secondary | ICD-10-CM | POA: Diagnosis not present

## 2023-10-20 DIAGNOSIS — L89153 Pressure ulcer of sacral region, stage 3: Secondary | ICD-10-CM | POA: Diagnosis not present

## 2023-10-20 DIAGNOSIS — E785 Hyperlipidemia, unspecified: Secondary | ICD-10-CM | POA: Diagnosis not present

## 2023-10-20 DIAGNOSIS — E039 Hypothyroidism, unspecified: Secondary | ICD-10-CM | POA: Diagnosis not present

## 2023-10-27 DIAGNOSIS — D539 Nutritional anemia, unspecified: Secondary | ICD-10-CM | POA: Diagnosis not present

## 2023-10-27 DIAGNOSIS — L89153 Pressure ulcer of sacral region, stage 3: Secondary | ICD-10-CM | POA: Diagnosis not present

## 2023-10-27 DIAGNOSIS — E039 Hypothyroidism, unspecified: Secondary | ICD-10-CM | POA: Diagnosis not present

## 2023-10-27 DIAGNOSIS — E785 Hyperlipidemia, unspecified: Secondary | ICD-10-CM | POA: Diagnosis not present

## 2023-10-30 IMAGING — CR DG ELBOW COMPLETE 3+V*L*
4 series · 4 of 4 positions shown · non-contrast
Comparison: None.

CLINICAL DATA: Fall, left elbow pain

EXAM:
LEFT ELBOW - COMPLETE 3+ VIEW

[elbow ap]
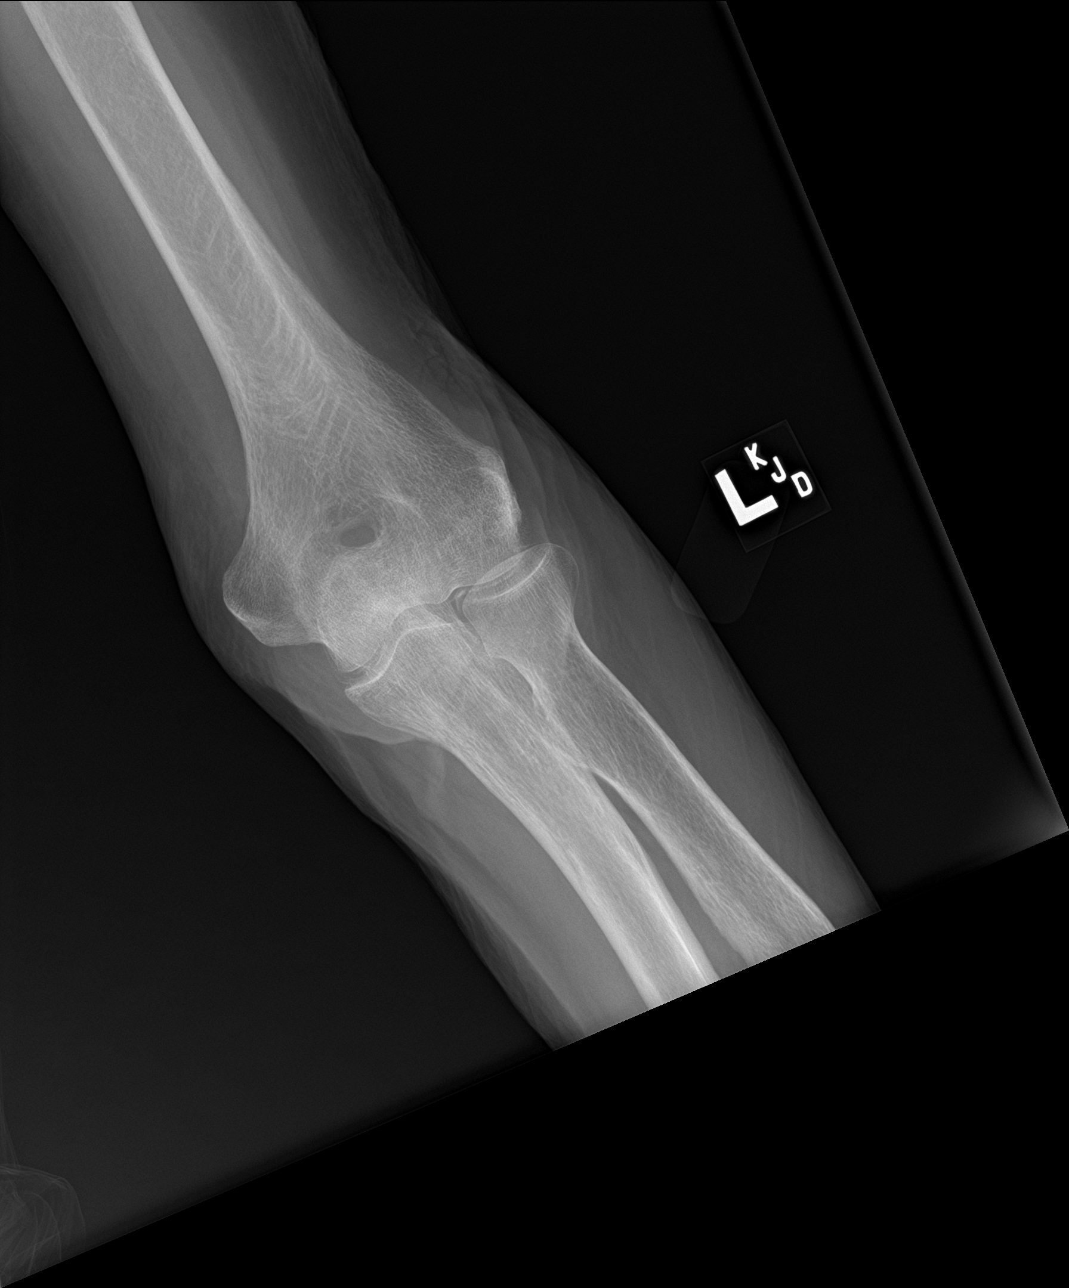

[elbow obl (1 of 2)]
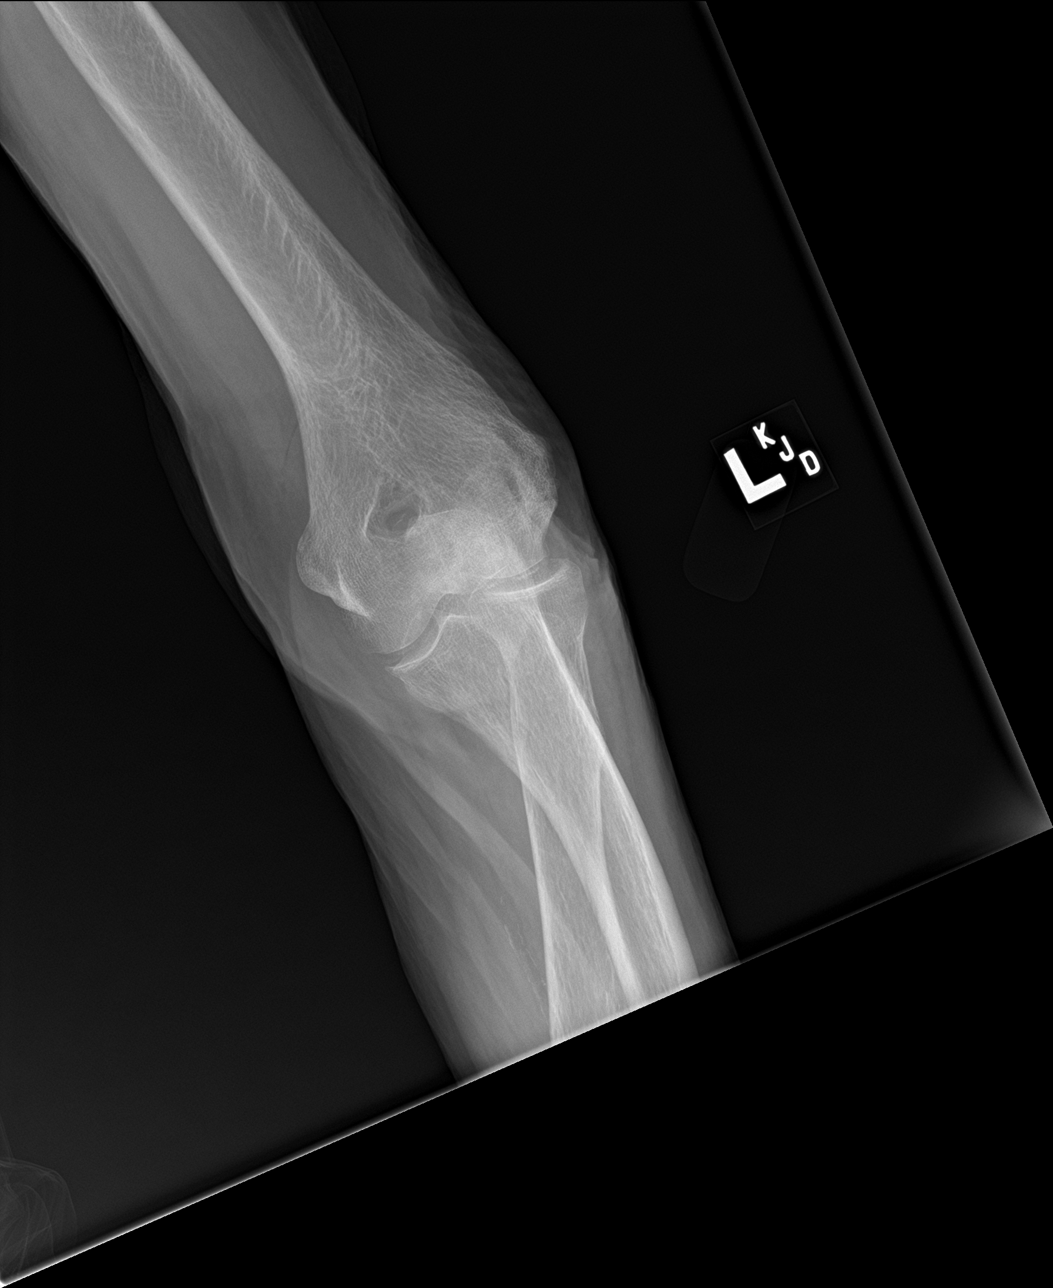

[elbow obl (2 of 2)]
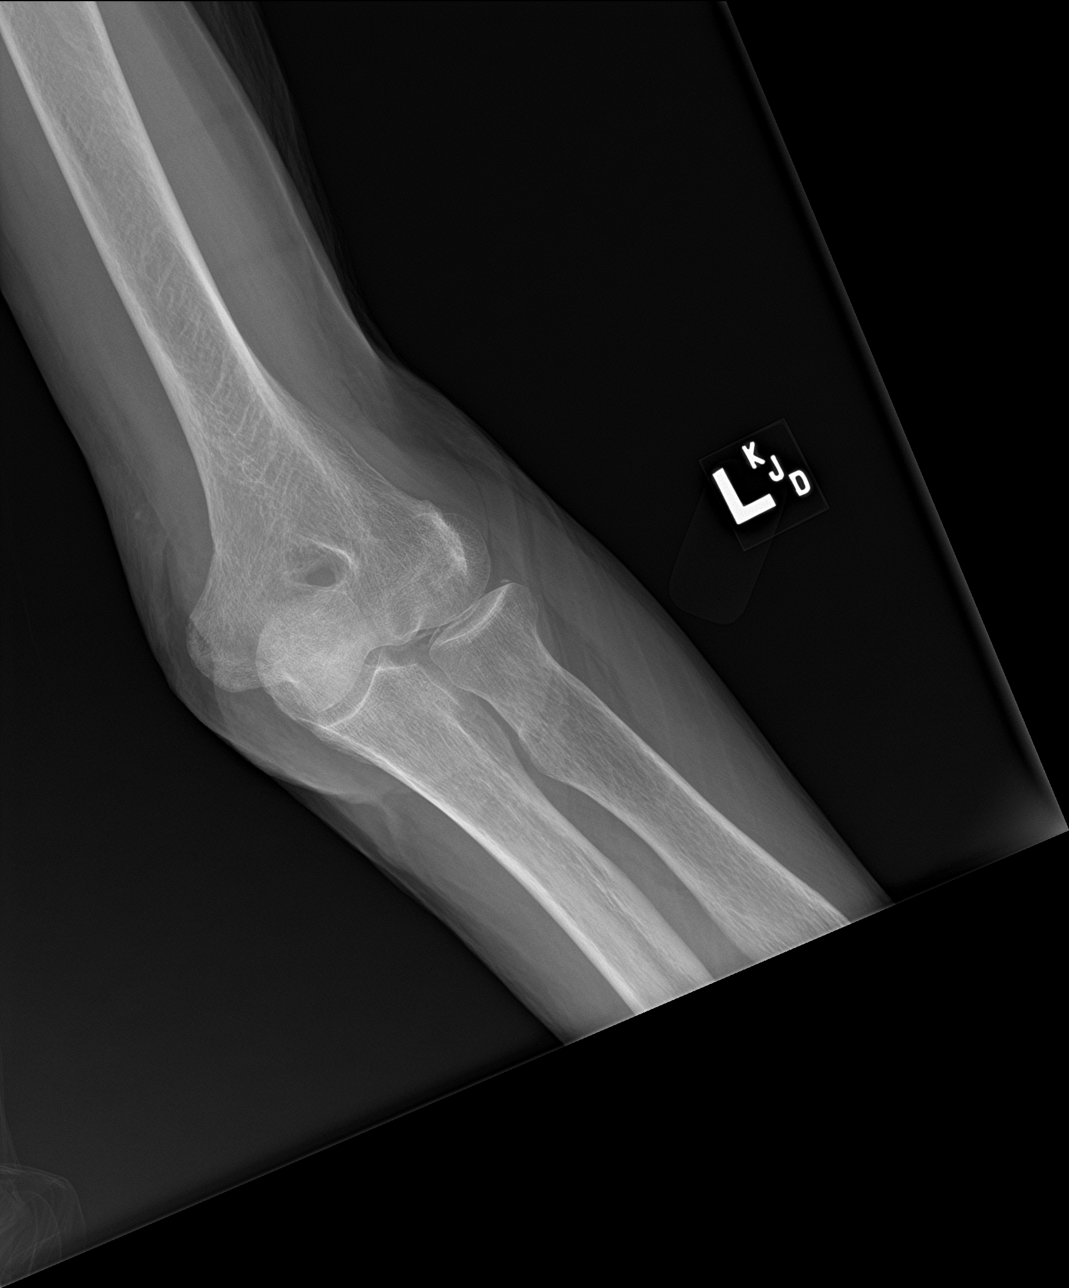

[elbow lat]
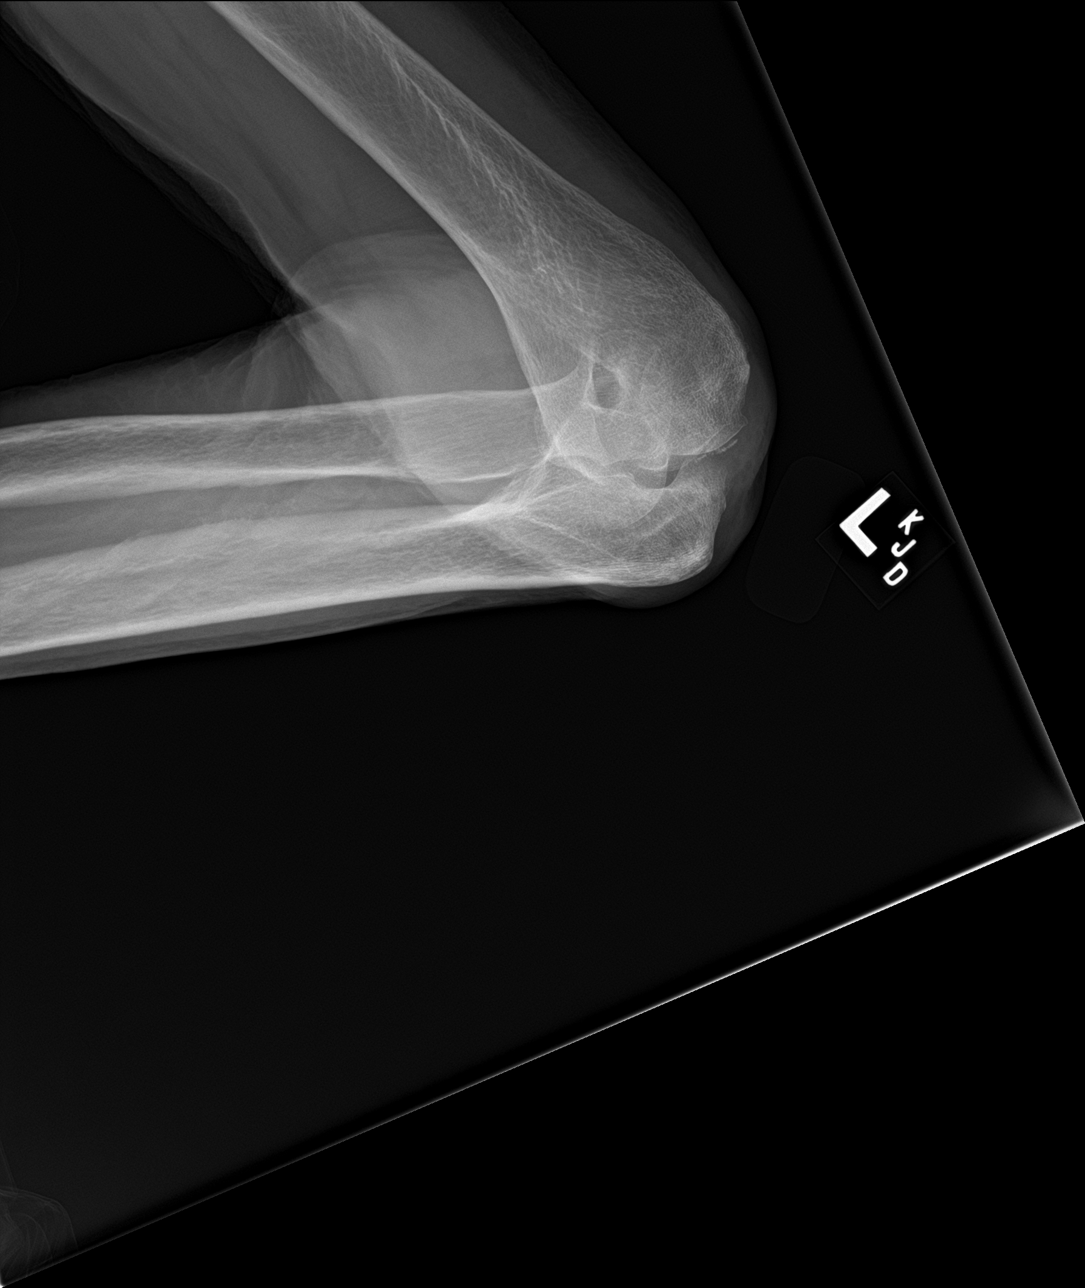

[4 of 4 positions shown; findings below may reference images not displayed]

FINDINGS: Normal alignment. No acute fracture or dislocation. Mild
chondrocalcinosis noted, likely degenerative in nature. Obliquity on
lateral examination precludes evaluation for an effusion. Mild soft
tissue swelling posterior to the olecranon.
IMPRESSION: Soft tissue swelling.  No acute fracture or dislocation.

## 2023-10-30 IMAGING — CR DG LUMBAR SPINE COMPLETE 4+V
5 series · 5 of 5 positions shown · non-contrast
Comparison: MRI 06/27/2018

CLINICAL DATA: Fall, left hip bruising, low back injury

EXAM:
LUMBAR SPINE - COMPLETE 4+ VIEW

[l-spine ap]
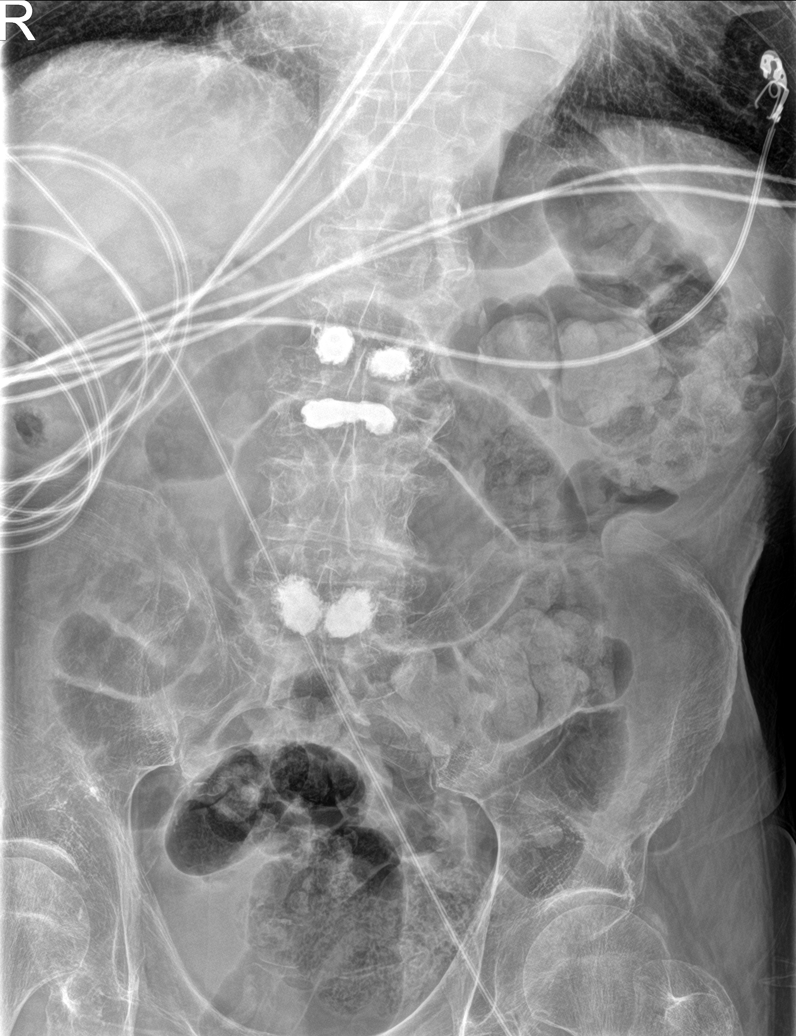

[l-spine obl (1 of 2)]
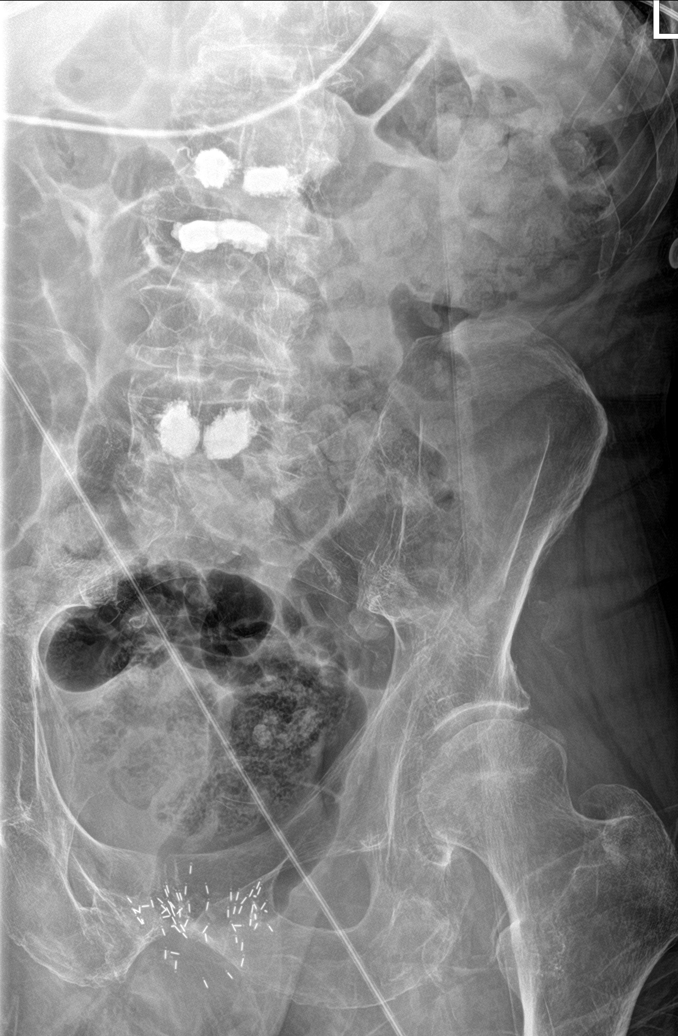

[l-spine obl (2 of 2)]
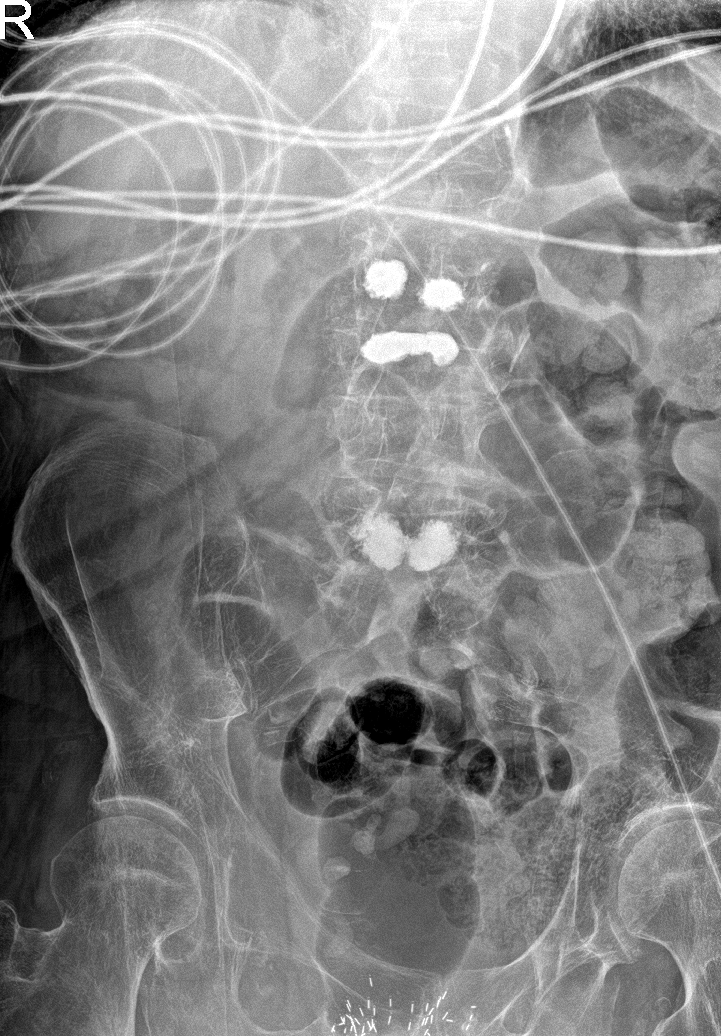

[l-spine lat]
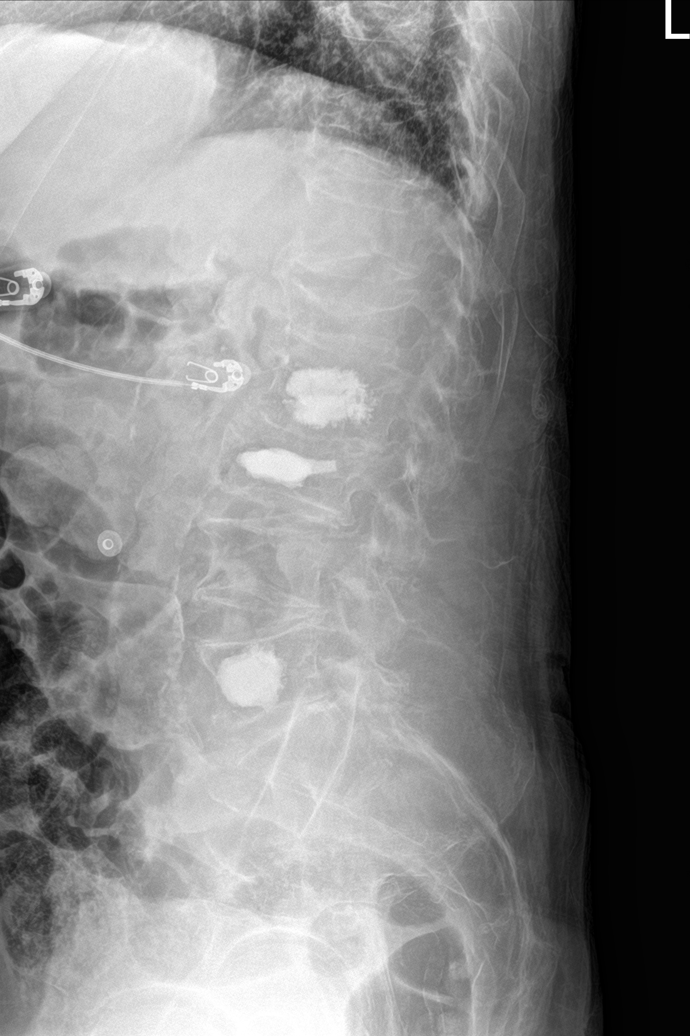

[l-spine spot]
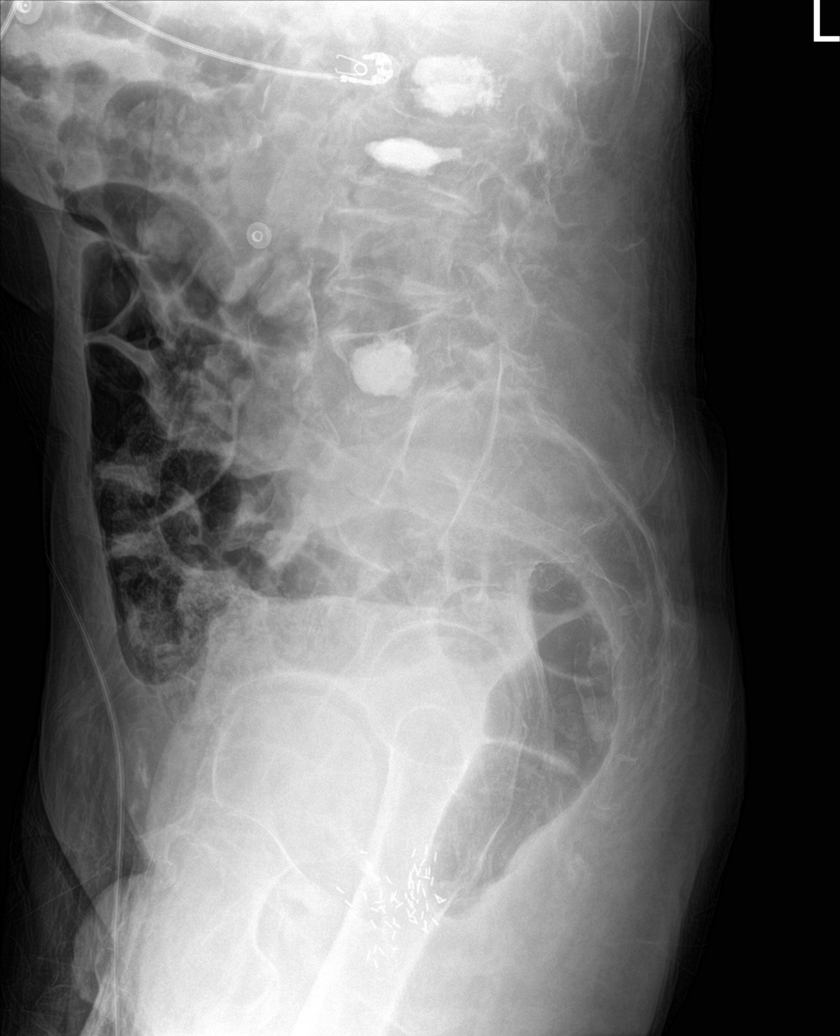

[5 of 5 positions shown; findings below may reference images not displayed]

FINDINGS: The osseous structures are diffusely markedly osteopenic. Interval
kyphoplasty of L2, L3, and L5. There is interval development of
moderate to severe anterior wedge compression fractures of T11, T12,
and L1, likely remote in nature given the lack of associated
paravertebral soft tissue swelling. No listhesis. The paraspinal
soft tissues are unremarkable save for vascular calcifications
within the abdominal aorta.
IMPRESSION: Multiple insufficiency fractures of the thoracolumbar spine, as
described above, likely remote in nature. No definite acute fracture
or listhesis.

Severe osteopenia.

## 2023-10-30 IMAGING — CR DG CHEST 1V
2 series · 2 of 2 positions shown · non-contrast
Comparison: 07/20/2013

CLINICAL DATA: Fall, chest pain

EXAM:
CHEST  1 VIEW

[chest ap (1 of 2)]
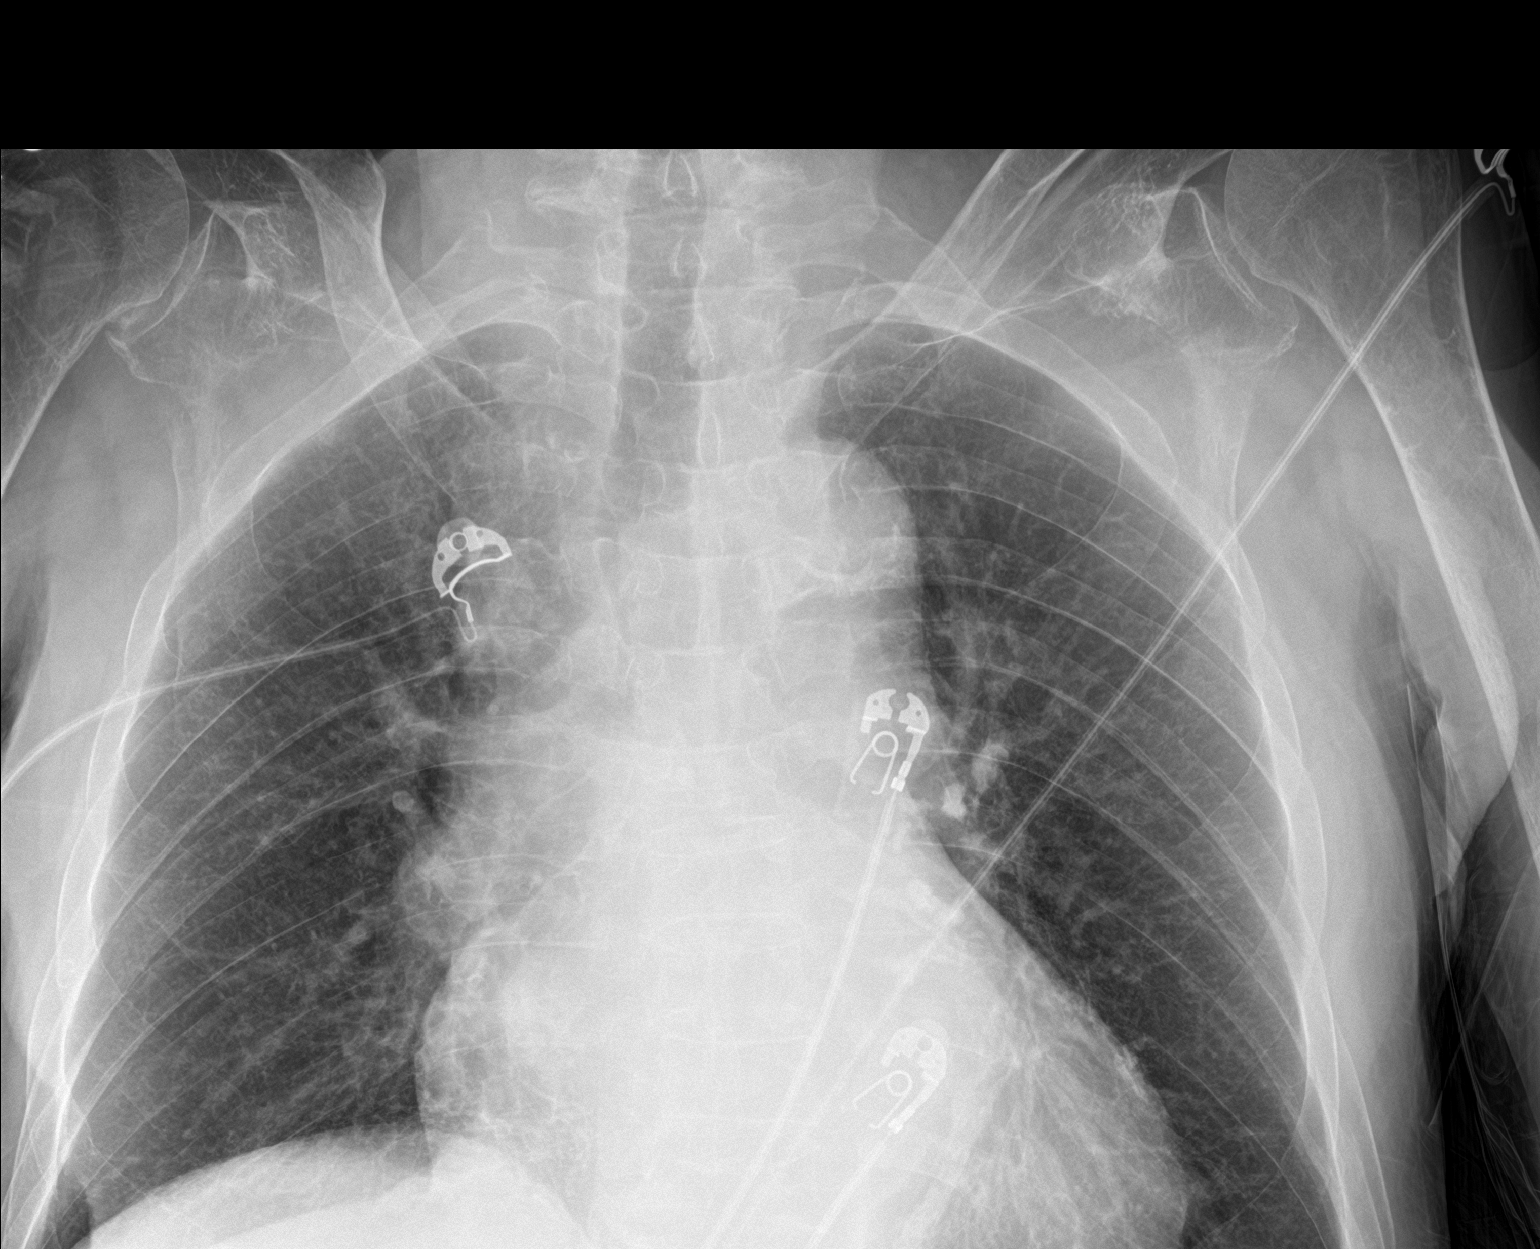

[chest ap (2 of 2)]
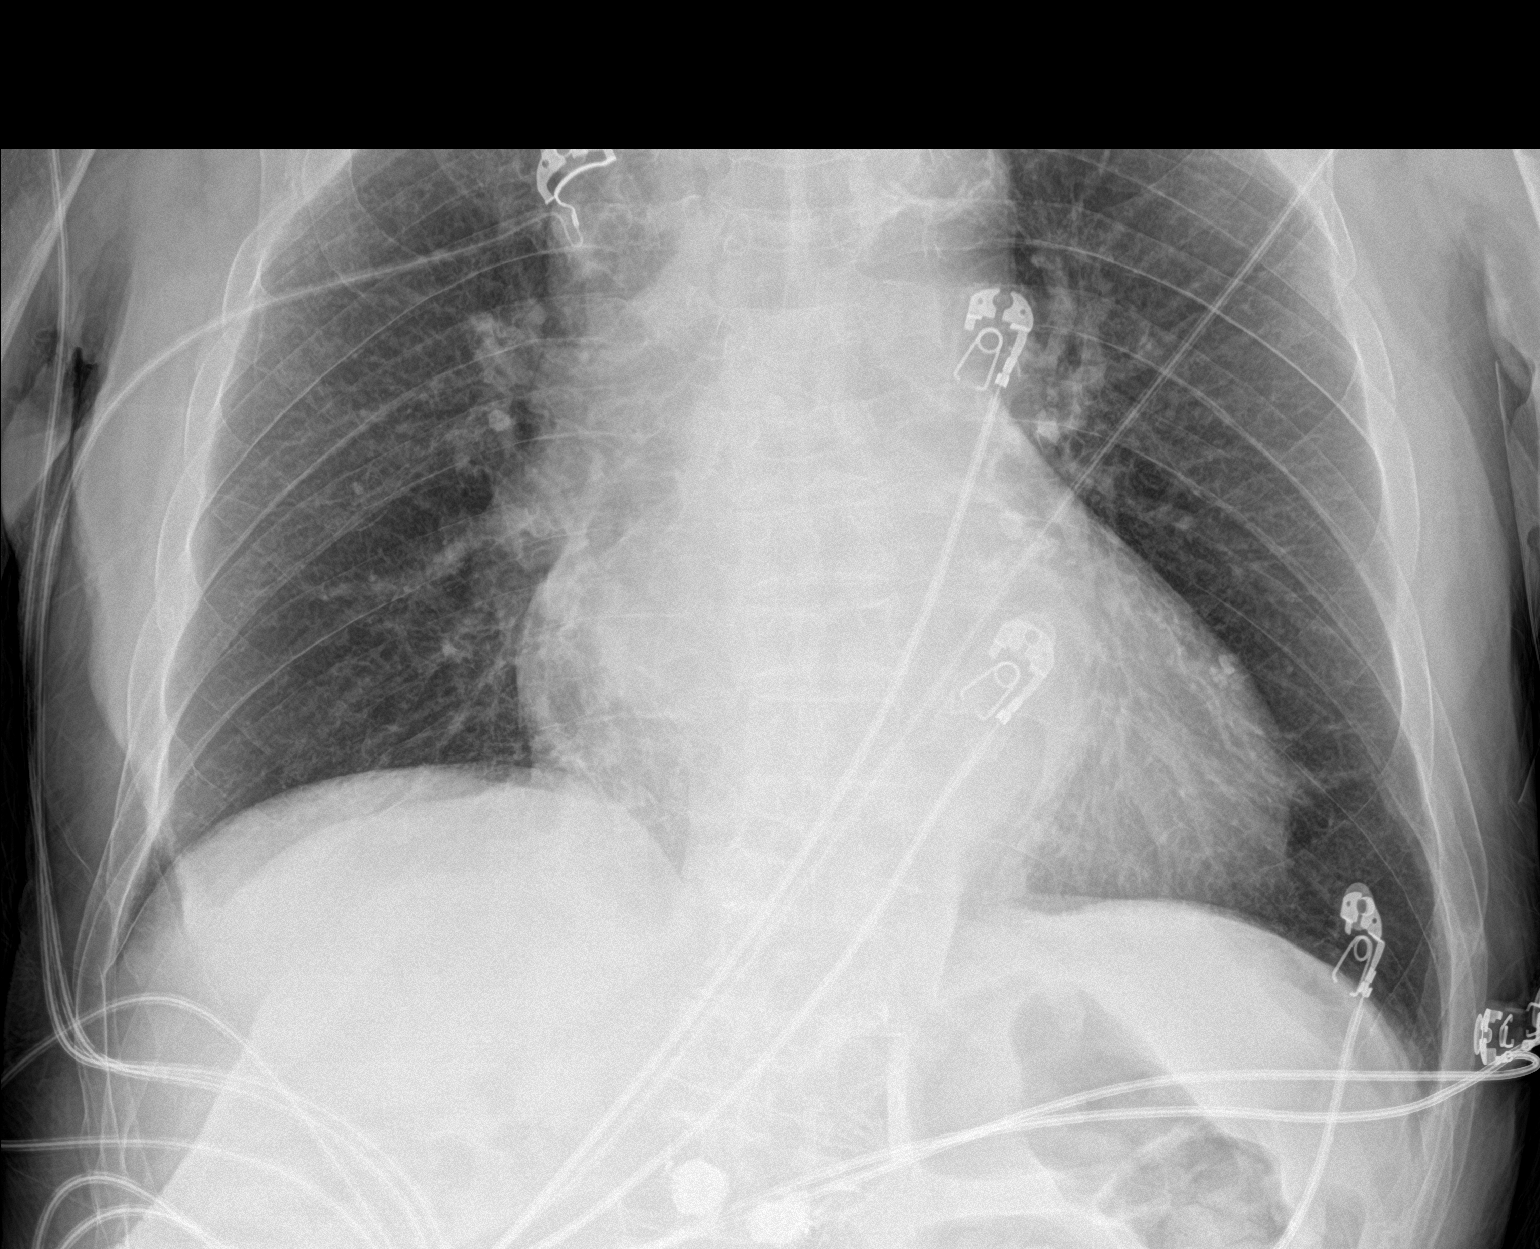

[2 of 2 positions shown; findings below may reference images not displayed]

FINDINGS: Lungs are clear. No pneumothorax or pleural effusion. Cardiac size
is within normal limits when accounting for supine positioning.
Pulmonary vascularity is normal. Multiple thoracic compression
deformities are noted.
IMPRESSION: Multiple thoracic compression deformities, age indeterminate.

No radiographic evidence of acute cardiopulmonary disease.

## 2023-11-03 DIAGNOSIS — E039 Hypothyroidism, unspecified: Secondary | ICD-10-CM | POA: Diagnosis not present

## 2023-11-03 DIAGNOSIS — D539 Nutritional anemia, unspecified: Secondary | ICD-10-CM | POA: Diagnosis not present

## 2023-11-03 DIAGNOSIS — E785 Hyperlipidemia, unspecified: Secondary | ICD-10-CM | POA: Diagnosis not present

## 2023-11-03 DIAGNOSIS — L89153 Pressure ulcer of sacral region, stage 3: Secondary | ICD-10-CM | POA: Diagnosis not present

## 2024-01-20 DIAGNOSIS — R2689 Other abnormalities of gait and mobility: Secondary | ICD-10-CM | POA: Diagnosis not present

## 2024-01-20 DIAGNOSIS — R197 Diarrhea, unspecified: Secondary | ICD-10-CM | POA: Diagnosis not present

## 2024-01-20 DIAGNOSIS — R41841 Cognitive communication deficit: Secondary | ICD-10-CM | POA: Diagnosis not present

## 2024-02-23 DIAGNOSIS — J9611 Chronic respiratory failure with hypoxia: Secondary | ICD-10-CM

## 2024-02-23 DIAGNOSIS — I480 Paroxysmal atrial fibrillation: Secondary | ICD-10-CM | POA: Diagnosis not present

## 2024-02-23 DIAGNOSIS — E785 Hyperlipidemia, unspecified: Secondary | ICD-10-CM | POA: Diagnosis not present

## 2024-02-23 DIAGNOSIS — L89610 Pressure ulcer of right heel, unstageable: Secondary | ICD-10-CM | POA: Diagnosis not present

## 2024-02-23 DIAGNOSIS — E039 Hypothyroidism, unspecified: Secondary | ICD-10-CM | POA: Diagnosis not present

## 2024-03-20 DEATH — deceased
